# Patient Record
Sex: Male | Born: 1935 | Race: White | Hispanic: No | Smoking: Former smoker
Health system: Southern US, Community
[De-identification: ages and names within clinical notes are randomized; demographics above are authoritative.]

## PROBLEM LIST (undated history)

## (undated) DIAGNOSIS — Z7901 Long term (current) use of anticoagulants: Secondary | ICD-10-CM

## (undated) DIAGNOSIS — R221 Localized swelling, mass and lump, neck: Secondary | ICD-10-CM

## (undated) DIAGNOSIS — R0609 Other forms of dyspnea: Secondary | ICD-10-CM

## (undated) DIAGNOSIS — M1990 Unspecified osteoarthritis, unspecified site: Secondary | ICD-10-CM

## (undated) DIAGNOSIS — H548 Legal blindness, as defined in USA: Secondary | ICD-10-CM

## (undated) DIAGNOSIS — J45909 Unspecified asthma, uncomplicated: Secondary | ICD-10-CM

## (undated) DIAGNOSIS — I38 Endocarditis, valve unspecified: Secondary | ICD-10-CM

## (undated) DIAGNOSIS — I700 Atherosclerosis of aorta: Secondary | ICD-10-CM

## (undated) DIAGNOSIS — N281 Cyst of kidney, acquired: Secondary | ICD-10-CM

## (undated) DIAGNOSIS — E785 Hyperlipidemia, unspecified: Secondary | ICD-10-CM

## (undated) DIAGNOSIS — I739 Peripheral vascular disease, unspecified: Secondary | ICD-10-CM

## (undated) DIAGNOSIS — R001 Bradycardia, unspecified: Secondary | ICD-10-CM

## (undated) DIAGNOSIS — I779 Disorder of arteries and arterioles, unspecified: Secondary | ICD-10-CM

## (undated) DIAGNOSIS — I714 Abdominal aortic aneurysm, without rupture, unspecified: Secondary | ICD-10-CM

## (undated) DIAGNOSIS — I509 Heart failure, unspecified: Secondary | ICD-10-CM

## (undated) DIAGNOSIS — I639 Cerebral infarction, unspecified: Secondary | ICD-10-CM

## (undated) DIAGNOSIS — H409 Unspecified glaucoma: Secondary | ICD-10-CM

## (undated) DIAGNOSIS — C4492 Squamous cell carcinoma of skin, unspecified: Secondary | ICD-10-CM

## (undated) DIAGNOSIS — I2510 Atherosclerotic heart disease of native coronary artery without angina pectoris: Secondary | ICD-10-CM

## (undated) DIAGNOSIS — M5136 Other intervertebral disc degeneration, lumbar region: Secondary | ICD-10-CM

## (undated) DIAGNOSIS — J189 Pneumonia, unspecified organism: Secondary | ICD-10-CM

## (undated) DIAGNOSIS — K219 Gastro-esophageal reflux disease without esophagitis: Secondary | ICD-10-CM

## (undated) DIAGNOSIS — I10 Essential (primary) hypertension: Secondary | ICD-10-CM

## (undated) DIAGNOSIS — R0600 Dyspnea, unspecified: Secondary | ICD-10-CM

## (undated) DIAGNOSIS — M51369 Other intervertebral disc degeneration, lumbar region without mention of lumbar back pain or lower extremity pain: Secondary | ICD-10-CM

## (undated) HISTORY — DX: Essential (primary) hypertension: I10

## (undated) HISTORY — DX: Unspecified glaucoma: H40.9

## (undated) HISTORY — DX: Unspecified osteoarthritis, unspecified site: M19.90

## (undated) HISTORY — DX: Hyperlipidemia, unspecified: E78.5

## (undated) HISTORY — PX: EYE SURGERY: SHX253

## (undated) HISTORY — PX: TUMOR REMOVAL: SHX12

## (undated) HISTORY — PX: OTHER SURGICAL HISTORY: SHX169

## (undated) HISTORY — DX: Squamous cell carcinoma of skin, unspecified: C44.92

## (undated) HISTORY — DX: Cerebral infarction, unspecified: I63.9

---

## 1975-03-10 HISTORY — PX: TUMOR REMOVAL: SHX12

## 2001-03-09 HISTORY — PX: GLAUCOMA SURGERY: SHX656

## 2005-03-09 HISTORY — PX: INGUINAL HERNIA REPAIR: SUR1180

## 2006-05-08 HISTORY — PX: CERVICAL LAMINECTOMY: SHX94

## 2006-06-01 ENCOUNTER — Inpatient Hospital Stay (HOSPITAL_COMMUNITY): Admission: RE | Admit: 2006-06-01 | Discharge: 2006-06-04 | Payer: Self-pay | Admitting: Neurosurgery

## 2006-06-02 ENCOUNTER — Ambulatory Visit: Payer: Self-pay | Admitting: Thoracic Surgery

## 2006-06-03 DIAGNOSIS — K828 Other specified diseases of gallbladder: Secondary | ICD-10-CM

## 2006-06-03 HISTORY — DX: Other specified diseases of gallbladder: K82.8

## 2006-06-23 ENCOUNTER — Ambulatory Visit: Payer: Self-pay | Admitting: Thoracic Surgery

## 2006-07-01 ENCOUNTER — Ambulatory Visit (HOSPITAL_COMMUNITY): Admission: RE | Admit: 2006-07-01 | Discharge: 2006-07-01 | Payer: Self-pay | Admitting: Thoracic Surgery

## 2006-07-05 ENCOUNTER — Encounter (INDEPENDENT_AMBULATORY_CARE_PROVIDER_SITE_OTHER): Payer: Self-pay | Admitting: Diagnostic Radiology

## 2006-07-05 ENCOUNTER — Encounter (INDEPENDENT_AMBULATORY_CARE_PROVIDER_SITE_OTHER): Payer: Self-pay | Admitting: Specialist

## 2006-07-05 ENCOUNTER — Ambulatory Visit (HOSPITAL_COMMUNITY): Admission: RE | Admit: 2006-07-05 | Discharge: 2006-07-05 | Payer: Self-pay | Admitting: Thoracic Surgery

## 2006-07-07 ENCOUNTER — Ambulatory Visit: Payer: Self-pay | Admitting: Thoracic Surgery

## 2006-07-27 ENCOUNTER — Ambulatory Visit: Payer: Self-pay | Admitting: Thoracic Surgery

## 2006-08-03 ENCOUNTER — Encounter: Admission: RE | Admit: 2006-08-03 | Discharge: 2006-08-03 | Payer: Self-pay | Admitting: Thoracic Surgery

## 2006-08-04 ENCOUNTER — Ambulatory Visit: Payer: Self-pay | Admitting: Thoracic Surgery

## 2006-08-12 ENCOUNTER — Ambulatory Visit (HOSPITAL_COMMUNITY): Admission: RE | Admit: 2006-08-12 | Discharge: 2006-08-12 | Payer: Self-pay | Admitting: Neurosurgery

## 2009-07-05 ENCOUNTER — Inpatient Hospital Stay: Payer: Self-pay | Admitting: Vascular Surgery

## 2009-07-07 ENCOUNTER — Ambulatory Visit: Payer: Self-pay | Admitting: Internal Medicine

## 2009-07-08 ENCOUNTER — Ambulatory Visit: Payer: Self-pay | Admitting: Internal Medicine

## 2009-07-19 ENCOUNTER — Emergency Department: Payer: Self-pay | Admitting: Emergency Medicine

## 2009-07-22 ENCOUNTER — Ambulatory Visit: Payer: Self-pay | Admitting: Oncology

## 2009-07-26 ENCOUNTER — Ambulatory Visit: Payer: Self-pay | Admitting: Oncology

## 2009-08-07 ENCOUNTER — Ambulatory Visit: Payer: Self-pay | Admitting: Internal Medicine

## 2009-08-09 ENCOUNTER — Ambulatory Visit: Payer: Self-pay | Admitting: Vascular Surgery

## 2009-08-14 ENCOUNTER — Ambulatory Visit: Payer: Self-pay | Admitting: Oncology

## 2009-08-21 ENCOUNTER — Inpatient Hospital Stay: Payer: Self-pay | Admitting: Vascular Surgery

## 2010-02-13 ENCOUNTER — Emergency Department: Payer: Self-pay | Admitting: Emergency Medicine

## 2010-02-14 ENCOUNTER — Emergency Department: Payer: Self-pay | Admitting: Emergency Medicine

## 2010-02-16 ENCOUNTER — Emergency Department: Payer: Self-pay | Admitting: Unknown Physician Specialty

## 2010-02-17 ENCOUNTER — Emergency Department: Payer: Self-pay | Admitting: Emergency Medicine

## 2010-07-22 NOTE — Letter (Signed)
Aug 04, 2006     Re:  Steve Bryant, Steve Bryant             DOB:  1925-12-16     Dr. Dorothey Baseman  [address]  [place], [place] [address]   Dear Dr. Terance Hart:   I saw Steve Bryant back in the office today.  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  There is no spinal cord impingement  at present.  There is another small lesion just inferior to the large  lesion.This was all evidenced on the MRI.  I am afraid he is going to  have to have resective surgery of this neurilemoma.  I will contact his  neurosurgeon, Dr. Jeral Fruit, and have him review the films.  He still is  weak from his previous neck surgery so I will plan to wait at least  another month before proceeding with surgery.With the neuroforamen  impingement and possible spinal cord compromise, I do not see any other  option but to repeat his surgery despite his age.  I will see him back  again in one month.  His blood pressure is 179/80.  Pulse 59.  Respirations 18.  Saturations were 98%.   Ines Bloomer, M.D.  Electronically Signed   DPB/MEDQ  D:  08/04/2006  T:  08/04/2006  Job:  [id number]   cc:   Hilda Lias, M.D.

## 2010-07-25 NOTE — Discharge Summary (Signed)
NAME:  Steve Bryant, Steve Bryant NO.:  [id number]   MEDICAL RECORD NO.:  [id number]          PATIENT TYPE:  INP   LOCATION:  3005                         FACILITY:  [facility]   PHYSICIAN:  Hilda Lias, M.D.   DATE OF BIRTH:  18-Mar-1925   DATE OF ADMISSION:  06/01/2006  DATE OF DISCHARGE:  06/04/2006                               DISCHARGE SUMMARY   ADMISSION DIAGNOSES:  1. Cervical stenosis with myelopathy.  2. Lung tumor.   DISCHARGE DIAGNOSES:  1. Cervical stenosis with myelopathy.  2. Lung tumor.   CLINICAL HISTORY:  Patient was seen in [place] after he developed  weakness walking.  X-ray showed that he has severe stenosis at the level  of C3, 4-5, 5-6, 6-7.  Patient was advised to have surgery and he  decided to come to [facility].  Also, he was found to have a mass in  the lung.   LABORATORY:  Normal.   COURSE IN THE HOSPITAL:  Patient was taken to surgery and decompression  and fusion from C3-C7 was done.  Patient did really well after the  surgery up to the point of right now have no pain whatsoever.  He had  been seen by Dr. Edwyna Shell and now he is going to followup because of the  chest tumor.  Today, he is ready to go home.   CONDITION ON DISCHARGE:  Improving.   MEDICATIONS:  Diazepam and Percocet.   DIET:  Regular.   ACTIVITY:  Not to drive for at least 2 weeks.   FOLLOWUP:  He is going to be seen by me in 3 weeks.  He has an  outpatient appointment with Dr. Edwyna Shell.           ______________________________  Hilda Lias, M.D.     EB/MEDQ  D:  06/04/2006  T:  06/04/2006  Job:  [id number]

## 2010-07-25 NOTE — Op Note (Signed)
NAME:  Steve Bryant, STUCK NO.:  [id number]   MEDICAL RECORD NO.:  [id number]          PATIENT TYPE:  INP   LOCATION:  2899                         FACILITY:  [facility]   PHYSICIAN:  Hilda Lias, M.D.   DATE OF BIRTH:  May 05, 1925   DATE OF PROCEDURE:  06/01/2006  DATE OF DISCHARGE:                               OPERATIVE REPORT   PREOPERATIVE DIAGNOSIS:  Cervical stenosis C3-4, 4-5, 5-6 and 6-7 with  cervical myelopathy.   POSTOPERATIVE DIAGNOSES:  Cervical stenosis C3-4, 4-5, 5-6 and 6-7 with  cervical myelopathy.   PROCEDURE:  Decompression of 3-4, 4-5, 5-6 and 6-7.  Interbody fusion  with allograft.  Plate from C3 to C7.  Microscope.   SURGEON:  Hilda Lias, M.D.   ASSISTANT:  Hewitt Shorts, M.D.    The patient was admitted because of difficulty walking.  The patient had  this episode while he was in [place].  He was seen at the local hospital  and he was advised about the cervical stenosis and the need for surgery.  Because he did not it, he decided to come to [place].  The risks were  explained to him and the rest of the family.   PROCEDURE:  [name] was taken to the OR and intubation was done in  lateral position.  The neck was cleaned with DuraPrep.  Longitudinal  incision was made through the skin and subcutaneous tissue.  Dissection  was carried out all the way down to the cervical area.  The patient had  large osteophytes which were removed with the osteophyte __________ .  X-  rays showed that we were at the level of 4-5.  From then on we opened  the anterior ligament at the level of 3-4 and 4-5.  The __________ one  was at the level of 5-6 and 6-7 where the ligament was calcified.  Nevertheless because it probably was mostly at the level of 3-4 and 4-5  we started at that level.  With the microscope we did a total  diskectomy.  We opened the posterior ligament and decompressed the  spinal cord as well at the C4 nerve root was achieved  bilaterally.  The  endplate were drilled.  The same procedure was done at the level of 4-5  where we found a combination of degenerative disk plus osteophyte.  Decompression of the spinal cord as well C5 nerve root was achieved.  At  the level of 5-6 and 6-7 the patient has calcification of the ligament.  We had to drill to get into the disk space.  The patient had almost no  disk at the level.  At the end we had good decompression of the spinal  cord at the level of 5-6 and 6-7 with good foramen.  Then one piece of  iliac crest was cut to tailor for graft about 7 mm in height.  They were  inserted at those four levels and this was followed by 10 screws.  Four  of them of 15 mm at the level of C3 and C7 and the rest 13 mm in  between.  X-rays showed good position of the graft and the plate at the  level of 3-4 and 4-5 although was difficult to see below.  From then on,  the area was irrigated.  Hemostasis was accomplished.  Nevertheless  because of the larger dissection, a drain was left in the precervical  area.  The wound was closed with Vicryl and Steri-Strips.          ______________________________  Hilda Lias, M.D.    EB/MEDQ  D:  06/01/2006  T:  06/01/2006  Job:  [id number]

## 2012-01-18 ENCOUNTER — Emergency Department: Payer: Self-pay | Admitting: Emergency Medicine

## 2012-02-27 ENCOUNTER — Emergency Department: Payer: Self-pay | Admitting: Emergency Medicine

## 2012-02-27 LAB — RAPID INFLUENZA A&B ANTIGENS

## 2013-05-18 DIAGNOSIS — N289 Disorder of kidney and ureter, unspecified: Secondary | ICD-10-CM | POA: Insufficient documentation

## 2013-05-18 DIAGNOSIS — J45909 Unspecified asthma, uncomplicated: Secondary | ICD-10-CM | POA: Insufficient documentation

## 2013-05-18 DIAGNOSIS — M1990 Unspecified osteoarthritis, unspecified site: Secondary | ICD-10-CM | POA: Insufficient documentation

## 2013-05-18 DIAGNOSIS — G629 Polyneuropathy, unspecified: Secondary | ICD-10-CM | POA: Insufficient documentation

## 2013-05-18 DIAGNOSIS — G609 Hereditary and idiopathic neuropathy, unspecified: Secondary | ICD-10-CM | POA: Insufficient documentation

## 2013-05-18 DIAGNOSIS — M47812 Spondylosis without myelopathy or radiculopathy, cervical region: Secondary | ICD-10-CM | POA: Insufficient documentation

## 2013-08-21 ENCOUNTER — Ambulatory Visit: Payer: Self-pay | Admitting: Physical Medicine and Rehabilitation

## 2013-10-02 DIAGNOSIS — M5416 Radiculopathy, lumbar region: Secondary | ICD-10-CM | POA: Insufficient documentation

## 2013-10-02 DIAGNOSIS — M5136 Other intervertebral disc degeneration, lumbar region: Secondary | ICD-10-CM | POA: Insufficient documentation

## 2013-11-22 DIAGNOSIS — Z8709 Personal history of other diseases of the respiratory system: Secondary | ICD-10-CM | POA: Insufficient documentation

## 2013-11-22 DIAGNOSIS — I495 Sick sinus syndrome: Secondary | ICD-10-CM | POA: Insufficient documentation

## 2016-03-10 ENCOUNTER — Other Ambulatory Visit: Payer: Self-pay | Admitting: Family Medicine

## 2016-03-10 DIAGNOSIS — Z8679 Personal history of other diseases of the circulatory system: Secondary | ICD-10-CM

## 2016-03-12 ENCOUNTER — Ambulatory Visit
Admission: RE | Admit: 2016-03-12 | Discharge: 2016-03-12 | Disposition: A | Discharge status: Home / Self Care | Payer: Medicare Other | Source: Ambulatory Visit | Attending: Family Medicine | Admitting: Family Medicine

## 2016-03-12 DIAGNOSIS — K8020 Calculus of gallbladder without cholecystitis without obstruction: Secondary | ICD-10-CM | POA: Diagnosis not present

## 2016-03-12 DIAGNOSIS — N281 Cyst of kidney, acquired: Secondary | ICD-10-CM | POA: Diagnosis not present

## 2016-03-12 DIAGNOSIS — I714 Abdominal aortic aneurysm, without rupture: Secondary | ICD-10-CM | POA: Insufficient documentation

## 2016-03-12 DIAGNOSIS — Z8679 Personal history of other diseases of the circulatory system: Secondary | ICD-10-CM | POA: Diagnosis present

## 2016-08-26 DIAGNOSIS — R2681 Unsteadiness on feet: Secondary | ICD-10-CM | POA: Insufficient documentation

## 2016-09-18 ENCOUNTER — Encounter (INDEPENDENT_AMBULATORY_CARE_PROVIDER_SITE_OTHER): Payer: Self-pay | Admitting: Vascular Surgery

## 2016-09-18 ENCOUNTER — Ambulatory Visit (INDEPENDENT_AMBULATORY_CARE_PROVIDER_SITE_OTHER): Payer: Medicare Other | Admitting: Vascular Surgery

## 2016-09-18 VITALS — BP 125/70 | HR 54 | Resp 16 | Ht 69.5 in | Wt 150.0 lb

## 2016-09-18 DIAGNOSIS — I714 Abdominal aortic aneurysm, without rupture, unspecified: Secondary | ICD-10-CM

## 2016-09-18 DIAGNOSIS — I639 Cerebral infarction, unspecified: Secondary | ICD-10-CM

## 2016-09-18 DIAGNOSIS — I10 Essential (primary) hypertension: Secondary | ICD-10-CM | POA: Diagnosis not present

## 2016-09-18 NOTE — Assessment & Plan Note (Signed)
CT scan performed in [facility] which I have the disc and have independently reviewed. This demonstrates an approximately 4.5 cm infrarenal abdominal aortic aneurysm. This was a noncontrasted CT scan so its visualization was quite limited. The aneurysm was very calcific and begin several centimeters below the renal arteries. We've had a long discussion about the natural history and pathophysiology of aneurysms. Particularly given his advanced age, this is below the threshold size for prophylactic repair. It does however given the range where close surveillance is necessary and given what appears to be a recent growth a short interval follow-up at 3 months as planned. I have discussed repair both endovascular and open options. Given his age, an endovascular option would obviously be preferred if this grows to a size that needs to be repaired. He does not smoke and we have discussed blood pressure control is being important.

## 2016-09-18 NOTE — Assessment & Plan Note (Signed)
Remote, no recent symptoms.

## 2016-09-18 NOTE — Progress Notes (Signed)
Patient ID: Steve Bryant, male   DOB: 1925/06/29, 83 y.o.   MRN: [id number]  Chief Complaint  Patient presents with  . New Evaluation    AAA    HPI Steve Bryant is a 83 y.o. male.  I am asked to see the patient by Dr. Lovie Macadamia for evaluation of AAA.  The patient reports knowing about the AAA for 5 years or so.  This had been followed by his primary care physician but he recently had a CT scan performed in [facility] which I have the disc and have independently reviewed. This demonstrates an approximately 4.5 cm infrarenal abdominal aortic aneurysm. This was a noncontrasted CT scan so its visualization was quite limited. The aneurysm was very calcific and begin several centimeters below the renal arteries. He denies any current aneurysm related symptoms. Specifically, the patient denies new back or abdominal pain, or signs of peripheral embolization. He does not smoke. His biggest issue currently is his balance and his vision which is very poor. He reports that he was told for many years that the aneurysm was "small" and this recent CT scan showed some increase in size. He is not sure how much.   Past Medical History:  Diagnosis Date  . Arthritis   . Glaucoma   . Hypertension   . Stroke [facility])     Past Surgical History:  Procedure Laterality Date  . EYE SURGERY    . neck fusion    . TUMOR REMOVAL      Family History  Problem Relation Age of Onset  . Stroke Mother   . Stroke Father   . Cancer Brother   No aneurysms or bleeding disorders  Social History Social History  Substance Use Topics  . Smoking status: Former Research scientist (life sciences)  . Smokeless tobacco: Never Used  . Alcohol use Yes     Comment: occasionally  No IVDU  Allergies  Allergen Reactions  . Shellfish-Derived Products Nausea Only    Scallops    Current Outpatient Prescriptions  Medication Sig Dispense Refill  . amLODipine (NORVASC) 2.5 MG tablet Take 2.5 mg by mouth daily.  3  . aspirin EC 81 MG tablet  Take by mouth.    . dorzolamide-timolol (COSOPT) 22.3-6.8 MG/ML ophthalmic solution INSTILL ONE DROP TWICE DAILY TO BOTH EYES  2  . latanoprost (XALATAN) 0.005 % ophthalmic solution USE 1 DROP IN THE RIGHT EYE AT BEDTIME AS DIRECTED  2  . losartan (COZAAR) 50 MG tablet TAKE 1 TABLET (50 MG TOTAL) BY MOUTH ONCE DAILY.  3  . LYRICA 75 MG capsule Take 75 mg by mouth 3 (three) times daily.  0  . Multiple Vitamin (MULTIVITAMIN) tablet Take 1 tablet by mouth daily.     No current facility-administered medications for this visit.       REVIEW OF SYSTEMS (Negative unless checked)  Constitutional: [] Weight loss  [] Fever  [] Chills Cardiac: [] Chest pain   [] Chest pressure   [] Palpitations   [] Shortness of breath when laying flat   [] Shortness of breath at rest   [] Shortness of breath with exertion. Vascular:  [] Pain in legs with walking   [] Pain in legs at rest   [] Pain in legs when laying flat   [] Claudication   [] Pain in feet when walking  [] Pain in feet at rest  [] Pain in feet when laying flat   [] History of DVT   [] Phlebitis   [] Swelling in legs   [] Varicose veins   [] Non-healing ulcers Pulmonary:   []   Uses home oxygen   [] Productive cough   [] Hemoptysis   [] Wheeze  [] COPD   [] Asthma Neurologic:  [] Dizziness  [] Blackouts   [] Seizures   [x] History of stroke   [] History of TIA  [] Aphasia   [] Temporary blindness   [] Dysphagia   [] Weakness or numbness in arms   [] Weakness or numbness in legs X positive for markedly diminished vision Musculoskeletal:  [x] Arthritis   [] Joint swelling   [] Joint pain   [] Low back pain Hematologic:  [] Easy bruising  [] Easy bleeding   [] Hypercoagulable state   [] Anemic  [] Hepatitis Gastrointestinal:  [] Blood in stool   [] Vomiting blood  [] Gastroesophageal reflux/heartburn   [] Abdominal pain Genitourinary:  [] Chronic kidney disease   [] Difficult urination  [] Frequent urination  [] Burning with urination   [] Hematuria Skin:  [] Rashes   [] Ulcers   [] Wounds Psychological:   [] History of anxiety   []  History of major depression.    Physical Exam BP 125/70   Pulse (!) 54   Resp 16   Ht 5' 9.5" (1.765 m)   Wt 150 lb (68 kg)   BMI 21.83 kg/m  Gen:  WD/WN, NAD Head: [place]/AT, No temporalis wasting.  Ear/Nose/Throat: Hearing grossly intact, nares w/o erythema or drainage, oropharynx w/o Erythema/Exudate Eyes: Conjunctiva clear, sclera non-icteric. Visual acuity is poor Neck: trachea midline.  No JVD.  Pulmonary:  Good air movement, clear to auscultation bilaterally.  Cardiac: RRR Vascular:  Vessel Right Left  Radial Palpable Palpable  Ulnar Palpable Palpable  Brachial Palpable Palpable  Carotid Palpable, without bruit Palpable, without bruit  Aorta Palpable N/A  Femoral Palpable Palpable  Popliteal 1+ Palpable 1+ Palpable  PT Palpable Not Palpable  DP 1+ Palpable Palpable   Gastrointestinal: soft, non-tender/non-distended. No guarding/reflex. Some increased aortic impulse is present Musculoskeletal: M/S 5/5 throughout.  Extremities without ischemic changes.  No deformity or atrophy. Neurologic: Sensation grossly intact in extremities.  Symmetrical.  Speech is fluent. Motor exam as listed above. Psychiatric: Judgment intact, Mood & affect appropriate for pt's clinical situation. Dermatologic: No rashes or ulcers noted.  No cellulitis or open wounds.   Data Reviewed Outside CT scan independently reviewed and does demonstrate a 4.5 cm aneurysm  Radiology No results found.  Labs No results found for this or any previous visit (from the past 2160 hour(s)).  Assessment/Plan:  Essential hypertension, benign blood pressure control important in reducing the progression of atherosclerotic disease and aneurysmal degeneration. On appropriate oral medications.   Stroke [facility]) Remote, no recent symptoms.  AAA (abdominal aortic aneurysm) without rupture [facility]) CT scan performed in [facility] which I have the disc and have independently reviewed. This  demonstrates an approximately 4.5 cm infrarenal abdominal aortic aneurysm. This was a noncontrasted CT scan so its visualization was quite limited. The aneurysm was very calcific and begin several centimeters below the renal arteries. We've had a long discussion about the natural history and pathophysiology of aneurysms. Particularly given his advanced age, this is below the threshold size for prophylactic repair. It does however given the range where close surveillance is necessary and given what appears to be a recent growth a short interval follow-up at 3 months as planned. I have discussed repair both endovascular and open options. Given his age, an endovascular option would obviously be preferred if this grows to a size that needs to be repaired. He does not smoke and we have discussed blood pressure control is being important.      Leotis Pain 09/18/2016, 11:08 AM   This note was created  with Dragon medical transcription system.  Any errors from dictation are unintentional.

## 2016-09-18 NOTE — Assessment & Plan Note (Signed)
blood pressure control important in reducing the progression of atherosclerotic disease and aneurysmal degeneration. On appropriate oral medications.  

## 2016-09-18 NOTE — Patient Instructions (Signed)
Abdominal Aortic Aneurysm Blood pumps away from the heart through tubes (blood vessels) called arteries. Aneurysms are weak or damaged places in the wall of an artery. It bulges out like a balloon. An abdominal aortic aneurysm happens in the main artery of the body (aorta). It can burst or tear, causing bleeding inside the body. This is an emergency. It needs treatment right away. What are the causes? The exact cause is unknown. Things that could cause this problem include:  Fat and other substances building up in the lining of a tube.  Swelling of the walls of a blood vessel.  Certain tissue diseases.  Belly (abdominal) trauma.  An infection in the main artery of the body.  What increases the risk? There are things that make it more likely for you to have an aneurysm. These include:  Being over the age of 81 years old.  Having high blood pressure (hypertension).  Being a male.  Being white.  Being very overweight (obese).  Having a family history of aneurysm.  Using tobacco products.  What are the signs or symptoms? Symptoms depend on the size of the aneurysm and how fast it grows. There may not be symptoms. If symptoms occur, they can include:  Pain (belly, side, lower back, or groin).  Feeling full after eating a small amount of food.  Feeling sick to your stomach (nauseous), throwing up (vomiting), or both.  Feeling a lump in your belly that feels like it is beating (pulsating).  Feeling like you will pass out (faint).  How is this treated?  Medicine to control blood pressure and pain.  Imaging tests to see if the aneurysm gets bigger.  Surgery. How is this prevented? To lessen your chance of getting this condition:  Stop smoking. Stop chewing tobacco.  Limit or avoid alcohol.  Keep your blood pressure, blood sugar, and cholesterol within normal limits.  Eat less salt.  Eat foods low in saturated fats and cholesterol. These are found in animal and  whole dairy products.  Eat more fiber. Fiber is found in whole grains, vegetables, and fruits.  Keep a healthy weight.  Stay active and exercise often.  This information is not intended to replace advice given to you by your health care provider. Make sure you discuss any questions you have with your health care provider. Document Released: 06/20/2012 Document Revised: 08/01/2015 Document Reviewed: 03/25/2012 Elsevier Interactive Patient Education  2017 [employer] Inc.  

## 2016-10-15 ENCOUNTER — Emergency Department: Payer: Medicare Other

## 2016-10-15 ENCOUNTER — Encounter: Payer: Self-pay | Admitting: Medical Oncology

## 2016-10-15 ENCOUNTER — Inpatient Hospital Stay (HOSPITAL_COMMUNITY)
Admission: EM | Admit: 2016-10-15 | Discharge: 2016-10-16 | Disposition: A | Discharge status: Home / Self Care | Payer: Medicare Other | Source: Home / Self Care | Attending: Internal Medicine | Admitting: Internal Medicine

## 2016-10-15 ENCOUNTER — Inpatient Hospital Stay: Payer: Medicare Other

## 2016-10-15 DIAGNOSIS — Z981 Arthrodesis status: Secondary | ICD-10-CM

## 2016-10-15 DIAGNOSIS — R471 Dysarthria and anarthria: Secondary | ICD-10-CM | POA: Diagnosis present

## 2016-10-15 DIAGNOSIS — R1310 Dysphagia, unspecified: Secondary | ICD-10-CM | POA: Diagnosis present

## 2016-10-15 DIAGNOSIS — Z79899 Other long term (current) drug therapy: Secondary | ICD-10-CM

## 2016-10-15 DIAGNOSIS — I6329 Cerebral infarction due to unspecified occlusion or stenosis of other precerebral arteries: Principal | ICD-10-CM | POA: Diagnosis present

## 2016-10-15 DIAGNOSIS — R29810 Facial weakness: Secondary | ICD-10-CM | POA: Diagnosis present

## 2016-10-15 DIAGNOSIS — I10 Essential (primary) hypertension: Secondary | ICD-10-CM | POA: Diagnosis present

## 2016-10-15 DIAGNOSIS — H547 Unspecified visual loss: Secondary | ICD-10-CM | POA: Diagnosis present

## 2016-10-15 DIAGNOSIS — G8191 Hemiplegia, unspecified affecting right dominant side: Secondary | ICD-10-CM | POA: Diagnosis present

## 2016-10-15 DIAGNOSIS — Z8673 Personal history of transient ischemic attack (TIA), and cerebral infarction without residual deficits: Secondary | ICD-10-CM

## 2016-10-15 DIAGNOSIS — Z91013 Allergy to seafood: Secondary | ICD-10-CM

## 2016-10-15 DIAGNOSIS — Z87891 Personal history of nicotine dependence: Secondary | ICD-10-CM

## 2016-10-15 DIAGNOSIS — R531 Weakness: Secondary | ICD-10-CM | POA: Diagnosis present

## 2016-10-15 DIAGNOSIS — G8194 Hemiplegia, unspecified affecting left nondominant side: Secondary | ICD-10-CM | POA: Diagnosis present

## 2016-10-15 DIAGNOSIS — K219 Gastro-esophageal reflux disease without esophagitis: Secondary | ICD-10-CM | POA: Diagnosis present

## 2016-10-15 DIAGNOSIS — H409 Unspecified glaucoma: Secondary | ICD-10-CM | POA: Diagnosis present

## 2016-10-15 DIAGNOSIS — I739 Peripheral vascular disease, unspecified: Secondary | ICD-10-CM | POA: Diagnosis present

## 2016-10-15 DIAGNOSIS — I714 Abdominal aortic aneurysm, without rupture: Secondary | ICD-10-CM | POA: Diagnosis present

## 2016-10-15 DIAGNOSIS — Z823 Family history of stroke: Secondary | ICD-10-CM

## 2016-10-15 DIAGNOSIS — M1990 Unspecified osteoarthritis, unspecified site: Secondary | ICD-10-CM | POA: Diagnosis present

## 2016-10-15 DIAGNOSIS — R2681 Unsteadiness on feet: Secondary | ICD-10-CM | POA: Diagnosis present

## 2016-10-15 DIAGNOSIS — E785 Hyperlipidemia, unspecified: Secondary | ICD-10-CM | POA: Diagnosis present

## 2016-10-15 DIAGNOSIS — I639 Cerebral infarction, unspecified: Secondary | ICD-10-CM

## 2016-10-15 DIAGNOSIS — Z7982 Long term (current) use of aspirin: Secondary | ICD-10-CM

## 2016-10-15 HISTORY — DX: Abdominal aortic aneurysm, without rupture: I71.4

## 2016-10-15 HISTORY — DX: Abdominal aortic aneurysm, without rupture, unspecified: I71.40

## 2016-10-15 LAB — COMPREHENSIVE METABOLIC PANEL
ALT: 13 U/L — ABNORMAL LOW (ref 17–63)
AST: 24 U/L (ref 15–41)
Albumin: 3.8 g/dL (ref 3.5–5.0)
Alkaline Phosphatase: 54 U/L (ref 38–126)
Anion gap: 7 (ref 5–15)
BUN: 20 mg/dL (ref 6–20)
CO2: 27 mmol/L (ref 22–32)
Calcium: 9.4 mg/dL (ref 8.9–10.3)
Chloride: 110 mmol/L (ref 101–111)
Creatinine, Ser: 1.16 mg/dL (ref 0.61–1.24)
GFR calc Af Amer: >60 mL/min (ref >60)
GFR calc non Af Amer: 54 mL/min — ABNORMAL LOW (ref >60)
Glucose, Bld: 106 mg/dL — ABNORMAL HIGH (ref 65–99)
Potassium: 4.1 mmol/L (ref 3.5–5.1)
Sodium: 144 mmol/L (ref 135–145)
Total Bilirubin: 0.9 mg/dL (ref 0.3–1.2)
Total Protein: 6.6 g/dL (ref 6.5–8.1)

## 2016-10-15 LAB — DIFFERENTIAL
Basophils Absolute: 0.1 10*3/uL (ref 0–0.1)
Basophils Relative: 1 %
Eosinophils Absolute: 0.1 10*3/uL (ref 0–0.7)
Eosinophils Relative: 2 %
Lymphocytes Relative: 22 %
Lymphs Abs: 2 10*3/uL (ref 1.0–3.6)
Monocytes Absolute: 1.1 10*3/uL — ABNORMAL HIGH (ref 0.2–1.0)
Monocytes Relative: 12 %
Neutro Abs: 5.7 10*3/uL (ref 1.4–6.5)
Neutrophils Relative %: 63 %

## 2016-10-15 LAB — CBC
HCT: 44.5 % (ref 40.0–52.0)
Hemoglobin: 14.5 g/dL (ref 13.0–18.0)
MCH: 28.6 pg (ref 26.0–34.0)
MCHC: 32.6 g/dL (ref 32.0–36.0)
MCV: 87.7 fL (ref 80.0–100.0)
Platelets: 231 10*3/uL (ref 150–440)
RBC: 5.07 MIL/uL (ref 4.40–5.90)
RDW: 15.5 % — ABNORMAL HIGH (ref 11.5–14.5)
WBC: 9.1 10*3/uL (ref 3.8–10.6)

## 2016-10-15 LAB — PROTIME-INR
INR: 1.04
Prothrombin Time: 13.6 seconds (ref 11.4–15.2)

## 2016-10-15 LAB — TROPONIN I: Troponin I: <0.03 ng/mL (ref <0.03)

## 2016-10-15 LAB — APTT: aPTT: 28 seconds (ref 24–36)

## 2016-10-15 LAB — GLUCOSE, CAPILLARY: Glucose-Capillary: 77 mg/dL (ref 65–99)

## 2016-10-15 MED ORDER — ENOXAPARIN SODIUM 40 MG/0.4ML ~~LOC~~ SOLN
40.0000 mg | SUBCUTANEOUS | Status: DC
  Administered 2016-10-15: 20:00:00 40 mg via SUBCUTANEOUS
  Filled 2016-10-15: qty 0.4

## 2016-10-15 MED ORDER — DORZOLAMIDE HCL-TIMOLOL MAL PF 22.3-6.8 MG/ML OP SOLN
1.0000 [drp] | Freq: Two times a day (BID) | OPHTHALMIC | Status: DC
  Administered 2016-10-15 – 2016-10-16 (×2): 1 [drp] via OPHTHALMIC
  Filled 2016-10-15: qty 0.1

## 2016-10-15 MED ORDER — POLYETHYLENE GLYCOL 3350 17 G PO PACK: 17.0000 g | PACK | Freq: Every day | ORAL | Status: DC | PRN

## 2016-10-15 MED ORDER — ASPIRIN EC 81 MG PO TBEC
81.0000 mg | DELAYED_RELEASE_TABLET | Freq: Every day | ORAL | Status: DC
  Administered 2016-10-15 – 2016-10-16 (×2): 81 mg via ORAL
  Filled 2016-10-15 (×2): qty 1

## 2016-10-15 MED ORDER — AMLODIPINE BESYLATE 5 MG PO TABS: 2.5000 mg | ORAL_TABLET | Freq: Every day | ORAL | Status: DC

## 2016-10-15 MED ORDER — SODIUM CHLORIDE 0.9 % IV SOLN: INTRAVENOUS | Status: DC

## 2016-10-15 MED ORDER — ONDANSETRON HCL 4 MG PO TABS: 4.0000 mg | ORAL_TABLET | Freq: Four times a day (QID) | ORAL | Status: DC | PRN

## 2016-10-15 MED ORDER — LOSARTAN POTASSIUM 25 MG PO TABS
25.0000 mg | ORAL_TABLET | Freq: Every day | ORAL | Status: DC
  Administered 2016-10-15: 25 mg via ORAL
  Filled 2016-10-15: qty 1

## 2016-10-15 MED ORDER — CLOPIDOGREL BISULFATE 75 MG PO TABS
75.0000 mg | ORAL_TABLET | Freq: Once | ORAL | Status: AC
  Administered 2016-10-15: 17:00:00 75 mg via ORAL
  Filled 2016-10-15: qty 1

## 2016-10-15 MED ORDER — DORZOLAMIDE HCL 2 % OP SOLN
1.0000 [drp] | Freq: Every day | OPHTHALMIC | Status: DC
  Filled 2016-10-15: qty 10

## 2016-10-15 MED ORDER — ACETAMINOPHEN 325 MG PO TABS: 650.0000 mg | ORAL_TABLET | Freq: Four times a day (QID) | ORAL | Status: DC | PRN

## 2016-10-15 MED ORDER — GADOBENATE DIMEGLUMINE 529 MG/ML IV SOLN
14.0000 mL | Freq: Once | INTRAVENOUS | Status: AC | PRN
  Administered 2016-10-15: 14 mL via INTRAVENOUS

## 2016-10-15 MED ORDER — CLOPIDOGREL BISULFATE 75 MG PO TABS: 75.0000 mg | ORAL_TABLET | Freq: Every day | ORAL | Status: DC

## 2016-10-15 MED ORDER — ONDANSETRON HCL 4 MG/2ML IJ SOLN: 4.0000 mg | Freq: Four times a day (QID) | INTRAMUSCULAR | Status: DC | PRN

## 2016-10-15 MED ORDER — LOSARTAN POTASSIUM 25 MG PO TABS: 25.0000 mg | ORAL_TABLET | Freq: Every day | ORAL | Status: DC

## 2016-10-15 MED ORDER — MELATONIN 5 MG PO TABS
10.0000 mg | ORAL_TABLET | Freq: Every day | ORAL | Status: DC
  Administered 2016-10-15: 10 mg via ORAL
  Filled 2016-10-15 (×2): qty 2

## 2016-10-15 MED ORDER — ADULT MULTIVITAMIN W/MINERALS CH
1.0000 | ORAL_TABLET | Freq: Every day | ORAL | Status: DC
  Administered 2016-10-15 – 2016-10-16 (×2): 1 via ORAL
  Filled 2016-10-15 (×2): qty 1

## 2016-10-15 MED ORDER — LATANOPROST 0.005 % OP SOLN
1.0000 [drp] | Freq: Every day | OPHTHALMIC | Status: DC
  Administered 2016-10-15: 1 [drp] via OPHTHALMIC
  Filled 2016-10-15: qty 2.5

## 2016-10-15 MED ORDER — IOPAMIDOL (ISOVUE-370) INJECTION 76%
125.0000 mL | Freq: Once | INTRAVENOUS | Status: AC | PRN
  Administered 2016-10-15: 75 mL via INTRAVENOUS

## 2016-10-15 MED ORDER — PANTOPRAZOLE SODIUM 40 MG PO TBEC
40.0000 mg | DELAYED_RELEASE_TABLET | Freq: Every day | ORAL | Status: DC
  Administered 2016-10-15 – 2016-10-16 (×2): 40 mg via ORAL
  Filled 2016-10-15 (×2): qty 1

## 2016-10-15 MED ORDER — ACETAMINOPHEN 650 MG RE SUPP: 650.0000 mg | Freq: Four times a day (QID) | RECTAL | Status: DC | PRN

## 2016-10-15 MED ORDER — SODIUM CHLORIDE 0.9 % IV BOLUS (SEPSIS)
500.0000 mL | Freq: Once | INTRAVENOUS | Status: AC
  Administered 2016-10-15: 500 mL via INTRAVENOUS

## 2016-10-15 MED ORDER — PREGABALIN 75 MG PO CAPS
75.0000 mg | ORAL_CAPSULE | Freq: Three times a day (TID) | ORAL | Status: DC
  Administered 2016-10-15 – 2016-10-16 (×3): 75 mg via ORAL
  Filled 2016-10-15 (×3): qty 1

## 2016-10-15 MED ORDER — AMLODIPINE BESYLATE 5 MG PO TABS
2.5000 mg | ORAL_TABLET | Freq: Every day | ORAL | Status: DC
  Administered 2016-10-15: 2.5 mg via ORAL
  Filled 2016-10-15: qty 1

## 2016-10-15 NOTE — ED Notes (Signed)
On way back from CT pt feels like having difficulty getting words out and that is harder to talk than when he arrived in ED.

## 2016-10-15 NOTE — ED Notes (Signed)
First Nurse Note:  Weakness in legs and arms starting at 2230 last PM.

## 2016-10-15 NOTE — ED Notes (Signed)
Patient transported to CT 

## 2016-10-15 NOTE — ED Notes (Signed)
Pt currently in CT.

## 2016-10-15 NOTE — ED Notes (Addendum)
Pt had bilateral leg weakness starting last night before bed. This RN noted left facial droop. Asked granddaughter and she reports this is new since arriving to ED.  She feels his speech is slurred at times since about 730 this morning.  Pt feels like no difficulty talking. No slurred speech currently. Spoke with dr Clearnce Hasten, code stroke orders placed per his recommendations since sx seem to be worsening.

## 2016-10-15 NOTE — ED Notes (Signed)
SOC on screen for evaluation

## 2016-10-15 NOTE — ED Triage Notes (Signed)
Pt reports not feeling well all day yesterday and last night around 1030 he noted that he felt dizzy and weak all over. Pt reports bilt leg weakness. Denies pain. Speaking without difficulty.

## 2016-10-15 NOTE — H&P (Signed)
[facility] [place] [facility] NAME: Steve Bryant    MR#:  [id number]  DATE OF BIRTH:  [birth year]  DATE OF ADMISSION:  10/15/2016  PRIMARY CARE PHYSICIAN: [name], MD   REQUESTING/REFERRING PHYSICIAN: dr Clearnce Hasten  CHIEF COMPLAINT:  Bilateral lower extremity weakness upper extremity due to weakness and left facial droop  HISTORY OF PRESENT ILLNESS:  Steve Bryant  is a 83 y.o. male with a known history of Stroke, hypertension, glaucoma comes to the emergency room from home with complaints of weakness in both lower extremity upper extremity could not get out of bed to walk along with left facial weakness/droop. Family initially felt patient had slurred speech. Patient reports his weakness is much better. He had some difficulty swallowing earlier according to the nurse. He is being admitted for possible acute stroke. CT head shows chronic changes and previous chronic lacunar infarcts. CT angiogram of the neck shows atherosclerotic vascular disease no acute stenosis. Patient received aspirin and Plavix was added to his regimen. He is being admitted for further  management of stroke. PAST MEDICAL HISTORY:   Past Medical History:  Diagnosis Date  . AAA (abdominal aortic aneurysm) ([place])   . Arthritis   . Glaucoma   . Hypertension   . Stroke [facility])     PAST SURGICAL HISTOIRY:   Past Surgical History:  Procedure Laterality Date  . EYE SURGERY    . neck fusion    . TUMOR REMOVAL      SOCIAL HISTORY:   Social History  Substance Use Topics  . Smoking status: Former Research scientist (life sciences)  . Smokeless tobacco: Never Used  . Alcohol use Yes     Comment: occasionally    FAMILY HISTORY:   Family History  Problem Relation Age of Onset  . Stroke Mother   . Stroke Father   . Cancer Brother     DRUG ALLERGIES:   Allergies  Allergen Reactions  . Shellfish-Derived Products Nausea Only    Scallops    REVIEW OF SYSTEMS:  Review of  Systems  Constitutional: Negative for chills, fever and weight loss.  HENT: Negative for ear discharge, ear pain and nosebleeds.   Eyes: Negative for blurred vision, pain and discharge.  Respiratory: Negative for sputum production, shortness of breath, wheezing and stridor.   Cardiovascular: Negative for chest pain, palpitations, orthopnea and PND.  Gastrointestinal: Negative for abdominal pain, diarrhea, nausea and vomiting.  Genitourinary: Negative for frequency and urgency.  Musculoskeletal: Negative for back pain and joint pain.  Neurological: Positive for speech change, focal weakness and weakness. Negative for sensory change.  Psychiatric/Behavioral: Negative for depression and hallucinations. The patient is not nervous/anxious.      MEDICATIONS AT HOME:   Prior to Admission medications   Medication Sig Start Date End Date Taking? Authorizing Provider  amLODipine (NORVASC) 2.5 MG tablet Take 2.5 mg by mouth daily. 07/07/16  Yes [provider]  aspirin EC 81 MG tablet Take by mouth.   Yes [provider]  dorzolamide-timolol (COSOPT) 22.3-6.8 MG/ML ophthalmic solution INSTILL ONE DROP TWICE DAILY TO BOTH EYES 08/10/16  Yes [provider]  latanoprost (XALATAN) 0.005 % ophthalmic solution USE 1 DROP IN THE RIGHT EYE AT BEDTIME AS DIRECTED 09/03/16  Yes [provider]  losartan (COZAAR) 50 MG tablet TAKE 1 TABLET (50 MG TOTAL) BY MOUTH ONCE DAILY. 08/09/16  Yes [provider]  LYRICA 75 MG capsule Take 75 mg by mouth 3 (three) times daily. 07/10/16  Yes [provider]  Melatonin 10 MG TABS Take 1 tablet by mouth at bedtime.   Yes [provider]  Multiple Vitamin (MULTIVITAMIN) tablet Take 1 tablet by mouth daily.   Yes [provider]  omeprazole (PRILOSEC) 20 MG capsule Take 20 mg by mouth daily.   Yes [provider]      VITAL SIGNS:  Blood pressure (!) 165/79, pulse (!) 56, temperature (!) 97.4 F (36.3  C), temperature source Oral, resp. rate 18, height 5\' 10"  (1.778 m), weight 68 kg (150 lb), SpO2 98 %.  PHYSICAL EXAMINATION:  GENERAL:  83 y.o.-year-old patient lying in the bed with no acute distress.  EYES: Pupils equal, round, reactive to light and accommodation. No scleral icterus. Extraocular muscles intact.  HEENT: Head atraumatic, normocephalic. Oropharynx and nasopharynx clear.  NECK:  Supple, no jugular venous distention. No thyroid enlargement, no tenderness.  LUNGS: Normal breath sounds bilaterally, no wheezing, rales,rhonchi or crepitation. No use of accessory muscles of respiration.  CARDIOVASCULAR: S1, S2 normal. No murmurs, rubs, or gallops.  ABDOMEN: Soft, nontender, nondistended. Bowel sounds present. No organomegaly or mass.  EXTREMITIES: No pedal edema, cyanosis, or clubbing.  NEUROLOGIC: Cranial nerves II through XII are intact. Muscle strength 5/5 in all extremities. Sensation intact. Gait not checked. Mild facial droop PSYCHIATRIC: The patient is alert and oriented x 3.  SKIN: No obvious rash, lesion, or ulcer.   LABORATORY PANEL:   CBC  Recent Labs Lab 10/15/16 0919  WBC 9.1  HGB 14.5  HCT 44.5  PLT 231   ------------------------------------------------------------------------------------------------------------------  Chemistries   Recent Labs Lab 10/15/16 0919  NA 144  K 4.1  CL 110  CO2 27  GLUCOSE 106*  BUN 20  CREATININE 1.16  CALCIUM 9.4  AST 24  ALT 13*  ALKPHOS 54  BILITOT 0.9   ------------------------------------------------------------------------------------------------------------------  Cardiac Enzymes  Recent Labs Lab 10/15/16 0919  TROPONINI <0.03   ------------------------------------------------------------------------------------------------------------------  RADIOLOGY:  Ct Angio Head W Or Wo Contrast  Result Date: 10/15/2016 CLINICAL DATA:  Bilateral leg weakness. Arterial stricture/occlusion of head neck EXAM:  CT ANGIOGRAPHY HEAD AND NECK TECHNIQUE: Multidetector CT imaging of the head and neck was performed using the standard protocol during bolus administration of intravenous contrast. Multiplanar CT image reconstructions and MIPs were obtained to evaluate the vascular anatomy. Carotid stenosis measurements (when applicable) are obtained utilizing NASCET criteria, using the distal internal carotid diameter as the denominator. CONTRAST:  125 mL Isovue 370 IV COMPARISON:  CT head 10/15/2016.  PET-CT 07/05/2009 FINDINGS: CTA NECK FINDINGS Aortic arch: Extensive atherosclerotic calcification in the aortic arch and proximal great vessels Right carotid system: Mild thickening of the proximal internal carotid artery without significant stenosis. Left carotid system: Atherosclerotic calcification left common carotid artery. Extensive calcification at the carotid bifurcation. Moderate stenosis of the proximal left internal carotid artery due to calcific stenosis. Estimated 60% diameter stenosis. Vertebral arteries: Calcific stenosis at the origin of the vertebral artery bilaterally. Diffuse atherosclerotic disease throughout both vertebral arteries. Calcific stenosis distal left vertebral artery. Skeleton: No acute skeletal lesion. ACDF C3 through C7. Pseudarthrosis at C3-4. Solid fusion C4 through C7. Other neck: Largest soft tissue mass at the base of the neck on the right measures 4 x 5 cm. The lesion is similar in size to a CT neck of 07/04/2009. No other neck adenopathy Upper chest: Apical scarring on the right. Chronic right rib fractures with pleural scarring. Calcified pleural plaque left apex. Subpleural blebs on the left apex. Review of  the MIP images confirms the above findings CTA HEAD FINDINGS Anterior circulation: Severe atherosclerotic calcification in the cavernous carotid diffusely and bilaterally causing moderate bilateral stenosis. Anterior and middle cerebral artery is patent bilaterally without large vessel  occlusion or significant proximal stenosis. Posterior circulation: Moderate calcific stenosis distal left vertebral artery. Mild stenosis distal right vertebral artery. Basilar is mildly irregular but patent. Left PICA patent. Right AICA patent. Superior cerebellar and posterior cerebral artery is patent bilaterally without significant stenosis. Venous sinuses: Patent Anatomic variants: None Delayed phase: Not performed Review of the MIP images confirms the above findings IMPRESSION: Severe diffuse atherosclerotic disease. Probable right carotid endarterectomy without stenosis. 60% diameter stenosis proximal left internal carotid artery due to heavily calcified plaque Heavily calcified cavernous carotid bilaterally with moderate stenosis bilaterally. No significant intracranial stenosis. No large vessel occlusion 4 x 5 cm soft tissue mass at the base the neck on the right, similar to 2011. Electronically Signed   By: Franchot Gallo M.D.   On: 10/15/2016 11:33   Ct Angio Neck W And/or Wo Contrast  Result Date: 10/15/2016 CLINICAL DATA:  Bilateral leg weakness. Arterial stricture/occlusion of head neck EXAM: CT ANGIOGRAPHY HEAD AND NECK TECHNIQUE: Multidetector CT imaging of the head and neck was performed using the standard protocol during bolus administration of intravenous contrast. Multiplanar CT image reconstructions and MIPs were obtained to evaluate the vascular anatomy. Carotid stenosis measurements (when applicable) are obtained utilizing NASCET criteria, using the distal internal carotid diameter as the denominator. CONTRAST:  125 mL Isovue 370 IV COMPARISON:  CT head 10/15/2016.  PET-CT 07/05/2009 FINDINGS: CTA NECK FINDINGS Aortic arch: Extensive atherosclerotic calcification in the aortic arch and proximal great vessels Right carotid system: Mild thickening of the proximal internal carotid artery without significant stenosis. Left carotid system: Atherosclerotic calcification left common carotid  artery. Extensive calcification at the carotid bifurcation. Moderate stenosis of the proximal left internal carotid artery due to calcific stenosis. Estimated 60% diameter stenosis. Vertebral arteries: Calcific stenosis at the origin of the vertebral artery bilaterally. Diffuse atherosclerotic disease throughout both vertebral arteries. Calcific stenosis distal left vertebral artery. Skeleton: No acute skeletal lesion. ACDF C3 through C7. Pseudarthrosis at C3-4. Solid fusion C4 through C7. Other neck: Largest soft tissue mass at the base of the neck on the right measures 4 x 5 cm. The lesion is similar in size to a CT neck of 07/04/2009. No other neck adenopathy Upper chest: Apical scarring on the right. Chronic right rib fractures with pleural scarring. Calcified pleural plaque left apex. Subpleural blebs on the left apex. Review of the MIP images confirms the above findings CTA HEAD FINDINGS Anterior circulation: Severe atherosclerotic calcification in the cavernous carotid diffusely and bilaterally causing moderate bilateral stenosis. Anterior and middle cerebral artery is patent bilaterally without large vessel occlusion or significant proximal stenosis. Posterior circulation: Moderate calcific stenosis distal left vertebral artery. Mild stenosis distal right vertebral artery. Basilar is mildly irregular but patent. Left PICA patent. Right AICA patent. Superior cerebellar and posterior cerebral artery is patent bilaterally without significant stenosis. Venous sinuses: Patent Anatomic variants: None Delayed phase: Not performed Review of the MIP images confirms the above findings IMPRESSION: Severe diffuse atherosclerotic disease. Probable right carotid endarterectomy without stenosis. 60% diameter stenosis proximal left internal carotid artery due to heavily calcified plaque Heavily calcified cavernous carotid bilaterally with moderate stenosis bilaterally. No significant intracranial stenosis. No large vessel  occlusion 4 x 5 cm soft tissue mass at the base the neck on the right, similar to  2011. Electronically Signed   By: Franchot Gallo M.D.   On: 10/15/2016 11:33   Ct Head Code Stroke Wo Contrast  Result Date: 10/15/2016 CLINICAL DATA:  Code stroke. 83 year old male with onset of dizziness and weakness since 2230 hours. Facial droop. EXAM: CT HEAD WITHOUT CONTRAST TECHNIQUE: Contiguous axial images were obtained from the base of the skull through the vertex without intravenous contrast. COMPARISON:  Head CT without contrast 02/17/2010 and earlier. FINDINGS: Brain: Mild cerebral volume loss since 2011. Areas of chronic appearing encephalomalacia in the right parietal lobe and both cerebellar hemispheres with progression since 2011. Patchy and confluent bilateral cerebral white matter hypodensity also appears somewhat progressed since 2011 with bilateral deep gray matter involvement. New confluent hypodensity in the left paracentral pons (series 3, image 8) which could be chronic but is age indeterminate. No acute intracranial hemorrhage identified. No midline shift, mass effect, or evidence of intracranial mass lesion. No definite acute cortically based infarct identified. Vascular: Extensive Calcified atherosclerosis at the skull base. No suspicious intracranial vascular hyperdensity. Skull: No acute osseous abnormality identified. Sinuses/Orbits: Chronic bilateral paranasal sinus disease with mucoperiosteal thickening. Progression in the left sphenoid sinus since 2011. Chronic postoperative changes to the frontal sinuses. Tympanic cavities and mastoids remain clear. Other: No acute orbit or scalp soft tissue finding. Postoperative changes to both globes. ASPECTS [facility] Stroke Program Early CT Score) - Ganglionic level infarction (caudate, lentiform nuclei, internal capsule, insula, M1-M3 cortex): 7 - Supraganglionic infarction (M4-M6 cortex): 3 Total score (0-10 with 10 being normal): 10 IMPRESSION: 1. Chronic  ischemic disease with age indeterminate lacune in the left paracentral pons, and bilateral white matter progression since 2011. 2. Progressed but chronic appearing cortical infarcts in the right parietal lobe and both cerebellar hemispheres. ASPECTS is 10. 3. No acute intracranial hemorrhage or mass effect. 4. Progressed chronic paranasal sinus disease. 5. Study discussed by telephone with Dr. Larae Grooms on 10/15/2016 at 09:38 . Electronically Signed   By: Genevie Ann M.D.   On: 10/15/2016 09:40    EKG:    IMPRESSION AND PLAN:   Gurjit Loconte  is a 83 y.o. male with a known history of Stroke, hypertension, glaucoma comes to the emergency room from home with complaints of weakness in both lower extremity upper extremity could not get out of bed to walk along with left facial weakness/droop.  1. Left facial droop and bilateral lower extremity weakness likely due to possible stroke -Admit to medical floor -MRI of the brain -Patient was on aspirin----added Plavix -Continue statins -CT angiogram of the head and neck reviewed -Neurology consultation  2. Hypertension -Continue home meds  3. Glaucoma continue eyedrops  4. DVT prophylaxis subcutaneous Lovenox  Above was discussed with patient and patient's daughter in the ER  All the records are reviewed and case discussed with ED provider. Management plans discussed with the patient, family and they are in agreement.  CODE STATUS: Full  TOTAL TIME TAKING CARE OF THIS PATIENT: 50* minutes.    Candance Bohlman M.D on 10/15/2016 at 2:59 PM  Between 7am to 6pm - Pager - [phone]  After 6pm go to [email or web address] - password [email or web address] [facility]  [facility]  Office  [phone]  CC: Primary care physician; [name], MD

## 2016-10-15 NOTE — ED Provider Notes (Signed)
[facility] Emergency Department Provider Note  ____________________________________________   First MD Initiated Contact with Patient 10/15/16 [phone]     (approximate)  I have reviewed the triage vital signs and the nursing notes.   HISTORY  Chief Complaint Weakness   HPI Steve Bryant is a 83 y.o. male with a history of hypertension and stroke as well as a 4+ centimeter AAA who is presenting to the emergency department today with bilateral lower extremity weakness that started yesterday and has now extended diffusely and includes a left-sided facial droop. The patient has a history of TIA. Is denying any pain.   Past Medical History:  Diagnosis Date  . Arthritis   . Glaucoma   . Hypertension   . Stroke [facility])     Patient Active Problem List   Diagnosis Date Noted  . Essential hypertension, benign 09/18/2016  . Stroke ([place]) 09/18/2016  . AAA (abdominal aortic aneurysm) without rupture ([place]) 09/18/2016    Past Surgical History:  Procedure Laterality Date  . EYE SURGERY    . neck fusion    . TUMOR REMOVAL      Prior to Admission medications   Medication Sig Start Date End Date Taking? Authorizing Provider  amLODipine (NORVASC) 2.5 MG tablet Take 2.5 mg by mouth daily. 07/07/16  Yes [provider]  aspirin EC 81 MG tablet Take by mouth.   Yes [provider]  dorzolamide-timolol (COSOPT) 22.3-6.8 MG/ML ophthalmic solution INSTILL ONE DROP TWICE DAILY TO BOTH EYES 08/10/16  Yes [provider]  latanoprost (XALATAN) 0.005 % ophthalmic solution USE 1 DROP IN THE RIGHT EYE AT BEDTIME AS DIRECTED 09/03/16  Yes [provider]  losartan (COZAAR) 50 MG tablet TAKE 1 TABLET (50 MG TOTAL) BY MOUTH ONCE DAILY. 08/09/16  Yes [provider]  LYRICA 75 MG capsule Take 75 mg by mouth 3 (three) times daily. 07/10/16  Yes [provider]  Multiple Vitamin (MULTIVITAMIN) tablet Take 1 tablet by mouth daily.    Yes [provider]  omeprazole (PRILOSEC) 20 MG capsule Take 20 mg by mouth daily.   Yes [provider]    Allergies Shellfish-derived products  Family History  Problem Relation Age of Onset  . Stroke Mother   . Stroke Father   . Cancer Brother     Social History Social History  Substance Use Topics  . Smoking status: Former Research scientist (life sciences)  . Smokeless tobacco: Never Used  . Alcohol use Yes     Comment: occasionally    Review of Systems  Constitutional: No fever/chills Eyes: No visual changes.Patient has severely impaired vision at his baseline. ENT: No sore throat. Cardiovascular: Denies chest pain. Respiratory: Denies shortness of breath. Gastrointestinal: No abdominal pain.  No nausea, no vomiting.  No diarrhea.  No constipation. Genitourinary: Negative for dysuria. Musculoskeletal: Negative for back pain. Skin: Negative for rash. Neurological: Negative for headaches, focal  numbness.   ____________________________________________   PHYSICAL EXAM:  VITAL SIGNS: ED Triage Vitals  Enc Vitals Group     BP 10/15/16 0856 (!) 145/64     Pulse Rate 10/15/16 0856 (!) 56     Resp 10/15/16 0856 18     Temp 10/15/16 0856 97.6 F (36.4 C)     Temp Source 10/15/16 0856 Oral     SpO2 10/15/16 0856 100 %     Weight 10/15/16 0857 150 lb (68 kg)     Height 10/15/16 0857 5\' 10"  (1.778 m)  Head Circumference --      Peak Flow --      Pain Score --      Pain Loc --      Pain Edu? --      Excl. in [place]? --     Constitutional: Alert and oriented. Well appearing and in no acute distress. Eyes: Conjunctivae are normal.  Head: Atraumatic. Nose: No congestion/rhinnorhea. Mouth/Throat: Mucous membranes are moist.  Neck: No stridor.   Cardiovascular: Normal rate, regular rhythm. Grossly normal heart sounds.   Respiratory: Normal respiratory effort.  No retractions. Lungs CTAB. Gastrointestinal: Soft and nontender. No distention.  Musculoskeletal: No lower  extremity tenderness nor edema.  No joint effusions. Neurologic:  Mild slurred speech. Skin:  Skin is warm, dry and intact. No rash noted. Psychiatric: Mood and affect are normal. Speech and behavior are normal.  NIH Stroke Scale   Person Administering Scale: Doran Stabler  Administer stroke scale items in the order listed. Record performance in each category after each subscale exam. Do not go back and change scores. Follow directions provided for each exam technique. Scores should reflect what the patient does, not what the clinician thinks the patient can do. The clinician should record answers while administering the exam and work quickly. Except where indicated, the patient should not be coached (i.e., repeated requests to patient to make a special effort).   1a  Level of consciousness: 0=alert; keenly responsive  1b. LOC questions:  0=Performs both tasks correctly  1c. LOC commands: 0=Performs both tasks correctly  2.  Best Gaze: 0=normal  3.  Visual: 0=No visual loss  4. Facial Palsy: 2=Partial paralysis (total or near total paralysis of the lower face)  5a.  Motor left arm: 0=No drift, limb holds 90 (or 45) degrees for full 10 seconds  5b.  Motor right arm: 0=No drift, limb holds 90 (or 45) degrees for full 10 seconds  6a. motor left leg: 0=No drift, limb holds 90 (or 45) degrees for full 10 seconds  6b  Motor right leg:  0=No drift, limb holds 90 (or 45) degrees for full 10 seconds  7. Limb Ataxia: 0=Absent  8.  Sensory: 0=Normal; no sensory loss  9. Best Language:  0=No aphasia, normal  10. Dysarthria: 1  11. Extinction and Inattention: 0=No abnormality  12. Distal motor function: 0=Normal   Total:   3   ____________________________________________   LABS (all labs ordered are listed, but only abnormal results are displayed)  Labs Reviewed  CBC - Abnormal; Notable for the following:       Result Value   RDW 15.5 (*)    All other components within normal limits    DIFFERENTIAL - Abnormal; Notable for the following:    Monocytes Absolute 1.1 (*)    All other components within normal limits  COMPREHENSIVE METABOLIC PANEL - Abnormal; Notable for the following:    Glucose, Bld 106 (*)    ALT 13 (*)    GFR calc non Af Amer 54 (*)    All other components within normal limits  PROTIME-INR  APTT  TROPONIN I  GLUCOSE, CAPILLARY  CBG MONITORING, ED   ____________________________________________  EKG  ED ECG REPORT I, Doran Stabler, the attending physician, personally viewed and interpreted this ECG.   Date: 10/15/2016  EKG Time: 0904  Rate: 57  Rhythm: normal sinus rhythm  Axis: Normal  Intervals:Prolonged PR interval  ST&T Change: No ST segment elevation or depression. No abnormal T-wave inversion.  ____________________________________________  RADIOLOGY  Chronic ischemic disease with age-indeterminate lacunar and left paracentral pons.  CT angiography with up to 60% stenosis of the proximal left carotid artery. ____________________________________________   PROCEDURES  Procedure(s) performed:   Procedures  Critical Care performed:   ____________________________________________   INITIAL IMPRESSION / ASSESSMENT AND PLAN / ED COURSE  Pertinent labs & imaging results that were available during my care of the patient were reviewed by me and considered in my medical decision making (see chart for details).  Patient made a stroke alert because of worsening symptoms including left-sided facial droop that per the family just started in the emergency department today. Patient being evaluated by the specialist on-call neurologist.    ----------------------------------------- 12:28 PM on 10/15/2016 -----------------------------------------  Patient with persistent facial droop. I discussed the angiography results with the neurologist, Dr. Charlesetta Ivory, who does not recommend intervention at this time. We will proceed with Plavix  and admitted to the hospital. Signed out to Dr. Leslye Peer.  Patient as well as family are aware of the diagnosis and the need for permission to the hospital.  ____________________________________________   FINAL CLINICAL IMPRESSION(S) / ED DIAGNOSES  CVA.    NEW MEDICATIONS STARTED DURING THIS VISIT:  New Prescriptions   No medications on file     Note:  This document was prepared using Dragon voice recognition software and may include unintentional dictation errors.     Orbie Pyo, MD 10/15/16 [phone]

## 2016-10-15 NOTE — Progress Notes (Signed)
[place] responded to a Code Stroke page for pt in [place]. Pt met with pt and granddaughter who was at bedside. [place] talked to the granddaughter while the nurse were evaluating pt. Pt was alert and talking at this time. [place] asked pt if he needed [facility] services, pt declined. [place] told pt's granddaughter to ask nurse to page [facility] if a need for pastoral care arise. [place] provided spiritual support and presence.    10/15/16 1000  Clinical Encounter Type  Visited With Patient and family together  Visit Type Initial;Code;Trauma  Referral From Nurse  Consult/Referral To Chaplain  Spiritual Encounters  Spiritual Needs Other (Comment)

## 2016-10-15 NOTE — Progress Notes (Signed)
CH made a follow up visit with pt. CH met with pt and pt's family at bedside. Pt wanted to know when he will be allowed to eat. Pt was interactive, relaxed and inquisitive about his health status. Pt said he wants the dc to explain to him why he had a TI this time. [place] listened to pt, offered spiritual support and a ministry of present to both pt and family.   10/15/16 1200  Clinical Encounter Type  Visited With Patient and family together  Visit Type Follow-up  Referral From Chaplain  Spiritual Encounters  Spiritual Needs Other (Comment)

## 2016-10-15 NOTE — ED Notes (Signed)
Assisted the patient with voiding in a urinal at the bedside.

## 2016-10-15 NOTE — ED Notes (Signed)
Pt informed cannot eat or drink anything else

## 2016-10-16 ENCOUNTER — Encounter: Payer: Self-pay | Admitting: Emergency Medicine

## 2016-10-16 ENCOUNTER — Emergency Department: Payer: Medicare Other

## 2016-10-16 ENCOUNTER — Other Ambulatory Visit: Payer: Self-pay

## 2016-10-16 ENCOUNTER — Inpatient Hospital Stay
Admission: EM | Admit: 2016-10-16 | Discharge: 2016-10-19 | DRG: 065 | Disposition: A | Discharge status: Skilled Nursing Facility | Payer: Medicare Other | Attending: Internal Medicine | Admitting: Internal Medicine

## 2016-10-16 DIAGNOSIS — R1310 Dysphagia, unspecified: Secondary | ICD-10-CM | POA: Diagnosis present

## 2016-10-16 DIAGNOSIS — I639 Cerebral infarction, unspecified: Secondary | ICD-10-CM

## 2016-10-16 DIAGNOSIS — I714 Abdominal aortic aneurysm, without rupture: Secondary | ICD-10-CM | POA: Diagnosis present

## 2016-10-16 DIAGNOSIS — G8191 Hemiplegia, unspecified affecting right dominant side: Secondary | ICD-10-CM | POA: Diagnosis present

## 2016-10-16 DIAGNOSIS — I6329 Cerebral infarction due to unspecified occlusion or stenosis of other precerebral arteries: Secondary | ICD-10-CM | POA: Diagnosis present

## 2016-10-16 DIAGNOSIS — R471 Dysarthria and anarthria: Secondary | ICD-10-CM | POA: Diagnosis present

## 2016-10-16 DIAGNOSIS — K219 Gastro-esophageal reflux disease without esophagitis: Secondary | ICD-10-CM | POA: Diagnosis present

## 2016-10-16 DIAGNOSIS — M6281 Muscle weakness (generalized): Secondary | ICD-10-CM

## 2016-10-16 DIAGNOSIS — R531 Weakness: Secondary | ICD-10-CM | POA: Diagnosis present

## 2016-10-16 DIAGNOSIS — G8194 Hemiplegia, unspecified affecting left nondominant side: Secondary | ICD-10-CM | POA: Diagnosis present

## 2016-10-16 DIAGNOSIS — Z7982 Long term (current) use of aspirin: Secondary | ICD-10-CM | POA: Diagnosis not present

## 2016-10-16 DIAGNOSIS — H547 Unspecified visual loss: Secondary | ICD-10-CM | POA: Diagnosis present

## 2016-10-16 DIAGNOSIS — Z91013 Allergy to seafood: Secondary | ICD-10-CM | POA: Diagnosis not present

## 2016-10-16 DIAGNOSIS — I6381 Other cerebral infarction due to occlusion or stenosis of small artery: Secondary | ICD-10-CM

## 2016-10-16 DIAGNOSIS — Z981 Arthrodesis status: Secondary | ICD-10-CM | POA: Diagnosis not present

## 2016-10-16 DIAGNOSIS — R29898 Other symptoms and signs involving the musculoskeletal system: Secondary | ICD-10-CM | POA: Diagnosis not present

## 2016-10-16 DIAGNOSIS — Z8673 Personal history of transient ischemic attack (TIA), and cerebral infarction without residual deficits: Secondary | ICD-10-CM | POA: Diagnosis not present

## 2016-10-16 DIAGNOSIS — Z79899 Other long term (current) drug therapy: Secondary | ICD-10-CM | POA: Diagnosis not present

## 2016-10-16 DIAGNOSIS — H409 Unspecified glaucoma: Secondary | ICD-10-CM | POA: Diagnosis present

## 2016-10-16 DIAGNOSIS — R29810 Facial weakness: Secondary | ICD-10-CM | POA: Diagnosis present

## 2016-10-16 DIAGNOSIS — I10 Essential (primary) hypertension: Secondary | ICD-10-CM | POA: Diagnosis present

## 2016-10-16 DIAGNOSIS — E785 Hyperlipidemia, unspecified: Secondary | ICD-10-CM | POA: Diagnosis present

## 2016-10-16 DIAGNOSIS — I739 Peripheral vascular disease, unspecified: Secondary | ICD-10-CM | POA: Diagnosis present

## 2016-10-16 DIAGNOSIS — M1990 Unspecified osteoarthritis, unspecified site: Secondary | ICD-10-CM | POA: Diagnosis present

## 2016-10-16 DIAGNOSIS — R2681 Unsteadiness on feet: Secondary | ICD-10-CM | POA: Diagnosis present

## 2016-10-16 DIAGNOSIS — Z823 Family history of stroke: Secondary | ICD-10-CM | POA: Diagnosis not present

## 2016-10-16 DIAGNOSIS — Z87891 Personal history of nicotine dependence: Secondary | ICD-10-CM | POA: Diagnosis not present

## 2016-10-16 HISTORY — DX: Cerebral infarction, unspecified: I63.9

## 2016-10-16 LAB — DIFFERENTIAL
Basophils Absolute: 0.1 10*3/uL (ref 0–0.1)
Basophils Relative: 1 %
Eosinophils Absolute: 0.1 10*3/uL (ref 0–0.7)
Eosinophils Relative: 2 %
Lymphocytes Relative: 18 %
Lymphs Abs: 1.7 10*3/uL (ref 1.0–3.6)
Monocytes Absolute: 0.8 10*3/uL (ref 0.2–1.0)
Monocytes Relative: 9 %
Neutro Abs: 6.5 10*3/uL (ref 1.4–6.5)
Neutrophils Relative %: 70 %

## 2016-10-16 LAB — COMPREHENSIVE METABOLIC PANEL
ALT: 13 U/L — ABNORMAL LOW (ref 17–63)
AST: 23 U/L (ref 15–41)
Albumin: 3.8 g/dL (ref 3.5–5.0)
Alkaline Phosphatase: 56 U/L (ref 38–126)
Anion gap: 7 (ref 5–15)
BUN: 17 mg/dL (ref 6–20)
CO2: 26 mmol/L (ref 22–32)
Calcium: 9.5 mg/dL (ref 8.9–10.3)
Chloride: 108 mmol/L (ref 101–111)
Creatinine, Ser: 1.39 mg/dL — ABNORMAL HIGH (ref 0.61–1.24)
GFR calc Af Amer: 51 mL/min — ABNORMAL LOW (ref >60)
GFR calc non Af Amer: 44 mL/min — ABNORMAL LOW (ref >60)
Glucose, Bld: 122 mg/dL — ABNORMAL HIGH (ref 65–99)
Potassium: 4.3 mmol/L (ref 3.5–5.1)
Sodium: 141 mmol/L (ref 135–145)
Total Bilirubin: 1.1 mg/dL (ref 0.3–1.2)
Total Protein: 6.8 g/dL (ref 6.5–8.1)

## 2016-10-16 LAB — CBC
HCT: 46.6 % (ref 40.0–52.0)
Hemoglobin: 15.4 g/dL (ref 13.0–18.0)
MCH: 28.7 pg (ref 26.0–34.0)
MCHC: 33.1 g/dL (ref 32.0–36.0)
MCV: 86.8 fL (ref 80.0–100.0)
Platelets: 234 10*3/uL (ref 150–440)
RBC: 5.37 MIL/uL (ref 4.40–5.90)
RDW: 15.4 % — ABNORMAL HIGH (ref 11.5–14.5)
WBC: 9.2 10*3/uL (ref 3.8–10.6)

## 2016-10-16 LAB — APTT: aPTT: 31 seconds (ref 24–36)

## 2016-10-16 LAB — LIPID PANEL
Cholesterol: 184 mg/dL (ref 0–200)
HDL: 46 mg/dL (ref >40)
LDL Cholesterol: 98 mg/dL (ref 0–99)
Total CHOL/HDL Ratio: 4 RATIO
Triglycerides: 201 mg/dL — ABNORMAL HIGH (ref <150)
VLDL: 40 mg/dL (ref 0–40)

## 2016-10-16 LAB — GLUCOSE, CAPILLARY: Glucose-Capillary: 117 mg/dL — ABNORMAL HIGH (ref 65–99)

## 2016-10-16 LAB — PROTIME-INR
INR: 0.91
Prothrombin Time: 12.2 seconds (ref 11.4–15.2)

## 2016-10-16 LAB — TROPONIN I: Troponin I: <0.03 ng/mL (ref <0.03)

## 2016-10-16 MED ORDER — ACETAMINOPHEN 160 MG/5ML PO SOLN
650.0000 mg | ORAL | Status: DC | PRN
  Filled 2016-10-16: qty 20.3

## 2016-10-16 MED ORDER — CLOPIDOGREL BISULFATE 75 MG PO TABS
75.0000 mg | ORAL_TABLET | Freq: Every day | ORAL | Status: DC
  Administered 2016-10-16 – 2016-10-19 (×4): 75 mg via ORAL
  Filled 2016-10-16 (×4): qty 1

## 2016-10-16 MED ORDER — SENNOSIDES-DOCUSATE SODIUM 8.6-50 MG PO TABS: 1.0000 | ORAL_TABLET | Freq: Every evening | ORAL | Status: DC | PRN

## 2016-10-16 MED ORDER — ROSUVASTATIN CALCIUM 10 MG PO TABS
5.0000 mg | ORAL_TABLET | Freq: Every day | ORAL | Status: DC
  Administered 2016-10-16 – 2016-10-18 (×3): 5 mg via ORAL
  Filled 2016-10-16 (×3): qty 1

## 2016-10-16 MED ORDER — SODIUM CHLORIDE 0.9 % IV BOLUS (SEPSIS)
500.0000 mL | Freq: Once | INTRAVENOUS | Status: AC
  Administered 2016-10-16: 500 mL via INTRAVENOUS

## 2016-10-16 MED ORDER — SODIUM CHLORIDE 0.9 % IV SOLN
Freq: Once | INTRAVENOUS | Status: AC
  Administered 2016-10-16: 21:00:00 via INTRAVENOUS

## 2016-10-16 MED ORDER — CLOPIDOGREL BISULFATE 75 MG PO TABS
300.0000 mg | ORAL_TABLET | ORAL | Status: DC
  Administered 2016-10-16: 300 mg via ORAL
  Filled 2016-10-16: qty 4

## 2016-10-16 MED ORDER — ASPIRIN EC 81 MG PO TBEC
81.0000 mg | DELAYED_RELEASE_TABLET | Freq: Every day | ORAL | Status: DC
  Administered 2016-10-17 – 2016-10-19 (×3): 81 mg via ORAL
  Filled 2016-10-16 (×3): qty 1

## 2016-10-16 MED ORDER — LATANOPROST 0.005 % OP SOLN
1.0000 [drp] | Freq: Every day | OPHTHALMIC | Status: DC
  Administered 2016-10-16 – 2016-10-18 (×3): 1 [drp] via OPHTHALMIC
  Filled 2016-10-16: qty 2.5

## 2016-10-16 MED ORDER — LOSARTAN POTASSIUM 25 MG PO TABS: 25.0000 mg | ORAL_TABLET | Freq: Every day | ORAL | 2 refills | Status: DC

## 2016-10-16 MED ORDER — STROKE: EARLY STAGES OF RECOVERY BOOK
Freq: Once | Status: AC
  Administered 2016-10-17: 08:00:00

## 2016-10-16 MED ORDER — PREGABALIN 75 MG PO CAPS
75.0000 mg | ORAL_CAPSULE | Freq: Three times a day (TID) | ORAL | Status: DC
  Administered 2016-10-16 – 2016-10-19 (×8): 75 mg via ORAL
  Filled 2016-10-16 (×8): qty 1

## 2016-10-16 MED ORDER — PANTOPRAZOLE SODIUM 40 MG PO TBEC
40.0000 mg | DELAYED_RELEASE_TABLET | Freq: Every day | ORAL | Status: DC
  Administered 2016-10-16 – 2016-10-19 (×4): 40 mg via ORAL
  Filled 2016-10-16 (×4): qty 1

## 2016-10-16 MED ORDER — ASPIRIN 81 MG PO CHEW
324.0000 mg | CHEWABLE_TABLET | Freq: Once | ORAL | Status: AC
  Administered 2016-10-16: 324 mg via ORAL
  Filled 2016-10-16: qty 4

## 2016-10-16 MED ORDER — DORZOLAMIDE HCL-TIMOLOL MAL 2-0.5 % OP SOLN
1.0000 [drp] | Freq: Two times a day (BID) | OPHTHALMIC | Status: DC
  Administered 2016-10-17 – 2016-10-19 (×5): 1 [drp] via OPHTHALMIC
  Filled 2016-10-16: qty 10

## 2016-10-16 MED ORDER — MELATONIN 5 MG PO TABS
10.0000 mg | ORAL_TABLET | Freq: Every day | ORAL | Status: DC
  Administered 2016-10-17 – 2016-10-18 (×2): 10 mg via ORAL
  Filled 2016-10-16 (×6): qty 2

## 2016-10-16 MED ORDER — ENOXAPARIN SODIUM 40 MG/0.4ML ~~LOC~~ SOLN
40.0000 mg | SUBCUTANEOUS | Status: DC
  Administered 2016-10-16 – 2016-10-18 (×3): 40 mg via SUBCUTANEOUS
  Filled 2016-10-16 (×3): qty 0.4

## 2016-10-16 MED ORDER — ADULT MULTIVITAMIN W/MINERALS CH
1.0000 | ORAL_TABLET | Freq: Every day | ORAL | Status: DC
  Administered 2016-10-17 – 2016-10-19 (×3): 1 via ORAL
  Filled 2016-10-16 (×3): qty 1

## 2016-10-16 MED ORDER — AMLODIPINE BESYLATE 5 MG PO TABS
2.5000 mg | ORAL_TABLET | Freq: Every day | ORAL | Status: DC
  Administered 2016-10-16 – 2016-10-19 (×4): 2.5 mg via ORAL
  Filled 2016-10-16 (×4): qty 1

## 2016-10-16 MED ORDER — CLOPIDOGREL BISULFATE 75 MG PO TABS: 75.0000 mg | ORAL_TABLET | Freq: Every day | ORAL | 11 refills | Status: AC

## 2016-10-16 MED ORDER — ACETAMINOPHEN 325 MG PO TABS: 650.0000 mg | ORAL_TABLET | ORAL | Status: DC | PRN

## 2016-10-16 MED ORDER — ROSUVASTATIN CALCIUM 5 MG PO TABS: 5.0000 mg | ORAL_TABLET | Freq: Every day | ORAL | 2 refills | Status: DC

## 2016-10-16 MED ORDER — ACETAMINOPHEN 650 MG RE SUPP: 650.0000 mg | RECTAL | Status: DC | PRN

## 2016-10-16 MED ORDER — LOSARTAN POTASSIUM 25 MG PO TABS
25.0000 mg | ORAL_TABLET | Freq: Every day | ORAL | Status: DC
  Administered 2016-10-16 – 2016-10-19 (×4): 25 mg via ORAL
  Filled 2016-10-16 (×4): qty 1

## 2016-10-16 NOTE — ED Provider Notes (Addendum)
[facility] Emergency Department Provider Note   ____________________________________________   None    (approximate)  I have reviewed the triage vital signs and the nursing notes.   HISTORY  Chief Complaint Code Stroke    HPI Steve Bryant is a 83 y.o. male comes back reporting significant increase in weakness in the left side, slurring of his speech. No symptoms seem worse after getting up about 10 AM this morning in the hospital, then noted the hospital staff time of discharge he was feeling a little better. Hes discharged to home, and re-exhibited weakness on the left arm left leg. Reports he is having the same symptoms upon him back to the hospital initially.  No headache. No chest pain or trouble breathing. He was told he needed to start taking Plavix but has not filled prescription yet  Reports his symptoms seem to worsen on his way home, his left leg was very weak and is unable to stand when trying to get out of the car. The left arm slightly weak as well. No vision changes, but he reports is almost blind at baseline   Past Medical History:  Diagnosis Date  . AAA (abdominal aortic aneurysm) ([place])   . Arthritis   . Glaucoma   . Hypertension   . Stroke [facility])     Patient Active Problem List   Diagnosis Date Noted  . CVA (cerebral vascular accident) ([place]) 10/15/2016  . Essential hypertension, benign 09/18/2016  . Stroke ([place]) 09/18/2016  . AAA (abdominal aortic aneurysm) without rupture ([place]) 09/18/2016    Past Surgical History:  Procedure Laterality Date  . EYE SURGERY    . neck fusion    . TUMOR REMOVAL      Prior to Admission medications   Medication Sig Start Date End Date Taking? Authorizing Provider  amLODipine (NORVASC) 2.5 MG tablet Take 2.5 mg by mouth daily. 07/07/16  Yes [provider]  aspirin EC 81 MG tablet Take by mouth.   Yes [provider]  dorzolamide-timolol (COSOPT) 22.3-6.8 MG/ML ophthalmic  solution INSTILL ONE DROP TWICE DAILY TO BOTH EYES 08/10/16  Yes [provider]  latanoprost (XALATAN) 0.005 % ophthalmic solution USE 1 DROP IN THE RIGHT EYE AT BEDTIME AS DIRECTED 09/03/16  Yes [provider]  losartan (COZAAR) 25 MG tablet Take 1 tablet (25 mg total) by mouth daily. 10/16/16  Yes [place]stone Lighter, MD  LYRICA 75 MG capsule Take 75 mg by mouth 3 (three) times daily. 07/10/16  Yes [provider]  Melatonin 10 MG TABS Take 1 tablet by mouth at bedtime.   Yes [provider]  Multiple Vitamin (MULTIVITAMIN) tablet Take 1 tablet by mouth daily.   Yes [provider]  omeprazole (PRILOSEC) 20 MG capsule Take 20 mg by mouth daily.   Yes [provider]  clopidogrel (PLAVIX) 75 MG tablet Take 1 tablet (75 mg total) by mouth daily. 10/16/16 10/16/17  Gladstone Lighter, MD  rosuvastatin (CRESTOR) 5 MG tablet Take 1 tablet (5 mg total) by mouth at bedtime. Patient not taking: Reported on 10/16/2016 10/16/16 10/16/17  Gladstone Lighter, MD    Allergies Shellfish-derived products  Family History  Problem Relation Age of Onset  . Stroke Mother   . Stroke Father   . Cancer Brother     Social History Social History  Substance Use Topics  . Smoking status: Former Research scientist (life sciences)  . Smokeless tobacco: Never Used  . Alcohol use Yes     Comment: occasionally  Review of Systems Constitutional: No fever/chills Eyes: No visual changes.blindness chronically ENT: No sore throat. Cardiovascular: Denies chest pain. Respiratory: Denies shortness of breath. Gastrointestinal: No abdominal pain.  No nausea, no vomiting.  No diarrhea.  No constipation. Genitourinary: Negative for dysuria. Musculoskeletal: Negative for back pain. Skin: Negative for rash. Neurological: Negative for headaches, no numbness.    ____________________________________________   PHYSICAL EXAM:  VITAL SIGNS: ED Triage Vitals [10/16/16 1341]  Enc Vitals Group      BP 122/67     Pulse Rate (!) 58     Resp 18     Temp 98.1 F (36.7 C)     Temp Source Oral     SpO2 95 %     Weight      Height      Head Circumference      Peak Flow      Pain Score      Pain Loc      Pain Edu?      Excl. in [place]?     Constitutional: Alert and oriented. Well appearing and in no acute distress. Eyes: Conjunctivae are normal. Head: Atraumatic. Nose: No congestion/rhinnorhea. Mouth/Throat: Mucous membranes are moist. Neck: No stridor.   Cardiovascular: Normal rate, regular rhythm. Grossly normal heart sounds.  Good peripheral circulation. Respiratory: Normal respiratory effort.  No retractions. Lungs CTAB. Gastrointestinal: Soft and nontender. No distention. Musculoskeletal: No lower extremity tenderness nor edema.there is mild weakness about 4-5 strength in the left arm and left leg. There is drift noted of the left leg. No drift to left arm. He has a mild left-sided facial droop. Extremity movements appear to be intact, but he does report baseline chronic blindness Neurologic:  Normal speech and language.  Skin:  Skin is warm, dry and intact. No rash noted. Psychiatric: Mood and affect are normal. Speech slightly slurred  ____________________________________________   LABS (all labs ordered are listed, but only abnormal results are displayed)  Labs Reviewed  CBC - Abnormal; Notable for the following:       Result Value   RDW 15.4 (*)    All other components within normal limits  COMPREHENSIVE METABOLIC PANEL - Abnormal; Notable for the following:    Glucose, Bld 122 (*)    Creatinine, Ser 1.39 (*)    ALT 13 (*)    GFR calc non Af Amer 44 (*)    GFR calc Af Amer 51 (*)    All other components within normal limits  GLUCOSE, CAPILLARY - Abnormal; Notable for the following:    Glucose-Capillary 117 (*)    All other components within normal limits  PROTIME-INR  APTT  DIFFERENTIAL  TROPONIN I  CBG MONITORING, ED    ____________________________________________  EKG  Reviewed and interpreted by me at 1405 Heart rate 60 QRS 110 QTc 410 Normal sinus rhythm, mild prolongation of PR, no evidence of ischemia ____________________________________________  RADIOLOGY  Mr Jeri Cos Wo Contrast  Result Date: 10/15/2016 CLINICAL DATA:  83 year old male with new onset bilateral lower extremity weakness, left facial droop and weakness. EXAM: MRI HEAD WITHOUT AND WITH CONTRAST TECHNIQUE: Multiplanar, multiecho pulse sequences of the brain and surrounding structures were obtained without and with intravenous contrast. CONTRAST:  21mL MULTIHANCE GADOBENATE DIMEGLUMINE 529 MG/ML IV SOLN COMPARISON:  CTA head and neck 1056 hours today. Brain MRI 07/03/2009. FINDINGS: Brain: No restricted diffusion or evidence of acute infarction. Patchy chronic encephalomalacia in the right parietal lobe re- identified along with numerous chronic patchy in lacunar infarcts in  both cerebellar hemispheres, the pons (progressed on the left since 2011), bilateral thalami and basal ganglia. Underlying extensive T2 heterogeneity in the deep gray matter nuclei compatible with a degree of dilated perivascular spaces. Associated widespread patchy cerebral white matter T2 and FLAIR hyperintensity. Small chronic microhemorrhage in the ventral pons. No abnormal enhancement or dural thickening. No restricted diffusion to suggest acute infarction. No midline shift, mass effect, evidence of mass lesion, ventriculomegaly, extra-axial collection or acute intracranial hemorrhage. Cervicomedullary junction and pituitary are within normal limits. Vascular: Major intracranial vascular flow voids are stable compared to 2011. Skull and upper cervical spine: Previous cervical ACDF. Normal bone marrow signal. Sinuses/Orbits: Stable and negative orbits soft tissues. Progressed chronic paranasal sinus disease since 2011, with inspissated material now throughout the left  sphenoid sinus. Other: Mastoids are clear. Visible internal auditory structures appear normal. Negative scalp and orbits soft tissues. IMPRESSION: Severe chronic ischemic disease but no acute infarct or acute intracranial abnormality identified. Electronically Signed   By: Genevie Ann M.D.   On: 10/15/2016 16:15   Ct Head Code Stroke Wo Contrast  Result Date: 10/16/2016 CLINICAL DATA:  Code stroke. Generalized bilateral weakness. Slurred speech. EXAM: CT HEAD WITHOUT CONTRAST TECHNIQUE: Contiguous axial images were obtained from the base of the skull through the vertex without intravenous contrast. COMPARISON:  Brain MRI 10/15/2016 FINDINGS: Brain: No mass lesion or acute hemorrhage. No focal hypoattenuation of the basal ganglia or cortex to indicate infarcted tissue. There is generalized mild for age atrophy. There is periventricular hypoattenuation compatible with chronic microvascular disease. Old left pontine infarct and bilateral old cerebellar infarcts. Vascular: No hyperdense vessel. No advanced atherosclerotic calcification of the arteries at the skull base. Skull: Normal visualized skull base, calvarium and extracranial soft tissues. Sinuses/Orbits: Near complete opacification of the visualized left ethmoid, sphenoid and maxillary sinuses. Normal orbits. ASPECTS [facility] Stroke Program Early CT Score) - Ganglionic level infarction (caudate, lentiform nuclei, internal capsule, insula, M1-M3 cortex): 7 - Supraganglionic infarction (M4-M6 cortex): 3 Total score (0-10 with 10 being normal): 10 IMPRESSION: 1. No acute hemorrhage or mass lesion. 2. Severe chronic microvascular ischemia. 3. ASPECTS is 10. These results were called by telephone at the time of interpretation on 10/16/2016 at 1:57 pm to Dr. Delman Kitten , who verbally acknowledged these results. Electronically Signed   By: Ulyses Jarred M.D.   On: 10/16/2016 14:02    ____________________________________________   PROCEDURES  Procedure(s)  performed: None  Procedures  Critical Care performed: Yes, see critical care note(s)  CRITICAL CARE Performed by: Delman Kitten   Total critical care time: 38 minutes  Critical care time was exclusive of separately billable procedures and treating other patients.  Critical care was necessary to treat or prevent imminent or life-threatening deterioration.  Critical care was time spent personally by me on the following activities: development of treatment plan with patient and/or surrogate as well as nursing, discussions with consultants, evaluation of patient's response to treatment, examination of patient, obtaining history from patient or surrogate, ordering and performing treatments and interventions, ordering and review of laboratory studies, ordering and review of radiographic studies, pulse oximetry and re-evaluation of patient's condition.  ____________________________________________   INITIAL IMPRESSION / ASSESSMENT AND PLAN / ED COURSE  Pertinent labs & imaging results that were available during my care of the patient were reviewed by me and considered in my medical decision making (see chart for details).  Review of records indicates patient was just discharged, MRI did not show any acute stroke. He did  however have a mild facial droop at the time of discharge reported in the discharge note.  Clinical Course as of Oct 16 1513  Fri Oct 16, 2016  1347 Patient still in CT. Code STROKE activated by triage RN.   [MQ]  [phone] Review of records indicates that the patient was discharged slight facial droop. Discussion with the daughter it sounds like the facial droop never truly resolved. This point the last seen normal likely pushed up to about 2 days ago. Do not think the patient has an indication for acute TPA given the nonresolution of the facial droop, though he does report worsening of the left arm and left leg weakness, however these are improving.  [MQ]    Clinical Course User  Index [MQ] Delman Kitten, MD   Activated code stroke, tell neurology consult placed.The patient's CT and it did show some stenosis on his inpatient workup on the left side.  Discussed with Dr. Olevia Bowens (tele-neuro). Advises likely a lacunar type infarction. Aware of CT/CTA and recent MRI findings during admit. Recommends lay patient horizontal (flat) most of the time. Continue ASA/Plavix. NOT A TPA candidate, outside window.   ----------------------------------------- 2:48 PM on 10/16/2016 -----------------------------------------  Spoke with Dr. Irish Elders regarding symptoms, he advises against Plavix loading at this time. Saw patient hospital earlier but does report he had a slight facial droop but did not have left arm or left leg weakness when he evaluated. He recommends obtaining an MRI with sequence as ordered.  Tell neurologist discussed case with me, he recommends patient be readmitted, not a TPA candidate. So with Dr. Irish Elders, he indicates patient should have repeat MRI which is been ordered. However, given the patient's reason patient with recurrent and obvious left-sided hemi-pruritic symptoms are not dense have recommended he be readmitted the hospital as well as he clearly needs further evaluation regarding these neurological symptoms and likely another neurology consultation (Dr. Irish Elders is not available for bedside consult.)  Received slightly conflicting plans for [facility] neurologist who saw the patient and Dr. Barkley Bruns who saw the patient earlier. There is some disagreement on appropriate disposition. I have asked Dr. Velia Meyer to speak with [facility] psychiatry, Dr. Olevia Bowens to clarify  Plan of care assigned to Dr. Dineen Kid is to follow-up on an MRI, patient being readmitted ____________________________________________   FINAL CLINICAL IMPRESSION(S) / ED DIAGNOSES  Final diagnoses:  Lacunar stroke ([place])  Left-sided muscle weakness      NEW MEDICATIONS STARTED DURING THIS  VISIT:  New Prescriptions   No medications on file     Note:  This document was prepared using Dragon voice recognition software and may include unintentional dictation errors.     Delman Kitten, MD 10/16/16 1516    Delman Kitten, MD 10/16/16 [id number]

## 2016-10-16 NOTE — Discharge Summary (Addendum)
[facility] NAME: Steve Bryant    MR#:  [id number]  DATE OF BIRTH:  04/23/1925  DATE OF ADMISSION:  10/15/2016   ADMITTING PHYSICIAN: Fritzi Mandes, MD  DATE OF DISCHARGE: 10/16/2016 12:16 PM  PRIMARY CARE PHYSICIAN: [name], MD   ADMISSION DIAGNOSIS:   Cerebrovascular accident (CVA), unspecified mechanism ([name]) [I63.9]  DISCHARGE DIAGNOSIS:   Active Problems:   CVA (cerebral vascular accident) ([name])   SECONDARY DIAGNOSIS:   Past Medical History:  Diagnosis Date  . AAA (abdominal aortic aneurysm) ([place])   . Arthritis   . Glaucoma   . Hypertension   . Stroke [name])     HOSPITAL COURSE:   83 year old gentleman with past medical history significant for history of CVA with no residual neurological deficits, hypertension, glaucoma, legal blindness presents to hospital secondary to generalized weakness associated with dysarthria and left facial droop.  #1 TIA-presented with left facial droop, still has minimal droop and some dysarthria this morning. -MRI of the brain is negative for a acute infarcts but has extensive small risk ischemic disease. -CT angiogram revealing prior right carotid endarterectomy but left-sided stenosis of about 60% with significant atherosclerotic disease. -Agent will continue to be on aspirin and Plavix at this time. Low-dose statin has been added at discharge. LDL is pending at this time. -Appreciate neurology consult. -Patient worked with physical therapy, occupational therapy and speech therapy. He declined home speech therapy services as his speech is back to baseline. -He walked well with physical therapy, and they have recommended home health at this time. -he ambulates, does moderate exercises at home and lives by himself.  #2 hypertension-continue amlodipine. Due to soft blood pressures, losartan dose has been decreased in the hospital.  #3 glaucoma-continue his eyedrops  #4  GERD-PPI  Stable and very anxious to go home today. Will be discharged  DISCHARGE CONDITIONS:   Guarded  CONSULTS OBTAINED:   Treatment Team:  Leotis Pain, MD  DRUG ALLERGIES:   Allergies  Allergen Reactions  . Shellfish-Derived Products Nausea Only    Scallops   DISCHARGE MEDICATIONS:   Allergies as of 10/16/2016      Reactions   Shellfish-derived Products Nausea Only   Scallops      Medication List    TAKE these medications   amLODipine 2.5 MG tablet Commonly known as:  NORVASC Take 2.5 mg by mouth daily.   aspirin EC 81 MG tablet Take by mouth.   clopidogrel 75 MG tablet Commonly known as:  PLAVIX Take 1 tablet (75 mg total) by mouth daily.   dorzolamide-timolol 22.3-6.8 MG/ML ophthalmic solution Commonly known as:  COSOPT INSTILL ONE DROP TWICE DAILY TO BOTH EYES   latanoprost 0.005 % ophthalmic solution Commonly known as:  XALATAN USE 1 DROP IN THE RIGHT EYE AT BEDTIME AS DIRECTED   losartan 25 MG tablet Commonly known as:  COZAAR Take 1 tablet (25 mg total) by mouth daily. What changed:  medication strength  See the new instructions.   LYRICA 75 MG capsule Generic drug:  pregabalin Take 75 mg by mouth 3 (three) times daily.   Melatonin 10 MG Tabs Take 1 tablet by mouth at bedtime.   multivitamin tablet Take 1 tablet by mouth daily.   omeprazole 20 MG capsule Commonly known as:  PRILOSEC Take 20 mg by mouth daily.   rosuvastatin 5 MG tablet Commonly known as:  CRESTOR Take 1 tablet (5 mg total) by mouth at bedtime.  DISCHARGE INSTRUCTIONS:   1. PCP follow-up in 1-2 weeks 2. Neurology follow up in 2-3 weeks  DIET:   Cardiac diet  ACTIVITY:   Activity as tolerated  OXYGEN:   Home Oxygen: No.  Oxygen Delivery: room air  DISCHARGE LOCATION:   home   If you experience worsening of your admission symptoms, develop shortness of breath, life threatening emergency, suicidal or homicidal thoughts you must seek  medical attention immediately by calling 911 or calling your MD immediately  if symptoms less severe.  You Must read complete instructions/literature along with all the possible adverse reactions/side effects for all the Medicines you take and that have been prescribed to you. Take any new Medicines after you have completely understood and accpet all the possible adverse reactions/side effects.   Please note  You were cared for by a hospitalist during your hospital stay. If you have any questions about your discharge medications or the care you received while you were in the hospital after you are discharged, you can call the unit and asked to speak with the hospitalist on call if the hospitalist that took care of you is not available. Once you are discharged, your primary care physician will handle any further medical issues. Please note that NO REFILLS for any discharge medications will be authorized once you are discharged, as it is imperative that you return to your primary care physician (or establish a relationship with a primary care physician if you do not have one) for your aftercare needs so that they can reassess your need for medications and monitor your lab values.    On the day of Discharge:  VITAL SIGNS:   Blood pressure 120/74, pulse 64, temperature 98.6 F (37 C), temperature source Oral, resp. rate 16, height 5\' 10"  (1.778 m), weight 68 kg (150 lb), SpO2 98 %.  PHYSICAL EXAMINATION:    GENERAL:  83 y.o.-year-old elderly patient sitting in the chair with no acute distress.  EYES: Pupils equal, round, sluggishly reactive to light and accommodation. No scleral icterus. Extraocular muscles intact.  HEENT: Head atraumatic, normocephalic. Oropharynx and nasopharynx clear. Minimal left facial droop noted NECK:  Supple, no jugular venous distention. No thyroid enlargement, no tenderness.  LUNGS: Normal breath sounds bilaterally, no wheezing, rales,rhonchi or crepitation. No use of  accessory muscles of respiration. Decreased bibasilar breath sounds. CARDIOVASCULAR: S1, S2 normal. No rubs, or gallops. 3/6 systolic murmur present ABDOMEN: Soft, non-tender, non-distended. Bowel sounds present. No organomegaly or mass.  EXTREMITIES: No pedal edema, cyanosis, or clubbing.  NEUROLOGIC: Cranial nerves II through XII are intact except decreased visual acuity. Muscle strength 5/5 in all extremities. Sensation intact. Gait not checked.  PSYCHIATRIC: The patient is alert and oriented x 3.  SKIN: No obvious rash, lesion, or ulcer.   DATA REVIEW:   CBC  Recent Labs Lab 10/15/16 0919  WBC 9.1  HGB 14.5  HCT 44.5  PLT 231    Chemistries   Recent Labs Lab 10/15/16 0919  NA 144  K 4.1  CL 110  CO2 27  GLUCOSE 106*  BUN 20  CREATININE 1.16  CALCIUM 9.4  AST 24  ALT 13*  ALKPHOS 92  BILITOT 0.9     Microbiology Results  Results for orders placed or performed in visit on 02/27/12  Influenza A&B Antigens [facility])     Status: None   Collection Time: 02/27/12  5:07 AM  Result Value Ref Range Status   Micro Text Report   Final  COMMENT                   NEGATIVE FOR INFLUENZA A (ANTIGEN ABSENT)   COMMENT                   NEGATIVE FOR INFLUENZA B (ANTIGEN ABSENT)   ANTIBIOTIC                                                        RADIOLOGY:  Mr Jeri Cos Wo Contrast  Result Date: 10/15/2016 CLINICAL DATA:  82 year old male with new onset bilateral lower extremity weakness, left facial droop and weakness. EXAM: MRI HEAD WITHOUT AND WITH CONTRAST TECHNIQUE: Multiplanar, multiecho pulse sequences of the brain and surrounding structures were obtained without and with intravenous contrast. CONTRAST:  36mL MULTIHANCE GADOBENATE DIMEGLUMINE 529 MG/ML IV SOLN COMPARISON:  CTA head and neck 1056 hours today. Brain MRI 07/03/2009. FINDINGS: Brain: No restricted diffusion or evidence of acute infarction. Patchy chronic encephalomalacia in the right parietal lobe re-  identified along with numerous chronic patchy in lacunar infarcts in both cerebellar hemispheres, the pons (progressed on the left since 2011), bilateral thalami and basal ganglia. Underlying extensive T2 heterogeneity in the deep gray matter nuclei compatible with a degree of dilated perivascular spaces. Associated widespread patchy cerebral white matter T2 and FLAIR hyperintensity. Small chronic microhemorrhage in the ventral pons. No abnormal enhancement or dural thickening. No restricted diffusion to suggest acute infarction. No midline shift, mass effect, evidence of mass lesion, ventriculomegaly, extra-axial collection or acute intracranial hemorrhage. Cervicomedullary junction and pituitary are within normal limits. Vascular: Major intracranial vascular flow voids are stable compared to 2011. Skull and upper cervical spine: Previous cervical ACDF. Normal bone marrow signal. Sinuses/Orbits: Stable and negative orbits soft tissues. Progressed chronic paranasal sinus disease since 2011, with inspissated material now throughout the left sphenoid sinus. Other: Mastoids are clear. Visible internal auditory structures appear normal. Negative scalp and orbits soft tissues. IMPRESSION: Severe chronic ischemic disease but no acute infarct or acute intracranial abnormality identified. Electronically Signed   By: Genevie Ann M.D.   On: 10/15/2016 16:15     Management plans discussed with the patient, family and they are in agreement.  CODE STATUS:     Code Status Orders        Start     Ordered   10/15/16 1424  Full code  Continuous     10/15/16 1423    Code Status History    Date Active Date Inactive Code Status Order ID Comments User Context   This patient has a current code status but no historical code status.    Advance Directive Documentation     Most Recent Value  Type of Advance Directive  Healthcare Power of Attorney, Living will  Pre-existing out of facility DNR order (yellow form or pink  MOST form)  -  "MOST" Form in Place?  -      TOTAL TIME TAKING CARE OF THIS PATIENT: 37 minutes.    Teasia Zapf M.D on 10/16/2016 at 1:28 PM  Between 7am to 6pm - Pager - [phone]  After 6pm go to [email or web address] - Proofreader  [employer] [facility]  Office  [phone]  CC: Primary care physician; [name], MD   Note: This dictation was prepared with Dragon dictation along  with smaller phrase technology. Any transcriptional errors that result from this process are unintentional.

## 2016-10-16 NOTE — H&P (Signed)
[place] [facility] [place] NAME: Steve Bryant    MR#:  [id number]  DATE OF BIRTH:  07/31/1925  DATE OF ADMISSION:  10/16/2016  PRIMARY CARE PHYSICIAN: [name], MD   REQUESTING/REFERRING PHYSICIAN: Dr Jacqualine Code  CHIEF COMPLAINT:  Weakness left upper and lower extremity.  HISTORY OF PRESENT ILLNESS:  Steve Bryant  is a 83 y.o. male with a known history of CVA in the remote past, hypertension, arthritis, AAA was admitted 10/15/2016 discharge 10/16/2016 with TIA comes back to the emergency room after patient got discharged went out to eat and per daughter she was not able to move his left upper and lower extremity. He was very weak he could not get out of the car. He came back to the emergency room CT of the head was negative for acute stroke. Patient's symptoms had resolved by then. ER physician spoke with neurology recommended MRI of the brain. Patient currently is asymptomatic. MRI of the brain shows small right lacunar infarct in the pons. Patient is being admitted with acute CVA  PAST MEDICAL HISTORY:   Past Medical History:  Diagnosis Date  . AAA (abdominal aortic aneurysm) ([place])   . Arthritis   . Glaucoma   . Hypertension   . Stroke [facility])     PAST SURGICAL HISTOIRY:   Past Surgical History:  Procedure Laterality Date  . EYE SURGERY    . neck fusion    . TUMOR REMOVAL      SOCIAL HISTORY:   Social History  Substance Use Topics  . Smoking status: Former Research scientist (life sciences)  . Smokeless tobacco: Never Used  . Alcohol use Yes     Comment: occasionally    FAMILY HISTORY:   Family History  Problem Relation Age of Onset  . Stroke Mother   . Stroke Father   . Cancer Brother     DRUG ALLERGIES:   Allergies  Allergen Reactions  . Shellfish-Derived Products Nausea Only    Scallops    REVIEW OF SYSTEMS:  Review of Systems  Constitutional: Negative for chills, fever and weight loss.  HENT: Negative for ear  discharge, ear pain and nosebleeds.   Eyes: Negative for blurred vision, pain and discharge.  Respiratory: Negative for sputum production, shortness of breath, wheezing and stridor.   Cardiovascular: Negative for chest pain, palpitations, orthopnea and PND.  Gastrointestinal: Negative for abdominal pain, diarrhea, nausea and vomiting.  Genitourinary: Negative for frequency and urgency.  Musculoskeletal: Negative for back pain and joint pain.  Neurological: Positive for speech change, focal weakness and weakness. Negative for sensory change.  Psychiatric/Behavioral: Negative for depression and hallucinations. The patient is not nervous/anxious.      MEDICATIONS AT HOME:   Prior to Admission medications   Medication Sig Start Date End Date Taking? Authorizing Provider  amLODipine (NORVASC) 2.5 MG tablet Take 2.5 mg by mouth daily. 07/07/16  Yes [provider]  aspirin EC 81 MG tablet Take by mouth.   Yes [provider]  dorzolamide-timolol (COSOPT) 22.3-6.8 MG/ML ophthalmic solution INSTILL ONE DROP TWICE DAILY TO BOTH EYES 08/10/16  Yes [provider]  latanoprost (XALATAN) 0.005 % ophthalmic solution USE 1 DROP IN THE RIGHT EYE AT BEDTIME AS DIRECTED 09/03/16  Yes [provider]  losartan (COZAAR) 25 MG tablet Take 1 tablet (25 mg total) by mouth daily. 10/16/16  Yes Gladstone Lighter, MD  LYRICA 75 MG capsule Take 75 mg by mouth 3 (three) times daily. 07/10/16  Yes [provider]  Melatonin 10 MG TABS Take 1 tablet by mouth at bedtime.   Yes [provider]  Multiple Vitamin (MULTIVITAMIN) tablet Take 1 tablet by mouth daily.   Yes [provider]  omeprazole (PRILOSEC) 20 MG capsule Take 20 mg by mouth daily.   Yes [provider]  clopidogrel (PLAVIX) 75 MG tablet Take 1 tablet (75 mg total) by mouth daily. 10/16/16 10/16/17  Gladstone Lighter, MD  rosuvastatin (CRESTOR) 5 MG tablet Take 1 tablet (5 mg total) by mouth at  bedtime. Patient not taking: Reported on 10/16/2016 10/16/16 10/16/17  Gladstone Lighter, MD      VITAL SIGNS:  Blood pressure 121/77, pulse 65, temperature 98.1 F (36.7 C), temperature source Oral, resp. rate 16, SpO2 94 %.  PHYSICAL EXAMINATION:  GENERAL:  83 y.o.-year-old patient lying in the bed with no acute distress.  EYES: Pupils equal, round, reactive to light and accommodation. No scleral icterus. Extraocular muscles intact.  HEENT: Head atraumatic, normocephalic. Oropharynx and nasopharynx clear. Left facial drool chronic NECK:  Supple, no jugular venous distention. No thyroid enlargement, no tenderness.  LUNGS: Normal breath sounds bilaterally, no wheezing, rales,rhonchi or crepitation. No use of accessory muscles of respiration.  CARDIOVASCULAR: S1, S2 normal. No murmurs, rubs, or gallops.  ABDOMEN: Soft, nontender, nondistended. Bowel sounds present. No organomegaly or mass.  EXTREMITIES: No pedal edema, cyanosis, or clubbing.  NEUROLOGIC: Cranial nerves II through XII are intact. Muscle strength 4/5 in all extremities. Sensation intact. Gait not checked.  PSYCHIATRIC: The patient is alert and oriented x 3.  SKIN: No obvious rash, lesion, or ulcer.   LABORATORY PANEL:   CBC  Recent Labs Lab 10/16/16 1356  WBC 9.2  HGB 15.4  HCT 46.6  PLT 234   ------------------------------------------------------------------------------------------------------------------  Chemistries   Recent Labs Lab 10/16/16 1356  NA 141  K 4.3  CL 108  CO2 26  GLUCOSE 122*  BUN 17  CREATININE 1.39*  CALCIUM 9.5  AST 23  ALT 13*  ALKPHOS 56  BILITOT 1.1   ------------------------------------------------------------------------------------------------------------------  Cardiac Enzymes  Recent Labs Lab 10/16/16 1356  TROPONINI <0.03   ------------------------------------------------------------------------------------------------------------------  RADIOLOGY:  Ct Angio  Head W Or Wo Contrast  Result Date: 10/15/2016 CLINICAL DATA:  Bilateral leg weakness. Arterial stricture/occlusion of head neck EXAM: CT ANGIOGRAPHY HEAD AND NECK TECHNIQUE: Multidetector CT imaging of the head and neck was performed using the standard protocol during bolus administration of intravenous contrast. Multiplanar CT image reconstructions and MIPs were obtained to evaluate the vascular anatomy. Carotid stenosis measurements (when applicable) are obtained utilizing NASCET criteria, using the distal internal carotid diameter as the denominator. CONTRAST:  125 mL Isovue 370 IV COMPARISON:  CT head 10/15/2016.  PET-CT 07/05/2009 FINDINGS: CTA NECK FINDINGS Aortic arch: Extensive atherosclerotic calcification in the aortic arch and proximal great vessels Right carotid system: Mild thickening of the proximal internal carotid artery without significant stenosis. Left carotid system: Atherosclerotic calcification left common carotid artery. Extensive calcification at the carotid bifurcation. Moderate stenosis of the proximal left internal carotid artery due to calcific stenosis. Estimated 60% diameter stenosis. Vertebral arteries: Calcific stenosis at the origin of the vertebral artery bilaterally. Diffuse atherosclerotic disease throughout both vertebral arteries. Calcific stenosis distal left vertebral artery. Skeleton: No acute skeletal lesion. ACDF C3 through C7. Pseudarthrosis at C3-4. Solid fusion C4 through C7. Other neck: Largest soft tissue mass at the base of the neck on the right measures 4 x 5 cm. The lesion is similar in size to a  CT neck of 07/04/2009. No other neck adenopathy Upper chest: Apical scarring on the right. Chronic right rib fractures with pleural scarring. Calcified pleural plaque left apex. Subpleural blebs on the left apex. Review of the MIP images confirms the above findings CTA HEAD FINDINGS Anterior circulation: Severe atherosclerotic calcification in the cavernous carotid  diffusely and bilaterally causing moderate bilateral stenosis. Anterior and middle cerebral artery is patent bilaterally without large vessel occlusion or significant proximal stenosis. Posterior circulation: Moderate calcific stenosis distal left vertebral artery. Mild stenosis distal right vertebral artery. Basilar is mildly irregular but patent. Left PICA patent. Right AICA patent. Superior cerebellar and posterior cerebral artery is patent bilaterally without significant stenosis. Venous sinuses: Patent Anatomic variants: None Delayed phase: Not performed Review of the MIP images confirms the above findings IMPRESSION: Severe diffuse atherosclerotic disease. Probable right carotid endarterectomy without stenosis. 60% diameter stenosis proximal left internal carotid artery due to heavily calcified plaque Heavily calcified cavernous carotid bilaterally with moderate stenosis bilaterally. No significant intracranial stenosis. No large vessel occlusion 4 x 5 cm soft tissue mass at the base the neck on the right, similar to 2011. Electronically Signed   By: Franchot Gallo M.D.   On: 10/15/2016 11:33   Ct Angio Neck W And/or Wo Contrast  Result Date: 10/15/2016 CLINICAL DATA:  Bilateral leg weakness. Arterial stricture/occlusion of head neck EXAM: CT ANGIOGRAPHY HEAD AND NECK TECHNIQUE: Multidetector CT imaging of the head and neck was performed using the standard protocol during bolus administration of intravenous contrast. Multiplanar CT image reconstructions and MIPs were obtained to evaluate the vascular anatomy. Carotid stenosis measurements (when applicable) are obtained utilizing NASCET criteria, using the distal internal carotid diameter as the denominator. CONTRAST:  125 mL Isovue 370 IV COMPARISON:  CT head 10/15/2016.  PET-CT 07/05/2009 FINDINGS: CTA NECK FINDINGS Aortic arch: Extensive atherosclerotic calcification in the aortic arch and proximal great vessels Right carotid system: Mild thickening of  the proximal internal carotid artery without significant stenosis. Left carotid system: Atherosclerotic calcification left common carotid artery. Extensive calcification at the carotid bifurcation. Moderate stenosis of the proximal left internal carotid artery due to calcific stenosis. Estimated 60% diameter stenosis. Vertebral arteries: Calcific stenosis at the origin of the vertebral artery bilaterally. Diffuse atherosclerotic disease throughout both vertebral arteries. Calcific stenosis distal left vertebral artery. Skeleton: No acute skeletal lesion. ACDF C3 through C7. Pseudarthrosis at C3-4. Solid fusion C4 through C7. Other neck: Largest soft tissue mass at the base of the neck on the right measures 4 x 5 cm. The lesion is similar in size to a CT neck of 07/04/2009. No other neck adenopathy Upper chest: Apical scarring on the right. Chronic right rib fractures with pleural scarring. Calcified pleural plaque left apex. Subpleural blebs on the left apex. Review of the MIP images confirms the above findings CTA HEAD FINDINGS Anterior circulation: Severe atherosclerotic calcification in the cavernous carotid diffusely and bilaterally causing moderate bilateral stenosis. Anterior and middle cerebral artery is patent bilaterally without large vessel occlusion or significant proximal stenosis. Posterior circulation: Moderate calcific stenosis distal left vertebral artery. Mild stenosis distal right vertebral artery. Basilar is mildly irregular but patent. Left PICA patent. Right AICA patent. Superior cerebellar and posterior cerebral artery is patent bilaterally without significant stenosis. Venous sinuses: Patent Anatomic variants: None Delayed phase: Not performed Review of the MIP images confirms the above findings IMPRESSION: Severe diffuse atherosclerotic disease. Probable right carotid endarterectomy without stenosis. 60% diameter stenosis proximal left internal carotid artery due to heavily calcified plaque  Heavily calcified cavernous carotid bilaterally with moderate stenosis bilaterally. No significant intracranial stenosis. No large vessel occlusion 4 x 5 cm soft tissue mass at the base the neck on the right, similar to 2011. Electronically Signed   By: Franchot Gallo M.D.   On: 10/15/2016 11:33   Mr Brain Wo Contrast  Result Date: 10/16/2016 CLINICAL DATA:  Initial evaluation for acute stroke. EXAM: MRI HEAD WITHOUT CONTRAST TECHNIQUE: Multiplanar, multiecho pulse sequences of the brain and surrounding structures were obtained without intravenous contrast. COMPARISON:  Prior CT from earlier the same day. FINDINGS: Brain: Diffuse prominence of the CSF containing spaces is compatible with generalized cerebral atrophy. Advanced chronic microvascular ischemic disease again noted. Remote lacunar infarcts within the left thalamus and pons. Scattered bilateral remote cerebellar infarcts, with additional remote right parietal cortical infarct. Changes are stable from previous. There is subtle diffusion abnormality involving the right paramedian ventral pons (series 100, image 16). Subtle signal loss seen on corresponding ADC map (series 4, image 16). Finding consistent with acute ischemic infarct. Area of involvement measures approximately 13 x 14 mm. No associated hemorrhage or mass effect. No other evidence for acute ischemia. Chronic micro hemorrhages within the left cerebellum and pons noted. No mass lesion or midline shift. No mass effect. No hydrocephalus. No extra-axial fluid collection. Major dural sinuses are grossly patent. Pituitary gland suprasellar region within normal limits. Vascular: Major intracranial vascular flow voids are maintained. Skull and upper cervical spine: Craniocervical junction within normal limits. Postsurgical changes noted within the upper cervical spine. Bone marrow signal intensity within normal limits. Scalp soft tissues unremarkable. Sinuses/Orbits: Globes and orbital soft tissues  within normal limits. Patient status post lens extraction bilaterally. Extensive paranasal sinus disease again noted, stable. No significant mastoid effusion. Inner ear structures normal. IMPRESSION: 1. 14 mm acute ischemic nonhemorrhagic infarct involving the right paramedian ventral pons. No associated mass effect. 2. Otherwise stable atrophy with severe chronic ischemic disease as above. Electronically Signed   By: Jeannine Boga M.D.   On: 10/16/2016 16:51   Mr Jeri Cos JK Contrast  Result Date: 10/15/2016 CLINICAL DATA:  83 year old male with new onset bilateral lower extremity weakness, left facial droop and weakness. EXAM: MRI HEAD WITHOUT AND WITH CONTRAST TECHNIQUE: Multiplanar, multiecho pulse sequences of the brain and surrounding structures were obtained without and with intravenous contrast. CONTRAST:  55mL MULTIHANCE GADOBENATE DIMEGLUMINE 529 MG/ML IV SOLN COMPARISON:  CTA head and neck 1056 hours today. Brain MRI 07/03/2009. FINDINGS: Brain: No restricted diffusion or evidence of acute infarction. Patchy chronic encephalomalacia in the right parietal lobe re- identified along with numerous chronic patchy in lacunar infarcts in both cerebellar hemispheres, the pons (progressed on the left since 2011), bilateral thalami and basal ganglia. Underlying extensive T2 heterogeneity in the deep gray matter nuclei compatible with a degree of dilated perivascular spaces. Associated widespread patchy cerebral white matter T2 and FLAIR hyperintensity. Small chronic microhemorrhage in the ventral pons. No abnormal enhancement or dural thickening. No restricted diffusion to suggest acute infarction. No midline shift, mass effect, evidence of mass lesion, ventriculomegaly, extra-axial collection or acute intracranial hemorrhage. Cervicomedullary junction and pituitary are within normal limits. Vascular: Major intracranial vascular flow voids are stable compared to 2011. Skull and upper cervical spine:  Previous cervical ACDF. Normal bone marrow signal. Sinuses/Orbits: Stable and negative orbits soft tissues. Progressed chronic paranasal sinus disease since 2011, with inspissated material now throughout the left sphenoid sinus. Other: Mastoids are clear. Visible internal auditory structures appear normal. Negative scalp and  orbits soft tissues. IMPRESSION: Severe chronic ischemic disease but no acute infarct or acute intracranial abnormality identified. Electronically Signed   By: Genevie Ann M.D.   On: 10/15/2016 16:15   Ct Head Code Stroke Wo Contrast  Result Date: 10/16/2016 CLINICAL DATA:  Code stroke. Generalized bilateral weakness. Slurred speech. EXAM: CT HEAD WITHOUT CONTRAST TECHNIQUE: Contiguous axial images were obtained from the base of the skull through the vertex without intravenous contrast. COMPARISON:  Brain MRI 10/15/2016 FINDINGS: Brain: No mass lesion or acute hemorrhage. No focal hypoattenuation of the basal ganglia or cortex to indicate infarcted tissue. There is generalized mild for age atrophy. There is periventricular hypoattenuation compatible with chronic microvascular disease. Old left pontine infarct and bilateral old cerebellar infarcts. Vascular: No hyperdense vessel. No advanced atherosclerotic calcification of the arteries at the skull base. Skull: Normal visualized skull base, calvarium and extracranial soft tissues. Sinuses/Orbits: Near complete opacification of the visualized left ethmoid, sphenoid and maxillary sinuses. Normal orbits. ASPECTS [facility] Stroke Program Early CT Score) - Ganglionic level infarction (caudate, lentiform nuclei, internal capsule, insula, M1-M3 cortex): 7 - Supraganglionic infarction (M4-M6 cortex): 3 Total score (0-10 with 10 being normal): 10 IMPRESSION: 1. No acute hemorrhage or mass lesion. 2. Severe chronic microvascular ischemia. 3. ASPECTS is 10. These results were called by telephone at the time of interpretation on 10/16/2016 at 1:57 pm to Dr.  Delman Kitten , who verbally acknowledged these results. Electronically Signed   By: Ulyses Jarred M.D.   On: 10/16/2016 14:02   Ct Head Code Stroke Wo Contrast  Result Date: 10/15/2016 CLINICAL DATA:  Code stroke. 83 year old male with onset of dizziness and weakness since 2230 hours. Facial droop. EXAM: CT HEAD WITHOUT CONTRAST TECHNIQUE: Contiguous axial images were obtained from the base of the skull through the vertex without intravenous contrast. COMPARISON:  Head CT without contrast 02/17/2010 and earlier. FINDINGS: Brain: Mild cerebral volume loss since 2011. Areas of chronic appearing encephalomalacia in the right parietal lobe and both cerebellar hemispheres with progression since 2011. Patchy and confluent bilateral cerebral white matter hypodensity also appears somewhat progressed since 2011 with bilateral deep gray matter involvement. New confluent hypodensity in the left paracentral pons (series 3, image 8) which could be chronic but is age indeterminate. No acute intracranial hemorrhage identified. No midline shift, mass effect, or evidence of intracranial mass lesion. No definite acute cortically based infarct identified. Vascular: Extensive Calcified atherosclerosis at the skull base. No suspicious intracranial vascular hyperdensity. Skull: No acute osseous abnormality identified. Sinuses/Orbits: Chronic bilateral paranasal sinus disease with mucoperiosteal thickening. Progression in the left sphenoid sinus since 2011. Chronic postoperative changes to the frontal sinuses. Tympanic cavities and mastoids remain clear. Other: No acute orbit or scalp soft tissue finding. Postoperative changes to both globes. ASPECTS [facility] Stroke Program Early CT Score) - Ganglionic level infarction (caudate, lentiform nuclei, internal capsule, insula, M1-M3 cortex): 7 - Supraganglionic infarction (M4-M6 cortex): 3 Total score (0-10 with 10 being normal): 10 IMPRESSION: 1. Chronic ischemic disease with age  indeterminate lacune in the left paracentral pons, and bilateral white matter progression since 2011. 2. Progressed but chronic appearing cortical infarcts in the right parietal lobe and both cerebellar hemispheres. ASPECTS is 10. 3. No acute intracranial hemorrhage or mass effect. 4. Progressed chronic paranasal sinus disease. 5. Study discussed by telephone with Dr. Larae Grooms on 10/15/2016 at 09:38 . Electronically Signed   By: Genevie Ann M.D.   On: 10/15/2016 09:40    EKG:  sinus rhythm  IMPRESSION AND  PLAN:   Steve Bryant  is a 83 y.o. male with a known history of CVA in the remote past, hypertension, arthritis, AAA was admitted 10/15/2016 discharge 10/16/2016 with TIA comes back to the emergency room after patient got discharged went out to eat and per daughter she was not able to move his left upper and lower extremity  1. Acute right pons lacunar infarct. Patient has history of CVA in the past -Admitted to [facility] floor -Aspirin plus Plavix (just started yesterday) -Statins -Physical therapy to see patient -Echo of the heart  2. Hypertension -Resume home meds  3. Hyperlipidemia -On Crestor  4. DVT prophylaxis subcutaneous Lovenox  5. Glaucoma continue eyedrops  Above was discussed with patient and patient's daughter at length. Discussed with Dr Lyda Perone.    All the records are reviewed and case discussed with ED provider. Management plans discussed with the patient, family and they are in agreement.  CODE STATUS: FULL  TOTAL TIME TAKING CARE OF THIS PATIENT: 50 minutes.    Antoinette Borgwardt M.D on 10/16/2016 at 5:08 PM  Between 7am to 6pm - Pager - [phone]  After 6pm go to [email or web address] - password [email or web address] [facility]  [employer] [facility]  Office  [phone]  CC: Primary care physician; [name], MD

## 2016-10-16 NOTE — Evaluation (Addendum)
Clinical/Bedside Swallow Evaluation Patient Details  Name: TOVIA KISNER MRN: [id number] Date of Birth: [birth year]  Today's Date: 10/16/2016 Time: SLP Start Time (ACUTE ONLY): 1000 SLP Stop Time (ACUTE ONLY): 1100 SLP Time Calculation (min) (ACUTE ONLY): 60 min  Past Medical History:  Past Medical History:  Diagnosis Date  . AAA (abdominal aortic aneurysm) ([place])   . Arthritis   . Glaucoma   . Hypertension   . Stroke [facility])    Past Surgical History:  Past Surgical History:  Procedure Laterality Date  . EYE SURGERY    . neck fusion    . TUMOR REMOVAL     HPI:  Pt  is an 83 y.o. male with history of Stroke, hypertension, glaucoma - legally blind comes to the emergency room from home with complaints of weakness in both lower extremity weakness and states could not get out of bed to walk. Symptoms were short lived. Pt is very active at baseline. Today, pt reports he continues to feel "a little weak overall; I cannot believe you can feel this week after only one day in the bed". Pt denied any swallowing difficulties; noted slight-min Left labial weakness and decreased tone in corner of mouth. Pt is verbally conversive and A/O x4. He is eager to get "down to [place]" as he usually does each year; he lives alone at home(Wife passed in March).   Assessment / Plan / Recommendation Clinical Impression  Pt appears to exhibit adequate oropharyngeal phase swallow function w/ no overt s/s of aspiration noted w/ po trials during breakfast meal; no overt oral phase deficits noted during bolus management and oral clearing. Although pt does exhibit min Left labial decreased tone and strength in the labial and buccal areas; speech is c/b slight-min dysarthria w/ certain speech sounds. Lingual strength and ROM are adequate and help to compensate. MRI negative for acute intracranial abnormality, although, severe chronic ischemic disease is described on MRI. Pt/Daughter has been told by Neurology that he  has had a "like a mini stroke". Recommend continue a regular diet which he appears to be tolerating well per NSG report. Pt briefly assessed for the dysarthria as the Left oral weakness is noted during BSE. Pt is conversing at conversational level; pt and family feel his speech is "much better today but it has been a little slurred". Pt appeared to exhibit slight-min dysarthria of speech impacting certain speech sounds slightly. No dysfluency of speech noted in words or general conversation. Pt does state his speech is "just not quite right yet" but "that by talking more, it will get there; I haven't been talking much for two days". Noted intelligibility of speech was 100% during words and conversation today; pt/Daughter agreed. NSG reported same. Pt was instructed on general Dysarthria strategies to include taking his time and slowing down when talking; overarticulating - talking BIGGER w/ a little more volume. No Receptive or other Expressive language deficits noted; no Cognitive deficits noted during this brief interaction. Pt is eager to discharge home today. He declined recommendation ofr [place] [facility] therapy services at this time. Recommended to pt to continue to follow general articulation strategies and f/u w/ primary MD upon discharge for referral to [facility] skilled ST services for f/u w/ formal Dysarthria evaluation/tx if he feels his speech has not returned to his baseline in the next few days after returning home to family/work. Pt/family gave verbal agreement; NSG/MD updated.  SLP Visit Diagnosis: Dysphagia, unspecified (R13.10);Dysarthria and anarthria (R47.1)    Aspiration Risk   (  reduced risk for aspiration)    Diet Recommendation  Regular diet; general aspiration precautions  Medication Administration: Whole meds with liquid (or w/ a puree if needed for easier swallowing)    Other  Recommendations Recommended Consults:  (n/a) Oral Care Recommendations: Oral care BID;Patient independent with oral  care   Follow up Recommendations None; recommend f/u w/ primary MD post d/c home if desiring to seek consult w/ [place] services; Handouts given on OMEs; articulation strategies.      Frequency and Duration            Prognosis Prognosis for Safe Diet Advancement: Good      Swallow Study   General Date of Onset: 10/15/16 HPI: Pt  is an 83 y.o. male with history of Stroke, hypertension, glaucoma - legally blind comes to the emergency room from home with complaints of weakness in both lower extremity weakness and states could not get out of bed to walk. Symptoms were short lived. Pt is very active at baseline. Today, pt reports he continues to feel "a little weak overall; I cannot believe you can feel this week after only one day in the bed". Pt denied any swallowing difficulties; noted slight-min Left labial weakness and decreased tone in corner of mouth. Pt is verbally conversive and A/O x4. He is eager to get "down to [place]" as he usually does each year; he lives alone at home(Wife passed in March). Type of Study: Bedside Swallow Evaluation Previous Swallow Assessment: none Diet Prior to this Study: Regular;Thin liquids Temperature Spikes Noted: No Respiratory Status: Room air History of Recent Intubation: No Behavior/Cognition: Alert;Cooperative;Pleasant mood (blind) Oral Cavity Assessment: Within Functional Limits;Dry (min) Oral Care Completed by SLP: Recent completion by staff Oral Cavity - Dentition: Adequate natural dentition Vision:  (does feed himself independently) Self-Feeding Abilities: Able to feed self;Needs set up Patient Positioning: Upright in chair Baseline Vocal Quality: Normal;Low vocal intensity (min) Volitional Cough: Strong Volitional Swallow: Able to elicit    Oral/Motor/Sensory Function Overall Oral Motor/Sensory Function: Mild impairment Facial ROM: Reduced left Facial Symmetry: Abnormal symmetry left Facial Strength: Reduced left Facial Sensation:  Reduced left Lingual ROM: Within Functional Limits Lingual Symmetry: Within Functional Limits Lingual Strength: Within Functional Limits Mandible: Within Functional Limits   Ice Chips Ice chips: Not tested   Thin Liquid Thin Liquid: Within functional limits Presentation: Cup;Self Fed (several sips at one time) Other Comments: no overt s/s of aspiration noted; no oral phase deficits noted    Nectar Thick Nectar Thick Liquid: Not tested   Honey Thick Honey Thick Liquid: Not tested   Puree Puree: Not tested   Solid   GO   Solid: Within functional limits Presentation: Self Fed;Spoon (several bites of omelette) Other Comments: no deficits noted during intake    Functional Assessment Tool Used: clinical judgement Functional Limitations: Swallowing Swallow Current Status (E2683): At least 1 percent but less than 20 percent impaired, limited or restricted Swallow Goal Status [phone]): At least 1 percent but less than 20 percent impaired, limited or restricted Swallow Discharge Status [phone]): At least 1 percent but less than 20 percent impaired, limited or restricted     Orinda Kenner, MS, CCC-SLP Watson,Katherine 10/16/2016,11:48 AM

## 2016-10-16 NOTE — ED Notes (Addendum)
Pts weakness in the left arm and left leg are resolving, pt is now able to lift left arm and leg and hold them off the bed. Left side facial droop is still present.

## 2016-10-16 NOTE — ED Notes (Signed)
Pt assisted to restroom by this RN and Ryland, Therapist, sports. Pt is unsteady on his feet. And requires assistance with ambulation

## 2016-10-16 NOTE — Care Management Note (Signed)
[facility] Bryant Note  Patient Details  Name: Steve Bryant MRN: [id number] Date of Birth: 20-Feb-1926  Subjective/Objective:    Admitted to [facility] with the diagnosis of CVA. Lives alone. Daughter is Steve Bryant [phone]). Prescriptions are filled at [place], No home health. No skilled facility. No home oxygen. Rolling walker in the home, if needed. Takes care of all basic activities of daily living himself, doesn't drive. Family helps with errands. States he has med alert on his telephone. Falls frequently. Good appetite. Family will transport.              Action/Plan: Physical therapy evaluation completed. Recommending home with physical therapy. Steve Bryant. Steve Bryant at [facility] updated. Discharge to home today per Dr. Tressia Miners.   Expected Discharge Date:                  Expected Discharge Plan:     In-House Referral:     Discharge planning Services   yes  Post Acute Care Choice:   yes Choice offered to:   Steve Bryant  DME Arranged:   none DME Agency:     HH Arranged:   yes Enterprise Agency:   [facility]  Status of Service:   completed  If discussed at [place] of Stay Meetings, dates discussed:    Additional Comments:  Steve Bryant, Steve Bryant [phone] 10/16/2016, 10:34 AM

## 2016-10-16 NOTE — Progress Notes (Signed)
Discharge instructions along with home medications and follow up gone over with patient and daughter. Both verbalize that they understood instructions. No prescriptions given to patient. IV's and tele removed. Pt being discharged home on room air, no distress noted. Vedh Ptacek S Fenton, RN 

## 2016-10-16 NOTE — ED Triage Notes (Signed)
Pt to ED via POV, pt dtr states that pt was d/c from the hospital today. On the way home pt started experiencing weakness on the left side. Pt has left sided facial droop and weakness on the left side. Dr. Jacqualine Code at bedside.

## 2016-10-16 NOTE — ED Notes (Signed)
Pt is in MRI  

## 2016-10-16 NOTE — Care Management Obs Status (Signed)
[place] NOTIFICATION   Patient Details  Name: KAREEN HITSMAN MRN: [id number] Date of Birth: 30-May-1925   Medicare Observation Status Notification Given:  Yes    Shelbie Ammons, RN 10/16/2016, 11:31 AM

## 2016-10-16 NOTE — Care Management CC44 (Signed)
Condition Code 44 Documentation Completed  Patient Details  Name: ANTAR MILKS MRN: [id number] Date of Birth: [birth year]   Condition Code 44 given:  Yes Patient signature on Condition Code 44 notice:  Yes Documentation of 2 MD's agreement:  Yes Code 44 added to claim:  Yes    Shelbie Ammons, RN 10/16/2016, 11:32 AM

## 2016-10-16 NOTE — Consult Note (Signed)
Reason for Consult: generalized weakness  Referring Physician: Dr. Tressia Miners   CC: Generalized weakness   HPI: Steve Bryant is an 83 y.o. male with history of Stroke, hypertension, glaucoma comes to the emergency room from home with complaints of weakness in both lower extremity weakness and states could not get out of bed to walk. Symptoms were short lived and pt is back to baseline.  Pt is very active at baseline.  Past Medical History:  Diagnosis Date  . AAA (abdominal aortic aneurysm) ([place])   . Arthritis   . Glaucoma   . Hypertension   . Stroke [facility])     Past Surgical History:  Procedure Laterality Date  . EYE SURGERY    . neck fusion    . TUMOR REMOVAL      Family History  Problem Relation Age of Onset  . Stroke Mother   . Stroke Father   . Cancer Brother     Social History:  reports that he has quit smoking. He has never used smokeless tobacco. He reports that he drinks alcohol. He reports that he does not use drugs.  Allergies  Allergen Reactions  . Shellfish-Derived Products Nausea Only    Scallops    Medications: I have reviewed the patient's current medications.  ROS: History obtained from the patient  General ROS: negative for - chills, fatigue, fever, night sweats, weight gain or weight loss Psychological ROS: negative for - behavioral disorder, hallucinations, memory difficulties, mood swings or suicidal ideation Ophthalmic ROS: negative for - blurry vision, double vision, eye pain or loss of vision ENT ROS: negative for - epistaxis, nasal discharge, oral lesions, sore throat, tinnitus or vertigo Allergy and Immunology ROS: negative for - hives or itchy/watery eyes Hematological and Lymphatic ROS: negative for - bleeding problems, bruising or swollen lymph nodes Endocrine ROS: negative for - galactorrhea, hair pattern changes, polydipsia/polyuria or temperature intolerance Respiratory ROS: negative for - cough, hemoptysis, shortness of breath or  wheezing Cardiovascular ROS: negative for - chest pain, dyspnea on exertion, edema or irregular heartbeat Gastrointestinal ROS: negative for - abdominal pain, diarrhea, hematemesis, nausea/vomiting or stool incontinence Genito-Urinary ROS: negative for - dysuria, hematuria, incontinence or urinary frequency/urgency Musculoskeletal ROS: negative for - joint swelling or muscular weakness Neurological ROS: as noted in HPI Dermatological ROS: negative for rash and skin lesion changes  Physical Examination: Blood pressure 120/74, pulse 64, temperature 98.6 F (37 C), temperature source Oral, resp. rate 16, height 5\' 10"  (1.778 m), weight 68 kg (150 lb), SpO2 98 %.   Neurological Examination   Mental Status: Alert, oriented, thought content appropriate.  Speech fluent without evidence of aphasia.  Able to follow 3 step commands without difficulty. Cranial Nerves: II: Discs flat bilaterally; Visual fields grossly normal, pupils equal, round, reactive to light and accommodation III,IV, VI: ptosis not present, extra-ocular motions intact bilaterally V,VII: smile symmetric, facial light touch sensation normal bilaterally VIII: hearing normal bilaterally IX,X: gag reflex present XI: bilateral shoulder shrug XII: midline tongue extension Motor: Right : Upper extremity   5/5    Left:     Upper extremity   5/5  Lower extremity   5/5     Lower extremity   5/5 Tone and bulk:normal tone throughout; no atrophy noted Sensory: Pinprick and light touch intact throughout, bilaterally Deep Tendon Reflexes: 1+ and symmetric throughout Plantars: Right: downgoing   Left: downgoing Cerebellar: normal finger-to-nose, normal rapid alternating movements and normal heel-to-shin test Gait: not tested  Laboratory Studies:   Basic Metabolic Panel:  Recent Labs Lab 10/15/16 0919  NA 144  K 4.1  CL 110  CO2 27  GLUCOSE 106*  BUN 20  CREATININE 1.16  CALCIUM 9.4    Liver Function  Tests:  Recent Labs Lab 10/15/16 0919  AST 24  ALT 13*  ALKPHOS 54  BILITOT 0.9  PROT 6.6  ALBUMIN 3.8   No results for input(s): LIPASE, AMYLASE in the last 168 hours. No results for input(s): AMMONIA in the last 168 hours.  CBC:  Recent Labs Lab 10/15/16 0919  WBC 9.1  NEUTROABS 5.7  HGB 14.5  HCT 44.5  MCV 87.7  PLT 231    Cardiac Enzymes:  Recent Labs Lab 10/15/16 0919  TROPONINI <0.03    BNP: Invalid input(s): POCBNP  CBG:  Recent Labs Lab 10/15/16 0921  GLUCAP 37    Microbiology: Results for orders placed or performed in visit on 02/27/12  Influenza A&B Antigens [facility])     Status: None   Collection Time: 02/27/12  5:07 AM  Result Value Ref Range Status   Micro Text Report   Final       COMMENT                   NEGATIVE FOR INFLUENZA A (ANTIGEN ABSENT)   COMMENT                   NEGATIVE FOR INFLUENZA B (ANTIGEN ABSENT)   ANTIBIOTIC                                                        Coagulation Studies:  Recent Labs  10/15/16 0919  LABPROT 13.6  INR 1.04    Urinalysis: No results for input(s): COLORURINE, LABSPEC, PHURINE, GLUCOSEU, HGBUR, BILIRUBINUR, KETONESUR, PROTEINUR, UROBILINOGEN, NITRITE, LEUKOCYTESUR in the last 168 hours.  Invalid input(s): APPERANCEUR  Lipid Panel:  No results found for: CHOL, TRIG, HDL, CHOLHDL, VLDL, LDLCALC  HgbA1C: No results found for: HGBA1C  Urine Drug Screen:  No results found for: LABOPIA, COCAINSCRNUR, LABBENZ, AMPHETMU, THCU, LABBARB  Alcohol Level: No results for input(s): ETH in the last 168 hours.   Imaging: Ct Angio Head W Or Wo Contrast  Result Date: 10/15/2016 CLINICAL DATA:  Bilateral leg weakness. Arterial stricture/occlusion of head neck EXAM: CT ANGIOGRAPHY HEAD AND NECK TECHNIQUE: Multidetector CT imaging of the head and neck was performed using the standard protocol during bolus administration of intravenous contrast. Multiplanar CT image reconstructions and MIPs were  obtained to evaluate the vascular anatomy. Carotid stenosis measurements (when applicable) are obtained utilizing NASCET criteria, using the distal internal carotid diameter as the denominator. CONTRAST:  125 mL Isovue 370 IV COMPARISON:  CT head 10/15/2016.  PET-CT 07/05/2009 FINDINGS: CTA NECK FINDINGS Aortic arch: Extensive atherosclerotic calcification in the aortic arch and proximal great vessels Right carotid system: Mild thickening of the proximal internal carotid artery without significant stenosis. Left carotid system: Atherosclerotic calcification left common carotid artery. Extensive calcification at the carotid bifurcation. Moderate stenosis of the proximal left internal carotid artery due to calcific stenosis. Estimated 60% diameter stenosis. Vertebral arteries: Calcific stenosis at the origin of the vertebral artery bilaterally. Diffuse atherosclerotic disease throughout both vertebral arteries. Calcific stenosis distal left vertebral artery. Skeleton: No acute skeletal lesion. ACDF C3 through C7.  Pseudarthrosis at C3-4. Solid fusion C4 through C7. Other neck: Largest soft tissue mass at the base of the neck on the right measures 4 x 5 cm. The lesion is similar in size to a CT neck of 07/04/2009. No other neck adenopathy Upper chest: Apical scarring on the right. Chronic right rib fractures with pleural scarring. Calcified pleural plaque left apex. Subpleural blebs on the left apex. Review of the MIP images confirms the above findings CTA HEAD FINDINGS Anterior circulation: Severe atherosclerotic calcification in the cavernous carotid diffusely and bilaterally causing moderate bilateral stenosis. Anterior and middle cerebral artery is patent bilaterally without large vessel occlusion or significant proximal stenosis. Posterior circulation: Moderate calcific stenosis distal left vertebral artery. Mild stenosis distal right vertebral artery. Basilar is mildly irregular but patent. Left PICA patent. Right  AICA patent. Superior cerebellar and posterior cerebral artery is patent bilaterally without significant stenosis. Venous sinuses: Patent Anatomic variants: None Delayed phase: Not performed Review of the MIP images confirms the above findings IMPRESSION: Severe diffuse atherosclerotic disease. Probable right carotid endarterectomy without stenosis. 60% diameter stenosis proximal left internal carotid artery due to heavily calcified plaque Heavily calcified cavernous carotid bilaterally with moderate stenosis bilaterally. No significant intracranial stenosis. No large vessel occlusion 4 x 5 cm soft tissue mass at the base the neck on the right, similar to 2011. Electronically Signed   By: Franchot Gallo M.D.   On: 10/15/2016 11:33   Ct Angio Neck W And/or Wo Contrast  Result Date: 10/15/2016 CLINICAL DATA:  Bilateral leg weakness. Arterial stricture/occlusion of head neck EXAM: CT ANGIOGRAPHY HEAD AND NECK TECHNIQUE: Multidetector CT imaging of the head and neck was performed using the standard protocol during bolus administration of intravenous contrast. Multiplanar CT image reconstructions and MIPs were obtained to evaluate the vascular anatomy. Carotid stenosis measurements (when applicable) are obtained utilizing NASCET criteria, using the distal internal carotid diameter as the denominator. CONTRAST:  125 mL Isovue 370 IV COMPARISON:  CT head 10/15/2016.  PET-CT 07/05/2009 FINDINGS: CTA NECK FINDINGS Aortic arch: Extensive atherosclerotic calcification in the aortic arch and proximal great vessels Right carotid system: Mild thickening of the proximal internal carotid artery without significant stenosis. Left carotid system: Atherosclerotic calcification left common carotid artery. Extensive calcification at the carotid bifurcation. Moderate stenosis of the proximal left internal carotid artery due to calcific stenosis. Estimated 60% diameter stenosis. Vertebral arteries: Calcific stenosis at the origin of  the vertebral artery bilaterally. Diffuse atherosclerotic disease throughout both vertebral arteries. Calcific stenosis distal left vertebral artery. Skeleton: No acute skeletal lesion. ACDF C3 through C7. Pseudarthrosis at C3-4. Solid fusion C4 through C7. Other neck: Largest soft tissue mass at the base of the neck on the right measures 4 x 5 cm. The lesion is similar in size to a CT neck of 07/04/2009. No other neck adenopathy Upper chest: Apical scarring on the right. Chronic right rib fractures with pleural scarring. Calcified pleural plaque left apex. Subpleural blebs on the left apex. Review of the MIP images confirms the above findings CTA HEAD FINDINGS Anterior circulation: Severe atherosclerotic calcification in the cavernous carotid diffusely and bilaterally causing moderate bilateral stenosis. Anterior and middle cerebral artery is patent bilaterally without large vessel occlusion or significant proximal stenosis. Posterior circulation: Moderate calcific stenosis distal left vertebral artery. Mild stenosis distal right vertebral artery. Basilar is mildly irregular but patent. Left PICA patent. Right AICA patent. Superior cerebellar and posterior cerebral artery is patent bilaterally without significant stenosis. Venous sinuses: Patent Anatomic variants: None Delayed  phase: Not performed Review of the MIP images confirms the above findings IMPRESSION: Severe diffuse atherosclerotic disease. Probable right carotid endarterectomy without stenosis. 60% diameter stenosis proximal left internal carotid artery due to heavily calcified plaque Heavily calcified cavernous carotid bilaterally with moderate stenosis bilaterally. No significant intracranial stenosis. No large vessel occlusion 4 x 5 cm soft tissue mass at the base the neck on the right, similar to 2011. Electronically Signed   By: Franchot Gallo M.D.   On: 10/15/2016 11:33   Mr Jeri Cos HB Contrast  Result Date: 10/15/2016 CLINICAL DATA:   83 year old male with new onset bilateral lower extremity weakness, left facial droop and weakness. EXAM: MRI HEAD WITHOUT AND WITH CONTRAST TECHNIQUE: Multiplanar, multiecho pulse sequences of the brain and surrounding structures were obtained without and with intravenous contrast. CONTRAST:  67mL MULTIHANCE GADOBENATE DIMEGLUMINE 529 MG/ML IV SOLN COMPARISON:  CTA head and neck 1056 hours today. Brain MRI 07/03/2009. FINDINGS: Brain: No restricted diffusion or evidence of acute infarction. Patchy chronic encephalomalacia in the right parietal lobe re- identified along with numerous chronic patchy in lacunar infarcts in both cerebellar hemispheres, the pons (progressed on the left since 2011), bilateral thalami and basal ganglia. Underlying extensive T2 heterogeneity in the deep gray matter nuclei compatible with a degree of dilated perivascular spaces. Associated widespread patchy cerebral white matter T2 and FLAIR hyperintensity. Small chronic microhemorrhage in the ventral pons. No abnormal enhancement or dural thickening. No restricted diffusion to suggest acute infarction. No midline shift, mass effect, evidence of mass lesion, ventriculomegaly, extra-axial collection or acute intracranial hemorrhage. Cervicomedullary junction and pituitary are within normal limits. Vascular: Major intracranial vascular flow voids are stable compared to 2011. Skull and upper cervical spine: Previous cervical ACDF. Normal bone marrow signal. Sinuses/Orbits: Stable and negative orbits soft tissues. Progressed chronic paranasal sinus disease since 2011, with inspissated material now throughout the left sphenoid sinus. Other: Mastoids are clear. Visible internal auditory structures appear normal. Negative scalp and orbits soft tissues. IMPRESSION: Severe chronic ischemic disease but no acute infarct or acute intracranial abnormality identified. Electronically Signed   By: Genevie Ann M.D.   On: 10/15/2016 16:15   Ct Head Code Stroke  Wo Contrast  Result Date: 10/15/2016 CLINICAL DATA:  Code stroke. 83 year old male with onset of dizziness and weakness since 2230 hours. Facial droop. EXAM: CT HEAD WITHOUT CONTRAST TECHNIQUE: Contiguous axial images were obtained from the base of the skull through the vertex without intravenous contrast. COMPARISON:  Head CT without contrast 02/17/2010 and earlier. FINDINGS: Brain: Mild cerebral volume loss since 2011. Areas of chronic appearing encephalomalacia in the right parietal lobe and both cerebellar hemispheres with progression since 2011. Patchy and confluent bilateral cerebral white matter hypodensity also appears somewhat progressed since 2011 with bilateral deep gray matter involvement. New confluent hypodensity in the left paracentral pons (series 3, image 8) which could be chronic but is age indeterminate. No acute intracranial hemorrhage identified. No midline shift, mass effect, or evidence of intracranial mass lesion. No definite acute cortically based infarct identified. Vascular: Extensive Calcified atherosclerosis at the skull base. No suspicious intracranial vascular hyperdensity. Skull: No acute osseous abnormality identified. Sinuses/Orbits: Chronic bilateral paranasal sinus disease with mucoperiosteal thickening. Progression in the left sphenoid sinus since 2011. Chronic postoperative changes to the frontal sinuses. Tympanic cavities and mastoids remain clear. Other: No acute orbit or scalp soft tissue finding. Postoperative changes to both globes. ASPECTS [facility] 352 Stroke Program Early CT Score) - Ganglionic level infarction (caudate, lentiform nuclei, internal capsule, insula,  M1-M3 cortex): 7 - Supraganglionic infarction (M4-M6 cortex): 3 Total score (0-10 with 10 being normal): 10 IMPRESSION: 1. Chronic ischemic disease with age indeterminate lacune in the left paracentral pons, and bilateral white matter progression since 2011. 2. Progressed but chronic appearing cortical infarcts in  the right parietal lobe and both cerebellar hemispheres. ASPECTS is 10. 3. No acute intracranial hemorrhage or mass effect. 4. Progressed chronic paranasal sinus disease. 5. Study discussed by telephone with Dr. Larae Grooms on 10/15/2016 at 09:38 . Electronically Signed   By: Genevie Ann M.D.   On: 10/15/2016 09:40     Assessment/Plan: 83 y.o. male with history of Stroke, hypertension, glaucoma comes to the emergency room from home with complaints of weakness in both lower extremity weakness and states could not get out of bed to walk. Symptoms were short lived and pt is back to baseline.  Pt is very active at baseline.  - MRI no acute abnormalities but pt has b/l calcified carotids.   - due to calcified b/l carotids would be ok on dual anti platelet therapy he is on now. Was on ASA 81 at home - d/c planning   10/16/2016, 10:13 AM

## 2016-10-16 NOTE — Evaluation (Signed)
Physical Therapy Evaluation Patient Details Name: Steve Bryant MRN: [id number] DOB: Jul 18, 1925 Today's Date: 10/16/2016   History of Present Illness  83 y.o. male with a known history of Stroke, hypertension, glaucoma comes to the emergency room from home with complaints of weakness in both lower extremity upper extremity could not get out of bed to walk along with left facial weakness/droop. Family initially felt patient had slurred speech. Patient reports his weakness is much better. MRI was negative for acute CVA.  Clinical Impression  Pt very pleasant t/o the session and eager to work with PT. He admits to having some falls at home in the last few months (generally just to chair, etc) and states he has walkers if needed but typically does not use them.  Encouraged him to use them much more regularly when he first goes home.  Pt is generally active and exercises daily, however he is weaker and more unsteady than his baseline and will benefit from HHPT as well as some increased supervision initially.    Follow Up Recommendations Home health PT;Supervision/Assistance - 24 hour    Equipment Recommendations  None recommended by PT    Recommendations for Other Services       Precautions / Restrictions Precautions Precautions: Fall (Pt reports he has had mutliple falls in the last 6 month.) Restrictions Weight Bearing Restrictions: No      Mobility  Bed Mobility Overal bed mobility: Independent             General bed mobility comments: Pt is able to get to EOB w/o assist, easily and safely  Transfers Overall transfer level: Modified independent Equipment used: Rolling walker (2 wheeled)             General transfer comment: Pt was able to get to standing w/o AD, but was unsteady and did need UE holding PT, ultimately gave him walker to maintain balance  Ambulation/Gait Ambulation/Gait assistance: Supervision Ambulation Distance (Feet): 200 Feet Assistive device:  Rolling walker (2 wheeled)       General Gait Details: Pt with poor vision and needed constant cuing for direction, etc but was safe and confident with ambulation.  He did not have excessive fatigue and overall showed good effort.    Stairs Stairs: Yes Stairs assistance: Supervision Stair Management: Two rails;One rail Left Number of Stairs: 13 General stair comments: secondary to vision issues pt needed guidance, but was able to negotiate up/down steps w/o physical assist  Wheelchair Mobility    Modified Rankin (Stroke Patients Only)       Balance Overall balance assessment: Needs assistance Sitting-balance support: No upper extremity supported Sitting balance-Leahy Scale: Good     Standing balance support: Bilateral upper extremity supported Standing balance-Leahy Scale: Fair Standing balance comment: Pt with baseline unsteadiness, but apparently he walks considerable distances daily w/o AD.  Pt did require AD today secondary to intially having stagger steps/unsteadiness walking w/o it.                             Pertinent Vitals/Pain Pain Assessment: No/denies pain    Home Living Family/patient expects to be discharged to:: Private residence Living Arrangements: Alone Available Help at Discharge: Personal care attendant;Family;Available PRN/intermittently (Pt reports he could temporarity have 24/7 assist if needed) Type of Home: House Home Access: Stairs to enter   Entrance Stairs-Number of Steps: 3 Home Layout: Two level;Bed/bath upstairs;Able to live on main level with bedroom/bathroom (Pt does steps  daily) Home Equipment: Walker - 4 wheels;Cane - single point      Prior Function Level of Independence: Independent         Comments: Pt reports that he daily does prolonged walk (10x up/down driveway) and then the exercises bike for a few miles     Hand Dominance        Extremity/Trunk Assessment   Upper Extremity Assessment Upper Extremity  Assessment: Generalized weakness (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  Did appear to have some mild nystagmus with R posterior canal but no sensations of vertigo and only brief flicker.  Pt does have history of BPPV.   Assessment/Plan    PT Assessment Patient needs continued PT services  PT Problem List Decreased activity tolerance;Decreased mobility;Decreased safety awareness;Decreased strength;Decreased balance;Decreased coordination;Decreased knowledge of use of DME       PT Treatment Interventions DME instruction;Gait training;Stair training;Functional mobility training;Therapeutic activities;Therapeutic exercise;Balance training;Neuromuscular re-education;Patient/family education    PT Goals (Current goals can be found in the Care Plan section)  Acute Rehab PT Goals Patient Stated Goal: go home PT Goal Formulation: With patient Time For Goal Achievement: 10/30/16 Potential to Achieve Goals: Good    Frequency Min 2X/week   Barriers to discharge        Co-evaluation               AM-PAC PT "6 Clicks" Daily Activity  Outcome Measure Difficulty turning over in bed (including adjusting bedclothes, sheets and blankets)?: None Difficulty moving from lying on back to sitting on the side of the bed? : None Difficulty sitting down on and standing up from a chair with arms (e.g.,  wheelchair, bedside commode, etc,.)?: A Little Help needed moving to and from a bed to chair (including a wheelchair)?: A Little Help needed walking in hospital room?: A Little Help needed climbing 3-5 steps with a railing? : A Little 6 Click Score: 20    End of Session Equipment Utilized During Treatment: Gait belt Activity Tolerance: Patient tolerated treatment well Patient left: with bed alarm set;with call bell/phone within reach Nurse Communication: Mobility status PT Visit Diagnosis: Muscle weakness (generalized) (M62.81);Difficulty in walking, not elsewhere classified (R26.2)    Time: 1740-8144 PT Time Calculation (min) (ACUTE ONLY): 29 min   Charges:   PT Evaluation $PT Eval Low Complexity: 1 Low PT Treatments $Therapeutic Activity: 8-22 mins   PT G Codes:   PT G-Codes **NOT FOR INPATIENT CLASS** Functional Assessment Tool Used: AM-PAC 6 Clicks Basic Mobility Functional Limitation: Mobility: Walking and moving around Mobility: Walking and Moving Around Current Status ([id number]): At least 20 percent but less than 40 percent impaired, limited or restricted Mobility: Walking and Moving Around Goal Status [phone]): At least 1 percent but less than 20 percent impaired, limited or restricted    Kreg Shropshire, DPT  10/16/2016, 9:53 AM

## 2016-10-17 ENCOUNTER — Inpatient Hospital Stay
Admit: 2016-10-17 | Discharge: 2016-10-17 | Disposition: A | Discharge status: Home / Self Care | Payer: Medicare Other | Attending: Internal Medicine | Admitting: Internal Medicine

## 2016-10-17 DIAGNOSIS — I639 Cerebral infarction, unspecified: Secondary | ICD-10-CM

## 2016-10-17 LAB — LIPID PANEL
Cholesterol: 179 mg/dL (ref 0–200)
HDL: 42 mg/dL (ref >40)
LDL Cholesterol: 105 mg/dL — ABNORMAL HIGH (ref 0–99)
Total CHOL/HDL Ratio: 4.3 RATIO
Triglycerides: 161 mg/dL — ABNORMAL HIGH (ref <150)
VLDL: 32 mg/dL (ref 0–40)

## 2016-10-17 LAB — GLUCOSE, CAPILLARY: Glucose-Capillary: 105 mg/dL — ABNORMAL HIGH (ref 65–99)

## 2016-10-17 LAB — ECHOCARDIOGRAM COMPLETE

## 2016-10-17 NOTE — Evaluation (Signed)
Physical Therapy Evaluation Patient Details Name: Steve Bryant MRN: [id number] DOB: 20-May-1925 Today's Date: 10/17/2016   History of Present Illness  83 year old male pt with PMHx significant for history of CVA with no residual neurological deficits, hypertension, glaucoma, and legal blindness who presented to hospital on 8/9 with BLE weakness and L facial droop and dysarthria. Patient admitted for TIA, MRI was negative and was discharged 8/10 home. Readmitted on 8/10 with left-sided weakness and noted to have acute right pontine infarct by MRI.   Clinical Impression  Pt is demonstrating both some confusion about where he is and about his physical abilities.  He is scheduled to go to SNF but son in law is going to talk with family due to pt objection about this.  Pt is following instructions for mobility but cannot get his agreement on the dc expectations.  Pt is unable to walk alone, has no family to stay with him 24/7 to cover this.  Follow acutely for strengthening and gait training as able.    Follow Up Recommendations Supervision/Assistance - 24 hour;SNF    Equipment Recommendations  None recommended by PT    Recommendations for Other Services       Precautions / Restrictions Precautions Precautions: Fall Restrictions Weight Bearing Restrictions: No      Mobility  Bed Mobility Overal bed mobility: Needs Assistance Bed Mobility: Supine to Sit     Supine to sit: Min guard;Min assist     General bed mobility comments: pt is trying to slide over to stand and had to stop him as pt is feeling light headed  Transfers Overall transfer level: Needs assistance Equipment used: Rolling walker (2 wheeled) Transfers: Sit to/from Stand Sit to Stand: Min guard;Min assist         General transfer comment: pt needs instruction for hand placement both to sit and stand, does not remember instructions after cues  Ambulation/Gait Ambulation/Gait assistance: Min guard;Min  assist Ambulation Distance (Feet): 16 Feet Assistive device: Rolling walker (2 wheeled) Gait Pattern/deviations: Step-through pattern;Decreased dorsiflexion - right;Decreased dorsiflexion - left;Shuffle;Wide base of support;Trunk flexed;Ataxic Gait velocity: reduced Gait velocity interpretation: Below normal speed for age/gender General Gait Details: pt can see only shadows, no clear vision.  He is in need of continual cues for direction and safety in his room, does not see the obstacles  Stairs            Wheelchair Mobility    Modified Rankin (Stroke Patients Only) Modified Rankin (Stroke Patients Only) Pre-Morbid Rankin Score: Slight disability Modified Rankin: Moderately severe disability     Balance Overall balance assessment: Needs assistance Sitting-balance support: Feet supported Sitting balance-Leahy Scale: Good     Standing balance support: Single extremity supported Standing balance-Leahy Scale: Fair                               Pertinent Vitals/Pain Pain Assessment: No/denies pain    Home Living Family/patient expects to be discharged to:: Private residence Living Arrangements: Alone Available Help at Discharge: Personal care attendant;Family;Available PRN/intermittently Type of Home: House Home Access: Stairs to enter Entrance Stairs-Rails: Right Entrance Stairs-Number of Steps: 3 Home Layout: Two level;Bed/bath upstairs;Able to live on main level with bedroom/bathroom;1/2 bath on main level Home Equipment: Shower seat;Walker - 4 wheels;Cane - single point      Prior Function Level of Independence: Needs assistance   Gait / Transfers Assistance Needed: pt endorses occasionally using a rollator for community  mobility or when he feels like he needs it, does prolonged walks (10x up/down driveway) and stationary bike exercises daily  ADL's / Homemaking Assistance Needed: pt reports independent, son present endorses pt gets assist for  cleaning and most meals, takes seated showers  Comments: frequent falls which pt passes off as a nuisance but not dangerous     Hand Dominance   Dominant Hand: Left    Extremity/Trunk Assessment                Communication   Communication: No difficulties  Cognition Arousal/Alertness: Awake/alert Behavior During Therapy: Impulsive Overall Cognitive Status: Impaired/Different from baseline Area of Impairment: Attention;Memory;Following commands;Safety/judgement;Awareness;Problem solving;Orientation                 Orientation Level: Place;Time;Situation Current Attention Level: Selective Memory: Decreased recall of precautions;Decreased short-term memory Following Commands: Follows one step commands inconsistently Safety/Judgement: Decreased awareness of safety;Decreased awareness of deficits Awareness: Intellectual Problem Solving: Slow processing;Decreased initiation;Difficulty sequencing;Requires verbal cues;Requires tactile cues General Comments: Pt is attempting to stand frequently and note his safety awareness is so impaired he might easily fall at home with many hours home alone      General Comments General comments (skin integrity, edema, etc.): Pt was not aware of his limitations with mobility, did try to get up several times before PT walked him due to impulse control    Exercises General Exercises - Lower Extremity Ankle Circles/Pumps: Other (comment) Other Exercises Other Exercises: talked with goals of therapy and rehab with pt's son   Assessment/Plan    PT Assessment Patient needs continued PT services  PT Problem List Decreased strength;Decreased range of motion;Decreased activity tolerance;Decreased balance;Decreased mobility;Decreased coordination;Decreased cognition;Decreased knowledge of use of DME;Decreased safety awareness;Decreased skin integrity       PT Treatment Interventions DME instruction;Gait training;Stair training;Functional  mobility training;Therapeutic activities;Therapeutic exercise;Balance training;Neuromuscular re-education;Patient/family education    PT Goals (Current goals can be found in the Care Plan section)  Acute Rehab PT Goals Patient Stated Goal: go home PT Goal Formulation: With family Time For Goal Achievement: 10/31/16 Potential to Achieve Goals: Good    Frequency Min 2X/week   Barriers to discharge Decreased caregiver support has 10 hours a week of assistance    Co-evaluation               AM-PAC PT "6 Clicks" Daily Activity  Outcome Measure Difficulty turning over in bed (including adjusting bedclothes, sheets and blankets)?: None Difficulty moving from lying on back to sitting on the side of the bed? : A Little Difficulty sitting down on and standing up from a chair with arms (e.g., wheelchair, bedside commode, etc,.)?: A Little Help needed moving to and from a bed to chair (including a wheelchair)?: A Little Help needed walking in hospital room?: A Little Help needed climbing 3-5 steps with a railing? : A Lot 6 Click Score: 18    End of Session Equipment Utilized During Treatment: Gait belt Activity Tolerance: Patient tolerated treatment well Patient left: with call bell/phone within reach;in chair;with chair alarm set;with family/visitor present Nurse Communication: Mobility status PT Visit Diagnosis: Unsteadiness on feet (R26.81);Muscle weakness (generalized) (M62.81);Difficulty in walking, not elsewhere classified (R26.2)    Time: 8469-6295 PT Time Calculation (min) (ACUTE ONLY): 34 min   Charges:   PT Evaluation $PT Eval Moderate Complexity: 1 Mod PT Treatments $Gait Training: 8-22 mins   PT G Codes:   PT G-Codes **NOT FOR INPATIENT CLASS** Functional Assessment Tool Used: AM-PAC 6 Clicks Basic Mobility  Ramond Dial 10/17/2016, 4:49 PM   Mee Hives, PT MS Acute Rehab Dept. Number: [place] and [place]

## 2016-10-17 NOTE — Consult Note (Signed)
Pt seen yesterday prior to d/c and had only mild L facial droop. One hour post d/c stated could not get out of car and was found to have L sided weakness. Currently has same L facial droop and another focal deficits.    Past Medical History:  Diagnosis Date  . AAA (abdominal aortic aneurysm) ([place])   . Arthritis   . Glaucoma   . Hypertension   . Stroke [facility])     Past Surgical History:  Procedure Laterality Date  . EYE SURGERY    . neck fusion    . TUMOR REMOVAL      Family History  Problem Relation Age of Onset  . Stroke Mother   . Stroke Father   . Cancer Brother     Social History:  reports that he has quit smoking. He has never used smokeless tobacco. He reports that he drinks alcohol. He reports that he does not use drugs.  Allergies  Allergen Reactions  . Shellfish-Derived Products Nausea Only    Scallops    Medications: I have reviewed the patient's current medications.  ROS: History obtained from the patient  General ROS: negative for - chills, fatigue, fever, night sweats, weight gain or weight loss Psychological ROS: negative for - behavioral disorder, hallucinations, memory difficulties, mood swings or suicidal ideation Ophthalmic ROS: negative for - blurry vision, double vision, eye pain or loss of vision ENT ROS: negative for - epistaxis, nasal discharge, oral lesions, sore throat, tinnitus or vertigo Allergy and Immunology ROS: negative for - hives or itchy/watery eyes Hematological and Lymphatic ROS: negative for - bleeding problems, bruising or swollen lymph nodes Endocrine ROS: negative for - galactorrhea, hair pattern changes, polydipsia/polyuria or temperature intolerance Respiratory ROS: negative for - cough, hemoptysis, shortness of breath or wheezing Cardiovascular ROS: negative for - chest pain, dyspnea on exertion, edema or irregular heartbeat Gastrointestinal ROS: negative for - abdominal pain, diarrhea, hematemesis, nausea/vomiting or stool  incontinence Genito-Urinary ROS: negative for - dysuria, hematuria, incontinence or urinary frequency/urgency Musculoskeletal ROS: negative for - joint swelling or muscular weakness Neurological ROS: as noted in HPI Dermatological ROS: negative for rash and skin lesion changes  Physical Examination: Blood pressure (!) 161/87, pulse (!) 58, temperature 98 F (36.7 C), temperature source Oral, resp. rate 18, SpO2 96 %.   Neurological Examination   Mental Status: Alert, oriented, thought content appropriate.  Speech fluent without evidence of aphasia.  Able to follow 3 step commands without difficulty. Cranial Nerves: II: Discs flat bilaterally; Visual fields grossly normal, pupils equal, round, reactive to light and accommodation III,IV, VI: ptosis not present, extra-ocular motions intact bilaterally V,VII: smile L facial droop VIII: hearing normal bilaterally IX,X: gag reflex present XI: bilateral shoulder shrug XII: midline tongue extension Motor: Right : Upper extremity   5/5    Left:     Upper extremity   5/5  Lower extremity   5/5     Lower extremity   5/5 Tone and bulk:normal tone throughout; no atrophy noted Sensory: Pinprick and light touch intact throughout, bilaterally Deep Tendon Reflexes: 1+ and symmetric throughout Plantars: Right: downgoing   Left: downgoing Cerebellar: normal finger-to-nose, normal rapid alternating movements and normal heel-to-shin test Gait: not tested       Laboratory Studies:   Basic Metabolic Panel:  Recent Labs Lab 10/15/16 0919 10/16/16 1356  NA 144 141  K 4.1 4.3  CL 110 108  CO2 27 26  GLUCOSE 106* 122*  BUN 20 17  CREATININE 1.16 1.39*  CALCIUM 9.4 9.5    Liver Function Tests:  Recent Labs Lab 10/15/16 0919 10/16/16 1356  AST 24 23  ALT 13* 13*  ALKPHOS 54 56  BILITOT 0.9 1.1  PROT 6.6 6.8  ALBUMIN 3.8 3.8   No results for input(s): LIPASE, AMYLASE in the last 168 hours. No results for input(s): AMMONIA in the  last 168 hours.  CBC:  Recent Labs Lab 10/15/16 0919 10/16/16 1356  WBC 9.1 9.2  NEUTROABS 5.7 6.5  HGB 14.5 15.4  HCT 44.5 46.6  MCV 87.7 86.8  PLT 231 234    Cardiac Enzymes:  Recent Labs Lab 10/15/16 0919 10/16/16 1356  TROPONINI <0.03 <0.03    BNP: Invalid input(s): POCBNP  CBG:  Recent Labs Lab 10/15/16 0921 10/16/16 1420  GLUCAP 6 117*    Microbiology: Results for orders placed or performed in visit on 02/27/12  Influenza A&B Antigens [facility])     Status: None   Collection Time: 02/27/12  5:07 AM  Result Value Ref Range Status   Micro Text Report   Final       COMMENT                   NEGATIVE FOR INFLUENZA A (ANTIGEN ABSENT)   COMMENT                   NEGATIVE FOR INFLUENZA B (ANTIGEN ABSENT)   ANTIBIOTIC                                                        Coagulation Studies:  Recent Labs  10/15/16 0919 10/16/16 1356  LABPROT 13.6 12.2  INR 1.04 0.91    Urinalysis: No results for input(s): COLORURINE, LABSPEC, PHURINE, GLUCOSEU, HGBUR, BILIRUBINUR, KETONESUR, PROTEINUR, UROBILINOGEN, NITRITE, LEUKOCYTESUR in the last 168 hours.  Invalid input(s): APPERANCEUR  Lipid Panel:     Component Value Date/Time   CHOL 179 10/17/2016 0543   TRIG 161 (H) 10/17/2016 0543   HDL 42 10/17/2016 0543   CHOLHDL 4.3 10/17/2016 0543   VLDL 32 10/17/2016 0543   LDLCALC 105 (H) 10/17/2016 0543    HgbA1C: No results found for: HGBA1C  Urine Drug Screen:  No results found for: LABOPIA, COCAINSCRNUR, LABBENZ, AMPHETMU, THCU, LABBARB  Alcohol Level: No results for input(s): ETH in the last 168 hours.   Imaging: Mr Brain Wo Contrast  Result Date: 10/16/2016 CLINICAL DATA:  Initial evaluation for acute stroke. EXAM: MRI HEAD WITHOUT CONTRAST TECHNIQUE: Multiplanar, multiecho pulse sequences of the brain and surrounding structures were obtained without intravenous contrast. COMPARISON:  Prior CT from earlier the same day. FINDINGS: Brain: Diffuse  prominence of the CSF containing spaces is compatible with generalized cerebral atrophy. Advanced chronic microvascular ischemic disease again noted. Remote lacunar infarcts within the left thalamus and pons. Scattered bilateral remote cerebellar infarcts, with additional remote right parietal cortical infarct. Changes are stable from previous. There is subtle diffusion abnormality involving the right paramedian ventral pons (series 100, image 16). Subtle signal loss seen on corresponding ADC map (series 4, image 16). Finding consistent with acute ischemic infarct. Area of involvement measures approximately 13 x 14 mm. No associated hemorrhage or mass effect. No other evidence for acute ischemia. Chronic micro hemorrhages within the left cerebellum and pons noted. No mass lesion or midline shift. No  mass effect. No hydrocephalus. No extra-axial fluid collection. Major dural sinuses are grossly patent. Pituitary gland suprasellar region within normal limits. Vascular: Major intracranial vascular flow voids are maintained. Skull and upper cervical spine: Craniocervical junction within normal limits. Postsurgical changes noted within the upper cervical spine. Bone marrow signal intensity within normal limits. Scalp soft tissues unremarkable. Sinuses/Orbits: Globes and orbital soft tissues within normal limits. Patient status post lens extraction bilaterally. Extensive paranasal sinus disease again noted, stable. No significant mastoid effusion. Inner ear structures normal. IMPRESSION: 1. 14 mm acute ischemic nonhemorrhagic infarct involving the right paramedian ventral pons. No associated mass effect. 2. Otherwise stable atrophy with severe chronic ischemic disease as above. Electronically Signed   By: Jeannine Boga M.D.   On: 10/16/2016 16:51   Mr Jeri Cos HA Contrast  Result Date: 10/15/2016 CLINICAL DATA:  83 year old male with new onset bilateral lower extremity weakness, left facial droop and weakness.  EXAM: MRI HEAD WITHOUT AND WITH CONTRAST TECHNIQUE: Multiplanar, multiecho pulse sequences of the brain and surrounding structures were obtained without and with intravenous contrast. CONTRAST:  59mL MULTIHANCE GADOBENATE DIMEGLUMINE 529 MG/ML IV SOLN COMPARISON:  CTA head and neck 1056 hours today. Brain MRI 07/03/2009. FINDINGS: Brain: No restricted diffusion or evidence of acute infarction. Patchy chronic encephalomalacia in the right parietal lobe re- identified along with numerous chronic patchy in lacunar infarcts in both cerebellar hemispheres, the pons (progressed on the left since 2011), bilateral thalami and basal ganglia. Underlying extensive T2 heterogeneity in the deep gray matter nuclei compatible with a degree of dilated perivascular spaces. Associated widespread patchy cerebral white matter T2 and FLAIR hyperintensity. Small chronic microhemorrhage in the ventral pons. No abnormal enhancement or dural thickening. No restricted diffusion to suggest acute infarction. No midline shift, mass effect, evidence of mass lesion, ventriculomegaly, extra-axial collection or acute intracranial hemorrhage. Cervicomedullary junction and pituitary are within normal limits. Vascular: Major intracranial vascular flow voids are stable compared to 2011. Skull and upper cervical spine: Previous cervical ACDF. Normal bone marrow signal. Sinuses/Orbits: Stable and negative orbits soft tissues. Progressed chronic paranasal sinus disease since 2011, with inspissated material now throughout the left sphenoid sinus. Other: Mastoids are clear. Visible internal auditory structures appear normal. Negative scalp and orbits soft tissues. IMPRESSION: Severe chronic ischemic disease but no acute infarct or acute intracranial abnormality identified. Electronically Signed   By: Genevie Ann M.D.   On: 10/15/2016 16:15   Ct Head Code Stroke Wo Contrast  Result Date: 10/16/2016 CLINICAL DATA:  Code stroke. Generalized bilateral weakness.  Slurred speech. EXAM: CT HEAD WITHOUT CONTRAST TECHNIQUE: Contiguous axial images were obtained from the base of the skull through the vertex without intravenous contrast. COMPARISON:  Brain MRI 10/15/2016 FINDINGS: Brain: No mass lesion or acute hemorrhage. No focal hypoattenuation of the basal ganglia or cortex to indicate infarcted tissue. There is generalized mild for age atrophy. There is periventricular hypoattenuation compatible with chronic microvascular disease. Old left pontine infarct and bilateral old cerebellar infarcts. Vascular: No hyperdense vessel. No advanced atherosclerotic calcification of the arteries at the skull base. Skull: Normal visualized skull base, calvarium and extracranial soft tissues. Sinuses/Orbits: Near complete opacification of the visualized left ethmoid, sphenoid and maxillary sinuses. Normal orbits. ASPECTS [facility] Stroke Program Early CT Score) - Ganglionic level infarction (caudate, lentiform nuclei, internal capsule, insula, M1-M3 cortex): 7 - Supraganglionic infarction (M4-M6 cortex): 3 Total score (0-10 with 10 being normal): 10 IMPRESSION: 1. No acute hemorrhage or mass lesion. 2. Severe chronic microvascular ischemia. 3. ASPECTS  is 10. These results were called by telephone at the time of interpretation on 10/16/2016 at 1:57 pm to Dr. Delman Kitten , who verbally acknowledged these results. Electronically Signed   By: Ulyses Jarred M.D.   On: 10/16/2016 14:02     Assessment/Plan: Pt seen yesterday prior to d/c and had only mild L facial droop. One hour post d/c stated could not get out of car and was found to have L sided weakness. Currently has same L facial droop and another focal deficits.   Was not TPA candidate as symptoms still persistent with L facial droop. Pt has R pontine stroke.  On ASA and plavix at this point.  Stroke is likely from small vessel dz and pontine perforators coming from posterior circulation which is open Would watch for one more day in  hospital to make sure no periods of hypotension as well as pt/ot  Leotis Pain     10/17/2016, 12:15 PM

## 2016-10-17 NOTE — Progress Notes (Signed)
SLP Cancellation Note  Patient Details Name: Steve Bryant MRN: [id number] DOB: Jun 30, 1925   Cancelled treatment:       Reason Eval/Treat Not Completed: SLP screened, no needs identified, will sign off (chart reviewed; consulted NSG then met w/ pt and family)  Pt denied any difficulty swallowing and is currently on a regular diet; eating lunch meal while in room w/ him. He tolerates swallowing pills w/ water per NSG. Pt conversed at conversational level w/out new speech deficits noted. Pt and family denied any new speech-language deficits since this readmission. Pt continues to exhibit min decreased tone in the Left corner of his mouth but this did not impact his speech or fluency significantly at all - no change reported by pt or family w/ this readmission from yesterday's eval. Recommended pt continue w/ the same OMEs and speech-articulation strategies as practiced and hand outs given on yesterday.   Pt denied need for further skilled ST services as pt feels he is "fine"; he appears at his baseline. Pt and family agreed to f/u w/ primary MD for referral to [place] services if pt feels indicated post d/c home. NSG to reconsult if any change in status.    Orinda Kenner, MS, CCC-SLP Watson,Katherine 10/17/2016, 1:50 PM

## 2016-10-17 NOTE — Evaluation (Signed)
Occupational Therapy Evaluation Patient Details Name: Steve Bryant MRN: [id number] DOB: 1925-07-31 Today's Date: 10/17/2016    History of Present Illness 83 year old male pt with PMHx significant for history of CVA with no residual neurological deficits, hypertension, glaucoma, and legal blindness who presented to hospital on 8/9 with BLE weakness and L facial droop and dysarthria. Patient admitted for TIA, MRI was negative and was discharged 8/10 home. Readmitted on 8/10 with left-sided weakness and noted to have acute right pontine infarct by MRI.    Clinical Impression   Pt seen for OT evaluation this date. Son and daughter in law present in room throughout session. Pt with previous admission day before, presenting now with same L facial droop, generalized weakness even bilaterally (aside from existing R hand deficits as noted below), normal rapid alternating movement testing, negative pronator drift, intact sensation, and significant baseline low vision deficits (0% vision in L eye, 11% central vision in R eye due to glaucoma). Pt was not using his rollator at home consistently, endorses 10 falls in past 12 months, gets assist from family for errands, PCA assists with cooking, cleaning, and pt taking seated showers. Pt lives on 2nd floor of home with walk in shower with access to bedroom on main floor but only a half bath. Based on current noted impairments and resulting functional deficits, pt will benefit from skilled OT services to maximize functional independence and safety while minimizing risk of future falls/injury/rehospitalization. Baseline generalized weakness and impaired balance exacerbated by recent/current medical events as well as baseline significant low vision deficits results in pt requiring min guard for all functional transfers and mobility OOB using RW with verbal cues required for safe use. Pt with decreased safety awareness and insight into balance deficits and falls risk.  Based on this and larger clinical picture and contextual factors, recommend transition to STR prior to return home pending pt's progress during hospital stay in order to maximize safety/minimize falls risk and maximize return to PLOF.     Follow Up Recommendations  SNF    Equipment Recommendations  3 in 1 bedside commode    Recommendations for Other Services       Precautions / Restrictions Precautions Precautions: Fall Restrictions Weight Bearing Restrictions: No      Mobility Bed Mobility Overal bed mobility: Needs Assistance Bed Mobility: Supine to Sit;Sit to Supine     Supine to sit: Supervision;HOB elevated Sit to supine: Min guard   General bed mobility comments: additional time/effort with use of bed rails to sit EOB, min guard for BLE mgt during sit>supine; no dizziness noted  Transfers Overall transfer level: Needs assistance Equipment used: Rolling walker (2 wheeled) Transfers: Sit to/from Stand Sit to Stand: Min guard         General transfer comment: min guard during several trials of sit<>stand transfers EOB, max VC for hand placement to maximize safety, as pt wants to pull up on walker and does not reach back for seat prior to sitting resulting in "plopping" down, increasing his falls risk    Balance Overall balance assessment: Needs assistance Sitting-balance support: No upper extremity supported;Feet supported Sitting balance-Leahy Scale: Good     Standing balance support: Bilateral upper extremity supported Standing balance-Leahy Scale: Fair                             ADL either performed or assessed with clinical judgement   ADL Overall ADL's : Needs assistance/impaired Eating/Feeding:  Sitting;Set up;Minimal assistance Eating/Feeding Details (indicate cue type and reason): occasional min assist due to low vision, requires good set up, bright lighting, cues for placement of items on tray Grooming: Sitting;Set up;Supervision/safety    Upper Body Bathing: Min guard;Set up;Sitting   Lower Body Bathing: Minimal assistance;Sit to/from stand;Sitting/lateral leans;Set up   Upper Body Dressing : Sitting;Set up;Supervision/safety   Lower Body Dressing: Minimal assistance;Sit to/from stand;Sitting/lateral leans   Toilet Transfer: Min guard;Stand-pivot;BSC;Cueing for sequencing;Cueing for Office manager Details (indicate cue type and reason): VC for hand placement           General ADL Comments: pt generally supervision to min assist level for LB ADL for safety     Vision Baseline Vision/History: Glaucoma;Wears glasses (significant low vision impairments, completely blind in L eye, 11% central vision in R eye, no peripheral) Wears Glasses: At all times Patient Visual Report: Other (comment);Peripheral vision impairment (pt reports baseline low vision has been getting progressively worse recently) Vision Assessment?: Vision impaired- to be further tested in functional context     Perception     Praxis      Pertinent Vitals/Pain Pain Assessment: No/denies pain     Hand Dominance Left (R handed but has had to switch since R hand nerve damage limiting his functional use)   Extremity/Trunk Assessment Upper Extremity Assessment Upper Extremity Assessment: Generalized weakness (generally weak bilaterally, 4/5, R grip/pinch/functional use limited due to chronic R hand nerve damage/weakness, intact sensation, normal rapid alternating movement testing, unable to test finger to nose due to low vision)   Lower Extremity Assessment Lower Extremity Assessment: Generalized weakness (generally weak bilaterally, intact sensation)   Cervical / Trunk Assessment Cervical / Trunk Assessment: Normal   Communication Communication Communication: No difficulties   Cognition Arousal/Alertness: Awake/alert Behavior During Therapy: WFL for tasks assessed/performed Overall Cognitive Status: Within Functional Limits for tasks  assessed                                     General Comments       Exercises Other Exercises Other Exercises: pt/family members educated in falls prevention strategies, functional transfer training, low vision strategies for self feeding/meal set up   Shoulder Instructions      Home Living Family/patient expects to be discharged to:: Private residence Living Arrangements: Alone Available Help at Discharge: Personal care attendant;Family;Available PRN/intermittently Type of Home: House Home Access: Stairs to enter [employer] of Steps: 3 Entrance Stairs-Rails: Right Home Layout: Two level;Bed/bath upstairs;Able to live on main level with bedroom/bathroom;1/2 bath on main level Alternate Level Stairs-Number of Steps: full flight Alternate Level Stairs-Rails: Can reach both Bathroom Shower/Tub: Occupational psychologist: Standard     Home Equipment: Clinical cytogeneticist - 4 wheels;Cane - single point          Prior Functioning/Environment Level of Independence: Needs assistance  Gait / Transfers Assistance Needed: pt endorses occasionally using a rollator for community mobility or when he feels like he needs it, does prolonged walks (10x up/down driveway) and stationary bike exercises daily ADL's / Homemaking Assistance Needed: pt reports independent, son present endorses pt gets assist for cleaning and most meals, takes seated showers   Comments: Pt endorses "falling right much", approx 10 falls in past 12 months due to his LLE giving out on him        OT Problem List: Decreased strength;Decreased activity tolerance;Decreased safety awareness;Impaired balance (sitting  and/or standing);Decreased knowledge of use of DME or AE;Impaired vision/perception;Impaired UE functional use      OT Treatment/Interventions: Self-care/ADL training;Therapeutic exercise;Therapeutic activities;Energy conservation;DME and/or AE instruction;Patient/family  education;Visual/perceptual remediation/compensation    OT Goals(Current goals can be found in the care plan section) Acute Rehab OT Goals Patient Stated Goal: go home OT Goal Formulation: With patient/family Time For Goal Achievement: 10/31/16 Potential to Achieve Goals: Good  OT Frequency: Min 1X/week   Barriers to D/C: Inaccessible home environment;Decreased caregiver support          Co-evaluation              AM-PAC PT "6 Clicks" Daily Activity     Outcome Measure Help from another person eating meals?: A Little Help from another person taking care of personal grooming?: A Little Help from another person toileting, which includes using toliet, bedpan, or urinal?: A Little Help from another person bathing (including washing, rinsing, drying)?: A Little Help from another person to put on and taking off regular upper body clothing?: A Little Help from another person to put on and taking off regular lower body clothing?: A Little 6 Click Score: 18   End of Session Equipment Utilized During Treatment: Gait belt;Rolling walker  Activity Tolerance: Patient tolerated treatment well Patient left: in bed;with call bell/phone within reach;with bed alarm set;with family/visitor present;Other (comment) (neurologist in room at end of session)  OT Visit Diagnosis: Other abnormalities of gait and mobility (R26.89);Repeated falls (R29.6);Muscle weakness (generalized) (M62.81);Other symptoms and signs involving cognitive function                Time: 1127-1205 OT Time Calculation (min): 38 min Charges:  OT General Charges $OT Visit: 1 Procedure OT Evaluation $OT Eval Low Complexity: 1 Procedure OT Treatments $Self Care/Home Management : 8-22 mins G-Codes: OT G-codes **NOT FOR INPATIENT CLASS** Functional Assessment Tool Used: AM-PAC 6 Clicks Daily Activity;Clinical judgement Functional Limitation: Self care Self Care Current Status (R9758): At least 40 percent but less than 60  percent impaired, limited or restricted Self Care Goal Status (I3254): At least 20 percent but less than 40 percent impaired, limited or restricted   Jeni Salles, MPH, MS, OTR/L ascom [phone] 10/17/16, 12:44 PM

## 2016-10-17 NOTE — Progress Notes (Signed)
*  PRELIMINARY RESULTS* Echocardiogram 2D Echocardiogram has been performed.  Steve Bryant Steve Bryant 10/17/2016, 10:17 AM

## 2016-10-17 NOTE — Clinical Social Work Note (Signed)
CSW met with the patient and his son-in-law at bedside to discuss discharge planning. The patient requested to have the evening to discuss PT/OT recommendations for SNF with his family. The patient did not give permission for a referral for SNF as of yet; therefore, the CSW will not process such until permission has been given. CSW will discuss the final disposition of the patient and family in the morning at the patient's request. Currently, he reports that he would rather go home.  Santiago Bumpers, MSW, Latanya Presser [phone]

## 2016-10-17 NOTE — Progress Notes (Signed)
[facility] [facility] [place] NAME: Steve Bryant    MR#:  [id number]  DATE OF BIRTH:  04/18/1925  SUBJECTIVE:  CHIEF COMPLAINT:   Chief Complaint  Patient presents with  . Code Stroke   - Patient admitted for TIA, MRI was negative and was just discharged yesterday. Readmitted again with left-sided weakness and noted to have acute right pontine infarct. -No further change since yesterday. He did have the left facial droop yesterday prior to discharge.  REVIEW OF SYSTEMS:  Review of Systems  Constitutional: Negative for chills, fever and malaise/fatigue.  HENT: Negative for congestion, ear discharge, hearing loss and nosebleeds.   Eyes: Positive for blurred vision.  Respiratory: Negative for cough, shortness of breath and wheezing.   Cardiovascular: Negative for chest pain, palpitations and leg swelling.  Gastrointestinal: Negative for abdominal pain, constipation, diarrhea, nausea and vomiting.  Genitourinary: Negative for dysuria.  Musculoskeletal: Negative for myalgias.  Neurological: Positive for focal weakness and weakness. Negative for dizziness, sensory change, speech change, seizures and headaches.  Psychiatric/Behavioral: Negative for depression.    DRUG ALLERGIES:   Allergies  Allergen Reactions  . Shellfish-Derived Products Nausea Only    Scallops    VITALS:  Blood pressure (!) 161/87, pulse (!) 58, temperature 98 F (36.7 C), temperature source Oral, resp. rate 18, SpO2 96 %.  PHYSICAL EXAMINATION:  Physical Exam  GENERAL:  83 y.o.-year-old elderly patient sitting in the chair with no acute distress.  EYES: Pupils equal, round, sluggishly reactive to light and accommodation. No scleral icterus. Extraocular muscles intact.  HEENT: Head atraumatic, normocephalic. Oropharynx and nasopharynx clear. Minimal left facial droop noted NECK:  Supple, no jugular venous distention. No thyroid enlargement, no tenderness.  LUNGS:  Normal breath sounds bilaterally, no wheezing, rales,rhonchi or crepitation. No use of accessory muscles of respiration. Decreased bibasilar breath sounds. CARDIOVASCULAR: S1, S2 normal. No rubs, or gallops. 3/6 systolic murmur present ABDOMEN: Soft, non-tender, non-distended. Bowel sounds present. No organomegaly or mass.  EXTREMITIES: No pedal edema, cyanosis, or clubbing.  NEUROLOGIC: Cranial nerves II through XII are intact except decreased visual acuity with bilateral tunnel vision, left better than the right.. Muscle strength 5/5 in all extremities. Sensation intact. Gait not checked.  PSYCHIATRIC: The patient is alert and oriented x 3.  SKIN: No obvious rash, lesion, or ulcer. [name]    LABORATORY PANEL:   CBC  Recent Labs Lab 10/16/16 1356  WBC 9.2  HGB 15.4  HCT 46.6  PLT 234   ------------------------------------------------------------------------------------------------------------------  Chemistries   Recent Labs Lab 10/16/16 1356  NA 141  K 4.3  CL 108  CO2 26  GLUCOSE 122*  BUN 17  CREATININE 1.39*  CALCIUM 9.5  AST 23  ALT 13*  ALKPHOS 56  BILITOT 1.1   ------------------------------------------------------------------------------------------------------------------  Cardiac Enzymes  Recent Labs Lab 10/16/16 1356  TROPONINI <0.03   ------------------------------------------------------------------------------------------------------------------  RADIOLOGY:  Mr Brain Wo Contrast  Result Date: 10/16/2016 CLINICAL DATA:  Initial evaluation for acute stroke. EXAM: MRI HEAD WITHOUT CONTRAST TECHNIQUE: Multiplanar, multiecho pulse sequences of the brain and surrounding structures were obtained without intravenous contrast. COMPARISON:  Prior CT from earlier the same day. FINDINGS: Brain: Diffuse prominence of the CSF containing spaces is compatible with generalized cerebral atrophy. Advanced chronic microvascular ischemic disease again noted. Remote lacunar  infarcts within the left thalamus and pons. Scattered bilateral remote cerebellar infarcts, with additional remote right parietal cortical infarct. Changes are stable from previous. There is subtle diffusion abnormality involving  the right paramedian ventral pons (series 100, image 16). Subtle signal loss seen on corresponding ADC map (series 4, image 16). Finding consistent with acute ischemic infarct. Area of involvement measures approximately 13 x 14 mm. No associated hemorrhage or mass effect. No other evidence for acute ischemia. Chronic micro hemorrhages within the left cerebellum and pons noted. No mass lesion or midline shift. No mass effect. No hydrocephalus. No extra-axial fluid collection. Major dural sinuses are grossly patent. Pituitary gland suprasellar region within normal limits. Vascular: Major intracranial vascular flow voids are maintained. Skull and upper cervical spine: Craniocervical junction within normal limits. Postsurgical changes noted within the upper cervical spine. Bone marrow signal intensity within normal limits. Scalp soft tissues unremarkable. Sinuses/Orbits: Globes and orbital soft tissues within normal limits. Patient status post lens extraction bilaterally. Extensive paranasal sinus disease again noted, stable. No significant mastoid effusion. Inner ear structures normal. IMPRESSION: 1. 14 mm acute ischemic nonhemorrhagic infarct involving the right paramedian ventral pons. No associated mass effect. 2. Otherwise stable atrophy with severe chronic ischemic disease as above. Electronically Signed   By: Jeannine Boga M.D.   On: 10/16/2016 16:51   Mr Jeri Cos XF Contrast  Result Date: 10/15/2016 CLINICAL DATA:  83 year old male with new onset bilateral lower extremity weakness, left facial droop and weakness. EXAM: MRI HEAD WITHOUT AND WITH CONTRAST TECHNIQUE: Multiplanar, multiecho pulse sequences of the brain and surrounding structures were obtained without and with  intravenous contrast. CONTRAST:  26mL MULTIHANCE GADOBENATE DIMEGLUMINE 529 MG/ML IV SOLN COMPARISON:  CTA head and neck 1056 hours today. Brain MRI 07/03/2009. FINDINGS: Brain: No restricted diffusion or evidence of acute infarction. Patchy chronic encephalomalacia in the right parietal lobe re- identified along with numerous chronic patchy in lacunar infarcts in both cerebellar hemispheres, the pons (progressed on the left since 2011), bilateral thalami and basal ganglia. Underlying extensive T2 heterogeneity in the deep gray matter nuclei compatible with a degree of dilated perivascular spaces. Associated widespread patchy cerebral white matter T2 and FLAIR hyperintensity. Small chronic microhemorrhage in the ventral pons. No abnormal enhancement or dural thickening. No restricted diffusion to suggest acute infarction. No midline shift, mass effect, evidence of mass lesion, ventriculomegaly, extra-axial collection or acute intracranial hemorrhage. Cervicomedullary junction and pituitary are within normal limits. Vascular: Major intracranial vascular flow voids are stable compared to 2011. Skull and upper cervical spine: Previous cervical ACDF. Normal bone marrow signal. Sinuses/Orbits: Stable and negative orbits soft tissues. Progressed chronic paranasal sinus disease since 2011, with inspissated material now throughout the left sphenoid sinus. Other: Mastoids are clear. Visible internal auditory structures appear normal. Negative scalp and orbits soft tissues. IMPRESSION: Severe chronic ischemic disease but no acute infarct or acute intracranial abnormality identified. Electronically Signed   By: Genevie Ann M.D.   On: 10/15/2016 16:15   Ct Head Code Stroke Wo Contrast  Result Date: 10/16/2016 CLINICAL DATA:  Code stroke. Generalized bilateral weakness. Slurred speech. EXAM: CT HEAD WITHOUT CONTRAST TECHNIQUE: Contiguous axial images were obtained from the base of the skull through the vertex without intravenous  contrast. COMPARISON:  Brain MRI 10/15/2016 FINDINGS: Brain: No mass lesion or acute hemorrhage. No focal hypoattenuation of the basal ganglia or cortex to indicate infarcted tissue. There is generalized mild for age atrophy. There is periventricular hypoattenuation compatible with chronic microvascular disease. Old left pontine infarct and bilateral old cerebellar infarcts. Vascular: No hyperdense vessel. No advanced atherosclerotic calcification of the arteries at the skull base. Skull: Normal visualized skull base, calvarium and extracranial soft  tissues. Sinuses/Orbits: Near complete opacification of the visualized left ethmoid, sphenoid and maxillary sinuses. Normal orbits. ASPECTS [facility] Stroke Program Early CT Score) - Ganglionic level infarction (caudate, lentiform nuclei, internal capsule, insula, M1-M3 cortex): 7 - Supraganglionic infarction (M4-M6 cortex): 3 Total score (0-10 with 10 being normal): 10 IMPRESSION: 1. No acute hemorrhage or mass lesion. 2. Severe chronic microvascular ischemia. 3. ASPECTS is 10. These results were called by telephone at the time of interpretation on 10/16/2016 at 1:57 pm to Dr. Delman Kitten , who verbally acknowledged these results. Electronically Signed   By: Ulyses Jarred M.D.   On: 10/16/2016 14:02    EKG:   Orders placed or performed during the hospital encounter of 10/16/16  . ED EKG  . ED EKG    ASSESSMENT AND PLAN:   83 year old gentleman with past medical history significant for history of CVA with no residual neurological deficits, hypertension, glaucoma, legal blindness presents to hospital secondary to generalized weakness associated with dysarthria and left facial droop.  #1 acute CVA-admitted yesterday for possible TIA and MRI was negative, however did have some left facial droop at discharge. Came back again with left arm weakness and noted to have right pontine infarct on MRI repeat. -CT angiogram of the head and neck were done last admission  yesterday. It was revealing prior right carotid endarterectomy but left-sided stenosis of about 60% with significant atherosclerotic disease. -Patient was on aspirin prior to this past admission and was started on Plavix which we will continue. Neurology consulted. -On low-dose statin has also been started for LDL greater than 105. -Physical therapy and occupational therapy will be consulted again.  #2 hypertension- on low dose losartan and Norvasc.  #3 glaucoma-continue his eyedrops  #4 GERD-PPI  #5 DVT prophylaxis-on Lovenox   All the records are reviewed and case discussed with Care Management/Social Workerr. Management plans discussed with the patient, family and they are in agreement.  CODE STATUS: Full code  TOTAL TIME TAKING CARE OF THIS PATIENT: 29 minutes.   POSSIBLE D/C IN 1-2 DAYS, DEPENDING ON CLINICAL CONDITION.   Gladstone Lighter M.D on 10/17/2016 at 11:20 AM  Between 7am to 6pm - Pager - [phone]  After 6pm go to [email or web address] - password [email or web address] [facility]  Office  [phone]  CC: Primary care physician; [name], MD

## 2016-10-17 NOTE — Progress Notes (Signed)
OT Cancellation Note  Patient Details Name: LIONEL WOODBERRY MRN: [id number] DOB: [birth year]   Cancelled Treatment:    Reason Eval/Treat Not Completed: Patient at procedure or test/ unavailable. Order received, chart reviewed. Pt with MD on initial attempt, will re-attempt OT evaluation later this morning as pt is available.  Jeni Salles, MPH, MS, OTR/L ascom [phone] 10/17/16, 10:28 AM

## 2016-10-18 LAB — BASIC METABOLIC PANEL
Anion gap: 8 (ref 5–15)
BUN: 14 mg/dL (ref 6–20)
CO2: 25 mmol/L (ref 22–32)
Calcium: 8.9 mg/dL (ref 8.9–10.3)
Chloride: 108 mmol/L (ref 101–111)
Creatinine, Ser: 1.1 mg/dL (ref 0.61–1.24)
GFR calc Af Amer: >60 mL/min (ref >60)
GFR calc non Af Amer: 58 mL/min — ABNORMAL LOW (ref >60)
Glucose, Bld: 87 mg/dL (ref 65–99)
Potassium: 3.7 mmol/L (ref 3.5–5.1)
Sodium: 141 mmol/L (ref 135–145)

## 2016-10-18 MED ORDER — AMLODIPINE BESYLATE 5 MG PO TABS: 5.0000 mg | ORAL_TABLET | Freq: Every day | ORAL | 2 refills | Status: DC

## 2016-10-18 NOTE — Progress Notes (Signed)
10/18/16 2200  PT Visit Information  Last PT Received On 10/18/16  Assistance Needed +1  History of Present Illness 83 year old male pt with PMHx significant for history of CVA with no residual neurological deficits, hypertension, glaucoma, and legal blindness who presented to hospital on 8/9 with BLE weakness and L facial droop and dysarthria. Patient admitted for TIA, MRI was negative and was discharged 8/10 home. Readmitted on 8/10 with left-sided weakness and noted to have acute right pontine infarct by MRI.   Subjective Data  Subjective reports he did not reallize how unsteady he is  Patient Stated Goal go home  Precautions  Precautions Fall  Restrictions  Weight Bearing Restrictions No  Pain Assessment  Pain Assessment No/denies pain  Cognition  Arousal/Alertness Awake/alert  Behavior During Therapy Impulsive  Overall Cognitive Status Within Functional Limits for tasks assessed  Bed Mobility  Overal bed mobility Needs Assistance  Bed Mobility Supine to Sit;Sit to Supine  Supine to sit Min guard;Min assist  Sit to supine Min guard  General bed mobility comments uses bed rail and needs assistance to pull up his trunk and finish scooting out to side of bed  Transfers  Overall transfer level Needs assistance  Equipment used Rolling walker (2 wheeled);1 person hand held assist  Transfers Sit to/from Stand  Sit to Stand Min guard;Min assist  General transfer comment reminding to place hands on bed to sit and to use rail to stand  Ambulation/Gait  Ambulation/Gait assistance Min assist  Ambulation Distance (Feet) 80 Feet  Assistive device Rolling walker (2 wheeled)  Gait Pattern/deviations Step-through pattern;Decreased stride length;Shuffle;Narrow base of support;Trunk flexed;Staggering right  General Gait Details has limited vision due to glaucoma but sees shadows, and is losing conrol of balance to the right  Gait velocity reduced  Gait velocity interpretation Below normal  speed for age/gender  Modified Rankin (Stroke Patients Only)  Pre-Morbid Rankin Score 2  Modified Rankin 4  Balance  Overall balance assessment Needs assistance  Sitting-balance support Feet supported  Sitting balance-Leahy Scale Good  Standing balance support Bilateral upper extremity supported  Standing balance-Leahy Scale Fair  Standing balance comment depending on RW for steadying esp to walk  General Comments  General comments (skin integrity, edema, etc.) pt was able to see his balance changes with longer gait trip today  Exercises  Exercises General Lower Extremity  General Exercises - Lower Extremity  Ankle Circles/Pumps AROM;Both;5 reps  Quad Sets AROM;Both;10 reps  Gluteal Sets AROM;Both;10 reps  Heel Slides AROM;Both;10 reps  Hip ABduction/ADduction AROM;Both;10 reps  Straight Leg Raises AROM;Both;10 reps  PT - Assessment/Plan  PT Visit Diagnosis Unsteadiness on feet (R26.81);Muscle weakness (generalized) (M62.81);Difficulty in walking, not elsewhere classified (R26.2)  PT Frequency (ACUTE ONLY) Min 2X/week  Follow Up Recommendations SNF  PT equipment None recommended by PT  AM-PAC PT "6 Clicks" Daily Activity Outcome Measure  Difficulty turning over in bed (including adjusting bedclothes, sheets and blankets)? 4  Difficulty moving from lying on back to sitting on the side of the bed?  3  Difficulty sitting down on and standing up from a chair with arms (e.g., wheelchair, bedside commode, etc,.)? 3  Help needed moving to and from a bed to chair (including a wheelchair)? 3  Help needed walking in hospital room? 3  Help needed climbing 3-5 steps with a railing?  2  6 Click Score 18  Mobility G Code  CK  PT Goal Progression  Progress towards PT goals Progressing toward goals  PT Time Calculation  PT Start Time (ACUTE ONLY) 1452  PT Stop Time (ACUTE ONLY) 1520  PT Time Calculation (min) (ACUTE ONLY) 28 min  PT G-Codes **NOT FOR INPATIENT CLASS**  Functional  Assessment Tool Used AM-PAC 6 Clicks Basic Mobility  PT General Charges  $$ ACUTE PT VISIT 1 Visit  PT Treatments  $Gait Training 8-22 mins  $Therapeutic Exercise 8-22 mins   Mee Hives, PT MS Acute Rehab Dept. Number: [place] and [place]

## 2016-10-18 NOTE — Progress Notes (Signed)
Patient unsteady on feet when ambulating with PT. Agreeable for rehab now Discharge tomorrow to STR

## 2016-10-18 NOTE — NC FL2 (Signed)
[place] LEVEL OF CARE SCREENING TOOL     IDENTIFICATION  Patient Name: Steve Bryant Birthdate: May 06, 1925 Sex: male Admission Date (Current Location): 10/16/2016  [place] and [place] Number:  Engineering geologist and Address:  [facility], [address] [place], [place] [address]      Provider Number: [id number]  Attending Physician Name and Address:  Gladstone Lighter, MD  Relative Name and Phone Number:  Dorise Bullion [facility], Daughter) [phone]    Current Level of Care: Hospital Recommended Level of Care: [place] Prior Approval Number:    Date Approved/Denied: 10/18/16 PASRR Number: [id number]  Discharge Plan: SNF    Current Diagnoses: Patient Active Problem List   Diagnosis Date Noted  . CVA (cerebral vascular accident) ([place]) 10/15/2016  . Essential hypertension, benign 09/18/2016  . Stroke ([place]) 09/18/2016  . AAA (abdominal aortic aneurysm) without rupture ([place]) 09/18/2016    Orientation RESPIRATION BLADDER Height & Weight     Self, Time, Situation, Place  Normal Continent Weight:   Height:     BEHAVIORAL SYMPTOMS/MOOD NEUROLOGICAL BOWEL NUTRITION STATUS      Continent Diet (Heart healthy)  AMBULATORY STATUS COMMUNICATION OF NEEDS Skin   Extensive Assist Verbally Normal                       Personal Care Assistance Level of Assistance  Bathing, Feeding, Dressing Bathing Assistance: Limited assistance Feeding assistance: Independent Dressing Assistance: Limited assistance     Functional Limitations Info  Sight Sight Info: Impaired (Patient is legally blind.)        SPECIAL CARE FACTORS FREQUENCY  PT (By licensed PT), OT (By licensed OT)     PT Frequency: Up to 5X per day, 5 days per week OT Frequency: Up to 5X per day, 5 days per week            Contractures Contractures Info: Not present    Additional Factors Info  Code Status, Allergies Code Status Info:  Full Allergies Info: Shellfish-derived Products           Current Medications (10/18/2016):  This is the current hospital active medication list Current Facility-Administered Medications  Medication Dose Route Frequency Provider Last Rate Last Dose  . acetaminophen (TYLENOL) tablet 650 mg  650 mg Oral Q4H PRN Fritzi Mandes, MD       Or  . acetaminophen (TYLENOL) solution 650 mg  650 mg Per Tube Q4H PRN Fritzi Mandes, MD       Or  . acetaminophen (TYLENOL) suppository 650 mg  650 mg Rectal Q4H PRN Fritzi Mandes, MD      . amLODipine (NORVASC) tablet 2.5 mg  2.5 mg Oral Daily Fritzi Mandes, MD   2.5 mg at 10/18/16 1028  . aspirin EC tablet 81 mg  81 mg Oral Daily Fritzi Mandes, MD   81 mg at 10/18/16 1028  . clopidogrel (PLAVIX) tablet 75 mg  75 mg Oral Daily Fritzi Mandes, MD   75 mg at 10/18/16 1028  . dorzolamide-timolol (COSOPT) 22.3-6.8 MG/ML ophthalmic solution 1 drop  1 drop Both Eyes BID Fritzi Mandes, MD   1 drop at 10/18/16 1029  . enoxaparin (LOVENOX) injection 40 mg  40 mg Subcutaneous Q24H Fritzi Mandes, MD   40 mg at 10/17/16 2207  . latanoprost (XALATAN) 0.005 % ophthalmic solution 1 drop  1 drop Both Eyes QHS Fritzi Mandes, MD   1 drop at 10/17/16 2206  . losartan (COZAAR) tablet  25 mg  25 mg Oral Daily Fritzi Mandes, MD   25 mg at 10/18/16 1027  . Melatonin TABS 10 mg  10 mg Oral QHS Fritzi Mandes, MD   10 mg at 10/17/16 2211  . multivitamin with minerals tablet 1 tablet  1 tablet Oral Daily Fritzi Mandes, MD   1 tablet at 10/18/16 1028  . pantoprazole (PROTONIX) EC tablet 40 mg  40 mg Oral Daily Fritzi Mandes, MD   40 mg at 10/18/16 1027  . pregabalin (LYRICA) capsule 75 mg  75 mg Oral TID Fritzi Mandes, MD   75 mg at 10/18/16 1027  . rosuvastatin (CRESTOR) tablet 5 mg  5 mg Oral QHS Fritzi Mandes, MD   5 mg at 10/17/16 2210  . senna-docusate (Senokot-S) tablet 1 tablet  1 tablet Oral QHS PRN Fritzi Mandes, MD         Discharge Medications: Please see discharge summary for a list of discharge  medications.  Relevant Imaging Results:  Relevant Lab Results:   Additional Information SS# [id number]  Zettie Pho, LCSW

## 2016-10-18 NOTE — Clinical Social Work Note (Signed)
Clinical Social Work Assessment  Patient Details  Name: Steve Bryant MRN: [id number] Date of Birth: 1925/10/02  Date of referral:  10/18/16               Reason for consult:  Facility Placement                Permission sought to share information with:  Chartered certified accountant granted to share information::  Yes, Verbal Permission Granted  Name::        Agency::     Relationship::     Contact Information:     Housing/Transportation Living arrangements for the past 2 months:  Single Family Home Source of Information:  Patient, Medical Team, Adult Children Patient Interpreter Needed:  None Criminal Activity/Legal Involvement Pertinent to Current Situation/Hospitalization:  No - Comment as needed Significant Relationships:  Adult Children Lives with:  Self Do you feel safe going back to the place where you live?  Yes Need for family participation in patient care:  Yes (Comment) (Patient has intermittent confusion. May not be capable of making medical decisions temporarily.)  Care giving concerns:  PT recommendation for STR   Social Worker assessment / plan:  CSW met with the patient, his daughter Steve Bryant Education officer, community) and his son-in-law, Steve Bryant, at bedside to discuss discharge planning. The patient maintains that he wants to return home, and his family reports that they would like him to pursue STR as he does not have adequate 24-hour care in place for his recovery period. Currently, the patient has intermittent confusion and limited insight into his abilities. The patient is unable to ambulate independently. While the patient is willing to pay out of pocket for 24 hour care, the family indicates that he would most likely say that to convince the attending but would not actually hire a CNA.  Steve Bryant, the patient's daughter and [name] has reported that [facility] is the preferred location if possible for STR. The family will continue to discuss the limited options with the  patient, and he will most likely discharge to SNF tomorrow. If the patient continues to refuse SNF, then a psych consult for capacity may be needed.  Employment status:  Retired Forensic scientist:  [employer] PT Recommendations:  [place] / Referral to community resources:  [place]  Patient/Family's Response to care:  The patient was pleasant as was the family. They all thanked the CSW for assistance. Patient/Family's Understanding of and Emotional Response to Diagnosis, Current Treatment, and Prognosis:  The patient's family understands the need for STR; however, the patient has limited insight which may put him at risk of readmission should he return home without adequate skilled support.  Emotional Assessment Appearance:  Appears stated age Attitude/Demeanor/Rapport:   (Pleasantly stubborn) Affect (typically observed):  Frustrated, In denial Orientation:  Oriented to Self, Oriented to Place, Oriented to  Time, Oriented to Situation Alcohol / Substance use:  Never Used Psych involvement (Current and /or in the community):  No (Comment)  Discharge Needs  Concerns to be addressed:  Care Coordination, Discharge Planning Concerns Readmission within the last 30 days:  Yes Current discharge risk:  Physical Impairment, Lives alone Barriers to Discharge:  Continued Medical Work up, Unsafe home situation (Patient in denial about ability to care for himself at home safely.)   Zettie Pho, LCSW 10/18/2016, 11:59 AM

## 2016-10-18 NOTE — Consult Note (Signed)
L facial droop persists and pt is unsteady on his feet   Past Medical History:  Diagnosis Date  . AAA (abdominal aortic aneurysm) ([place])   . Arthritis   . Glaucoma   . Hypertension   . Stroke [facility])     Past Surgical History:  Procedure Laterality Date  . EYE SURGERY    . neck fusion    . TUMOR REMOVAL      Family History  Problem Relation Age of Onset  . Stroke Mother   . Stroke Father   . Cancer Brother     Social History:  reports that he has quit smoking. He has never used smokeless tobacco. He reports that he drinks alcohol. He reports that he does not use drugs.  Allergies  Allergen Reactions  . Shellfish-Derived Products Nausea Only    Scallops    Medications: I have reviewed the patient's current medications.  ROS: History obtained from the patient  General ROS: negative for - chills, fatigue, fever, night sweats, weight gain or weight loss Psychological ROS: negative for - behavioral disorder, hallucinations, memory difficulties, mood swings or suicidal ideation Ophthalmic ROS: negative for - blurry vision, double vision, eye pain or loss of vision ENT ROS: negative for - epistaxis, nasal discharge, oral lesions, sore throat, tinnitus or vertigo Allergy and Immunology ROS: negative for - hives or itchy/watery eyes Hematological and Lymphatic ROS: negative for - bleeding problems, bruising or swollen lymph nodes Endocrine ROS: negative for - galactorrhea, hair pattern changes, polydipsia/polyuria or temperature intolerance Respiratory ROS: negative for - cough, hemoptysis, shortness of breath or wheezing Cardiovascular ROS: negative for - chest pain, dyspnea on exertion, edema or irregular heartbeat Gastrointestinal ROS: negative for - abdominal pain, diarrhea, hematemesis, nausea/vomiting or stool incontinence Genito-Urinary ROS: negative for - dysuria, hematuria, incontinence or urinary frequency/urgency Musculoskeletal ROS: negative for - joint swelling or  muscular weakness Neurological ROS: as noted in HPI Dermatological ROS: negative for rash and skin lesion changes  Physical Examination: Blood pressure 136/60, pulse 62, temperature 97.6 F (36.4 C), temperature source Oral, resp. rate 18, SpO2 96 %.   Neurological Examination   Mental Status: Alert, oriented, thought content appropriate.  Speech fluent without evidence of aphasia.  Able to follow 3 step commands without difficulty. Cranial Nerves: II: Discs flat bilaterally; Visual fields grossly normal, pupils equal, round, reactive to light and accommodation III,IV, VI: ptosis not present, extra-ocular motions intact bilaterally V,VII: smile L facial droop VIII: hearing normal bilaterally IX,X: gag reflex present XI: bilateral shoulder shrug XII: midline tongue extension Motor: Right : Upper extremity   5/5    Left:     Upper extremity   5/5  Lower extremity   5/5     Lower extremity   4+/5 Tone and bulk:normal tone throughout; no atrophy noted Sensory: Pinprick and light touch intact throughout, bilaterally Deep Tendon Reflexes: 1+ and symmetric throughout Plantars: Right: downgoing   Left: downgoing Cerebellar: normal finger-to-nose, normal rapid alternating movements and normal heel-to-shin test Gait: unsteady with L foot weakness      Laboratory Studies:   Basic Metabolic Panel:  Recent Labs Lab 10/15/16 0919 10/16/16 1356 10/18/16 0548  NA 144 141 141  K 4.1 4.3 3.7  CL 110 108 108  CO2 27 26 25   GLUCOSE 106* 122* 87  BUN 20 17 14   CREATININE 1.16 1.39* 1.10  CALCIUM 9.4 9.5 8.9    Liver Function Tests:  Recent Labs Lab 10/15/16 0919 10/16/16 1356  AST 24 23  ALT 13* 13*  ALKPHOS 54 56  BILITOT 0.9 1.1  PROT 6.6 6.8  ALBUMIN 3.8 3.8   No results for input(s): LIPASE, AMYLASE in the last 168 hours. No results for input(s): AMMONIA in the last 168 hours.  CBC:  Recent Labs Lab 10/15/16 0919 10/16/16 1356  WBC 9.1 9.2  NEUTROABS 5.7 6.5   HGB 14.5 15.4  HCT 44.5 46.6  MCV 87.7 86.8  PLT 231 234    Cardiac Enzymes:  Recent Labs Lab 10/15/16 0919 10/16/16 1356  TROPONINI <0.03 <0.03    BNP: Invalid input(s): POCBNP  CBG:  Recent Labs Lab 10/15/16 0921 10/16/16 1420 10/17/16 1644  GLUCAP 77 117* 105*    Microbiology: Results for orders placed or performed in visit on 02/27/12  Influenza A&B Antigens [facility])     Status: None   Collection Time: 02/27/12  5:07 AM  Result Value Ref Range Status   Micro Text Report   Final       COMMENT                   NEGATIVE FOR INFLUENZA A (ANTIGEN ABSENT)   COMMENT                   NEGATIVE FOR INFLUENZA B (ANTIGEN ABSENT)   ANTIBIOTIC                                                        Coagulation Studies:  Recent Labs  10/16/16 1356  LABPROT 12.2  INR 0.91    Urinalysis: No results for input(s): COLORURINE, LABSPEC, PHURINE, GLUCOSEU, HGBUR, BILIRUBINUR, KETONESUR, PROTEINUR, UROBILINOGEN, NITRITE, LEUKOCYTESUR in the last 168 hours.  Invalid input(s): APPERANCEUR  Lipid Panel:     Component Value Date/Time   CHOL 179 10/17/2016 0543   TRIG 161 (H) 10/17/2016 0543   HDL 42 10/17/2016 0543   CHOLHDL 4.3 10/17/2016 0543   VLDL 32 10/17/2016 0543   LDLCALC 105 (H) 10/17/2016 0543    HgbA1C: No results found for: HGBA1C  Urine Drug Screen:  No results found for: LABOPIA, COCAINSCRNUR, LABBENZ, AMPHETMU, THCU, LABBARB  Alcohol Level: No results for input(s): ETH in the last 168 hours.   Imaging: Mr Brain Wo Contrast  Result Date: 10/16/2016 CLINICAL DATA:  Initial evaluation for acute stroke. EXAM: MRI HEAD WITHOUT CONTRAST TECHNIQUE: Multiplanar, multiecho pulse sequences of the brain and surrounding structures were obtained without intravenous contrast. COMPARISON:  Prior CT from earlier the same day. FINDINGS: Brain: Diffuse prominence of the CSF containing spaces is compatible with generalized cerebral atrophy. Advanced chronic  microvascular ischemic disease again noted. Remote lacunar infarcts within the left thalamus and pons. Scattered bilateral remote cerebellar infarcts, with additional remote right parietal cortical infarct. Changes are stable from previous. There is subtle diffusion abnormality involving the right paramedian ventral pons (series 100, image 16). Subtle signal loss seen on corresponding ADC map (series 4, image 16). Finding consistent with acute ischemic infarct. Area of involvement measures approximately 13 x 14 mm. No associated hemorrhage or mass effect. No other evidence for acute ischemia. Chronic micro hemorrhages within the left cerebellum and pons noted. No mass lesion or midline shift. No mass effect. No hydrocephalus. No extra-axial fluid collection. Major dural sinuses are grossly patent. Pituitary gland suprasellar region within normal limits. Vascular: Major  intracranial vascular flow voids are maintained. Skull and upper cervical spine: Craniocervical junction within normal limits. Postsurgical changes noted within the upper cervical spine. Bone marrow signal intensity within normal limits. Scalp soft tissues unremarkable. Sinuses/Orbits: Globes and orbital soft tissues within normal limits. Patient status post lens extraction bilaterally. Extensive paranasal sinus disease again noted, stable. No significant mastoid effusion. Inner ear structures normal. IMPRESSION: 1. 14 mm acute ischemic nonhemorrhagic infarct involving the right paramedian ventral pons. No associated mass effect. 2. Otherwise stable atrophy with severe chronic ischemic disease as above. Electronically Signed   By: Jeannine Boga M.D.   On: 10/16/2016 16:51     Assessment/Plan: Pt seen yesterday prior to d/c and had only mild L facial droop. One hour post d/c stated could not get out of car and was found to have L sided weakness. Currently has same L facial droop and another focal deficits.   Was not TPA candidate as  symptoms still persistent with L facial droop. Pt has R pontine stroke.  On ASA and plavix at this point.  Stroke is likely from small vessel dz and pontine perforators coming from posterior circulation which is open  Con't ASA and Plavix Unsteady on his feet and will need Rehab placement No further imaging Call with questions.    Leotis Pain     10/18/2016, 3:19 PM

## 2016-10-18 NOTE — Progress Notes (Signed)
[place] at [place] NAME: Steve Bryant    MR#:  [id number]  DATE OF BIRTH:  December 18, 1925  SUBJECTIVE:  CHIEF COMPLAINT:   Chief Complaint  Patient presents with  . Code Stroke   - Admitted with left-sided weakness from right pontine acute infarct. Still has some weakness and unsteady gait. Refusing to go to rehabilitation. -Physical therapy to reevaluate today. Intermittent confusion noted yesterday, more alert and oriented this morning  REVIEW OF SYSTEMS:  Review of Systems  Constitutional: Negative for chills, fever and malaise/fatigue.  HENT: Negative for congestion, ear discharge, hearing loss and nosebleeds.   Eyes: Positive for blurred vision.  Respiratory: Negative for cough, shortness of breath and wheezing.   Cardiovascular: Negative for chest pain, palpitations and leg swelling.  Gastrointestinal: Negative for abdominal pain, constipation, diarrhea, nausea and vomiting.  Genitourinary: Negative for dysuria.  Musculoskeletal: Negative for myalgias.  Neurological: Positive for focal weakness and weakness. Negative for dizziness, sensory change, speech change, seizures and headaches.  Psychiatric/Behavioral: Negative for depression.    DRUG ALLERGIES:   Allergies  Allergen Reactions  . Shellfish-Derived Products Nausea Only    Scallops    VITALS:  Blood pressure (!) 147/66, pulse (!) 57, temperature 97.7 F (36.5 C), temperature source Oral, resp. rate 18, SpO2 95 %.  PHYSICAL EXAMINATION:  Physical Exam  GENERAL:  83 y.o.-year-old elderly patient sitting in the chair with no acute distress.  EYES: Pupils equal, round, sluggishly reactive to light and accommodation. No scleral icterus. Extraocular muscles intact.  HEENT: Head atraumatic, normocephalic. Oropharynx and nasopharynx clear. left facial droop noted NECK:  Supple, no jugular venous distention. No thyroid enlargement, no tenderness.  LUNGS: Normal breath  sounds bilaterally, no wheezing, rales,rhonchi or crepitation. No use of accessory muscles of respiration. Decreased bibasilar breath sounds. CARDIOVASCULAR: S1, S2 normal. No rubs, or gallops. 3/6 systolic murmur present ABDOMEN: Soft, non-tender, non-distended. Bowel sounds present. No organomegaly or mass.  EXTREMITIES: No pedal edema, cyanosis, or clubbing.  NEUROLOGIC: Cranial nerves II through XII are intact except decreased visual acuity with bilateral tunnel vision, left better than the right. And left facial droop. - Muscle strength 5-/5 in all extremities. Sensation intact. Gait not checked.  PSYCHIATRIC: The patient is alert and oriented x 3.  SKIN: No obvious rash, lesion, or ulcer. [name]    LABORATORY PANEL:   CBC  Recent Labs Lab 10/16/16 1356  WBC 9.2  HGB 15.4  HCT 46.6  PLT 234   ------------------------------------------------------------------------------------------------------------------  Chemistries   Recent Labs Lab 10/16/16 1356 10/18/16 0548  NA 141 141  K 4.3 3.7  CL 108 108  CO2 26 25  GLUCOSE 122* 87  BUN 17 14  CREATININE 1.39* 1.10  CALCIUM 9.5 8.9  AST 23  --   ALT 13*  --   ALKPHOS 56  --   BILITOT 1.1  --    ------------------------------------------------------------------------------------------------------------------  Cardiac Enzymes  Recent Labs Lab 10/16/16 1356  TROPONINI <0.03   ------------------------------------------------------------------------------------------------------------------  RADIOLOGY:  Mr Brain Wo Contrast  Result Date: 10/16/2016 CLINICAL DATA:  Initial evaluation for acute stroke. EXAM: MRI HEAD WITHOUT CONTRAST TECHNIQUE: Multiplanar, multiecho pulse sequences of the brain and surrounding structures were obtained without intravenous contrast. COMPARISON:  Prior CT from earlier the same day. FINDINGS: Brain: Diffuse prominence of the CSF containing spaces is compatible with generalized cerebral  atrophy. Advanced chronic microvascular ischemic disease again noted. Remote lacunar infarcts within the left thalamus and pons. Scattered bilateral  remote cerebellar infarcts, with additional remote right parietal cortical infarct. Changes are stable from previous. There is subtle diffusion abnormality involving the right paramedian ventral pons (series 100, image 16). Subtle signal loss seen on corresponding ADC map (series 4, image 16). Finding consistent with acute ischemic infarct. Area of involvement measures approximately 13 x 14 mm. No associated hemorrhage or mass effect. No other evidence for acute ischemia. Chronic micro hemorrhages within the left cerebellum and pons noted. No mass lesion or midline shift. No mass effect. No hydrocephalus. No extra-axial fluid collection. Major dural sinuses are grossly patent. Pituitary gland suprasellar region within normal limits. Vascular: Major intracranial vascular flow voids are maintained. Skull and upper cervical spine: Craniocervical junction within normal limits. Postsurgical changes noted within the upper cervical spine. Bone marrow signal intensity within normal limits. Scalp soft tissues unremarkable. Sinuses/Orbits: Globes and orbital soft tissues within normal limits. Patient status post lens extraction bilaterally. Extensive paranasal sinus disease again noted, stable. No significant mastoid effusion. Inner ear structures normal. IMPRESSION: 1. 14 mm acute ischemic nonhemorrhagic infarct involving the right paramedian ventral pons. No associated mass effect. 2. Otherwise stable atrophy with severe chronic ischemic disease as above. Electronically Signed   By: Jeannine Boga M.D.   On: 10/16/2016 16:51   Ct Head Code Stroke Wo Contrast  Result Date: 10/16/2016 CLINICAL DATA:  Code stroke. Generalized bilateral weakness. Slurred speech. EXAM: CT HEAD WITHOUT CONTRAST TECHNIQUE: Contiguous axial images were obtained from the base of the skull  through the vertex without intravenous contrast. COMPARISON:  Brain MRI 10/15/2016 FINDINGS: Brain: No mass lesion or acute hemorrhage. No focal hypoattenuation of the basal ganglia or cortex to indicate infarcted tissue. There is generalized mild for age atrophy. There is periventricular hypoattenuation compatible with chronic microvascular disease. Old left pontine infarct and bilateral old cerebellar infarcts. Vascular: No hyperdense vessel. No advanced atherosclerotic calcification of the arteries at the skull base. Skull: Normal visualized skull base, calvarium and extracranial soft tissues. Sinuses/Orbits: Near complete opacification of the visualized left ethmoid, sphenoid and maxillary sinuses. Normal orbits. ASPECTS [facility] Stroke Program Early CT Score) - Ganglionic level infarction (caudate, lentiform nuclei, internal capsule, insula, M1-M3 cortex): 7 - Supraganglionic infarction (M4-M6 cortex): 3 Total score (0-10 with 10 being normal): 10 IMPRESSION: 1. No acute hemorrhage or mass lesion. 2. Severe chronic microvascular ischemia. 3. ASPECTS is 10. These results were called by telephone at the time of interpretation on 10/16/2016 at 1:57 pm to Dr. Delman Kitten , who verbally acknowledged these results. Electronically Signed   By: Ulyses Jarred M.D.   On: 10/16/2016 14:02    EKG:   Orders placed or performed during the hospital encounter of 10/16/16  . ED EKG  . ED EKG    ASSESSMENT AND PLAN:   83 year old gentleman with past medical history significant for history of CVA with no residual neurological deficits, hypertension, glaucoma, legal blindness presents to hospital secondary to generalized weakness associated with dysarthria and left facial droop.  #1 acute CVA- readmitted with left arm weakness and noted to have right pontine infarct on MRI repeat. -CT angiogram of the head and neck were done last admission 2 days ago. It was revealing prior right carotid endarterectomy but left-sided  stenosis of about 60% with significant atherosclerotic disease. -Patient was on aspirin prior to this past admission and was started on Plavix which we will continue. Neurology consulted. - ECHO normal with EF of 55-60%, no cardiac source of emboli noted -On low-dose statin has also  been started for LDL greater than 105. -Physical therapy and occupational therapy reconsulted and have recommended either rehabilitation or home with continuous supervision. Patient is adamant about going back home. -Discussed with his daughter Ms. [name] over the phone who agreed that she needs to go to rehabilitation and is on her way to convince the patient. But also understands that she needs to rest but dad's wishes and if he wants to go home, they're willing to take him home and provide supervision.  #2 hypertension- on low dose losartan and Norvasc. Blood pressure was low last admission so Norvasc was decreased and, increased back to 5 mg today. Will avoid low blood pressures to prevent further strokes  #3 glaucoma-continue his eyedrops  #4 GERD-PPI  #5 DVT prophylaxis-on Lovenox  Anticipate discharge today if it's home, or tomorrow to rehabilitation   All the records are reviewed and case discussed with Care Management/Social Workerr. Management plans discussed with the patient, family and they are in agreement.  CODE STATUS: Full code  TOTAL TIME TAKING CARE OF THIS PATIENT: 36 minutes.   POSSIBLE D/C TODAY OR TOMORROW, DEPENDING ON CLINICAL CONDITION.   Gladstone Lighter M.D on 10/18/2016 at 10:38 AM  Between 7am to 6pm - Pager - [phone]  After 6pm go to [email or web address] - password [email or web address] [facility]  Office  [phone]  CC: Primary care physician; [name], MD

## 2016-10-19 NOTE — Plan of Care (Signed)
Problem: Education: Goal: Knowledge of [facility] Education information/materials will improve Outcome: Progressing VS WDL, free of falls during shift.  Ambulated to bathroom multiple times during shift.  Denies pain.  NIH 4, neuro checks stable.  No needs overnight.  Bed in low position, call bell within reach.  WCTM.

## 2016-10-19 NOTE — Discharge Instructions (Signed)
Heart healthy diet ° °Activity with assistance °

## 2016-10-19 NOTE — Care Management (Signed)
Patient discharging to [facility].  CSW facilitating. Corene Cornea with [place] notified.  RNCM singing off.

## 2016-10-19 NOTE — Discharge Summary (Signed)
[employer] at [place] NAME: Steve Bryant    MR#:  [id number]  DATE OF BIRTH:  [birth year]  DATE OF ADMISSION:  10/16/2016 ADMITTING PHYSICIAN: Steve Mandes, MD  DATE OF DISCHARGE: 10/19/2016  PRIMARY CARE PHYSICIAN: Steve Pitch, MD   ADMISSION DIAGNOSIS:  Lacunar stroke ([place]) [I63.9] Left-sided muscle weakness [M62.81]  DISCHARGE DIAGNOSIS:  Active Problems:   CVA (cerebral vascular accident) ([place])   SECONDARY DIAGNOSIS:   Past Medical History:  Diagnosis Date  . AAA (abdominal aortic aneurysm) ([place])   . Arthritis   . Glaucoma   . Hypertension   . Stroke [facility])      ADMITTING HISTORY  HISTORY OF PRESENT ILLNESS:  Steve Bryant  is a 83 y.o. male with a known history of CVA in the remote past, hypertension, arthritis, AAA was admitted 10/15/2016 discharge 10/16/2016 with TIA comes back to the emergency room after patient got discharged went out to eat and per daughter she was not able to move his left upper and lower extremity. He was very weak he could not get out of the car. He came back to the emergency room CT of the head was negative for acute stroke. Patient's symptoms had resolved by then. ER physician spoke with neurology recommended MRI of the brain. Patient currently is asymptomatic. MRI of the brain shows small right lacunar infarct in the pons. Patient is being admitted with acute CVA    HOSPITAL COURSE:   83 year old gentleman with past medical history significant for history of CVA with no residual neurological deficits, hypertension, glaucoma, legal blindness presents to hospital secondary to generalized weakness associated with dysarthria and left facial droop.  # Acute right pontine CVA -CT angiogram of the head and neck - prior right carotid endarterectomy but left-sided stenosis of about 60% with significant atherosclerotic disease. -Patient was on aspirin prior to admission. Started plavix. - ECHO  normal with EF of 55-60%, no cardiac source of emboli noted - low-dose statin has also been started for LDL greater than 105. Crestor. - Rehab for further PT/OT. Seen by neurology. CVA due to small vessel disease.  #2 hypertension- on low dose losartan and Norvasc. Well controlled.  #3 glaucoma-continue his eyedrops  #4 GERD-PPI  Stable for discharge to SNF  CONSULTS OBTAINED:  Treatment Team:  Steve Pain, MD  DRUG ALLERGIES:   Allergies  Allergen Reactions  . Shellfish-Derived Products Nausea Only    Scallops    DISCHARGE MEDICATIONS:   Current Discharge Medication List    CONTINUE these medications which have CHANGED   Details  amLODipine (NORVASC) 5 MG tablet Take 1 tablet (5 mg total) by mouth daily. Qty: 30 tablet, Refills: 2      CONTINUE these medications which have NOT CHANGED   Details  aspirin EC 81 MG tablet Take by mouth.    dorzolamide-timolol (COSOPT) 22.3-6.8 MG/ML ophthalmic solution INSTILL ONE DROP TWICE DAILY TO BOTH EYES Refills: 2    latanoprost (XALATAN) 0.005 % ophthalmic solution USE 1 DROP IN THE RIGHT EYE AT BEDTIME AS DIRECTED Refills: 2    losartan (COZAAR) 25 MG tablet Take 1 tablet (25 mg total) by mouth daily. Qty: 30 tablet, Refills: 2    LYRICA 75 MG capsule Take 75 mg by mouth 3 (three) times daily. Refills: 0    Melatonin 10 MG TABS Take 1 tablet by mouth at bedtime.    Multiple Vitamin (MULTIVITAMIN) tablet Take 1 tablet by mouth daily.    omeprazole (  PRILOSEC) 20 MG capsule Take 20 mg by mouth daily.    clopidogrel (PLAVIX) 75 MG tablet Take 1 tablet (75 mg total) by mouth daily. Qty: 30 tablet, Refills: 11    rosuvastatin (CRESTOR) 5 MG tablet Take 1 tablet (5 mg total) by mouth at bedtime. Qty: 30 tablet, Refills: 2        Today   VITAL SIGNS:  Blood pressure (!) 154/67, pulse 62, temperature 98.2 F (36.8 C), temperature source Oral, resp. rate 18, SpO2 98 %.  I/O:   Intake/Output Summary (Last  24 hours) at 10/19/16 1040 Last data filed at 10/19/16 0955  Gross per 24 hour  Intake              360 ml  Output                0 ml  Net              360 ml    PHYSICAL EXAMINATION:  Physical Exam  GENERAL:  83 y.o.-year-old patient lying in the bed with no acute distress.  LUNGS: Normal breath sounds bilaterally, no wheezing, rales,rhonchi or crepitation. No use of accessory muscles of respiration.  CARDIOVASCULAR: S1, S2 normal. No murmurs, rubs, or gallops.  ABDOMEN: Soft, non-tender, non-distended. Bowel sounds present. No organomegaly or mass.  NEUROLOGIC: Motor 4+/5 LUE and LLE. 5/5 on right PSYCHIATRIC: The patient is alert and oriented x 3.  SKIN: No obvious rash, lesion, or ulcer.   DATA REVIEW:   CBC  Recent Labs Lab 10/16/16 1356  WBC 9.2  HGB 15.4  HCT 46.6  PLT 234    Chemistries   Recent Labs Lab 10/16/16 1356 10/18/16 0548  NA 141 141  K 4.3 3.7  CL 108 108  CO2 26 25  GLUCOSE 122* 87  BUN 17 14  CREATININE 1.39* 1.10  CALCIUM 9.5 8.9  AST 23  --   ALT 13*  --   ALKPHOS 56  --   BILITOT 1.1  --     Cardiac Enzymes  Recent Labs Lab 10/16/16 1356  TROPONINI <0.03    Microbiology Results  Results for orders placed or performed in visit on 02/27/12  Influenza A&B Antigens [facility])     Status: None   Collection Time: 02/27/12  5:07 AM  Result Value Ref Range Status   Micro Text Report   Final       COMMENT                   NEGATIVE FOR INFLUENZA A (ANTIGEN ABSENT)   COMMENT                   NEGATIVE FOR INFLUENZA B (ANTIGEN ABSENT)   ANTIBIOTIC                                                        RADIOLOGY:  No results found.  Follow up with PCP in 1 week.  Management plans discussed with the patient, family and they are in agreement.  CODE STATUS:     Code Status Orders        Start     Ordered   10/16/16 1908  Full code  Continuous     10/16/16 1907    Code Status History  Date Active Date Inactive Code  Status Order ID Comments User Context   10/15/2016  2:23 PM 10/16/2016  1:39 PM Full Code [id number]  Steve Mandes, MD Inpatient    Advance Directive Documentation     Most Recent Value  Type of Advance Directive  Living will  Pre-existing out of facility DNR order (yellow form or pink MOST form)  -  "MOST" Form in Place?  -      TOTAL TIME TAKING CARE OF THIS PATIENT ON DAY OF DISCHARGE: more than 30 minutes.   Hillary Bow R M.D on 10/19/2016 at 10:40 AM  Between 7am to 6pm - Pager - [phone]  After 6pm go to [email or web address] - password [email or web address] [facility]  Office  [phone]  CC: Primary care physician; Steve Pitch, MD  Note: This dictation was prepared with Dragon dictation along with smaller phrase technology. Any transcriptional errors that result from this process are unintentional.

## 2016-10-19 NOTE — Care Management Important Message (Signed)
Important Message  Patient Details  Name: Steve Bryant MRN: [id number] Date of Birth: [birth year]   Medicare Important Message Given:  Yes    Beverly Sessions, RN 10/19/2016, 10:50 AM

## 2016-10-19 NOTE — Clinical Social Work Note (Signed)
Pt is ready for discharge today and will go to [facility]. Pt and family are aware and agreeable to discharge plan. Facility is ready to admit pt as discharge information has been received. RN will call report. [facility] will provide transportation. CSW is signing off as no further needs identified.   Steve Bryant, MSW, LCSW  Clinical Social Worker  [phone]

## 2016-10-19 NOTE — Clinical Social Work Placement (Signed)
   CLINICAL SOCIAL WORK PLACEMENT  NOTE  Date:  10/19/2016  Patient Details  Name: Steve Bryant MRN: [id number] Date of Birth: 10-Nov-1925  Clinical Social Work is seeking post-discharge placement for this patient at the [place] level of care (*CSW will initial, date and re-position this form in  chart as items are completed):  Yes   Patient/family provided with [place] Work Department's list of facilities offering this level of care within the geographic area requested by the patient (or if unable, by the patient's family).  Yes   Patient/family informed of their freedom to choose among providers that offer the needed level of care, that participate in Medicare, Medicaid or managed care program needed by the patient, have an available bed and are willing to accept the patient.  Yes   Patient/family informed of [facility]'s ownership interest in [facility] and [facility], as well as of the fact that they are under no obligation to receive care at these facilities.  PASRR submitted to EDS on 10/18/16     PASRR number received on 10/18/16     Existing PASRR number confirmed on       FL2 transmitted to all facilities in geographic area requested by pt/family on 10/18/16     FL2 transmitted to all facilities within larger geographic area on       Patient informed that his/her managed care company has contracts with or will negotiate with certain facilities, including the following:        Yes   Patient/family informed of bed offers received.  Patient chooses bed at [facility]     Physician recommends and patient chooses bed at      Patient to be transferred to [facility] on 10/19/16.  Patient to be transferred to facility by [facility]     Patient family notified on 10/19/16 of transfer.  Name of family member notified:  [name], daughter and [place].      PHYSICIAN       Additional Comment:     _______________________________________________ Darden Dates, LCSW 10/19/2016, 3:46 PM

## 2016-10-19 NOTE — Progress Notes (Signed)
Pt is being discharged to [facility]. Report given to [facility], LPN.  AVS given and explained to pt and family, all verbalized understanding. 2nd copy of AVS placed in discharged envelope for facility. Meds and f/u appointments reviewed. Awaiting EMS.

## 2016-10-20 DIAGNOSIS — I10 Essential (primary) hypertension: Secondary | ICD-10-CM | POA: Diagnosis not present

## 2016-10-20 DIAGNOSIS — G4700 Insomnia, unspecified: Secondary | ICD-10-CM | POA: Diagnosis not present

## 2016-10-20 DIAGNOSIS — G8192 Hemiplegia, unspecified affecting left dominant side: Secondary | ICD-10-CM

## 2016-10-20 DIAGNOSIS — G9009 Other idiopathic peripheral autonomic neuropathy: Secondary | ICD-10-CM | POA: Diagnosis not present

## 2016-11-04 DIAGNOSIS — E785 Hyperlipidemia, unspecified: Secondary | ICD-10-CM | POA: Insufficient documentation

## 2016-12-22 ENCOUNTER — Encounter (INDEPENDENT_AMBULATORY_CARE_PROVIDER_SITE_OTHER): Payer: Self-pay | Admitting: Vascular Surgery

## 2016-12-22 ENCOUNTER — Ambulatory Visit (INDEPENDENT_AMBULATORY_CARE_PROVIDER_SITE_OTHER): Payer: Medicare Other

## 2016-12-22 ENCOUNTER — Ambulatory Visit (INDEPENDENT_AMBULATORY_CARE_PROVIDER_SITE_OTHER): Payer: Medicare Other | Admitting: Vascular Surgery

## 2016-12-22 VITALS — BP 124/66 | HR 52 | Resp 15 | Ht 69.0 in | Wt 154.0 lb

## 2016-12-22 DIAGNOSIS — I714 Abdominal aortic aneurysm, without rupture, unspecified: Secondary | ICD-10-CM

## 2016-12-22 DIAGNOSIS — I639 Cerebral infarction, unspecified: Secondary | ICD-10-CM

## 2016-12-22 DIAGNOSIS — I6381 Other cerebral infarction due to occlusion or stenosis of small artery: Secondary | ICD-10-CM | POA: Diagnosis not present

## 2016-12-22 DIAGNOSIS — I10 Essential (primary) hypertension: Secondary | ICD-10-CM

## 2016-12-22 NOTE — Progress Notes (Signed)
MRN : [id number]  Steve Bryant is a 83 y.o. (1925-08-13) male who presents with chief complaint of  Chief Complaint  Patient presents with  . Follow-up    3 month AAA  .  History of Present Illness: Patient returns today in follow up of his AAA.  He has had no major changes or problems since his last visit about 3 months ago.  He denies any current aneurysm related symptoms. Specifically, the patient denies new back or abdominal pain, or signs of peripheral embolization.  His aortic duplex today shows no change in his abdominal aortic aneurysm measuring 4.50 cm in maximal diameter.  This is the same as it was on his CT scan from 3-4 months ago.  Current Outpatient Prescriptions  Medication Sig Dispense Refill  . amLODipine (NORVASC) 2.5 MG tablet Take 2.5 mg by mouth daily.  2  . aspirin EC 81 MG tablet Take by mouth.    . clopidogrel (PLAVIX) 75 MG tablet Take 1 tablet (75 mg total) by mouth daily. 30 tablet 11  . dorzolamide (TRUSOPT) 2 % ophthalmic solution INSTILL ONE DROP TO BOTH EYES TWICE DAILY    . dorzolamide-timolol (COSOPT) 22.3-6.8 MG/ML ophthalmic solution INSTILL ONE DROP TWICE DAILY TO BOTH EYES  2  . latanoprost (XALATAN) 0.005 % ophthalmic solution USE 1 DROP IN THE RIGHT EYE AT BEDTIME AS DIRECTED  2  . losartan (COZAAR) 25 MG tablet Take 1 tablet (25 mg total) by mouth daily. 30 tablet 2  . LYRICA 75 MG capsule Take 75 mg by mouth 3 (three) times daily.  0  . Melatonin 10 MG TABS Take 1 tablet by mouth at bedtime.    . Multiple Vitamin (MULTIVITAMIN) tablet Take 1 tablet by mouth daily.    . rosuvastatin (CRESTOR) 5 MG tablet Take 1 tablet (5 mg total) by mouth at bedtime. 30 tablet 2  . meloxicam (MOBIC) 7.5 MG tablet Take by mouth.    [name] omeprazole (PRILOSEC) 20 MG capsule Take 20 mg by mouth daily.     No current facility-administered medications for this visit.     Past Medical History:  Diagnosis Date  . AAA (abdominal aortic aneurysm) ([place])   .  Arthritis   . Glaucoma   . Hypertension   . Stroke [facility])     Past Surgical History:  Procedure Laterality Date  . EYE SURGERY    . neck fusion    . TUMOR REMOVAL      Social History Social History  Substance Use Topics  . Smoking status: Former Research scientist (life sciences)  . Smokeless tobacco: Never Used  . Alcohol use Yes     Comment: occasionally      Family History Family History  Problem Relation Age of Onset  . Stroke Mother   . Stroke Father   . Cancer Brother      Allergies  Allergen Reactions  . Shellfish-Derived Products Nausea Only    Scallops     REVIEW OF SYSTEMS (Negative unless checked)  Constitutional: _0 Weight loss  _1 Fever  _2 Chills Cardiac: _3 Chest pain   _4 Chest pressure   _5 Palpitations   _6 Shortness of breath when laying flat   _7 Shortness of breath at rest   _8 Shortness of breath with exertion. Vascular:  _9 Pain in legs with walking   _10 Pain in legs at rest   _11 Pain in legs when laying flat   _12 Claudication   _13 Pain in feet when walking  _14 Pain in feet at rest  _15 Pain in feet when laying  flat   _0 History of DVT   _1 Phlebitis   _2 Swelling in legs   _3 Varicose veins   _4 Non-healing ulcers Pulmonary:   _5 Uses home oxygen   _6 Productive cough   _7 Hemoptysis   _8 Wheeze  _9 COPD   _10 Asthma Neurologic:  _11 Dizziness  _12 Blackouts   _13 Seizures   _14 History of stroke   _15 History of TIA  _16 Aphasia   _17 Temporary blindness   _18 Dysphagia   _19 Weakness or numbness in arms   _20 Weakness or numbness in legs Musculoskeletal:  _21 Arthritis   _22 Joint swelling   _23 Joint pain   _24 Low back pain Hematologic:  _25 Easy bruising  _26 Easy bleeding   _27 Hypercoagulable state   _28 Anemic   Gastrointestinal:  _29 Blood in stool   _30 Vomiting blood  _31 Gastroesophageal reflux/heartburn   _32 Abdominal pain Genitourinary:  _33 Chronic kidney disease   _34 Difficult urination  _35 Frequent urination  _36 Burning with urination   _37 Hematuria Skin:  _38 Rashes   _39 Ulcers   _40 Wounds Psychological:  _41 History of anxiety   _42   History of major depression.  Physical Examination  BP 124/66 (BP Location: Right Arm)   Pulse (!) 52   Resp 15   Ht _43  (1.753 m)   Wt 69.9 kg (154 lb)   BMI 22.74 kg/m  Gen:  WD/WN, NAD Head: [place]/AT, No temporalis wasting. Ear/Nose/Throat: Hearing grossly intact, nares w/o erythema or drainage, trachea midline Eyes: Conjunctiva clear. Sclera non-icteric Neck: Supple.  No JVD.  Pulmonary:  Good air movement, no use of accessory muscles.  Cardiac: RRR Vascular:  Vessel Right Left  Radial Palpable Palpable                          PT Palpable  not palpable  DP  1+ palpable Palpable   Gastrointestinal: soft, non-tender/non-distended.  Increased aortic impulse is present Musculoskeletal: Uses a walker. no deformity or atrophy.  No significant lower extremity edema. Neurologic: Sensation grossly intact in extremities.  Symmetrical.  Speech is fluent.  Psychiatric: Judgment intact, Mood & affect appropriate for pt's clinical situation. Dermatologic: No rashes or ulcers noted.  No cellulitis or open wounds.       Labs Recent Results (from the past 2160 hour(s))  Protime-INR     Status: None   Collection Time: 10/15/16  9:19 AM  Result Value Ref Range   Prothrombin Time 13.6 11.4 - 15.2 seconds   INR 1.04   APTT     Status: None   Collection Time: 10/15/16  9:19 AM  Result Value Ref Range   aPTT 28 24 - 36 seconds  CBC     Status: Abnormal   Collection Time: 10/15/16  9:19 AM  Result Value Ref Range   WBC 9.1 3.8 - 10.6 K/uL   RBC 5.07 4.40 - 5.90 MIL/uL   Hemoglobin 14.5 13.0 - 18.0 g/dL   HCT 44.5 40.0 - 52.0 %   MCV 87.7 80.0 - 100.0 fL   MCH 28.6 26.0 - 34.0 pg   MCHC 32.6 32.0 - 36.0 g/dL   RDW 15.5 (H) 11.5 - 14.5 %   Platelets 231 150 - 440 K/uL  Differential     Status: Abnormal   Collection Time: 10/15/16  9:19 AM  Result Value Ref Range   Neutrophils Relative % 63 %   Neutro Abs 5.7 1.4 - 6.5 K/uL   Lymphocytes Relative 22 %   Lymphs Abs 2.0  1.0 - 3.6 K/uL   Monocytes Relative 12 %   Monocytes Absolute 1.1 (H) 0.2 -  1.0 K/uL   Eosinophils Relative 2 %   Eosinophils Absolute 0.1 0 - 0.7 K/uL   Basophils Relative 1 %   Basophils Absolute 0.1 0 - 0.1 K/uL  Comprehensive metabolic panel     Status: Abnormal   Collection Time: 10/15/16  9:19 AM  Result Value Ref Range   Sodium 144 135 - 145 mmol/L   Potassium 4.1 3.5 - 5.1 mmol/L   Chloride 110 101 - 111 mmol/L   CO2 27 22 - 32 mmol/L   Glucose, Bld 106 (H) 65 - 99 mg/dL   BUN 20 6 - 20 mg/dL   Creatinine, Ser 1.16 0.61 - 1.24 mg/dL   Calcium 9.4 8.9 - 10.3 mg/dL   Total Protein 6.6 6.5 - 8.1 g/dL   Albumin 3.8 3.5 - 5.0 g/dL   AST 24 15 - 41 U/L   ALT 13 (L) 17 - 63 U/L   Alkaline Phosphatase 54 38 - 126 U/L   Total Bilirubin 0.9 0.3 - 1.2 mg/dL   GFR calc non Af Amer 54 (L) >60 mL/min   GFR calc Af Amer >60 >60 mL/min    Comment: (NOTE) The eGFR has been calculated using the CKD EPI equation. This calculation has not been validated in all clinical situations. eGFR's persistently <60 mL/min signify possible Chronic Kidney Disease.    Anion gap 7 5 - 15  Troponin I     Status: None   Collection Time: 10/15/16  9:19 AM  Result Value Ref Range   Troponin I <0.03 <0.03 ng/mL  Lipid panel     Status: Abnormal   Collection Time: 10/15/16  9:19 AM  Result Value Ref Range   Cholesterol 184 0 - 200 mg/dL   Triglycerides 201 (H) <150 mg/dL   HDL 46 >40 mg/dL   Total CHOL/HDL Ratio 4.0 RATIO   VLDL 40 0 - 40 mg/dL   LDL Cholesterol 98 0 - 99 mg/dL    Comment:        Total Cholesterol/HDL:CHD Risk Coronary Heart Disease Risk Table                     Men   Women  1/2 Average Risk   3.4   3.3  Average Risk       5.0   4.4  2 X Average Risk   9.6   7.1  3 X Average Risk  23.4   11.0        Use the calculated Patient Ratio above and the CHD Risk Table to determine the patient's CHD Risk.        ATP III CLASSIFICATION (LDL):  <100     mg/dL   Optimal  100-129   mg/dL   Near or Above                    Optimal  130-159  mg/dL   Borderline  160-189  mg/dL   High  >190     mg/dL   Very High   Glucose, capillary     Status: None   Collection Time: 10/15/16  9:21 AM  Result Value Ref Range   Glucose-Capillary 77 65 - 99 mg/dL  Protime-INR     Status: None   Collection Time: 10/16/16  1:56 PM  Result Value Ref Range   Prothrombin Time 12.2 11.4 - 15.2 seconds   INR 0.91   APTT     Status: None   Collection Time: 10/16/16  1:56 PM  Result Value Ref Range   aPTT 31 24 - 36 seconds  CBC     Status: Abnormal   Collection Time: 10/16/16  1:56 PM  Result Value Ref Range   WBC 9.2 3.8 - 10.6 K/uL   RBC 5.37 4.40 - 5.90 MIL/uL   Hemoglobin 15.4 13.0 - 18.0 g/dL   HCT 46.6 40.0 - 52.0 %   MCV 86.8 80.0 - 100.0 fL   MCH 28.7 26.0 - 34.0 pg   MCHC 33.1 32.0 - 36.0 g/dL   RDW 15.4 (H) 11.5 - 14.5 %   Platelets 234 150 - 440 K/uL  Differential     Status: None   Collection Time: 10/16/16  1:56 PM  Result Value Ref Range   Neutrophils Relative % 70 %   Neutro Abs 6.5 1.4 - 6.5 K/uL   Lymphocytes Relative 18 %   Lymphs Abs 1.7 1.0 - 3.6 K/uL   Monocytes Relative 9 %   Monocytes Absolute 0.8 0.2 - 1.0 K/uL   Eosinophils Relative 2 %   Eosinophils Absolute 0.1 0 - 0.7 K/uL   Basophils Relative 1 %   Basophils Absolute 0.1 0 - 0.1 K/uL  Comprehensive metabolic panel     Status: Abnormal   Collection Time: 10/16/16  1:56 PM  Result Value Ref Range   Sodium 141 135 - 145 mmol/L   Potassium 4.3 3.5 - 5.1 mmol/L   Chloride 108 101 - 111 mmol/L   CO2 26 22 - 32 mmol/L   Glucose, Bld 122 (H) 65 - 99 mg/dL   BUN 17 6 - 20 mg/dL   Creatinine, Ser 1.39 (H) 0.61 - 1.24 mg/dL   Calcium 9.5 8.9 - 10.3 mg/dL   Total Protein 6.8 6.5 - 8.1 g/dL   Albumin 3.8 3.5 - 5.0 g/dL   AST 23 15 - 41 U/L   ALT 13 (L) 17 - 63 U/L   Alkaline Phosphatase 56 38 - 126 U/L   Total Bilirubin 1.1 0.3 - 1.2 mg/dL   GFR calc non Af Amer 44 (L) >60 mL/min   GFR calc Af  Amer 51 (L) >60 mL/min    Comment: (NOTE) The eGFR has been calculated using the CKD EPI equation. This calculation has not been validated in all clinical situations. eGFR's persistently <60 mL/min signify possible Chronic Kidney Disease.    Anion gap 7 5 - 15  Troponin I     Status: None   Collection Time: 10/16/16  1:56 PM  Result Value Ref Range   Troponin I <0.03 <0.03 ng/mL  Glucose, capillary     Status: Abnormal   Collection Time: 10/16/16  2:20 PM  Result Value Ref Range   Glucose-Capillary 117 (H) 65 - 99 mg/dL  Lipid panel     Status: Abnormal   Collection Time: 10/17/16  5:43 AM  Result Value Ref Range   Cholesterol 179 0 - 200 mg/dL   Triglycerides 161 (H) <150 mg/dL   HDL 42 >40 mg/dL   Total CHOL/HDL Ratio 4.3 RATIO   VLDL 32 0 - 40 mg/dL   LDL Cholesterol 105 (H) 0 - 99 mg/dL    Comment:        Total Cholesterol/HDL:CHD Risk Coronary Heart Disease Risk Table                     Men   Women  1/2 Average Risk   3.4   3.3  Average Risk  5.0   4.4  2 X Average Risk   9.6   7.1  3 X Average Risk  23.4   11.0        Use the calculated Patient Ratio above and the CHD Risk Table to determine the patient's CHD Risk.        ATP III CLASSIFICATION (LDL):  <100     mg/dL   Optimal  100-129  mg/dL   Near or Above                    Optimal  130-159  mg/dL   Borderline  160-189  mg/dL   High  >190     mg/dL   Very High   ECHOCARDIOGRAM COMPLETE     Status: None   Collection Time: 10/17/16 10:17 AM  Result Value Ref Range   BP 161/87 mmHg  Glucose, capillary     Status: Abnormal   Collection Time: 10/17/16  4:44 PM  Result Value Ref Range   Glucose-Capillary 105 (H) 65 - 99 mg/dL  Basic metabolic panel     Status: Abnormal   Collection Time: 10/18/16  5:48 AM  Result Value Ref Range   Sodium 141 135 - 145 mmol/L   Potassium 3.7 3.5 - 5.1 mmol/L   Chloride 108 101 - 111 mmol/L   CO2 25 22 - 32 mmol/L   Glucose, Bld 87 65 - 99 mg/dL   BUN 14 6 - 20  mg/dL   Creatinine, Ser 1.10 0.61 - 1.24 mg/dL   Calcium 8.9 8.9 - 10.3 mg/dL   GFR calc non Af Amer 58 (L) >60 mL/min   GFR calc Af Amer >60 >60 mL/min    Comment: (NOTE) The eGFR has been calculated using the CKD EPI equation. This calculation has not been validated in all clinical situations. eGFR's persistently <60 mL/min signify possible Chronic Kidney Disease.    Anion gap 8 5 - 15    Radiology No results found.    Assessment/Plan Essential hypertension, benign blood pressure control important in reducing the progression of atherosclerotic disease and aneurysmal degeneration. On appropriate oral medications.   Stroke [facility]) Remote, no recent symptoms.  AAA (abdominal aortic aneurysm) without rupture (HCC) His aortic duplex today shows no change in his abdominal aortic aneurysm measuring 4.50 cm in maximal diameter.  This is the same as it was on his CT scan from 3-4 months ago. No surgery or intervention at this time. The patient has an asymptomatic abdominal aortic aneurysm that is greater than 4 cm but less than 5 cm in maximal diameter.  I have discussed the natural history of abdominal aortic aneurysm and the small risk of rupture for aneurysm less than 5 cm in size.  However, as these small aneurysms tend to enlarge over time, continued surveillance with ultrasound or CT scan is mandatory.  I have also discussed optimizing medical management with hypertension and lipid control and the importance of abstinence from tobacco.  The patient is also encouraged to exercise a minimum of 30 minutes 4 times a week.  Should the patient develop new onset abdominal or back pain or signs of peripheral embolization they are instructed to seek medical attention immediately and to alert the physician providing care that they have an aneurysm.  The patient voices their understanding. I have scheduled the patient to return in 6 months with an aortic duplex.    Leotis Pain,  MD  12/22/2016 11:09 AM    This note  was created with Dragon medical transcription system.  Any errors from dictation are purely unintentional

## 2016-12-22 NOTE — Patient Instructions (Signed)
Abdominal Aortic Aneurysm Blood pumps away from the heart through tubes (blood vessels) called arteries. Aneurysms are weak or damaged places in the wall of an artery. It bulges out like a balloon. An abdominal aortic aneurysm happens in the main artery of the body (aorta). It can burst or tear, causing bleeding inside the body. This is an emergency. It needs treatment right away. What are the causes? The exact cause is unknown. Things that could cause this problem include:  Fat and other substances building up in the lining of a tube.  Swelling of the walls of a blood vessel.  Certain tissue diseases.  Belly (abdominal) trauma.  An infection in the main artery of the body.  What increases the risk? There are things that make it more likely for you to have an aneurysm. These include:  Being over the age of 81 years old.  Having high blood pressure (hypertension).  Being a male.  Being white.  Being very overweight (obese).  Having a family history of aneurysm.  Using tobacco products.  What are the signs or symptoms? Symptoms depend on the size of the aneurysm and how fast it grows. There may not be symptoms. If symptoms occur, they can include:  Pain (belly, side, lower back, or groin).  Feeling full after eating a small amount of food.  Feeling sick to your stomach (nauseous), throwing up (vomiting), or both.  Feeling a lump in your belly that feels like it is beating (pulsating).  Feeling like you will pass out (faint).  How is this treated?  Medicine to control blood pressure and pain.  Imaging tests to see if the aneurysm gets bigger.  Surgery. How is this prevented? To lessen your chance of getting this condition:  Stop smoking. Stop chewing tobacco.  Limit or avoid alcohol.  Keep your blood pressure, blood sugar, and cholesterol within normal limits.  Eat less salt.  Eat foods low in saturated fats and cholesterol. These are found in animal and  whole dairy products.  Eat more fiber. Fiber is found in whole grains, vegetables, and fruits.  Keep a healthy weight.  Stay active and exercise often.  This information is not intended to replace advice given to you by your health care provider. Make sure you discuss any questions you have with your health care provider. Document Released: 06/20/2012 Document Revised: 08/01/2015 Document Reviewed: 03/25/2012 Elsevier Interactive Patient Education  2017 [employer] Inc.  

## 2016-12-22 NOTE — Assessment & Plan Note (Signed)
His aortic duplex today shows no change in his abdominal aortic aneurysm measuring 4.50 cm in maximal diameter.  This is the same as it was on his CT scan from 3-4 months ago. No surgery or intervention at this time. The patient has an asymptomatic abdominal aortic aneurysm that is greater than 4 cm but less than 5 cm in maximal diameter.  I have discussed the natural history of abdominal aortic aneurysm and the small risk of rupture for aneurysm less than 5 cm in size.  However, as these small aneurysms tend to enlarge over time, continued surveillance with ultrasound or CT scan is mandatory.  I have also discussed optimizing medical management with hypertension and lipid control and the importance of abstinence from tobacco.  The patient is also encouraged to exercise a minimum of 30 minutes 4 times a week.  Should the patient develop new onset abdominal or back pain or signs of peripheral embolization they are instructed to seek medical attention immediately and to alert the physician providing care that they have an aneurysm.  The patient voices their understanding. I have scheduled the patient to return in 6 months with an aortic duplex.

## 2017-02-01 ENCOUNTER — Emergency Department: Payer: Medicare Other

## 2017-02-01 ENCOUNTER — Encounter: Payer: Self-pay | Admitting: Intensive Care

## 2017-02-01 ENCOUNTER — Other Ambulatory Visit: Payer: Self-pay

## 2017-02-01 ENCOUNTER — Emergency Department
Admission: EM | Admit: 2017-02-01 | Discharge: 2017-02-01 | Disposition: A | Discharge status: Home / Self Care | Payer: Medicare Other | Attending: Student in an Organized Health Care Education/Training Program | Admitting: Student in an Organized Health Care Education/Training Program

## 2017-02-01 DIAGNOSIS — Z87891 Personal history of nicotine dependence: Secondary | ICD-10-CM | POA: Insufficient documentation

## 2017-02-01 DIAGNOSIS — Z7982 Long term (current) use of aspirin: Secondary | ICD-10-CM | POA: Diagnosis not present

## 2017-02-01 DIAGNOSIS — Z79899 Other long term (current) drug therapy: Secondary | ICD-10-CM | POA: Insufficient documentation

## 2017-02-01 DIAGNOSIS — S0990XA Unspecified injury of head, initial encounter: Secondary | ICD-10-CM

## 2017-02-01 DIAGNOSIS — R51 Headache: Secondary | ICD-10-CM | POA: Diagnosis not present

## 2017-02-01 DIAGNOSIS — I10 Essential (primary) hypertension: Secondary | ICD-10-CM | POA: Insufficient documentation

## 2017-02-01 DIAGNOSIS — Y929 Unspecified place or not applicable: Secondary | ICD-10-CM | POA: Diagnosis not present

## 2017-02-01 DIAGNOSIS — Y9301 Activity, walking, marching and hiking: Secondary | ICD-10-CM | POA: Insufficient documentation

## 2017-02-01 DIAGNOSIS — R2689 Other abnormalities of gait and mobility: Secondary | ICD-10-CM | POA: Diagnosis not present

## 2017-02-01 DIAGNOSIS — Y999 Unspecified external cause status: Secondary | ICD-10-CM | POA: Diagnosis not present

## 2017-02-01 DIAGNOSIS — Z8673 Personal history of transient ischemic attack (TIA), and cerebral infarction without residual deficits: Secondary | ICD-10-CM | POA: Insufficient documentation

## 2017-02-01 DIAGNOSIS — W0110XA Fall on same level from slipping, tripping and stumbling with subsequent striking against unspecified object, initial encounter: Secondary | ICD-10-CM | POA: Diagnosis not present

## 2017-02-01 LAB — CBC
HCT: 42.1 % (ref 40.0–52.0)
Hemoglobin: 14 g/dL (ref 13.0–18.0)
MCH: 29.1 pg (ref 26.0–34.0)
MCHC: 33.2 g/dL (ref 32.0–36.0)
MCV: 87.8 fL (ref 80.0–100.0)
Platelets: 168 10*3/uL (ref 150–440)
RBC: 4.8 MIL/uL (ref 4.40–5.90)
RDW: 15.3 % — ABNORMAL HIGH (ref 11.5–14.5)
WBC: 8.5 10*3/uL (ref 3.8–10.6)

## 2017-02-01 LAB — PROTIME-INR
INR: 0.99
Prothrombin Time: 13 seconds (ref 11.4–15.2)

## 2017-02-01 LAB — COMPREHENSIVE METABOLIC PANEL
ALT: 15 U/L — ABNORMAL LOW (ref 17–63)
AST: 23 U/L (ref 15–41)
Albumin: 4 g/dL (ref 3.5–5.0)
Alkaline Phosphatase: 57 U/L (ref 38–126)
Anion gap: 8 (ref 5–15)
BUN: 27 mg/dL — ABNORMAL HIGH (ref 6–20)
CO2: 25 mmol/L (ref 22–32)
Calcium: 9.2 mg/dL (ref 8.9–10.3)
Chloride: 111 mmol/L (ref 101–111)
Creatinine, Ser: 1.18 mg/dL (ref 0.61–1.24)
GFR calc Af Amer: >60 mL/min (ref >60)
GFR calc non Af Amer: 53 mL/min — ABNORMAL LOW (ref >60)
Glucose, Bld: 107 mg/dL — ABNORMAL HIGH (ref 65–99)
Potassium: 4.1 mmol/L (ref 3.5–5.1)
Sodium: 144 mmol/L (ref 135–145)
Total Bilirubin: 1.1 mg/dL (ref 0.3–1.2)
Total Protein: 7.4 g/dL (ref 6.5–8.1)

## 2017-02-01 LAB — DIFFERENTIAL
Basophils Absolute: 0.1 10*3/uL (ref 0–0.1)
Basophils Relative: 1 %
Eosinophils Absolute: 0.3 10*3/uL (ref 0–0.7)
Eosinophils Relative: 3 %
Lymphocytes Relative: 21 %
Lymphs Abs: 1.8 10*3/uL (ref 1.0–3.6)
Monocytes Absolute: 1 10*3/uL (ref 0.2–1.0)
Monocytes Relative: 12 %
Neutro Abs: 5.4 10*3/uL (ref 1.4–6.5)
Neutrophils Relative %: 63 %

## 2017-02-01 LAB — TROPONIN I: Troponin I: <0.03 ng/mL (ref <0.03)

## 2017-02-01 LAB — APTT: aPTT: 27 seconds (ref 24–36)

## 2017-02-01 NOTE — ED Triage Notes (Addendum)
Patient reports falling on 11/24 and hitting his head on cabinets and tub in bathroom. Patient reports having LOC after hitting head but was not evaluated by doctor. Takes plavix daily and 81mg  apsirin daily. Patient is unsure what caused him to fall. A&O x4 in triage. 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 Speech clear. Left hand grip stronger but patient reports that's normal due to operation on R hand years ago. Denies numbness. Patient c/o of aphasia since falling.

## 2017-02-01 NOTE — ED Notes (Signed)
Patient transported to CT 

## 2017-02-01 NOTE — ED Provider Notes (Signed)
[facility] Emergency Department Provider Note    First MD Initiated Contact with Patient 02/01/17 1259     (approximate)  I have reviewed the triage vital signs and the nursing notes.   HISTORY  Chief Complaint No chief complaint on file.    HPI ROMONE Bryant is a 83 y.o. male with a history of stroke in the past 6 months currently on anti-platelet therapy presents with increasing balance issues of the past 2 months with one fall on Saturday where he did hit his head did not lose consciousness.  This occurred while he was down in [place].  He did not seek medical care.  When he got back to the [place] he touch base with his primary care physician who recommended he come to the ER for evaluation of his head injury.  Currently denies any pain.  No chest pain or shortness of breath.  No abdominal pain.  No new numbness or tingling.  Does have a mild headache when he bends forward.  No neck pain.  No change in behavior.  Did receive physical therapy after being admitted to the hospital for his stroke previously but did not have any specific gait training or balance training performed.  Past Medical History:  Diagnosis Date  . AAA (abdominal aortic aneurysm) ([place])   . Arthritis   . Glaucoma   . Hypertension   . Stroke [facility])    Family History  Problem Relation Age of Onset  . Stroke Mother   . Stroke Father   . Cancer Brother    Past Surgical History:  Procedure Laterality Date  . EYE SURGERY    . neck fusion    . TUMOR REMOVAL     Patient Active Problem List   Diagnosis Date Noted  . CVA (cerebral vascular accident) ([place]) 10/15/2016  . Essential hypertension, benign 09/18/2016  . Stroke ([place]) 09/18/2016  . AAA (abdominal aortic aneurysm) without rupture ([place]) 09/18/2016      Prior to Admission medications   Medication Sig Start Date End Date Taking? Authorizing Provider  amLODipine (NORVASC) 2.5 MG tablet Take 2.5 mg by mouth daily.  10/06/16   [provider]  aspirin EC 81 MG tablet Take by mouth.    [provider]  clopidogrel (PLAVIX) 75 MG tablet Take 1 tablet (75 mg total) by mouth daily. 10/16/16 10/16/17  Gladstone Lighter, MD  dorzolamide (TRUSOPT) 2 % ophthalmic solution INSTILL ONE DROP TO BOTH EYES TWICE DAILY 10/18/14   [provider]  dorzolamide-timolol (COSOPT) 22.3-6.8 MG/ML ophthalmic solution INSTILL ONE DROP TWICE DAILY TO BOTH EYES 08/10/16   [provider]  latanoprost (XALATAN) 0.005 % ophthalmic solution USE 1 DROP IN THE RIGHT EYE AT BEDTIME AS DIRECTED 09/03/16   [provider]  losartan (COZAAR) 25 MG tablet Take 1 tablet (25 mg total) by mouth daily. 10/16/16   Gladstone Lighter, MD  LYRICA 75 MG capsule Take 75 mg by mouth 3 (three) times daily. 07/10/16   [provider]  Melatonin 10 MG TABS Take 1 tablet by mouth at bedtime.    [provider]  meloxicam (MOBIC) 7.5 MG tablet Take by mouth. 07/07/16 07/07/17  [provider]  Multiple Vitamin (MULTIVITAMIN) tablet Take 1 tablet by mouth daily.    [provider]  omeprazole (PRILOSEC) 20 MG capsule Take 20 mg by mouth daily.    [provider]  rosuvastatin (CRESTOR) 5 MG tablet Take 1 tablet (5 mg total) by  mouth at bedtime. 10/16/16 10/16/17  Gladstone Lighter, MD    Allergies Shellfish-derived products    Social History Social History   Tobacco Use  . Smoking status: Former Research scientist (life sciences)  . Smokeless tobacco: Never Used  Substance Use Topics  . Alcohol use: Yes    Comment: occasionally  . Drug use: No    Review of Systems Patient denies headaches, rhinorrhea, blurry vision, numbness, shortness of breath, chest pain, edema, cough, abdominal pain, nausea, vomiting, diarrhea, dysuria, fevers, rashes or hallucinations unless otherwise stated above in HPI. ____________________________________________   PHYSICAL EXAM:  VITAL SIGNS: Vitals:   02/01/17  1121 02/01/17 1347  BP: (!) 154/88 (!) 144/69  Pulse: (!) 58 62  Resp: 16 20  Temp: 98 F (36.7 C) 97.9 F (36.6 C)  SpO2: 98% 98%    Constitutional: Alert and oriented. Well appearing and in no acute distress. Eyes: Conjunctivae are normal.  Head: Atraumatic. Nose: No congestion/rhinnorhea. Mouth/Throat: Mucous membranes are moist.   Neck: No stridor. Painless ROM.  Cardiovascular: Normal rate, regular rhythm. Grossly normal heart sounds.  Good peripheral circulation. Respiratory: Normal respiratory effort.  No retractions. Lungs CTAB. Gastrointestinal: Soft and nontender. No distention. No abdominal bruits. No CVA tenderness. Genitourinary:  Musculoskeletal: No lower extremity tenderness nor edema.  No joint effusions. Neurologic:  Normal speech and language. No gross focal neurologic deficits are appreciated. Trace weakness of left face (chronic) no significant coordination deficit.  Strength is equal bilaterally. Skin:  Skin is warm, dry and intact. No rash noted. Psychiatric: Mood and affect are normal. Speech and behavior are normal.  ____________________________________________   LABS (all labs ordered are listed, but only abnormal results are displayed)  Results for orders placed or performed during the hospital encounter of 02/01/17 (from the past 24 hour(s))  Protime-INR     Status: None   Collection Time: 02/01/17 11:33 AM  Result Value Ref Range   Prothrombin Time 13.0 11.4 - 15.2 seconds   INR 0.99   APTT     Status: None   Collection Time: 02/01/17 11:33 AM  Result Value Ref Range   aPTT 27 24 - 36 seconds  CBC     Status: Abnormal   Collection Time: 02/01/17 11:33 AM  Result Value Ref Range   WBC 8.5 3.8 - 10.6 K/uL   RBC 4.80 4.40 - 5.90 MIL/uL   Hemoglobin 14.0 13.0 - 18.0 g/dL   HCT 42.1 40.0 - 52.0 %   MCV 87.8 80.0 - 100.0 fL   MCH 29.1 26.0 - 34.0 pg   MCHC 33.2 32.0 - 36.0 g/dL   RDW 15.3 (H) 11.5 - 14.5 %   Platelets 168 150 - 440 K/uL    Differential     Status: None   Collection Time: 02/01/17 11:33 AM  Result Value Ref Range   Neutrophils Relative % 63 %   Neutro Abs 5.4 1.4 - 6.5 K/uL   Lymphocytes Relative 21 %   Lymphs Abs 1.8 1.0 - 3.6 K/uL   Monocytes Relative 12 %   Monocytes Absolute 1.0 0.2 - 1.0 K/uL   Eosinophils Relative 3 %   Eosinophils Absolute 0.3 0 - 0.7 K/uL   Basophils Relative 1 %   Basophils Absolute 0.1 0 - 0.1 K/uL  Comprehensive metabolic panel     Status: Abnormal   Collection Time: 02/01/17 11:33 AM  Result Value Ref Range   Sodium 144 135 - 145 mmol/L   Potassium 4.1 3.5 - 5.1 mmol/L   Chloride  111 101 - 111 mmol/L   CO2 25 22 - 32 mmol/L   Glucose, Bld 107 (H) 65 - 99 mg/dL   BUN 27 (H) 6 - 20 mg/dL   Creatinine, Ser 1.18 0.61 - 1.24 mg/dL   Calcium 9.2 8.9 - 10.3 mg/dL   Total Protein 7.4 6.5 - 8.1 g/dL   Albumin 4.0 3.5 - 5.0 g/dL   AST 23 15 - 41 U/L   ALT 15 (L) 17 - 63 U/L   Alkaline Phosphatase 57 38 - 126 U/L   Total Bilirubin 1.1 0.3 - 1.2 mg/dL   GFR calc non Af Amer 53 (L) >60 mL/min   GFR calc Af Amer >60 >60 mL/min   Anion gap 8 5 - 15  Troponin I     Status: None   Collection Time: 02/01/17 11:33 AM  Result Value Ref Range   Troponin I <0.03 <0.03 ng/mL   ____________________________________________  EKG My review and personal interpretation at Time: 11:28   Indication: fall  Rate: 60  Rhythm: sinus Axis: normal Other: normal intervals, nostemi ____________________________________________  RADIOLOGY  I personally reviewed all radiographic images ordered to evaluate for the above acute complaints and reviewed radiology reports and findings.  These findings were personally discussed with the patient.  Please see medical record for radiology report.  ____________________________________________   PROCEDURES  Procedure(s) performed:  Procedures    Critical Care performed: no ____________________________________________   INITIAL IMPRESSION /  ASSESSMENT AND PLAN / ED COURSE  Pertinent labs & imaging results that were available during my care of the patient were reviewed by me and considered in my medical decision making (see chart for details).  DDX: sah, sdh, edh, fracture, contusion, soft tissue injury, viscous injury, concussion, hemorrhage   Steve Bryant is a 83 y.o. who presents to the ED with several falls status post recent stroke and discharge after skilled rehab presents for evaluation after head injury that he sustained on Saturday while traveling in [place].  He is afebrile hemodynamically stable.  He has no objective focal neuro deficits at this time.  Does have contusions as described above for which CT imaging was obtained to evaluate for intracranial abnormality as the patient is on dual antiplatelet therapy.  There is no evidence of acute traumatic intracranial abnormality.  Symptoms seem to have been ongoing for 2 months.  This does not seem to be consistent with new neuro deficit or new balance issue to suggest posterior circulation stroke.  Blood work is otherwise reassuring.  No evidence of infection.  Patient will be given referral to physical therapy as this seems to be primarily physical deconditioning and difficulty with ambulation.  Have discussed with the patient and available family all diagnostics and treatments performed thus far and all questions were answered to the best of my ability. The patient demonstrates understanding and agreement with plan.       ____________________________________________   FINAL CLINICAL IMPRESSION(S) / ED DIAGNOSES  Final diagnoses:  Minor head injury, initial encounter  Balance problems      NEW MEDICATIONS STARTED DURING THIS VISIT:  This SmartLink is deprecated. Use AVSMEDLIST instead to display the medication list for a patient.   Note:  This document was prepared using Dragon voice recognition software and may include unintentional dictation errors.      Merlyn Lot, MD 02/01/17 [phone]

## 2017-02-01 NOTE — Discharge Instructions (Signed)

## 2017-02-01 NOTE — Care Management Note (Signed)
Case Management Note  Patient Details  Name: Steve Bryant MRN: [id number] Date of Birth: [birth year]  Subjective/Objective:     Saw patient and family in ER room. They were given a list of approved agencies to choose from and automatically asked for [facility] and [place] , one of their PT professionals. I called Corene Cornea with [facility] and relayed the request for PT, and Lyn and he accepted the referral    , and will try to accomodate the request for Lyn.Family and MD made aware of referral.  MD completing the face to face.           Action/Plan:   Expected Discharge Date:                  Expected Discharge Plan:     In-House Referral:     Discharge planning Services     Post Acute Care Choice:    Choice offered to:     DME Arranged:    DME Agency:     HH Arranged:    HH Agency:     Status of Service:     If discussed at [employer] of Stay Meetings, dates discussed:    Additional Comments:  Beau Fanny, RN 02/01/2017, 1:33 PM

## 2017-02-01 NOTE — ED Notes (Signed)
Pt to POV in wheelchair with family. NAD. VSS. Respirations easy and equal. He denies any pain reports that he feels good. No questions or concerns voiced during discharge instructions.

## 2017-03-05 ENCOUNTER — Other Ambulatory Visit: Payer: Self-pay

## 2017-03-05 ENCOUNTER — Encounter: Payer: Self-pay | Admitting: Emergency Medicine

## 2017-03-05 ENCOUNTER — Emergency Department
Admission: EM | Admit: 2017-03-05 | Discharge: 2017-03-05 | Disposition: A | Discharge status: Home / Self Care | Payer: Medicare Other | Attending: Emergency Medicine | Admitting: Emergency Medicine

## 2017-03-05 DIAGNOSIS — Z7902 Long term (current) use of antithrombotics/antiplatelets: Secondary | ICD-10-CM | POA: Insufficient documentation

## 2017-03-05 DIAGNOSIS — S59912A Unspecified injury of left forearm, initial encounter: Secondary | ICD-10-CM | POA: Diagnosis present

## 2017-03-05 DIAGNOSIS — W109XXA Fall (on) (from) unspecified stairs and steps, initial encounter: Secondary | ICD-10-CM | POA: Diagnosis not present

## 2017-03-05 DIAGNOSIS — Z79899 Other long term (current) drug therapy: Secondary | ICD-10-CM | POA: Insufficient documentation

## 2017-03-05 DIAGNOSIS — Z7982 Long term (current) use of aspirin: Secondary | ICD-10-CM | POA: Insufficient documentation

## 2017-03-05 DIAGNOSIS — Z87891 Personal history of nicotine dependence: Secondary | ICD-10-CM | POA: Diagnosis not present

## 2017-03-05 DIAGNOSIS — Y929 Unspecified place or not applicable: Secondary | ICD-10-CM | POA: Insufficient documentation

## 2017-03-05 DIAGNOSIS — Z791 Long term (current) use of non-steroidal anti-inflammatories (NSAID): Secondary | ICD-10-CM | POA: Diagnosis not present

## 2017-03-05 DIAGNOSIS — Y999 Unspecified external cause status: Secondary | ICD-10-CM | POA: Diagnosis not present

## 2017-03-05 DIAGNOSIS — Z8673 Personal history of transient ischemic attack (TIA), and cerebral infarction without residual deficits: Secondary | ICD-10-CM | POA: Insufficient documentation

## 2017-03-05 DIAGNOSIS — S51811A Laceration without foreign body of right forearm, initial encounter: Secondary | ICD-10-CM | POA: Insufficient documentation

## 2017-03-05 DIAGNOSIS — I10 Essential (primary) hypertension: Secondary | ICD-10-CM | POA: Diagnosis not present

## 2017-03-05 DIAGNOSIS — S51812A Laceration without foreign body of left forearm, initial encounter: Secondary | ICD-10-CM | POA: Diagnosis not present

## 2017-03-05 DIAGNOSIS — Y9301 Activity, walking, marching and hiking: Secondary | ICD-10-CM | POA: Insufficient documentation

## 2017-03-05 NOTE — ED Notes (Signed)
Restore placed on pt skin tears. Gauze placed over restore and wrapped with gauze. Pt tolerated wrapping well.

## 2017-03-05 NOTE — ED Provider Notes (Signed)
[facility] Emergency Department Provider Note  ____________________________________________   First MD Initiated Contact with Patient 03/05/17 1457     (approximate)  I have reviewed the triage vital signs and the nursing notes.   HISTORY  Chief Complaint Fall    HPI Steve Bryant is a 83 y.o. male is here with skin tears to bilateral forearms.patient states he tripped on one step scraping his arms possibly on his walker or the railing. Patient is legally blind and is unsure what he hit his arm on. He denies any head injury or loss of consciousness. He has no other complaints. Family member with the patient states that he has ongoing issues which may be attributed to his sliding on the step. [name]  Patient is on Coumadin but the last INR found in his records was subtherapeutic. Family member also acknowledges that it was low but medication was not changed.   Past Medical History:  Diagnosis Date  . AAA (abdominal aortic aneurysm) ([place])   . Arthritis   . Glaucoma   . Hypertension   . Stroke [facility])     Patient Active Problem List   Diagnosis Date Noted  . CVA (cerebral vascular accident) ([place]) 10/15/2016  . Essential hypertension, benign 09/18/2016  . Stroke ([place]) 09/18/2016  . AAA (abdominal aortic aneurysm) without rupture ([place]) 09/18/2016    Past Surgical History:  Procedure Laterality Date  . EYE SURGERY    . neck fusion    . TUMOR REMOVAL      Prior to Admission medications   Medication Sig Start Date End Date Taking? Authorizing Provider  amLODipine (NORVASC) 2.5 MG tablet Take 2.5 mg by mouth daily. 10/06/16   [provider]  aspirin EC 81 MG tablet Take by mouth.    [provider]  clopidogrel (PLAVIX) 75 MG tablet Take 1 tablet (75 mg total) by mouth daily. 10/16/16 10/16/17  Gladstone Lighter, MD  dorzolamide (TRUSOPT) 2 % ophthalmic  solution INSTILL ONE DROP TO BOTH EYES TWICE DAILY 10/18/14   [provider]  dorzolamide-timolol (COSOPT) 22.3-6.8 MG/ML ophthalmic solution INSTILL ONE DROP TWICE DAILY TO BOTH EYES 08/10/16   [provider]  latanoprost (XALATAN) 0.005 % ophthalmic solution USE 1 DROP IN THE RIGHT EYE AT BEDTIME AS DIRECTED 09/03/16   [provider]  losartan (COZAAR) 25 MG tablet Take 1 tablet (25 mg total) by mouth daily. 10/16/16   Gladstone Lighter, MD  LYRICA 75 MG capsule Take 75 mg by mouth 3 (three) times daily. 07/10/16   [provider]  Melatonin 10 MG TABS Take 1 tablet by mouth at bedtime.    [provider]  meloxicam (MOBIC) 7.5 MG tablet Take by mouth. 07/07/16 07/07/17  [provider]  Multiple Vitamin (MULTIVITAMIN) tablet Take 1 tablet by mouth daily.    [provider]  omeprazole (PRILOSEC) 20 MG capsule Take 20 mg by mouth daily.    [provider]  rosuvastatin (CRESTOR) 5 MG tablet Take 1 tablet (5 mg total) by mouth at bedtime. 10/16/16 10/16/17  Gladstone Lighter, MD    Allergies Shellfish-derived products  Family History  Problem Relation Age of Onset  . Stroke Mother   . Stroke Father   . Cancer Brother     Social History Social History   Tobacco Use  . Smoking status: Former Research scientist (life sciences)  . Smokeless tobacco: Never Used  Substance Use Topics  . Alcohol use:  Yes    Comment: occasionally  . Drug use: No    Review of Systems Constitutional: No fever/chills Eyes: legally blind. ENT: no trauma. Cardiovascular: Denies chest pain. Respiratory: Denies shortness of breath. Gastrointestinal: No abdominal pain.  No nausea, no vomiting.  Musculoskeletal: negative for bilateral arm pain. Skin: positive for skin tears 1 to each arm. Neurological: Negative for headaches, focal weakness or numbness. ____________________________________________   PHYSICAL EXAM:  VITAL SIGNS: ED Triage Vitals [03/05/17 1450]    Enc Vitals Group     BP (!) 171/67     Pulse Rate 60     Resp 18     Temp 97.8 F (36.6 C)     Temp Source Oral     SpO2 100 %     Weight 150 lb (68 kg)     Height 5\' 9"  (1.753 m)     Head Circumference      Peak Flow      Pain Score      Pain Loc      Pain Edu?      Excl. in [place]?    Constitutional: Alert and oriented. Well appearing and in no acute distress. Eyes: Conjunctivae are normal.  Head: Atraumatic. Nose: no trauma. Neck: No stridor.  nontender cervical spine to palpation posteriorly. Cardiovascular: Normal rate, regular rhythm. Grossly normal heart sounds.  Good peripheral circulation. Respiratory: Normal respiratory effort.  No retractions. Lungs CTAB. Musculoskeletal: No lower extremity tenderness nor edema.  No joint effusions. Neurologic:  Normal speech and language. No gross focal neurologic deficits are appreciated. Skin:  Skin is warm, dry.  There is a very thin skin tear on the distal aspect of the left forearm without active bleeding. Right forearm lateral aspect there is a linear skin tear without active bleeding.  No deformity is noted. No foreign body noted. Patient was nontender to palpation of the forearm, wrist, elbow. Range of motion is without pain. No soft tissue swelling is present. Psychiatric: Mood and affect are normal. Speech and behavior are normal.  ____________________________________________   LABS (all labs ordered are listed, but only abnormal results are displayed)  Labs Reviewed - No data to display  RADIOLOGY  deferred ____________________________________________   PROCEDURES  Procedure(s) performed: None  Procedures  Critical Care performed: No  ____________________________________________   INITIAL IMPRESSION / ASSESSMENT AND PLAN / ED COURSE Family members were made aware that the restore dressings needed to stay in place for 72 hours. After that they may apply nonstick dressings to the area and watch for any signs of  infection. Patient may also follow-up with his PCP if any continued problems.   ____________________________________________   FINAL CLINICAL IMPRESSION(S) / ED DIAGNOSES  Final diagnoses:  Skin tear of left forearm without complication, initial encounter  Skin tear of forearm without complication, right, initial encounter     ED Discharge Orders    None       Note:  This document was prepared using Dragon voice recognition software and may include unintentional dictation errors.    Johnn Hai, PA-C 03/05/17 1531    Harvest Dark, MD 03/05/17 2146

## 2017-03-05 NOTE — Discharge Instructions (Signed)
Leave Restore dressing on for 72 hours.  Keep dressing clean and dry.  Replace dressing with non stick dressing in 72 hours and then replace daily.  Skin tears will heal slowly.  Follow up with Dr. Lovie Macadamia if any continued problems.

## 2017-03-05 NOTE — ED Triage Notes (Signed)
Pt is legally blind. Tripped down 1 stair and scraped left forearm.  Skin tear noted with clot where bleeding has stopped. Is on coumadin.  Last lNR was subtherapeutic that RN can see. Did not hit head. No other complaints. No pain to arm.

## 2017-06-28 ENCOUNTER — Other Ambulatory Visit (INDEPENDENT_AMBULATORY_CARE_PROVIDER_SITE_OTHER): Payer: Self-pay | Admitting: Vascular Surgery

## 2017-06-28 DIAGNOSIS — I7090 Unspecified atherosclerosis: Secondary | ICD-10-CM

## 2017-06-29 ENCOUNTER — Ambulatory Visit (INDEPENDENT_AMBULATORY_CARE_PROVIDER_SITE_OTHER): Payer: Medicare Other | Admitting: Vascular Surgery

## 2017-06-29 ENCOUNTER — Encounter (INDEPENDENT_AMBULATORY_CARE_PROVIDER_SITE_OTHER): Payer: Self-pay | Admitting: Vascular Surgery

## 2017-06-29 ENCOUNTER — Ambulatory Visit (INDEPENDENT_AMBULATORY_CARE_PROVIDER_SITE_OTHER): Payer: Medicare Other

## 2017-06-29 ENCOUNTER — Other Ambulatory Visit (INDEPENDENT_AMBULATORY_CARE_PROVIDER_SITE_OTHER): Payer: Self-pay | Admitting: Vascular Surgery

## 2017-06-29 VITALS — BP 124/65 | HR 60 | Resp 16 | Ht 69.0 in | Wt 157.4 lb

## 2017-06-29 DIAGNOSIS — I714 Abdominal aortic aneurysm, without rupture, unspecified: Secondary | ICD-10-CM

## 2017-06-29 DIAGNOSIS — I10 Essential (primary) hypertension: Secondary | ICD-10-CM

## 2017-06-29 DIAGNOSIS — I639 Cerebral infarction, unspecified: Secondary | ICD-10-CM | POA: Diagnosis not present

## 2017-06-29 DIAGNOSIS — I7090 Unspecified atherosclerosis: Secondary | ICD-10-CM

## 2017-06-29 NOTE — Progress Notes (Signed)
MRN : [id number]  Steve Bryant is a 84 y.o. (04-08-1925) male who presents with chief complaint of  Chief Complaint  Patient presents with  . Follow-up    36mo aorta iliac  .  History of Present Illness: Patient returns today in follow up of his abdominal aortic aneurysm.  He is doing well and has no symptoms related to the aneurysm. Specifically, the patient denies new back or abdominal pain, or signs of peripheral embolization. His aortic duplex today shows no change in his abdominal aortic aneurysm measuring 4.50 cm in maximal diameter.  This is the same as it was on his duplex 6 months ago. No surgery or intervention at this time.  Current Outpatient Medications  Medication Sig Dispense Refill  . amLODipine (NORVASC) 2.5 MG tablet Take 2.5 mg by mouth daily.  2  . aspirin EC 81 MG tablet Take by mouth.    . clopidogrel (PLAVIX) 75 MG tablet Take 1 tablet (75 mg total) by mouth daily. 30 tablet 11  . dorzolamide (TRUSOPT) 2 % ophthalmic solution INSTILL ONE DROP TO BOTH EYES TWICE DAILY    . dorzolamide-timolol (COSOPT) 22.3-6.8 MG/ML ophthalmic solution INSTILL ONE DROP TWICE DAILY TO BOTH EYES  2  . latanoprost (XALATAN) 0.005 % ophthalmic solution USE 1 DROP IN THE RIGHT EYE AT BEDTIME AS DIRECTED  2  . losartan (COZAAR) 25 MG tablet Take 1 tablet (25 mg total) by mouth daily. 30 tablet 2  . LYRICA 75 MG capsule Take 75 mg by mouth 3 (three) times daily.  0  . Melatonin 10 MG TABS Take 1 tablet by mouth at bedtime.    . meloxicam (MOBIC) 7.5 MG tablet Take by mouth.    . Multiple Vitamin (MULTIVITAMIN) tablet Take 1 tablet by mouth daily.    [name] omeprazole (PRILOSEC) 20 MG capsule Take 20 mg by mouth daily.    . rosuvastatin (CRESTOR) 5 MG tablet Take 1 tablet (5 mg total) by mouth at bedtime. 30 tablet 2   No current facility-administered medications for this visit.     Past Medical History:  Diagnosis Date  . AAA (abdominal aortic aneurysm) ([place])   . Arthritis   .  Glaucoma   . Hypertension   . Stroke [facility])     Past Surgical History:  Procedure Laterality Date  . EYE SURGERY    . neck fusion    . TUMOR REMOVAL      Social History        Social History   Substance Use Topics   . Smoking status: Former Research scientist (life sciences)   . Smokeless tobacco: Never Used   . Alcohol use Yes     Comment: occasionally       Family History      Family History  Problem Relation Age of Onset  . Stroke Mother   . Stroke Father   . Cancer Brother           Allergies  Allergen Reactions  . Shellfish-Derived Products Nausea Only    Scallops     REVIEW OF SYSTEMS (Negative unless checked)  Constitutional: [] Weight loss  [] Fever  [] Chills Cardiac: [] Chest pain   [] Chest pressure   [] Palpitations   [] Shortness of breath when laying flat   [] Shortness of breath at rest   [] Shortness of breath with exertion. Vascular:  [] Pain in legs with walking   [] Pain in legs at rest   [] Pain in legs when laying flat   [] Claudication   [] Pain  in feet when walking  [] Pain in feet at rest  [] Pain in feet when laying flat   [] History of DVT   [] Phlebitis   [] Swelling in legs   [] Varicose veins   [] Non-healing ulcers Pulmonary:   [] Uses home oxygen   [] Productive cough   [] Hemoptysis   [] Wheeze  [] COPD   [] Asthma Neurologic:  [] Dizziness  [] Blackouts   [] Seizures   [x] History of stroke   [] History of TIA  [] Aphasia   [] Temporary blindness   [] Dysphagia   [] Weakness or numbness in arms   [] Weakness or numbness in legs Musculoskeletal:  [x] Arthritis   [] Joint swelling   [] Joint pain   [] Low back pain Hematologic:  [] Easy bruising  [] Easy bleeding   [] Hypercoagulable state   [] Anemic   Gastrointestinal:  [] Blood in stool   [] Vomiting blood  [x] Gastroesophageal reflux/heartburn   [] Abdominal pain Genitourinary:  [] Chronic kidney disease   [] Difficult urination  [] Frequent urination  [] Burning with urination   [] Hematuria Skin:  [] Rashes   [] Ulcers   [] Wounds Psychological:   [] History of anxiety   []  History of major depression.   Physical Examination  BP 124/65 (BP Location: Right Arm)   Pulse 60   Resp 16   Ht 5\' 9"  (1.753 m)   Wt 71.4 kg (157 lb 6.4 oz)   BMI 23.24 kg/m  Gen:  WD/WN, NAD./Appears younger than stated age Head: [place]/AT, No temporalis wasting. Ear/Nose/Throat: Hearing grossly intact, nares w/o erythema or drainage Eyes: Conjunctiva clear. Sclera non-icteric Neck: Supple.  Trachea midline Pulmonary:  Good air movement, no use of accessory muscles.  Cardiac: RRR, no JVD Vascular:  Vessel Right Left  Radial Palpable Palpable                                   Gastrointestinal: soft, non-tender/non-distended. No guarding/reflex.  Mild increase in aortic impulse. Musculoskeletal: Some contractures in the hands.  No obvious focal deficits.  Walks with a walker Neurologic: Sensation grossly intact in extremities.  Symmetrical.  Speech is fluent.  Psychiatric: Judgment intact, Mood & affect appropriate for pt's clinical situation. Dermatologic: No rashes or ulcers noted.  No cellulitis or open wounds.       Labs No results found for this or any previous visit (from the past 2160 hour(s)).  Radiology No results found.  Assessment/Plan Essential hypertension, benign blood pressure control important in reducing the progression of atherosclerotic diseaseand aneurysmal degeneration. On appropriate oral medications.   Stroke [facility]) Remote, no recent symptoms.  AAA (abdominal aortic aneurysm) without rupture (HCC) His aortic duplex today shows no change in his abdominal aortic aneurysm measuring 4.50 cm in maximal diameter.  This is the same as it was on his duplex 6 months ago. No surgery or intervention at this time. The patient has an asymptomatic abdominal aortic aneurysm that is greater than 4 cm but less than 5 cm in maximal diameter.  I have discussed the natural history of abdominal aortic aneurysm and the small risk  of rupture for aneurysm less than 5 cm in size.  However, as these small aneurysms tend to enlarge over time, continued surveillance with ultrasound or CT scan is mandatory.  I have also discussed optimizing medical management with hypertension and lipid control and the importance of abstinence from tobacco.  The patient is also encouraged to exercise a minimum of 30 minutes 4 times a week.  Should the patient develop new onset abdominal or  back pain or signs of peripheral embolization they are instructed to seek medical attention immediately and to alert the physician providing care that they have an aneurysm.  The patient voices their understanding. I have scheduled the patient to return in 6 months with an aortic duplex.     Leotis Pain, MD  06/29/2017 10:19 AM    This note was created with Dragon medical transcription system.  Any errors from dictation are purely unintentional

## 2017-06-29 NOTE — Patient Instructions (Signed)
Abdominal Aortic Aneurysm An aneurysm is a bulge in an artery. It happens when blood pushes up against a weakened or damaged artery wall. An abdominal aortic aneurysm is an aneurysm that occurs in the lower part of the aorta, the main artery of the body. The aorta supplies blood from the heart to the rest of the body. Some aneurysms may not cause symptoms or problems. However, the major concern with an abdominal aortic aneurysm is that it can enlarge and burst (rupture), or that blood can flow between the layers of the wall of the aorta through a tear (aorticdissection). Both of these conditions can cause bleeding inside the body and can be life-threatening unless diagnosed and treated right away. What are the causes? The exact cause of this condition is not known. What increases the risk? The following factors may make you more likely to develop this condition:  Being older than age 60.  Having a hardening of the arteries caused by the buildup of fat and other substances in the lining of a blood vessel (arteriosclerosis).  Having inflammation of the walls of an artery (arteritis).  Having a genetic disease that weakens the body's connective tissue, such as Marfan syndrome.  Having abdominal trauma.  Having an infection, such as syphilis or staphylococcus, in the wall of the aorta (infectious aortitis) caused by bacteria.  Having high blood pressure (hypertension).  Being male.  Being white (Caucasian).  Having high cholesterol.  Having a family history of aneurysms.  Using tobacco.  Having chronic obstructive pulmonary disease (COPD).  What are the signs or symptoms? Symptoms of this condition vary depending on the size and rate of growth of the aneurysm.Most aneurysms grow slowly and do not cause any symptoms. When symptoms do occur, they may include:  Pain in the abdomen, side, or lower back. The pain may vary in intensity.  Feeling full after eating only small amounts of  food.  Feeling a pulsating lump in the abdomen.  Symptoms that the aneurysm has ruptured include:  A sudden onset of severe pain in the abdomen, side, or lower back.  Nausea or vomiting.  Feeling faint or passing out.  How is this diagnosed? This condition may be diagnosed with:  A physical exam. During the exam, your health care provider will check for throbbing in your abdomen. He or she may also listen to the blood flow in your abdomen.  Tests, such as: ? An ultrasound. ? X-rays. ? A CT scan. ? An MRI. ? Tests to check your arteries for damage or blockage (angiogram).  Because most unruptured abdominal aortic aneurysms cause no symptoms, they are often found during exams for other conditions. How is this treated? Treatment for this condition depends on:  The size of the aneurysm.  How fast the aneurysm is growing.  Your age.  Risk factors for rupture.  Aneurysms that are smaller than 2 inches (5 cm) may be managed by using medicines to control blood pressure, manage pain, or fight infection. You may need regular monitoring to see if the aneurysm is getting bigger. Your health care provider may recommend that you have an ultrasound every few years, every year, or every 3-6 months. How often you need to have an ultrasound depends on the size of the aneurysm, how fast it is growing, and whether you have a family history of aneurysms. Surgical repair may be needed if your aneurysm is larger than 2 inches (5 cm). Follow these instructions at home: General instructions  Keep all   follow-up visits as told by your health care provider. This is important. ? Talk to your health care provider about regular screenings to see if the aneurysm is getting bigger.  Take over-the-counter and prescription medicines only as told by your health care provider.  Avoid heavy lifting and activities that take a lot of effort (are strenuous). Ask your health care provider what activities are  safe for you. Lifestyle Follow instructions from your health care provider about healthy lifestyle habits. Your health care provider may recommend:  Not using any products that contain nicotine or tobacco, such as cigarettes and e-cigarettes. If you need help quitting, ask your health care provider.  Limiting or avoiding alcohol.  Keeping your blood pressure within normal limits. The target limit for most people is below 120/80. Check your blood pressure regularly. If it is high, ask your health care provider about ways that you can control it.  Keeping your blood sugar level and cholesterol levels within normal limits. Target limits for most people are: ? Blood sugar level: Less than 100 mg/dL. ? Total cholesterol level: Less than 200 mg/dL.  Eating a healthy diet. This may include: ? Lowering your salt (sodium) intake. In some people, too much salt can raise blood pressure and increase the risk of abdominal aortic aneurysm. ? Avoiding foods that are high in saturated fat and cholesterol, such as red meat and dairy products. ? Eating a diet that is low in sugar. ? Increasing your fiber intake by including whole grains, vegetables, and fruits in your diet. Eating these foods may help to lower blood pressure.  Maintaining a healthy weight.  Staying physically active and exercising regularly. Talk with your health care provider about how often you should exercise and which types of exercise are safe for you.  Contact a health care provider if:  You have pain in your abdomen, side, or lower back.  You have a throbbing feeling in your abdomen.  You have a family history of aneurysms. Get help right away if:  You have sudden, severe pain in your abdomen or lower back.  You experience nausea or vomiting.  You have constipation or problems urinating.  You feel light-headed.  You have a rapid heart rate when you stand.  You have sweaty, clammy skin.  You have shortness of  breath.  You have a fever. This information is not intended to replace advice given to you by your health care provider. Make sure you discuss any questions you have with your health care provider. Document Released: 12/03/2004 Document Revised: 09/18/2015 Document Reviewed: 08/13/2015 Elsevier Interactive Patient Education  2018 Elsevier Inc.  

## 2017-08-17 ENCOUNTER — Ambulatory Visit
Admission: RE | Admit: 2017-08-17 | Discharge: 2017-08-17 | Disposition: A | Discharge status: Home / Self Care | Payer: Medicare Other | Source: Ambulatory Visit | Attending: Student | Admitting: Student

## 2017-08-17 ENCOUNTER — Other Ambulatory Visit: Payer: Self-pay | Admitting: Student

## 2017-08-17 DIAGNOSIS — M79605 Pain in left leg: Secondary | ICD-10-CM | POA: Diagnosis not present

## 2018-01-07 ENCOUNTER — Encounter (INDEPENDENT_AMBULATORY_CARE_PROVIDER_SITE_OTHER): Payer: Self-pay | Admitting: Vascular Surgery

## 2018-01-07 ENCOUNTER — Ambulatory Visit (INDEPENDENT_AMBULATORY_CARE_PROVIDER_SITE_OTHER): Payer: Medicare Other

## 2018-01-07 ENCOUNTER — Ambulatory Visit (INDEPENDENT_AMBULATORY_CARE_PROVIDER_SITE_OTHER): Payer: Medicare Other | Admitting: Vascular Surgery

## 2018-01-07 VITALS — BP 114/70 | HR 53 | Resp 17 | Ht 69.0 in | Wt 158.0 lb

## 2018-01-07 DIAGNOSIS — I714 Abdominal aortic aneurysm, without rupture, unspecified: Secondary | ICD-10-CM

## 2018-01-07 DIAGNOSIS — Z87891 Personal history of nicotine dependence: Secondary | ICD-10-CM | POA: Diagnosis not present

## 2018-01-07 DIAGNOSIS — I639 Cerebral infarction, unspecified: Secondary | ICD-10-CM

## 2018-01-07 DIAGNOSIS — I10 Essential (primary) hypertension: Secondary | ICD-10-CM | POA: Diagnosis not present

## 2018-01-07 NOTE — Progress Notes (Signed)
MRN : [id number]  Steve Bryant is a 84 y.o. (06-24-1925) male who presents with chief complaint of  Chief Complaint  Patient presents with  . Follow-up    6 month AAA  .  History of Present Illness: Patient returns today in follow up of his aneurysm.  He is doing well without any new complaints.  No aneurysm related symptoms. Specifically, the patient denies new back or abdominal pain, or signs of peripheral embolization.  His aortic duplex today shows essentially no change of his aneurysm measuring approximately 4.3 cm in maximal diameter.  Current Outpatient Medications  Medication Sig Dispense Refill  . amLODipine (NORVASC) 2.5 MG tablet Take 2.5 mg by mouth daily.  2  . aspirin EC 81 MG tablet Take by mouth.    . clopidogrel (PLAVIX) 75 MG tablet Take by mouth.    . dorzolamide (TRUSOPT) 2 % ophthalmic solution INSTILL ONE DROP TO BOTH EYES TWICE DAILY    . dorzolamide-timolol (COSOPT) 22.3-6.8 MG/ML ophthalmic solution INSTILL ONE DROP TWICE DAILY TO BOTH EYES  2  . latanoprost (XALATAN) 0.005 % ophthalmic solution USE 1 DROP IN THE RIGHT EYE AT BEDTIME AS DIRECTED  2  . losartan (COZAAR) 25 MG tablet Take 1 tablet (25 mg total) by mouth daily. 30 tablet 2  . LYRICA 75 MG capsule Take 75 mg by mouth 3 (three) times daily.  0  . Melatonin 10 MG TABS Take 1 tablet by mouth at bedtime.    . Multiple Vitamin (MULTIVITAMIN) tablet Take 1 tablet by mouth daily.    [name] omeprazole (PRILOSEC) 20 MG capsule Take 20 mg by mouth daily.    [name] ROCKLATAN 0.02-0.005 % SOLN INSTILL 1 DROP IN THE RIGHT EYE ONCE IN THE EVENING  5  . rosuvastatin (CRESTOR) 5 MG tablet Take 5 mg by mouth daily.     No current facility-administered medications for this visit.     Past Medical History:  Diagnosis Date  . AAA (abdominal aortic aneurysm) ([place])   . Arthritis   . Glaucoma   . Hyperlipidemia   . Hypertension   . Stroke [facility])     Past Surgical History:  Procedure Laterality Date  . EYE  SURGERY    . neck fusion    . TUMOR REMOVAL      Social History   Substance Use Topics   . Smoking status: Former Research scientist (life sciences)   . Smokeless tobacco: Never Used   . Alcohol use Yes     Comment: occasionally      Family History      Family History  Problem Relation Age of Onset  . Stroke Mother   . Stroke Father   . Cancer Brother           Allergies  Allergen Reactions  . Shellfish-Derived Products Nausea Only    Scallops     REVIEW OF SYSTEMS(Negative unless checked)  Constitutional: [] Weight loss[] Fever[] Chills Cardiac:[] Chest pain[] Chest pressure[] Palpitations [] Shortness of breath when laying flat [] Shortness of breath at rest [] Shortness of breath with exertion. Vascular: [] Pain in legs with walking[] Pain in legsat rest[] Pain in legs when laying flat [] Claudication [] Pain in feet when walking [] Pain in feet at rest [] Pain in feet when laying flat [] History of DVT [] Phlebitis [] Swelling in legs [] Varicose veins [] Non-healing ulcers Pulmonary: [] Uses home oxygen [] Productive cough[] Hemoptysis [] Wheeze [] COPD [] Asthma Neurologic: [] Dizziness [] Blackouts [] Seizures [x] History of stroke [] History of TIA[] Aphasia [] Temporary blindness[] Dysphagia [] Weaknessor numbness in arms [] Weakness or numbnessin legs Musculoskeletal: [x] Arthritis [] Joint swelling [] Joint pain [] Low  back pain Hematologic:[] Easy bruising[] Easy bleeding [] Hypercoagulable state [] Anemic  Gastrointestinal:[] Blood in stool[] Vomiting blood[x] Gastroesophageal reflux/heartburn[] Abdominal pain Genitourinary: [] Chronic kidney disease [] Difficulturination [] Frequenturination [] Burning with urination[] Hematuria Skin: [] Rashes [] Ulcers [] Wounds Psychological: [] History of anxiety[] History of major depression.    Physical Examination  BP 114/70 (BP Location: Right  Arm, Patient Position: Sitting)   Pulse (!) 53   Resp 17   Ht 5\' 9"  (1.753 m)   Wt 158 lb (71.7 kg)   BMI 23.33 kg/m  Gen:  WD/WN, NAD. Appears younger than stated age Head: [place]/AT, No temporalis wasting. Ear/Nose/Throat: Hearing grossly intact, nares w/o erythema or drainage Eyes: Conjunctiva clear. Sclera non-icteric Neck: Supple.  Trachea midline Pulmonary:  Good air movement, no use of accessory muscles.  Cardiac: RRR, no JVD Vascular:  Vessel Right Left  Radial Palpable Palpable                                   Gastrointestinal: soft, non-tender/non-distended.  Mild increased aortic impulse Musculoskeletal: M/S 5/5 throughout.  No deformity or atrophy.  No edema. Neurologic: Sensation grossly intact in extremities.  Symmetrical.  Speech is fluent.  Psychiatric: Judgment intact, Mood & affect appropriate for pt's clinical situation. Dermatologic: No rashes or ulcers noted.  No cellulitis or open wounds.       Labs No results found for this or any previous visit (from the past 2160 hour(s)).  Radiology No results found.  Assessment/Plan Essential hypertension, benign blood pressure control important in reducing the progression of atherosclerotic diseaseand aneurysmal degeneration. On appropriate oral medications.   Stroke [facility]) Remote, no recent symptoms.  AAA (abdominal aortic aneurysm) without rupture (HCC) His aortic duplex today shows essentially no change of his aneurysm measuring approximately 4.3 cm in maximal diameter.  No surgery or intervention at this time. The patient has an asymptomatic abdominal aortic aneurysm that is greater than 4 cm but less than 5 cm in maximal diameter.  I have discussed the natural history of abdominal aortic aneurysm and the small risk of rupture for aneurysm less than 5 cm in size.  However, as these small aneurysms tend to enlarge over time, continued surveillance with ultrasound or CT scan is mandatory.  I have  also discussed optimizing medical management with hypertension and lipid control and the importance of abstinence from tobacco.  The patient is also encouraged to exercise a minimum of 30 minutes 4 times a week.  Should the patient develop new onset abdominal or back pain or signs of peripheral embolization they are instructed to seek medical attention immediately and to alert the physician providing care that they have an aneurysm.  The patient voices their understanding. I have scheduled the patient to return in 6 months with an aortic duplex.    Leotis Pain, MD  01/07/2018 11:47 AM    This note was created with Dragon medical transcription system.  Any errors from dictation are purely unintentional

## 2018-01-07 NOTE — Assessment & Plan Note (Signed)
His aortic duplex today shows essentially no change of his aneurysm measuring approximately 4.3 cm in maximal diameter.  No surgery or intervention at this time. The patient has an asymptomatic abdominal aortic aneurysm that is greater than 4 cm but less than 5 cm in maximal diameter.  I have discussed the natural history of abdominal aortic aneurysm and the small risk of rupture for aneurysm less than 5 cm in size.  However, as these small aneurysms tend to enlarge over time, continued surveillance with ultrasound or CT scan is mandatory.  I have also discussed optimizing medical management with hypertension and lipid control and the importance of abstinence from tobacco.  The patient is also encouraged to exercise a minimum of 30 minutes 4 times a week.  Should the patient develop new onset abdominal or back pain or signs of peripheral embolization they are instructed to seek medical attention immediately and to alert the physician providing care that they have an aneurysm.  The patient voices their understanding. I have scheduled the patient to return in 6 months with an aortic duplex.

## 2018-01-07 NOTE — Patient Instructions (Signed)
Abdominal Aortic Aneurysm Blood pumps away from the heart through tubes (blood vessels) called arteries. Aneurysms are weak or damaged places in the wall of an artery. It bulges out like a balloon. An abdominal aortic aneurysm happens in the main artery of the body (aorta). It can burst or tear, causing bleeding inside the body. This is an emergency. It needs treatment right away. What are the causes? The exact cause is unknown. Things that could cause this problem include:  Fat and other substances building up in the lining of a tube.  Swelling of the walls of a blood vessel.  Certain tissue diseases.  Belly (abdominal) trauma.  An infection in the main artery of the body.  What increases the risk? There are things that make it more likely for you to have an aneurysm. These include:  Being over the age of 82 years old.  Having high blood pressure (hypertension).  Being a male.  Being white.  Being very overweight (obese).  Having a family history of aneurysm.  Using tobacco products.  What are the signs or symptoms? Symptoms depend on the size of the aneurysm and how fast it grows. There may not be symptoms. If symptoms occur, they can include:  Pain (belly, side, lower back, or groin).  Feeling full after eating a small amount of food.  Feeling sick to your stomach (nauseous), throwing up (vomiting), or both.  Feeling a lump in your belly that feels like it is beating (pulsating).  Feeling like you will pass out (faint).  How is this treated?  Medicine to control blood pressure and pain.  Imaging tests to see if the aneurysm gets bigger.  Surgery. How is this prevented? To lessen your chance of getting this condition:  Stop smoking. Stop chewing tobacco.  Limit or avoid alcohol.  Keep your blood pressure, blood sugar, and cholesterol within normal limits.  Eat less salt.  Eat foods low in saturated fats and cholesterol. These are found in animal and  whole dairy products.  Eat more fiber. Fiber is found in whole grains, vegetables, and fruits.  Keep a healthy weight.  Stay active and exercise often.  This information is not intended to replace advice given to you by your health care provider. Make sure you discuss any questions you have with your health care provider. Document Released: 06/20/2012 Document Revised: 08/01/2015 Document Reviewed: 03/25/2012 Elsevier Interactive Patient Education  2017 Elsevier Inc.  

## 2018-07-08 ENCOUNTER — Other Ambulatory Visit (INDEPENDENT_AMBULATORY_CARE_PROVIDER_SITE_OTHER): Payer: Medicare Other

## 2018-07-08 ENCOUNTER — Ambulatory Visit (INDEPENDENT_AMBULATORY_CARE_PROVIDER_SITE_OTHER): Payer: Medicare Other | Admitting: Vascular Surgery

## 2018-07-26 ENCOUNTER — Encounter (INDEPENDENT_AMBULATORY_CARE_PROVIDER_SITE_OTHER): Payer: Self-pay | Admitting: Vascular Surgery

## 2018-07-26 ENCOUNTER — Ambulatory Visit (INDEPENDENT_AMBULATORY_CARE_PROVIDER_SITE_OTHER): Payer: Medicare Other | Admitting: Vascular Surgery

## 2018-07-26 ENCOUNTER — Ambulatory Visit (INDEPENDENT_AMBULATORY_CARE_PROVIDER_SITE_OTHER): Payer: Medicare Other

## 2018-07-26 ENCOUNTER — Other Ambulatory Visit: Payer: Self-pay

## 2018-07-26 VITALS — BP 125/71 | HR 51 | Resp 10 | Ht 69.0 in | Wt 158.0 lb

## 2018-07-26 DIAGNOSIS — I714 Abdominal aortic aneurysm, without rupture, unspecified: Secondary | ICD-10-CM

## 2018-07-26 DIAGNOSIS — Z79899 Other long term (current) drug therapy: Secondary | ICD-10-CM | POA: Diagnosis not present

## 2018-07-26 DIAGNOSIS — I639 Cerebral infarction, unspecified: Secondary | ICD-10-CM

## 2018-07-26 DIAGNOSIS — I10 Essential (primary) hypertension: Secondary | ICD-10-CM | POA: Diagnosis not present

## 2018-07-26 NOTE — Progress Notes (Signed)
MRN : [id number]  Steve Bryant is a 85 y.o. (07-16-1925) male who presents with chief complaint of  Chief Complaint  Patient presents with  . Follow-up  .  History of Present Illness: Patient returns today in follow up of his abdominal aortic aneurysm.  He is doing well today and denies any complaints.  He has no aneurysm related symptoms. Specifically, the patient denies new back or abdominal pain, or signs of peripheral embolization. His duplex today demonstrates some growth of the abdominal aortic aneurysm now measuring 4.66 cm in maximal diameter.  Previously, this measured 4.3 cm in maximal diameter.  Current Outpatient Medications  Medication Sig Dispense Refill  . amLODipine (NORVASC) 2.5 MG tablet Take 2.5 mg by mouth daily.  2  . aspirin EC 81 MG tablet Take by mouth.    . clopidogrel (PLAVIX) 75 MG tablet Take 75 mg by mouth daily.    . dorzolamide-timolol (COSOPT) 22.3-6.8 MG/ML ophthalmic solution INSTILL ONE DROP TWICE DAILY TO BOTH EYES  2  . latanoprost (XALATAN) 0.005 % ophthalmic solution USE 1 DROP IN THE RIGHT EYE AT BEDTIME AS DIRECTED  2  . losartan (COZAAR) 25 MG tablet Take 1 tablet (25 mg total) by mouth daily. 30 tablet 2  . LYRICA 75 MG capsule Take 75 mg by mouth 3 (three) times daily.  0  . Melatonin 10 MG TABS Take 1 tablet by mouth at bedtime.    . Multiple Vitamin (MULTIVITAMIN) tablet Take 1 tablet by mouth daily.    . rosuvastatin (CRESTOR) 5 MG tablet Take 5 mg by mouth daily.    . dorzolamide (TRUSOPT) 2 % ophthalmic solution INSTILL ONE DROP TO BOTH EYES TWICE DAILY    . omeprazole (PRILOSEC) 20 MG capsule Take 20 mg by mouth daily.    [name] ROCKLATAN 0.02-0.005 % SOLN INSTILL 1 DROP IN THE RIGHT EYE ONCE IN THE EVENING  5   No current facility-administered medications for this visit.     Past Medical History:  Diagnosis Date  . AAA (abdominal aortic aneurysm) ([place])   . Arthritis   . Glaucoma   . Hyperlipidemia   . Hypertension   . Stroke  [facility])     Past Surgical History:  Procedure Laterality Date  . EYE SURGERY    . neck fusion    . TUMOR REMOVAL      Social History Social History   Tobacco Use  . Smoking status: Former Research scientist (life sciences)  . Smokeless tobacco: Never Used  Substance Use Topics  . Alcohol use: Yes    Comment: occasionally  . Drug use: No     Family History Family History  Problem Relation Age of Onset  . Stroke Mother   . Stroke Father   . Cancer Brother     Allergies  Allergen Reactions  . Shellfish-Derived Products Nausea Only    Scallops    REVIEW OF SYSTEMS(Negative unless checked)  Constitutional: [] ?Weight loss[] ?Fever[] ?Chills Cardiac:[] ?Chest pain[] ?Chest pressure[] ?Palpitations [] ?Shortness of breath when laying flat [] ?Shortness of breath at rest [] ?Shortness of breath with exertion. Vascular: [] ?Pain in legs with walking[] ?Pain in legsat rest[] ?Pain in legs when laying flat [] ?Claudication [] ?Pain in feet when walking [] ?Pain in feet at rest [] ?Pain in feet when laying flat [] ?History of DVT [] ?Phlebitis [] ?Swelling in legs [] ?Varicose veins [] ?Non-healing ulcers Pulmonary: [] ?Uses home oxygen [] ?Productive cough[] ?Hemoptysis [] ?Wheeze [] ?COPD [] ?Asthma Neurologic: [] ?Dizziness [] ?Blackouts [] ?Seizures [x] ?History of stroke [] ?History of TIA[] ?Aphasia [] ?Temporary blindness[] ?Dysphagia [] ?Weaknessor numbness in arms [] ?Weakness or numbnessin legs Musculoskeletal: [x] ?Arthritis [] ?Joint swelling [] ?  Joint pain [] ?Low back pain Hematologic:[] ?Easy bruising[] ?Easy bleeding [] ?Hypercoagulable state [] ?Anemic  Gastrointestinal:[] ?Blood in stool[] ?Vomiting blood[x] ?Gastroesophageal reflux/heartburn[] ?Abdominal pain Genitourinary: [] ?Chronic kidney disease [] ?Difficulturination [] ?Frequenturination [] ?Burning with urination[] ?Hematuria Skin: [] ?Rashes [] ?Ulcers [] ?Wounds  Psychological: [] ?History of anxiety[] ?History of major depression.  Physical Examination  BP 125/71 (BP Location: Left Arm, Patient Position: Sitting, Cuff Size: Large)   Pulse (!) 51   Resp 10   Ht 5\' 9"  (1.753 m)   Wt 158 lb (71.7 kg)   BMI 23.33 kg/m  Gen:  WD/WN, NAD. Appears younger than stated age. Head: [place]/AT, No temporalis wasting. Ear/Nose/Throat: Hearing grossly intact, nares w/o erythema or drainage Eyes: Conjunctiva clear. Sclera non-icteric Neck: Supple.  Trachea midline Pulmonary:  Good air movement, no use of accessory muscles.  Cardiac: RRR, no JVD Vascular:  Vessel Right Left  Radial Palpable Palpable                                   Gastrointestinal: soft, non-tender/non-distended. No guarding/reflex. Mild increased aortic impulse Musculoskeletal: M/S 5/5 throughout.  No deformity or atrophy. Neurologic: Sensation grossly intact in extremities.  Symmetrical.  Speech is fluent.  Psychiatric: Judgment intact, Mood & affect appropriate for pt's clinical situation. Dermatologic: No rashes or ulcers noted.  No cellulitis or open wounds.       Labs No results found for this or any previous visit (from the past 2160 hour(s)).  Radiology No results found.  Assessment/Plan Essential hypertension, benign blood pressure control important in reducing the progression of atherosclerotic diseaseand aneurysmal degeneration. On appropriate oral medications.   Stroke [facility]) Remote, no recent symptoms.  AAA (abdominal aortic aneurysm) without rupture (HCC) His duplex today demonstrates some growth of the abdominal aortic aneurysm now measuring 4.66 cm in maximal diameter.  This is still below our threshold for repair, but has grown some since it was last checked 6 months ago.  I will check it again in 6 months.  If it continues to grow, we would consider a CT angiogram for further evaluation.    Leotis Pain, MD  07/26/2018 10:12 AM    This note  was created with Dragon medical transcription system.  Any errors from dictation are purely unintentional

## 2018-07-26 NOTE — Patient Instructions (Signed)
Abdominal Aortic Aneurysm    An aneurysm is a bulge in one of the blood vessels that carry blood away from the heart (artery). It happens when blood pushes up against a weak or damaged place in the wall of an artery. An abdominal aortic aneurysm happens in the main artery of the body (aorta).  Some aneurysms may not cause problems. If it grows, it can burst or tear, causing bleeding inside the body. This is an emergency. It needs to be treated right away.  What are the causes?  The exact cause of this condition is not known.  What increases the risk?  The following may make you more likely to get this condition:   Being a male who is 60 years of age or older.   Being white (Caucasian).   Using tobacco.   Having a family history of aneurysms.   Having the following conditions:  ? Hardening of the arteries (arteriosclerosis).  ? Inflammation of the walls of an artery (arteritis).  ? Certain genetic conditions.  ? Being very overweight (obesity).  ? An infection in the wall of the aorta (infectious aortitis).  ? High cholesterol.  ? High blood pressure (hypertension).  What are the signs or symptoms?  Symptoms depend on the size of the aneurysm and how fast it is growing. Most grow slowly and do not cause any symptoms. If symptoms do occur, they may include:   Pain in the belly (abdomen), side, or back.   Feeling full after eating only small amounts of food.   Feeling a throbbing lump in the belly.  Symptoms that the aneurysm has burst (ruptured) include:   Sudden, very bad pain in the belly, side, or back.   Feeling sick to your stomach (nauseous).   Throwing up (vomiting).   Feeling light-headed or passing out.  How is this treated?  Treatment for this condition depends on:   The size of the aneurysm.   How fast it is growing.   Your age.   Your risk of having it burst.  If your aneurysm is smaller than 2 inches (5 cm), your doctor may manage it by:   Checking it often to see if it is getting bigger.  You may have an imaging test (ultrasound) to check it every 3-6 months, every year, or every few years.   Giving you medicines to:  ? Control blood pressure.  ? Treat pain.  ? Fight infection.  If your aneurysm is larger than 2 inches (5 cm), you may need surgery to fix it.  Follow these instructions at home:  Lifestyle   Do not use any products that have nicotine or tobacco in them. This includes cigarettes, e-cigarettes, and chewing tobacco. If you need help quitting, ask your doctor.   Get regular exercise. Ask your doctor what types of exercise are best for you.  Eating and drinking   Eat a heart-healthy diet. This includes eating plenty of:  ? Fresh fruits and vegetables.  ? Whole grains.  ? Low-fat (lean) protein.  ? Low-fat dairy products.   Avoid foods that are high in saturated fat and cholesterol. These foods include red meat and some dairy products.   Do not drink alcohol if:  ? Your doctor tells you not to drink.  ? You are pregnant, may be pregnant, or are planning to become pregnant.   If you drink alcohol:  ? Limit how much you use to:   0-1 drink a day for women.     0-2 drinks a day for men.  ? Be aware of how much alcohol is in your drink. In the [place] one drink equals any of these:   One typical bottle of beer (12 oz).   One-half glass of wine (5 oz).   One shot of hard liquor (1 oz).  General instructions   Take over-the-counter and prescription medicines only as told by your doctor.   Keep your blood pressure within normal limits. Ask your doctor what your blood pressure should be.   Have your blood sugar (glucose) level and cholesterol levels checked regularly. Keep your blood sugar level and cholesterol levels within normal limits.   Avoid heavy lifting and activities that take a lot of effort. Ask your doctor what activities are safe for you.   Keep all follow-up visits as told by your doctor. This is important.  ? Talk to your doctor about regular screenings to see if the  aneurysm is getting bigger.  Contact a doctor if you:   Have pain in your belly, side, or back.   Have a throbbing feeling in your belly.   Have a family history of aneurysms.  Get help right away if you:   Have sudden, bad pain in your belly, side, or back.   Feel sick to your stomach.   Throw up.   Have trouble pooping (constipation).   Have trouble peeing (urinating).   Feel light-headed.   Have a fast heart rate when you stand.   Have sweaty skin that is cold to the touch (clammy).   Have shortness of breath.   Have a fever.  These symptoms may be an emergency. Do not wait to see if the symptoms will go away. Get medical help right away. Call your local emergency services (911 in the [place] Do not drive yourself to the hospital.  Summary   An aneurysm is a bulge in one of the blood vessels that carry blood away from the heart (artery). Some aneurysms may not cause problems.   You may need to have yours checked often. If it grows, it can burst or tear. This causes bleeding inside the body. It needs to be treated right away.   Follow instructions from your doctor about healthy lifestyle changes.   Keep all follow-up visits as told by your doctor. This is important.  This information is not intended to replace advice given to you by your health care provider. Make sure you discuss any questions you have with your health care provider.  Document Released: 06/20/2012 Document Revised: 10/02/2017 Document Reviewed: 10/02/2017  Elsevier Interactive Patient Education  2019 [employer] Inc[employer]

## 2018-07-26 NOTE — Assessment & Plan Note (Signed)
His duplex today demonstrates some growth of the abdominal aortic aneurysm now measuring 4.66 cm in maximal diameter.  This is still below our threshold for repair, but has grown some since it was last checked 6 months ago.  I will check it again in 6 months.  If it continues to grow, we would consider a CT angiogram for further evaluation.

## 2018-12-22 ENCOUNTER — Telehealth (INDEPENDENT_AMBULATORY_CARE_PROVIDER_SITE_OTHER): Payer: Self-pay

## 2018-12-23 NOTE — Telephone Encounter (Signed)
I spoke with Steve Bryant and she will be sending the message to Steve Bryant to make her aware with Dr Lucky Cowboy medical advice

## 2018-12-26 ENCOUNTER — Other Ambulatory Visit (INDEPENDENT_AMBULATORY_CARE_PROVIDER_SITE_OTHER): Payer: Self-pay | Admitting: Vascular Surgery

## 2018-12-26 ENCOUNTER — Telehealth (INDEPENDENT_AMBULATORY_CARE_PROVIDER_SITE_OTHER): Payer: Self-pay | Admitting: Nurse Practitioner

## 2018-12-26 DIAGNOSIS — I714 Abdominal aortic aneurysm, without rupture, unspecified: Secondary | ICD-10-CM

## 2018-12-26 NOTE — Telephone Encounter (Signed)
Molly with [facility] [phone] left message requesting we contact her regarding this patient. I have returned her call and left message with receptionist to have [facility] contact us back at her convenience. AS, CMA

## 2018-12-26 NOTE — Telephone Encounter (Signed)
Patients daughter is aware that radiology is going to call them and that he needs the CT STAT and to call to schedule apt in our office for this week after CT scheduled per Dew. AS, CMA

## 2018-12-26 NOTE — Telephone Encounter (Signed)
Patient is scheduled for STAT CTA on 12/27/18 at 12 noon and then to follow up in our office at 4pm same day with Dew. Patient daughter is aware of patients apts and that he needs to be NPO after 8am.   Radiology requested Creatinine and BUN ordered with STAT CTA. AS, CMA

## 2018-12-26 NOTE — Telephone Encounter (Signed)
Per Mickel Baas an order for the CT needs to be put in. Can you please place the order. AS, CMA

## 2018-12-26 NOTE — Telephone Encounter (Signed)
I placed an order for a STAT CTA abdomen / pelvis. Can someone please reach out to the patient to let him know he should be getting a call from our radiology department to schedule. I added [employer] authorization is needed.

## 2018-12-26 NOTE — Telephone Encounter (Signed)
BUN + Creatinine was ordered with CTA at the same time. But will place another order just in case.

## 2018-12-26 NOTE — Telephone Encounter (Signed)
Patient has apt to come in on 01/31/19. Please advise if you would like to move apt to sooner date. AS, CMA

## 2018-12-27 ENCOUNTER — Encounter (INDEPENDENT_AMBULATORY_CARE_PROVIDER_SITE_OTHER): Payer: Self-pay | Admitting: Vascular Surgery

## 2018-12-27 ENCOUNTER — Ambulatory Visit (INDEPENDENT_AMBULATORY_CARE_PROVIDER_SITE_OTHER): Payer: Medicare Other | Admitting: Vascular Surgery

## 2018-12-27 ENCOUNTER — Ambulatory Visit
Admission: RE | Admit: 2018-12-27 | Discharge: 2018-12-27 | Disposition: A | Discharge status: Home / Self Care | Payer: Medicare Other | Source: Ambulatory Visit | Attending: Vascular Surgery | Admitting: Vascular Surgery

## 2018-12-27 ENCOUNTER — Other Ambulatory Visit: Payer: Self-pay

## 2018-12-27 VITALS — BP 146/77 | HR 60 | Resp 16

## 2018-12-27 DIAGNOSIS — I639 Cerebral infarction, unspecified: Secondary | ICD-10-CM | POA: Diagnosis not present

## 2018-12-27 DIAGNOSIS — I10 Essential (primary) hypertension: Secondary | ICD-10-CM | POA: Diagnosis not present

## 2018-12-27 DIAGNOSIS — I714 Abdominal aortic aneurysm, without rupture, unspecified: Secondary | ICD-10-CM

## 2018-12-27 DIAGNOSIS — E785 Hyperlipidemia, unspecified: Secondary | ICD-10-CM | POA: Diagnosis not present

## 2018-12-27 MED ORDER — IOHEXOL 350 MG/ML SOLN
100.0000 mL | Freq: Once | INTRAVENOUS | Status: AC | PRN
  Administered 2018-12-27: 100 mL via INTRAVENOUS

## 2018-12-27 NOTE — Assessment & Plan Note (Signed)
I have independently reviewed his CT angiogram performed today.  This demonstrates an approximately 4.6 to 4.7 cm infrarenal abdominal aortic aneurysm.  This is essentially unchanged from his previous study by duplex 5 months ago.  No role for intervention at this level particularly in a nonagenarian.  Continue 47-month follow-up with duplex in our office.

## 2018-12-27 NOTE — Progress Notes (Signed)
MRN : [id number]  Steve Bryant is a 85 y.o. ([birth year]) male who presents with chief complaint of  Chief Complaint  Patient presents with  . Follow-up    ct results  .  History of Present Illness: Patient returns today in follow up of his AAA before his scheduled appointment.  This is done because a recent back XRay suggests massive enlargement up to 6.6 cm in diameter. He had a fall with back pain that is better.  He had a CT scan today. I have independently reviewed his CT angiogram performed today.  This demonstrates an approximately 4.6 to 4.7 cm infrarenal abdominal aortic aneurysm.  This is essentially unchanged from his previous study by duplex 5 months ago.  Current Outpatient Medications  Medication Sig Dispense Refill  . amLODipine (NORVASC) 2.5 MG tablet Take 2.5 mg by mouth daily.  2  . aspirin EC 81 MG tablet Take by mouth.    . clopidogrel (PLAVIX) 75 MG tablet Take 75 mg by mouth daily.    . dorzolamide (TRUSOPT) 2 % ophthalmic solution INSTILL ONE DROP TO BOTH EYES TWICE DAILY    . dorzolamide-timolol (COSOPT) 22.3-6.8 MG/ML ophthalmic solution INSTILL ONE DROP TWICE DAILY TO BOTH EYES  2  . latanoprost (XALATAN) 0.005 % ophthalmic solution USE 1 DROP IN THE RIGHT EYE AT BEDTIME AS DIRECTED  2  . losartan (COZAAR) 25 MG tablet Take 1 tablet (25 mg total) by mouth daily. 30 tablet 2  . LYRICA 75 MG capsule Take 75 mg by mouth 3 (three) times daily.  0  . Melatonin 10 MG TABS Take 1 tablet by mouth at bedtime.    . Multiple Vitamin (MULTIVITAMIN) tablet Take 1 tablet by mouth daily.    [name] omeprazole (PRILOSEC) 20 MG capsule Take 20 mg by mouth daily.    [name] ROCKLATAN 0.02-0.005 % SOLN INSTILL 1 DROP IN THE RIGHT EYE ONCE IN THE EVENING  5  . rosuvastatin (CRESTOR) 5 MG tablet Take 5 mg by mouth daily.     No current facility-administered medications for this visit.     Past Medical History:  Diagnosis Date  . AAA (abdominal aortic aneurysm) ([place])   . Arthritis    . Glaucoma   . Hyperlipidemia   . Hypertension   . Stroke [facility])     Past Surgical History:  Procedure Laterality Date  . EYE SURGERY    . neck fusion    . TUMOR REMOVAL     Social History        Tobacco Use  . Smoking status: Former Research scientist (life sciences)  . Smokeless tobacco: Never Used  Substance Use Topics  . Alcohol use: Yes    Comment: occasionally  . Drug use: No     Family History      Family History  Problem Relation Age of Onset  . Stroke Mother   . Stroke Father   . Cancer Brother          Allergies  Allergen Reactions  . Shellfish-Derived Products Nausea Only    Scallops    REVIEW OF SYSTEMS(Negative unless checked)  Constitutional: [] ??Weight loss[] ??Fever[] ??Chills Cardiac:[] ??Chest pain[] ??Chest pressure[] ??Palpitations [] ??Shortness of breath when laying flat [] ??Shortness of breath at rest [] ??Shortness of breath with exertion. Vascular: [] ??Pain in legs with walking[] ??Pain in legsat rest[] ??Pain in legs when laying flat [] ??Claudication [] ??Pain in feet when walking [] ??Pain in feet at rest [] ??Pain in feet when laying flat [] ??History of DVT [] ??Phlebitis [] ??Swelling in legs [] ??Varicose veins [] ??Non-healing ulcers Pulmonary: [] ??  Uses home oxygen [] ??Productive cough[] ??Hemoptysis [] ??Wheeze [] ??COPD [] ??Asthma Neurologic: [] ??Dizziness [] ??Blackouts [] ??Seizures [x] ??History of stroke [] ??History of TIA[] ??Aphasia [] ??Temporary blindness[] ??Dysphagia [] ??Weaknessor numbness in arms [] ??Weakness or numbnessin legs Musculoskeletal: [x] ??Arthritis [] ??Joint swelling [] ??Joint pain [] ??Low back pain Hematologic:[] ??Easy bruising[] ??Easy bleeding [] ??Hypercoagulable state [] ??Anemic  Gastrointestinal:[] ??Blood in stool[] ??Vomiting blood[x] ??Gastroesophageal reflux/heartburn[] ??Abdominal pain Genitourinary: [] ??Chronic kidney disease  [] ??Difficulturination [] ??Frequenturination [] ??Burning with urination[] ??Hematuria Skin: [] ??Rashes [] ??Ulcers [] ??Wounds Psychological: [] ??History of anxiety[] ??History of major depression.    Physical Examination  BP (!) 146/77 (BP Location: Right Arm)   Pulse 60   Resp 16  Gen:  WD/WN, NAD Head: [place]/AT, No temporalis wasting. Ear/Nose/Throat: Hearing grossly intact, nares w/o erythema or drainage Eyes: Conjunctiva clear. Sclera non-icteric Neck: Supple.  Trachea midline Pulmonary:  Good air movement, no use of accessory muscles.  Cardiac: RRR, no JVD Vascular:  Vessel Right Left  Radial Palpable Palpable                                   Gastrointestinal: soft, non-tender/non-distended. Moderating increased aortic impulse. Musculoskeletal: M/S 5/5 throughout.  No deformity or atrophy. No edema. Neurologic: Sensation grossly intact in extremities.  Symmetrical.  Speech is fluent.  Psychiatric: Judgment intact, Mood & affect appropriate for pt's clinical situation. Dermatologic: No rashes or ulcers noted.  No cellulitis or open wounds.       Labs No results found for this or any previous visit (from the past 2160 hour(s)).  Radiology Ct Angio Abd/pel W/ And/or W/o  Result Date: 12/27/2018 CLINICAL DATA:  85 year old male with a history of abdominal aortic aneurysm. EXAM: CTA ABDOMEN AND PELVIS WITHOUT AND WITH CONTRAST TECHNIQUE: Multidetector CT imaging of the abdomen and pelvis was performed using the standard protocol during bolus administration of intravenous contrast. Multiplanar reconstructed images and MIPs were obtained and reviewed to evaluate the vascular anatomy. CONTRAST:  156mL OMNIPAQUE IOHEXOL 350 MG/ML SOLN COMPARISON:  Prior PET-CT 07/05/2009 FINDINGS: VASCULAR Aorta: Fusiform aneurysmal dilatation of the infrarenal abdominal aorta with maximal transverse diameter of 4.7 x 4.6 cm on the coronal and sagittal reformatted images.  Additionally, there is smooth wall adherent mural thrombus and extensive calcifications throughout the abdominal aorta and branch arteries. Celiac: Calcified plaque at the origin without significant stenosis. No aneurysm or dissection. SMA: Heavily calcified plaque at the origin results in at least moderate focal stenosis. No dissection or aneurysm. Renals: Solitary renal arteries bilaterally. Mixed calcified and fibrofatty atherosclerotic plaque results in at least moderate stenosis of the right renal artery. Predominantly calcified atherosclerotic plaque results in moderate stenosis of the origin of the left renal artery. IMA: Patent without evidence of aneurysm, dissection, vasculitis or significant stenosis. Inflow: Calcified atherosclerotic plaque diffusely without significant stenosis, dissection or aneurysm. Proximal Outflow: Fibrofatty atherosclerotic plaque results in mild stenosis of the proximal left superficial femoral artery. No aneurysm or dissection on either side. Veins: No focal venous abnormality. Review of the MIP images confirms the above findings. NON-VASCULAR Lower chest: Calcifications visualized along the coronary arteries. The heart is normal in size. Unremarkable distal esophagus. Mild dependent atelectasis. Peripheral and basilar subpleural reticulation and mild architectural distortion consistent with early fibrotic change. No suspicious pulmonary mass or nodule. Hepatobiliary: Normal hepatic contour and morphology. No discrete hepatic lesion. Calcification of the gallbladder wall. No evidence of biliary ductal dilatation. No cholelithiasis. Pancreas: Unremarkable. No pancreatic ductal dilatation or surrounding inflammatory changes. Spleen: Small calcifications in the spleen consistent with old granulomatous disease. Adrenals/Urinary Tract: Normal adrenal glands. No  evidence of hydronephrosis, nephrolithiasis or enhancing renal mass. Circumscribed water attenuation simple cysts are  present bilaterally. The largest measures 3.2 cm arising from the interpolar region of the left kidney. The ureters and bladder are unremarkable. Stomach/Bowel: No focal bowel wall thickening or evidence of obstruction. There are a few mild colonic diverticula. No evidence of active diverticulitis. Lymphatic: No suspicious lymphadenopathy. Reproductive: Prostatomegaly. Other: Small fat containing left inguinal hernia. No evidence of ascites. No ventral hernia. Musculoskeletal: No acute fracture or aggressive appearing lytic or blastic osseous lesion. IMPRESSION: VASCULAR 1. Fusiform infrarenal abdominal aortic aneurysm measuring up to 4.7 cm in diameter. Recommend followup by abdomen and pelvis CTA in 6 months, and vascular surgery referral/consultation if not already obtained. This recommendation follows ACR consensus guidelines: White Paper of the ACR Incidental Findings Committee II on Vascular Findings. J Am Coll Radiol 2013; 10:789-794. Aortic Atherosclerosis (ICD10-170.0); Aortic aneurysm NOS (ICD10-I71.9). 2. Diffuse predominantly calcified atherosclerotic plaque resulting in mild stenosis at the origin of the SMA and moderate stenoses of the bilateral renal artery origins. NON-VASCULAR 1. No acute abnormality within the abdomen or pelvis. 2. Mild fibrotic changes in the periphery of the lower lungs. 3. Porcelain gallbladder wall. 4. Old granulomatous disease in the spleen. 5. Bilateral simple renal cysts. 6. Small fat containing left inguinal hernia. Signed, Criselda Peaches, MD, [facility] Radiology Electronically Signed   By: Jacqulynn Cadet M.D.   On: 12/27/2018 14:24    Assessment/Plan Essential hypertension, benign blood pressure control important in reducing the progression of atherosclerotic diseaseand aneurysmal degeneration. On appropriate oral medications.   Stroke [facility]) Remote, no recent symptoms.  Hyperlipidemia lipid  control important in reducing the progression of atherosclerotic disease. Continue statin therapy   AAA (abdominal aortic aneurysm) without rupture ([place]) I have independently reviewed his CT angiogram performed today.  This demonstrates an approximately 4.6 to 4.7 cm infrarenal abdominal aortic aneurysm.  This is essentially unchanged from his previous study by duplex 5 months ago.  No role for intervention at this level particularly in a nonagenarian.  Continue 32-month follow-up with duplex in our office.    Leotis Pain, MD  12/27/2018 4:08 PM    This note was created with Dragon medical transcription system.  Any errors from dictation are purely unintentional

## 2018-12-27 NOTE — Assessment & Plan Note (Signed)
lipid control important in reducing the progression of atherosclerotic disease. Continue statin therapy  

## 2019-01-28 ENCOUNTER — Other Ambulatory Visit: Payer: Self-pay

## 2019-01-28 ENCOUNTER — Emergency Department: Payer: Medicare Other

## 2019-01-28 ENCOUNTER — Emergency Department
Admission: EM | Admit: 2019-01-28 | Discharge: 2019-01-29 | Disposition: A | Discharge status: Home / Self Care | Payer: Medicare Other | Attending: Student | Admitting: Student

## 2019-01-28 DIAGNOSIS — Z87891 Personal history of nicotine dependence: Secondary | ICD-10-CM | POA: Insufficient documentation

## 2019-01-28 DIAGNOSIS — Z79899 Other long term (current) drug therapy: Secondary | ICD-10-CM | POA: Diagnosis not present

## 2019-01-28 DIAGNOSIS — J449 Chronic obstructive pulmonary disease, unspecified: Secondary | ICD-10-CM | POA: Diagnosis not present

## 2019-01-28 DIAGNOSIS — E860 Dehydration: Secondary | ICD-10-CM | POA: Diagnosis not present

## 2019-01-28 DIAGNOSIS — I10 Essential (primary) hypertension: Secondary | ICD-10-CM | POA: Insufficient documentation

## 2019-01-28 DIAGNOSIS — Z7982 Long term (current) use of aspirin: Secondary | ICD-10-CM | POA: Insufficient documentation

## 2019-01-28 DIAGNOSIS — R9082 White matter disease, unspecified: Secondary | ICD-10-CM | POA: Diagnosis not present

## 2019-01-28 DIAGNOSIS — R443 Hallucinations, unspecified: Secondary | ICD-10-CM

## 2019-01-28 DIAGNOSIS — R441 Visual hallucinations: Secondary | ICD-10-CM | POA: Insufficient documentation

## 2019-01-28 DIAGNOSIS — Z8673 Personal history of transient ischemic attack (TIA), and cerebral infarction without residual deficits: Secondary | ICD-10-CM | POA: Diagnosis not present

## 2019-01-28 LAB — URINALYSIS, COMPLETE (UACMP) WITH MICROSCOPIC
Bacteria, UA: NONE SEEN
Bilirubin Urine: NEGATIVE
Glucose, UA: NEGATIVE mg/dL
Hgb urine dipstick: NEGATIVE
Ketones, ur: NEGATIVE mg/dL
Leukocytes,Ua: NEGATIVE
Nitrite: NEGATIVE
Protein, ur: NEGATIVE mg/dL
Specific Gravity, Urine: 1.017 (ref 1.005–1.030)
pH: 5 (ref 5.0–8.0)

## 2019-01-28 LAB — CBC
HCT: 51.5 % (ref 39.0–52.0)
Hemoglobin: 16.9 g/dL (ref 13.0–17.0)
MCH: 28.4 pg (ref 26.0–34.0)
MCHC: 32.8 g/dL (ref 30.0–36.0)
MCV: 86.6 fL (ref 80.0–100.0)
Platelets: 194 10*3/uL (ref 150–400)
RBC: 5.95 MIL/uL — ABNORMAL HIGH (ref 4.22–5.81)
RDW: 14.6 % (ref 11.5–15.5)
WBC: 10.2 10*3/uL (ref 4.0–10.5)
nRBC: 0 % (ref 0.0–0.2)

## 2019-01-28 LAB — COMPREHENSIVE METABOLIC PANEL
ALT: 23 U/L (ref 0–44)
AST: 28 U/L (ref 15–41)
Albumin: 4.4 g/dL (ref 3.5–5.0)
Alkaline Phosphatase: 69 U/L (ref 38–126)
Anion gap: 12 (ref 5–15)
BUN: 33 mg/dL — ABNORMAL HIGH (ref 8–23)
CO2: 21 mmol/L — ABNORMAL LOW (ref 22–32)
Calcium: 9.9 mg/dL (ref 8.9–10.3)
Chloride: 108 mmol/L (ref 98–111)
Creatinine, Ser: 1.43 mg/dL — ABNORMAL HIGH (ref 0.61–1.24)
GFR calc Af Amer: 50 mL/min — ABNORMAL LOW (ref >60)
GFR calc non Af Amer: 43 mL/min — ABNORMAL LOW (ref >60)
Glucose, Bld: 136 mg/dL — ABNORMAL HIGH (ref 70–99)
Potassium: 4.2 mmol/L (ref 3.5–5.1)
Sodium: 141 mmol/L (ref 135–145)
Total Bilirubin: 1.2 mg/dL (ref 0.3–1.2)
Total Protein: 8 g/dL (ref 6.5–8.1)

## 2019-01-28 LAB — TROPONIN I (HIGH SENSITIVITY): Troponin I (High Sensitivity): 14 ng/L (ref <18)

## 2019-01-28 MED ORDER — SODIUM CHLORIDE 0.9 % IV BOLUS
500.0000 mL | Freq: Once | INTRAVENOUS | Status: AC
  Administered 2019-01-28: 500 mL via INTRAVENOUS

## 2019-01-28 NOTE — ED Triage Notes (Signed)
Patient brought to ED for hallunications. Is seeing his deceased wife who is actually in the room with [place] at this time. He states he sees trucks in his yard and he goes outside to cuss them out and they aren't even there. Patient has no complaint of pain, states his appetite is great. Patient is calm and cooperative at this time. Denies urinary symptoms at this time.

## 2019-01-28 NOTE — ED Provider Notes (Signed)
[facility] Emergency Department Provider Note  ____________________________________________   First MD Initiated Contact with Patient 01/28/19 2137     (approximate)  I have reviewed the triage vital signs and the nursing notes.  History  Chief Complaint Hallucinations    HPI Steve Bryant is a 85 y.o. male with a history of CVA who presents to the emergency department for hallucinations.  Patient has apparently been experiencing visual hallucinations for several months up to a year.  Initially, he was only seeing things at nighttime, but over the last several weeks has started to have daytime hallucinations.  Over the last few days, they have seemed more prevalent/severe per the granddaughter, prompting evaluation.  Patient is hallucinating seeing his deceased wife, and is also hallucinating trucks in his yard that are not there.  He does not seem particularly bothered by them.  He reports compliance with his medications and denies any new medications or over-the-counter medications.  He denies any recent illnesses.  No fevers, nausea, vomiting, chest pain, abdominal pain, dysuria.   Past Medical Hx Past Medical History:  Diagnosis Date  . AAA (abdominal aortic aneurysm) ([place])   . Arthritis   . Glaucoma   . Hyperlipidemia   . Hypertension   . Stroke [facility])     Problem List Patient Active Problem List   Diagnosis Date Noted  . Hyperlipidemia 11/04/2016  . CVA (cerebral vascular accident) ([place]) 10/15/2016  . Essential hypertension, benign 09/18/2016  . Stroke ([employer]) 09/18/2016  . AAA (abdominal aortic aneurysm) without rupture ([place]) 09/18/2016  . Gait instability 08/26/2016  . History of COPD 11/22/2013  . SSS (sick sinus syndrome) ([place]) 11/22/2013  . DDD (degenerative disc disease), lumbar 10/02/2013  . Lumbar radiculitis 10/02/2013  . Arthritis 05/18/2013  . Asthma 05/18/2013  . Degenerative joint disease of cervical and lumbar spine  05/18/2013  . Idiopathic peripheral neuropathy 05/18/2013  . Renal insufficiency 05/18/2013    Past Surgical Hx Past Surgical History:  Procedure Laterality Date  . EYE SURGERY    . neck fusion    . TUMOR REMOVAL      Medications Prior to Admission medications   Medication Sig Start Date End Date Taking? Authorizing Provider  amLODipine (NORVASC) 2.5 MG tablet Take 2.5 mg by mouth daily. 10/06/16  Yes [provider]  aspirin EC 81 MG tablet Take by mouth.   Yes [provider]  clopidogrel (PLAVIX) 75 MG tablet Take 75 mg by mouth daily. 06/09/18  Yes [provider]  dorzolamide (TRUSOPT) 2 % ophthalmic solution INSTILL ONE DROP TO BOTH EYES TWICE DAILY 10/18/14  Yes [provider]  dorzolamide-timolol (COSOPT) 22.3-6.8 MG/ML ophthalmic solution INSTILL ONE DROP TWICE DAILY TO BOTH EYES 08/10/16  Yes [provider]  latanoprost (XALATAN) 0.005 % ophthalmic solution USE 1 DROP IN THE RIGHT EYE AT BEDTIME AS DIRECTED 09/03/16  Yes [provider]  losartan (COZAAR) 25 MG tablet Take 1 tablet (25 mg total) by mouth daily. 10/16/16  Yes Gladstone Lighter, MD  LYRICA 75 MG capsule Take 75 mg by mouth 3 (three) times daily. 07/10/16  Yes [provider]  Melatonin 10 MG TABS Take 1 tablet by mouth at bedtime.   Yes [provider]  Multiple Vitamin (MULTIVITAMIN) tablet Take 1 tablet by mouth daily.   Yes [provider]  omeprazole (PRILOSEC) 20 MG capsule Take 20 mg by mouth daily.   Yes [provider]  ROCKLATAN 0.02-0.005 % SOLN INSTILL 1 DROP  IN THE RIGHT EYE ONCE IN THE EVENING 11/05/17  Yes [provider]  rosuvastatin (CRESTOR) 5 MG tablet Take 5 mg by mouth daily.   Yes [provider]    Allergies Shellfish-derived products  Family Hx Family History  Problem Relation Age of Onset  . Stroke Mother   . Stroke Father   . Cancer Brother     Social Hx Social History    Tobacco Use  . Smoking status: Former Research scientist (life sciences)  . Smokeless tobacco: Never Used  Substance Use Topics  . Alcohol use: Not Currently    Comment: occasionally  . Drug use: No     Review of Systems  Constitutional: Negative for fever, chills. Eyes: Negative for visual changes. ENT: Negative for sore throat. Cardiovascular: Negative for chest pain. Respiratory: Negative for shortness of breath. Gastrointestinal: Negative for nausea, vomiting.  Genitourinary: Negative for dysuria. Musculoskeletal: Negative for leg swelling. Skin: Negative for rash. Neurological: Negative for for headaches. + hallucinations   Physical Exam  Vital Signs: ED Triage Vitals  Enc Vitals Group     BP 01/28/19 2113 (!) 152/78     Pulse Rate 01/28/19 2113 73     Resp 01/28/19 2113 20     Temp 01/28/19 2113 98.1 F (36.7 C)     Temp Source 01/28/19 2113 Oral     SpO2 01/28/19 2113 98 %     Weight 01/28/19 2120 158 lb (71.7 kg)     Height 01/28/19 2120 5\' 9"  (1.753 m)     Head Circumference --      Peak Flow --      Pain Score 01/28/19 2120 0     Pain Loc --      Pain Edu? --      Excl. in [place]? --     Constitutional: Alert and oriented.  Head: Normocephalic. Atraumatic. Eyes: Conjunctivae clear. Sclera anicteric. Nose: No congestion. No rhinorrhea. Mouth/Throat: Wearing mask.  Neck: No stridor.   Cardiovascular: Normal rate, regular rhythm. Extremities well perfused. Respiratory: Normal respiratory effort.  Lungs CTAB. Gastrointestinal: Soft. Non-tender. Non-distended.  Musculoskeletal: No lower extremity edema. No deformities. Neurologic:  Normal speech and language. No gross focal neurologic deficits are appreciated.  Moves all extremities equally. UE and LE strength 5/5 and symmetric.  SILT. Skin: Skin is warm, dry and intact. No rash noted. Psychiatric: Mood and affect are appropriate for situation.  States he can see his wife in the room.  Does not seem particularly bothered by this.   EKG  Personally reviewed.   Rate: 69 Rhythm: sinus Axis: normal Intervals: 1st degree AV block No acute ischemic changes No STEMI    Radiology  CT: IMPRESSION:  1. No CT evidence for acute intracranial abnormality.  2. Atrophy and small vessel ischemic changes of the white matter.  Multifocal chronic infarcts involving the cerebellum, right parietal  lobe, left thalamus and pons.    Procedures  Procedure(s) performed (including critical care):  Procedures   Initial Impression / Assessment and Plan / ED Course  85 y.o. male who presents to the ED for hallucinations, as above  Ddx: CVA/intracranial abnormality, sequale of prior CVA, dehydration, UTI, electrolyte abnormality, dementia with visual disturbances  Will obtain labs, urine, CT imaging. If work up negative, given patient is not bothered by hallucinations, would consider discharge with outpatient follow up.   Final Clinical Impression(s) / ED Diagnosis  Final diagnoses:  Hallucinations  AKI (acute kidney injury) ([place])       Note:  This document was prepared using Dragon voice recognition software and may include unintentional dictation errors.   Lilia Pro., MD 01/28/19 [phone]

## 2019-01-28 NOTE — ED Notes (Addendum)
Pt with granddaughter c/o visual hallucinations   meds reviewed no changes

## 2019-01-29 ENCOUNTER — Other Ambulatory Visit: Payer: Self-pay

## 2019-01-29 NOTE — ED Provider Notes (Signed)
UA without signs concerning for infection. Patient was given IV fluids here. Discussed importance of continued work up with PCP/neurology.    Nance Pear, MD 01/29/19 [phone]

## 2019-01-29 NOTE — ED Notes (Signed)
Peripheral IV discontinued. Catheter intact. No signs of infiltration or redness. Gauze applied to IV site.   Discharge instructions reviewed with patient. Questions fielded by this RN. Patient verbalizes understanding of instructions. Patient discharged home in stable condition per [place]. No acute distress noted at time of discharge.

## 2019-01-29 NOTE — Discharge Instructions (Signed)
Please follow up with your doctor/neurology. Please seek medical attention for any high fevers, chest pain, shortness of breath, change in behavior, persistent vomiting, bloody stool or any other new or concerning symptoms.

## 2019-01-31 ENCOUNTER — Ambulatory Visit (INDEPENDENT_AMBULATORY_CARE_PROVIDER_SITE_OTHER): Payer: Medicare Other | Admitting: Vascular Surgery

## 2019-01-31 ENCOUNTER — Other Ambulatory Visit (INDEPENDENT_AMBULATORY_CARE_PROVIDER_SITE_OTHER): Payer: Medicare Other

## 2019-02-09 DIAGNOSIS — R441 Visual hallucinations: Secondary | ICD-10-CM | POA: Insufficient documentation

## 2019-06-15 ENCOUNTER — Ambulatory Visit (INDEPENDENT_AMBULATORY_CARE_PROVIDER_SITE_OTHER): Payer: Medicare Other | Admitting: Dermatology

## 2019-06-15 ENCOUNTER — Other Ambulatory Visit: Payer: Self-pay

## 2019-06-15 DIAGNOSIS — C44729 Squamous cell carcinoma of skin of left lower limb, including hip: Secondary | ICD-10-CM

## 2019-06-15 DIAGNOSIS — C44721 Squamous cell carcinoma of skin of unspecified lower limb, including hip: Secondary | ICD-10-CM

## 2019-06-15 DIAGNOSIS — D1801 Hemangioma of skin and subcutaneous tissue: Secondary | ICD-10-CM | POA: Diagnosis not present

## 2019-06-15 DIAGNOSIS — C44722 Squamous cell carcinoma of skin of right lower limb, including hip: Secondary | ICD-10-CM

## 2019-06-15 DIAGNOSIS — C4492 Squamous cell carcinoma of skin, unspecified: Secondary | ICD-10-CM

## 2019-06-15 DIAGNOSIS — D492 Neoplasm of unspecified behavior of bone, soft tissue, and skin: Secondary | ICD-10-CM

## 2019-06-15 DIAGNOSIS — L570 Actinic keratosis: Secondary | ICD-10-CM

## 2019-06-15 DIAGNOSIS — L820 Inflamed seborrheic keratosis: Secondary | ICD-10-CM | POA: Diagnosis not present

## 2019-06-15 DIAGNOSIS — L578 Other skin changes due to chronic exposure to nonionizing radiation: Secondary | ICD-10-CM | POA: Diagnosis not present

## 2019-06-15 DIAGNOSIS — R238 Other skin changes: Secondary | ICD-10-CM

## 2019-06-15 HISTORY — DX: Squamous cell carcinoma of skin, unspecified: C44.92

## 2019-06-15 NOTE — Patient Instructions (Signed)

## 2019-06-15 NOTE — Progress Notes (Signed)
New Patient Visit  Subjective  Steve Bryant is a 86 y.o. male who presents for the following: growth (R calf x 59m, hx of bleeding, no pain, improving per pt) and bump (upper lip x 56yr). He also has other spots of concern.  Some spots are rough and he tends to pick at them.  He did not notice a pink knot on his leg  Objective  Well appearing patient in no apparent distress; mood and affect are within normal limits.  A focused examination was performed including face, arms, legs. Relevant physical exam findings are noted in the Assessment and Plan.  Objective  bil hands/arms >20 (20): Pink scaly macules   Objective  arms x 2 (2): Erythematous keratotic or waxy stuck-on papule or plaque.   Objective  L distal medial pretibial: 1.5cm hyperkeratotic pink pap  Objective  R calf: Crusted pap 0.8cm  Objective  L upper lip: Venous lake, Compresses by palpation  Images    Assessment & Plan    Actinic Damage - diffuse scaly erythematous macules with underlying dyspigmentation - Recommend daily broad spectrum sunscreen SPF 30+ to sun-exposed areas, reapply every 2 hours as needed.  - Call for new or changing lesions.  AK (actinic keratosis) (20) bil hands/arms >20  Destruction of lesion - bil hands/arms >20 Complexity: simple   Destruction method: cryotherapy   Informed consent: discussed and consent obtained   Timeout:  patient name, date of birth, surgical site, and procedure verified Lesion destroyed using liquid nitrogen: Yes   Region frozen until ice ball extended beyond lesion: Yes   Outcome: patient tolerated procedure well with no complications   Post-procedure details: wound care instructions given    Inflamed seborrheic keratosis (2) arms x 2  Destruction of lesion - arms x 2 Complexity: simple   Destruction method: cryotherapy   Informed consent: discussed and consent obtained   Timeout:  patient name, date of birth, surgical site, and procedure  verified Lesion destroyed using liquid nitrogen: Yes   Region frozen until ice ball extended beyond lesion: Yes   Outcome: patient tolerated procedure well with no complications   Post-procedure details: wound care instructions given    Neoplasm of skin (2) L distal medial pretibial  Epidermal / dermal shaving  Lesion length (cm):  1.5 Lesion width (cm):  1.5 Margin per side (cm):  0.2 Total excision diameter (cm):  1.9 Informed consent: discussed and consent obtained   Timeout: patient name, date of birth, surgical site, and procedure verified   Procedure prep:  Patient was prepped and draped in usual sterile fashion Prep type:  Isopropyl alcohol Anesthesia: the lesion was anesthetized in a standard fashion   Anesthetic:  1% lidocaine w/ epinephrine 1-100,000 buffered w/ 8.4% NaHCO3 Instrument used: flexible razor blade   Hemostasis achieved with: pressure, aluminum chloride and electrodesiccation   Outcome: patient tolerated procedure well   Post-procedure details: sterile dressing applied and wound care instructions given   Dressing type: bandage and petrolatum    Destruction of lesion Complexity: extensive   Destruction method: electrodesiccation and curettage   Informed consent: discussed and consent obtained   Timeout:  patient name, date of birth, surgical site, and procedure verified Procedure prep:  Patient was prepped and draped in usual sterile fashion Prep type:  Isopropyl alcohol Anesthesia: the lesion was anesthetized in a standard fashion   Anesthetic:  1% lidocaine w/ epinephrine 1-100,000 buffered w/ 8.4% NaHCO3 Curettage performed in three different directions: Yes   Electrodesiccation performed  over the curetted area: Yes   Lesion length (cm):  1.5 Lesion width (cm):  1.5 Margin per side (cm):  0.2 Final wound size (cm):  1.9 Hemostasis achieved with:  pressure, aluminum chloride and electrodesiccation Outcome: patient tolerated procedure well with no  complications   Post-procedure details: sterile dressing applied and wound care instructions given   Dressing type: bandage and petrolatum    Specimen 1 - Surgical pathology Differential Diagnosis: D48.5 R/O SCC Check Margins: No 1.5cm hyperkeratotic pink pap EDC today  R calf  Epidermal / dermal shaving  Lesion length (cm):  0.8 Lesion width (cm):  0.8 Margin per side (cm):  0.2 Total excision diameter (cm):  1.2 Informed consent: discussed and consent obtained   Timeout: patient name, date of birth, surgical site, and procedure verified   Procedure prep:  Patient was prepped and draped in usual sterile fashion Prep type:  Isopropyl alcohol Anesthesia: the lesion was anesthetized in a standard fashion   Anesthetic:  1% lidocaine w/ epinephrine 1-100,000 buffered w/ 8.4% NaHCO3 Instrument used: flexible razor blade   Hemostasis achieved with: pressure, aluminum chloride and electrodesiccation   Outcome: patient tolerated procedure well   Post-procedure details: sterile dressing applied and wound care instructions given   Dressing type: bandage and petrolatum    Destruction of lesion Complexity: extensive   Destruction method: electrodesiccation and curettage   Informed consent: discussed and consent obtained   Timeout:  patient name, date of birth, surgical site, and procedure verified Procedure prep:  Patient was prepped and draped in usual sterile fashion Prep type:  Isopropyl alcohol Anesthesia: the lesion was anesthetized in a standard fashion   Anesthetic:  1% lidocaine w/ epinephrine 1-100,000 buffered w/ 8.4% NaHCO3 Curettage performed in three different directions: Yes   Electrodesiccation performed over the curetted area: Yes   Lesion length (cm):  0.8 Lesion width (cm):  0.8 Margin per side (cm):  0.2 Final wound size (cm):  1.2 Hemostasis achieved with:  pressure, aluminum chloride and electrodesiccation Outcome: patient tolerated procedure well with no  complications   Post-procedure details: sterile dressing applied and wound care instructions given   Dressing type: bandage and petrolatum    Specimen 2 - Surgical pathology Differential Diagnosis: D48.5 R/O SCC Check Margins: No Crusted pap 0.8cm EDC today  Venous lake L upper lip  Discussed benign in nature and discussed laser txt $200 per txt session  Return in about 8 weeks (around 08/10/2019) for recheck AK, ISKs and bx sites.   I, Othelia Pulling, RMA, am acting as scribe for Sarina Ser, MD . Documentation: I have reviewed the above documentation for accuracy and completeness, and I agree with the above.  Sarina Ser, MD

## 2019-06-16 ENCOUNTER — Encounter: Payer: Self-pay | Admitting: Dermatology

## 2019-06-19 ENCOUNTER — Telehealth: Payer: Self-pay

## 2019-06-19 NOTE — Telephone Encounter (Signed)
Discussed biopsy results with pt daughter

## 2019-06-19 NOTE — Telephone Encounter (Signed)
Biopsy results discussed  

## 2019-06-27 ENCOUNTER — Other Ambulatory Visit (INDEPENDENT_AMBULATORY_CARE_PROVIDER_SITE_OTHER): Payer: Medicare Other

## 2019-06-27 ENCOUNTER — Ambulatory Visit (INDEPENDENT_AMBULATORY_CARE_PROVIDER_SITE_OTHER): Payer: Medicare Other | Admitting: Vascular Surgery

## 2019-07-04 ENCOUNTER — Ambulatory Visit (INDEPENDENT_AMBULATORY_CARE_PROVIDER_SITE_OTHER): Payer: Medicare Other

## 2019-07-04 ENCOUNTER — Other Ambulatory Visit: Payer: Self-pay

## 2019-07-04 ENCOUNTER — Encounter (INDEPENDENT_AMBULATORY_CARE_PROVIDER_SITE_OTHER): Payer: Self-pay | Admitting: Vascular Surgery

## 2019-07-04 ENCOUNTER — Ambulatory Visit (INDEPENDENT_AMBULATORY_CARE_PROVIDER_SITE_OTHER): Payer: Medicare Other | Admitting: Vascular Surgery

## 2019-07-04 VITALS — BP 149/76 | HR 52 | Ht 70.0 in | Wt 158.0 lb

## 2019-07-04 DIAGNOSIS — I714 Abdominal aortic aneurysm, without rupture, unspecified: Secondary | ICD-10-CM

## 2019-07-04 DIAGNOSIS — I713 Abdominal aortic aneurysm, ruptured, unspecified: Secondary | ICD-10-CM

## 2019-07-04 DIAGNOSIS — E785 Hyperlipidemia, unspecified: Secondary | ICD-10-CM

## 2019-07-04 DIAGNOSIS — I10 Essential (primary) hypertension: Secondary | ICD-10-CM | POA: Diagnosis not present

## 2019-07-04 DIAGNOSIS — I639 Cerebral infarction, unspecified: Secondary | ICD-10-CM | POA: Diagnosis not present

## 2019-07-04 NOTE — Patient Instructions (Signed)
Abdominal Aortic Aneurysm Endograft Repair  Abdominal aortic aneurysm endograft repair is a surgery to fix an aortic aneurysm in the abdominal area. An aneurysm is a weak or damaged part of an artery wall that bulges out from the normal force of blood pumping through the body. An abdominal aortic aneurysm is an aneurysm that happens in the lower part of the aorta, which is the main artery of the body. The repair is often done if the aneurysm gets so large that it might burst (rupture). A ruptured aneurysm would cause bleeding inside the body that could put a person's life in danger. Before that happens, this procedure is needed to fix the problem. The procedure may also be done if the aneurysm causes symptoms such as pain in the back, abdomen, or side. In this procedure, a tube made of fabric and metal mesh (endograft or stent-graft) is placed in the weak part of the aorta to repair it. Tell a health care provider about:  Any allergies you have.  All medicines you are taking, including vitamins, herbs, eye drops, creams, and over-the-counter medicines.  Any problems you or family members have had with anesthetic medicines.  Any blood disorders you have.  Any surgeries you have had.  Any medical conditions you have.  Whether you are pregnant or may be pregnant. What are the risks? Generally, this is a safe procedure. However, problems may occur, including:  Infection of the graft or incision area.  Bleeding during the procedure or from the incision site.  Allergic reactions to medicines.  Damage to other structures or organs.  Blood leaking out around the endograft.  The endograft moving from where it was placed during surgery.  Blood flow through the graft becoming blocked.  Blood clots.  Kidney problems.  Blood flow to the legs becoming blocked (rare).  Rupture of the aorta even after the endograft repair is a success (rare). What happens before the procedure? Staying  hydrated Follow instructions from your health care provider about hydration, which may include:  Up to 2 hours before the procedure - you may continue to drink clear liquids, such as water, clear fruit juice, black coffee, and plain tea. Eating and drinking restrictions Follow instructions from your health care provider about eating and drinking, which may include:  8 hours before the procedure - stop eating heavy meals or foods such as meat, fried foods, or fatty foods.  6 hours before the procedure - stop eating light meals or foods, such as toast or cereal.  6 hours before the procedure - stop drinking milk or drinks that contain milk.  2 hours before the procedure - stop drinking clear liquids. Medicines  Ask your health care provider about: ? Changing or stopping your regular medicines. This is especially important if you are taking diabetes medicines or blood thinners. ? Taking medicines such as aspirin and ibuprofen. These medicines can thin your blood. Do not take these medicines before your procedure if your health care provider instructs you not to.  You may be given antibiotic medicine to help prevent infection. General instructions  You may need to have blood tests, a test to check heart rhythm (electrocardiogram, or ECG), or a test to check blood flow (angiogram) before the surgery.  Imaging tests will be done to check the size and location of the aneurysm. These tests could include an ultrasound, a CT scan, or an MRI.  Do not use any products that contain nicotine or tobacco--such as cigarettes and e-cigarettes--for as   long as possible before the surgery. If you need help quitting, ask your health care provider.  Ask your health care provider how your surgical site will be marked or identified.  Plan to have someone take you home from the hospital or clinic. What happens during the procedure?  To reduce your risk of infection: ? Your health care team will wash or  sanitize their hands. ? Your skin will be washed with soap. ? Hair may be removed from the surgical area.  An IV tube will be inserted into one of your veins.  You will be given one or more of the following: ? A medicine to help you relax (sedative). ? A medicine to numb the area (local anesthetic). ? A medicine to make you fall asleep (general anesthetic). ? A medicine that is injected into an area of your body to numb everything below the injection site (regional anesthetic).  During the surgery: ? Small incisions or a puncture will be made on one or both sides of the groin. Long, thin tubes (catheters) will be passed through the opening, put into the artery in your thigh, and moved up into the aneurysm in the aorta. ? The health care provider will use live X-ray pictures to guide the endograft through the catheterto the place where the aneurysm is. ? The endograft will be released to seal off the aneurysm and to line the aorta. It will keep blood from flowing into the aneurysm and will help keep it from rupturing. The endograft will stay in place and will not be taken out. ? X-rays will be used to check where the endograft is placed and to make sure that it is where it should be. ? The catheter will be taken out, and the incision will be closed with stitches (sutures). The procedure may vary among health care providers and hospitals. What happens after the procedure?  Your blood pressure, heart rate, breathing rate, and blood oxygen level will be monitored until the medicines you were given have worn off.  You will need to lie flat for a number of hours. Bending your legs can cause them to bleed and swell.  You will then be urged to get up and move around a number of times each day and to slowly become more active.  You will be given medicines to control pain.  Certain tests may be done after your procedure to check how well the endograft is working and to check its placement.  Do  not drive for 24 hours if you received a sedative. This information is not intended to replace advice given to you by your health care provider. Make sure you discuss any questions you have with your health care provider. Document Revised: 02/05/2017 Document Reviewed: 05/20/2015 Elsevier Patient Education  2020 [employer]  

## 2019-07-04 NOTE — Progress Notes (Signed)
MRN : [id number]  Steve Bryant is a 86 y.o. ([id number]) male who presents with chief complaint of  Chief Complaint  Patient presents with  . Follow-up    6 mo AAA   .  History of Present Illness: Patient returns today in follow up of his AAA.  He is doing well today without any obvious aneurysm related symptoms. Specifically, the patient denies new back or abdominal pain, or signs of peripheral embolization.  He does have some chronic right lower back pain after a fall last year but this is fairly mild at this point.  His duplex today does show growth of his abdominal aortic aneurysm now measuring 4.99 cm in maximal diameter by duplex.  This was 4.7 cm on the CT scan about 6 months ago.   Current Outpatient Medications  Medication Sig Dispense Refill  . amLODipine (NORVASC) 2.5 MG tablet Take 2.5 mg by mouth daily.  2  . aspirin EC 81 MG tablet Take by mouth.    . clopidogrel (PLAVIX) 75 MG tablet Take 75 mg by mouth daily.    . dorzolamide (TRUSOPT) 2 % ophthalmic solution INSTILL ONE DROP TO BOTH EYES TWICE DAILY    . dorzolamide-timolol (COSOPT) 22.3-6.8 MG/ML ophthalmic solution INSTILL ONE DROP TWICE DAILY TO BOTH EYES  2  . gabapentin (NEURONTIN) 100 MG capsule Take one capsule ( 100 mg) in the morning and two capsules ( 200 mg) at night.    . losartan (COZAAR) 25 MG tablet Take 1 tablet (25 mg total) by mouth daily. 30 tablet 2  . LYRICA 75 MG capsule Take 75 mg by mouth 3 (three) times daily.  0  . Multiple Vitamin (MULTIVITAMIN) tablet Take 1 tablet by mouth daily.    . pregabalin (LYRICA) 75 MG capsule TAKE 1 CAPSULE BY MOUTH 3 TIMES A DAY    . latanoprost (XALATAN) 0.005 % ophthalmic solution USE 1 DROP IN THE RIGHT EYE AT BEDTIME AS DIRECTED  2  . Melatonin 10 MG TABS Take 1 tablet by mouth at bedtime.    [name] omeprazole (PRILOSEC) 20 MG capsule Take 20 mg by mouth daily.    . risperiDONE (RISPERDAL) 0.5 MG tablet TAKE ONE TABLET (0.5 MG) AT DINNER AND THEN TAKE ANOTHER  TABLET (0.5 MG) AT BEDTIME.    [name] ROCKLATAN 0.02-0.005 % SOLN INSTILL 1 DROP IN THE RIGHT EYE ONCE IN THE EVENING  5  . rosuvastatin (CRESTOR) 5 MG tablet Take 5 mg by mouth daily.     No current facility-administered medications for this visit.    Past Medical History:  Diagnosis Date  . AAA (abdominal aortic aneurysm) ([place])   . Arthritis   . Glaucoma   . Hyperlipidemia   . Hypertension   . Stroke [facility])     Past Surgical History:  Procedure Laterality Date  . EYE SURGERY    . neck fusion    . TUMOR REMOVAL       Social History   Tobacco Use  . Smoking status: Former Research scientist (life sciences)  . Smokeless tobacco: Never Used  Substance Use Topics  . Alcohol use: Not Currently    Comment: occasionally  . Drug use: No       Family History  Problem Relation Age of Onset  . Stroke Mother   . Stroke Father   . Cancer Brother      Allergies  Allergen Reactions  . Shellfish-Derived Products Nausea Only    Scallops    REVIEW OF SYSTEMS(Negative  unless checked)  Constitutional: [] ???Weight loss[] ???Fever[] ???Chills Cardiac:[] ???Chest pain[] ???Chest pressure[] ???Palpitations [] ???Shortness of breath when laying flat [] ???Shortness of breath at rest [] ???Shortness of breath with exertion. Vascular: [] ???Pain in legs with walking[] ???Pain in legsat rest[] ???Pain in legs when laying flat [] ???Claudication [] ???Pain in feet when walking [] ???Pain in feet at rest [] ???Pain in feet when laying flat [] ???History of DVT [] ???Phlebitis [] ???Swelling in legs [] ???Varicose veins [] ???Non-healing ulcers Pulmonary: [] ???Uses home oxygen [] ???Productive cough[] ???Hemoptysis [] ???Wheeze [] ???COPD [] ???Asthma Neurologic: [] ???Dizziness [] ???Blackouts [] ???Seizures [x] ???History of stroke [] ???History of TIA[] ???Aphasia [] ???Temporary blindness[] ???Dysphagia [] ???Weaknessor numbness in arms [] ???Weakness or numbnessin  legs Musculoskeletal: [x] ???Arthritis [] ???Joint swelling [] ???Joint pain [] ???Low back pain Hematologic:[] ???Easy bruising[] ???Easy bleeding [] ???Hypercoagulable state [] ???Anemic  Gastrointestinal:[] ???Blood in stool[] ???Vomiting blood[x] ???Gastroesophageal reflux/heartburn[] ???Abdominal pain Genitourinary: [] ???Chronic kidney disease [] ???Difficulturination [] ???Frequenturination [] ???Burning with urination[] ???Hematuria Skin: [] ???Rashes [] ???Ulcers [] ???Wounds Psychological: [] ???History of anxiety[] ???History of major depression.  Physical Examination  BP (!) 149/76   Pulse (!) 52   Ht 5\' 10"  (1.778 m)   Wt 158 lb (71.7 kg)   BMI 22.67 kg/m  Gen:  WD/WN, NAD.  Appears younger than stated age Head: /AT, No temporalis wasting. Ear/Nose/Throat: Hearing grossly intact, nares w/o erythema or drainage Eyes: Conjunctiva clear. Sclera non-icteric Neck: Supple.  Trachea midline Pulmonary:  Good air movement, no use of accessory muscles.  Cardiac: Irregular Vascular:  Vessel Right Left  Radial Palpable Palpable                          PT Palpable Palpable  DP Palpable Palpable   Gastrointestinal: soft, non-tender/non-distended.  Increased aortic impulse Musculoskeletal: M/S 5/5 throughout.  No deformity or atrophy.  Trace lower extremity edema.  Walks with a walker Neurologic: Sensation grossly intact in extremities.  Symmetrical.  Speech is fluent.  Psychiatric: Judgment intact, Mood & affect appropriate for pt's clinical situation. Dermatologic: No rashes or ulcers noted.  No cellulitis or open wounds.       Labs No results found for this or any previous visit (from the past 2160 hour(s)).  Radiology No results found.  Assessment/Plan Essential hypertension, benign blood pressure control important in reducing the progression of atherosclerotic diseaseand aneurysmal degeneration. On appropriate oral  medications.   Stroke [facility]) Remote, no recent symptoms.  Hyperlipidemia lipid control important in reducing the progression of atherosclerotic disease. Continue statin therapy  AAA (abdominal aortic aneurysm) without rupture (HCC) His duplex today does show growth of his abdominal aortic aneurysm now measuring 4.99 cm in maximal diameter by duplex.  This was 4.7 cm on the CT scan about 6 months ago.  This is a somewhat more complex situation given his advanced age and other comorbidities.  His aneurysm is certainly approaching 5 cm, and may be there at this point.  We discussed 2 options.  One would be proceeding with repair since he had a CT scan 6 months ago and the other would be a short interval follow-up of about 3 months with another CT scan to assess the aneurysm for specific size as well as planning repair.  I would prefer the latter given his age, and he and his family are in agreement with this.  We will plan to see him back in 3 months with a CTA of the abdomen and pelvis.    Leotis Pain, MD  07/04/2019 11:52 AM    This note was created with Dragon medical transcription system.  Any errors from dictation are purely unintentional

## 2019-07-04 NOTE — Assessment & Plan Note (Signed)
His duplex today does show growth of his abdominal aortic aneurysm now measuring 4.99 cm in maximal diameter by duplex.  This was 4.7 cm on the CT scan about 6 months ago.  This is a somewhat more complex situation given his advanced age and other comorbidities.  His aneurysm is certainly approaching 5 cm, and may be there at this point.  We discussed 2 options.  One would be proceeding with repair since he had a CT scan 6 months ago and the other would be a short interval follow-up of about 3 months with another CT scan to assess the aneurysm for specific size as well as planning repair.  I would prefer the latter given his age, and he and his family are in agreement with this.  We will plan to see him back in 3 months with a CTA of the abdomen and pelvis.

## 2019-08-28 ENCOUNTER — Other Ambulatory Visit: Payer: Self-pay

## 2019-08-28 ENCOUNTER — Ambulatory Visit (INDEPENDENT_AMBULATORY_CARE_PROVIDER_SITE_OTHER): Payer: Medicare Other | Admitting: Dermatology

## 2019-08-28 DIAGNOSIS — Z85828 Personal history of other malignant neoplasm of skin: Secondary | ICD-10-CM

## 2019-08-28 DIAGNOSIS — I639 Cerebral infarction, unspecified: Secondary | ICD-10-CM | POA: Diagnosis not present

## 2019-08-28 DIAGNOSIS — L820 Inflamed seborrheic keratosis: Secondary | ICD-10-CM

## 2019-08-28 DIAGNOSIS — L570 Actinic keratosis: Secondary | ICD-10-CM

## 2019-08-28 DIAGNOSIS — L578 Other skin changes due to chronic exposure to nonionizing radiation: Secondary | ICD-10-CM

## 2019-08-28 NOTE — Progress Notes (Signed)
   Follow-Up Visit   Subjective  Steve Bryant is a 86 y.o. male who presents for the following: Skin Cancer (2 months f/u SCC removed 06/15/2019 L distal medial pretibial, R calf).  Granddaughter with pt and contributes to history.  The following portions of the chart were reviewed this encounter and updated as appropriate:  Tobacco  Allergies  Meds  Problems  Med Hx  Surg Hx  Fam Hx      Review of Systems:  No other skin or systemic complaints except as noted in HPI or Assessment and Plan.  Objective  Well appearing patient in no apparent distress; mood and affect are within normal limits.  A focused examination was performed including face, neck, chest and back and face, arms, legs . Relevant physical exam findings are noted in the Assessment and Plan.  Objective  Left distal medial pretibial, Right calf: Well healed scar with no evidence of recurrence, no lymphadenopathy.   Objective  Head - Anterior (Face) (19): Erythematous thin papules/macules with gritty scale.   Objective  Right Lower Leg - Anterior (5): Erythematous keratotic or waxy stuck-on papule or plaque.    Assessment & Plan    History of SCC (squamous cell carcinoma) of skin Left distal medial pretibial, Right calf  Clear. Observe for recurrence. Call clinic for new or changing lesions.  Recommend regular skin exams, daily broad-spectrum spf 30+ sunscreen use, and photoprotection.     AK (actinic keratosis) (19) Head - Anterior (Face)  Destruction of lesion - Head - Anterior (Face) Complexity: simple   Destruction method: cryotherapy   Informed consent: discussed and consent obtained   Timeout:  patient name, date of birth, surgical site, and procedure verified Lesion destroyed using liquid nitrogen: Yes   Region frozen until ice ball extended beyond lesion: Yes   Outcome: patient tolerated procedure well with no complications   Post-procedure details: wound care instructions given     Inflamed seborrheic keratosis (5) Right Lower Leg - Anterior  Destruction of lesion - Right Lower Leg - Anterior Complexity: simple   Destruction method: cryotherapy   Informed consent: discussed and consent obtained   Timeout:  patient name, date of birth, surgical site, and procedure verified Lesion destroyed using liquid nitrogen: Yes   Region frozen until ice ball extended beyond lesion: Yes   Outcome: patient tolerated procedure well with no complications   Post-procedure details: wound care instructions given    Actinic Damage - diffuse scaly erythematous macules with underlying dyspigmentation - Recommend daily broad spectrum sunscreen SPF 30+ to sun-exposed areas, reapply every 2 hours as needed.  - Call for new or changing lesions.  Return in about 2 months (around 10/28/2019) for Aks . IMarye Round, CMA, am acting as scribe for Sarina Ser, MD .  Documentation: I have reviewed the above documentation for accuracy and completeness, and I agree with the above.  Sarina Ser, MD

## 2019-08-28 NOTE — Patient Instructions (Signed)
Cryotherapy Aftercare  . Wash gently with soap and water everyday.   . Apply Vaseline and Band-Aid daily until healed.  

## 2019-08-30 ENCOUNTER — Encounter: Payer: Self-pay | Admitting: Dermatology

## 2019-09-28 DIAGNOSIS — M160 Bilateral primary osteoarthritis of hip: Secondary | ICD-10-CM | POA: Insufficient documentation

## 2019-10-30 ENCOUNTER — Ambulatory Visit: Payer: Medicare Other | Admitting: Dermatology

## 2019-11-03 ENCOUNTER — Ambulatory Visit
Admission: RE | Admit: 2019-11-03 | Discharge: 2019-11-03 | Disposition: A | Discharge status: Home / Self Care | Payer: Medicare Other | Source: Ambulatory Visit | Attending: Vascular Surgery | Admitting: Vascular Surgery

## 2019-11-03 ENCOUNTER — Other Ambulatory Visit: Payer: Self-pay

## 2019-11-03 DIAGNOSIS — I713 Abdominal aortic aneurysm, ruptured, unspecified: Secondary | ICD-10-CM

## 2019-11-03 LAB — POCT I-STAT CREATININE: Creatinine, Ser: 1.4 mg/dL — ABNORMAL HIGH (ref 0.61–1.24)

## 2019-11-03 MED ORDER — IOHEXOL 350 MG/ML SOLN
75.0000 mL | Freq: Once | INTRAVENOUS | Status: AC | PRN
  Administered 2019-11-03: 75 mL via INTRAVENOUS

## 2020-04-15 ENCOUNTER — Observation Stay: Payer: Medicare Other

## 2020-04-15 ENCOUNTER — Observation Stay
Admission: EM | Admit: 2020-04-15 | Discharge: 2020-04-16 | Disposition: A | Discharge status: Home / Self Care | Payer: Medicare Other | Attending: Internal Medicine | Admitting: Internal Medicine

## 2020-04-15 ENCOUNTER — Other Ambulatory Visit: Payer: Self-pay

## 2020-04-15 ENCOUNTER — Emergency Department: Payer: Medicare Other

## 2020-04-15 DIAGNOSIS — G9009 Other idiopathic peripheral autonomic neuropathy: Secondary | ICD-10-CM | POA: Diagnosis not present

## 2020-04-15 DIAGNOSIS — I871 Compression of vein: Secondary | ICD-10-CM

## 2020-04-15 DIAGNOSIS — G459 Transient cerebral ischemic attack, unspecified: Secondary | ICD-10-CM | POA: Diagnosis present

## 2020-04-15 DIAGNOSIS — E785 Hyperlipidemia, unspecified: Secondary | ICD-10-CM | POA: Diagnosis not present

## 2020-04-15 DIAGNOSIS — Z7982 Long term (current) use of aspirin: Secondary | ICD-10-CM | POA: Insufficient documentation

## 2020-04-15 DIAGNOSIS — R4701 Aphasia: Secondary | ICD-10-CM | POA: Diagnosis not present

## 2020-04-15 DIAGNOSIS — Z79899 Other long term (current) drug therapy: Secondary | ICD-10-CM | POA: Insufficient documentation

## 2020-04-15 DIAGNOSIS — I10 Essential (primary) hypertension: Secondary | ICD-10-CM | POA: Insufficient documentation

## 2020-04-15 DIAGNOSIS — Z87891 Personal history of nicotine dependence: Secondary | ICD-10-CM | POA: Insufficient documentation

## 2020-04-15 DIAGNOSIS — Z20822 Contact with and (suspected) exposure to covid-19: Secondary | ICD-10-CM | POA: Insufficient documentation

## 2020-04-15 DIAGNOSIS — C44722 Squamous cell carcinoma of skin of right lower limb, including hip: Secondary | ICD-10-CM | POA: Diagnosis not present

## 2020-04-15 DIAGNOSIS — K219 Gastro-esophageal reflux disease without esophagitis: Secondary | ICD-10-CM | POA: Insufficient documentation

## 2020-04-15 DIAGNOSIS — R4702 Dysphasia: Secondary | ICD-10-CM

## 2020-04-15 DIAGNOSIS — G629 Polyneuropathy, unspecified: Secondary | ICD-10-CM

## 2020-04-15 DIAGNOSIS — I6522 Occlusion and stenosis of left carotid artery: Secondary | ICD-10-CM

## 2020-04-15 HISTORY — DX: Transient cerebral ischemic attack, unspecified: G45.9

## 2020-04-15 LAB — URINALYSIS, COMPLETE (UACMP) WITH MICROSCOPIC
Bacteria, UA: NONE SEEN
Bilirubin Urine: NEGATIVE
Glucose, UA: NEGATIVE mg/dL
Hgb urine dipstick: NEGATIVE
Ketones, ur: NEGATIVE mg/dL
Leukocytes,Ua: NEGATIVE
Nitrite: NEGATIVE
Protein, ur: NEGATIVE mg/dL
Specific Gravity, Urine: 1.014 (ref 1.005–1.030)
Squamous Epithelial / HPF: NONE SEEN (ref 0–5)
pH: 5 (ref 5.0–8.0)

## 2020-04-15 LAB — COMPREHENSIVE METABOLIC PANEL
ALT: 17 U/L (ref 0–44)
AST: 24 U/L (ref 15–41)
Albumin: 4.1 g/dL (ref 3.5–5.0)
Alkaline Phosphatase: 65 U/L (ref 38–126)
Anion gap: 7 (ref 5–15)
BUN: 27 mg/dL — ABNORMAL HIGH (ref 8–23)
CO2: 26 mmol/L (ref 22–32)
Calcium: 9.5 mg/dL (ref 8.9–10.3)
Chloride: 107 mmol/L (ref 98–111)
Creatinine, Ser: 1.2 mg/dL (ref 0.61–1.24)
GFR, Estimated: 57 mL/min — ABNORMAL LOW (ref >60)
Glucose, Bld: 95 mg/dL (ref 70–99)
Potassium: 4.3 mmol/L (ref 3.5–5.1)
Sodium: 140 mmol/L (ref 135–145)
Total Bilirubin: 1.1 mg/dL (ref 0.3–1.2)
Total Protein: 7.5 g/dL (ref 6.5–8.1)

## 2020-04-15 LAB — CBC
HCT: 48.1 % (ref 39.0–52.0)
Hemoglobin: 15.2 g/dL (ref 13.0–17.0)
MCH: 28.3 pg (ref 26.0–34.0)
MCHC: 31.6 g/dL (ref 30.0–36.0)
MCV: 89.4 fL (ref 80.0–100.0)
Platelets: 163 10*3/uL (ref 150–400)
RBC: 5.38 MIL/uL (ref 4.22–5.81)
RDW: 15.4 % (ref 11.5–15.5)
WBC: 8 10*3/uL (ref 4.0–10.5)
nRBC: 0 % (ref 0.0–0.2)

## 2020-04-15 LAB — DIFFERENTIAL
Abs Immature Granulocytes: 0.02 10*3/uL (ref 0.00–0.07)
Basophils Absolute: 0.1 10*3/uL (ref 0.0–0.1)
Basophils Relative: 1 %
Eosinophils Absolute: 0.4 10*3/uL (ref 0.0–0.5)
Eosinophils Relative: 5 %
Immature Granulocytes: 0 %
Lymphocytes Relative: 24 %
Lymphs Abs: 1.9 10*3/uL (ref 0.7–4.0)
Monocytes Absolute: 1 10*3/uL (ref 0.1–1.0)
Monocytes Relative: 12 %
Neutro Abs: 4.6 10*3/uL (ref 1.7–7.7)
Neutrophils Relative %: 58 %

## 2020-04-15 LAB — APTT: aPTT: 29 seconds (ref 24–36)

## 2020-04-15 LAB — PROTIME-INR
INR: 1 (ref 0.8–1.2)
Prothrombin Time: 13.2 seconds (ref 11.4–15.2)

## 2020-04-15 LAB — SARS CORONAVIRUS 2 BY RT PCR (HOSPITAL ORDER, PERFORMED IN ~~LOC~~ HOSPITAL LAB): SARS Coronavirus 2: NEGATIVE

## 2020-04-15 MED ORDER — PREGABALIN 75 MG PO CAPS: 75.0000 mg | ORAL_CAPSULE | Freq: Three times a day (TID) | ORAL | Status: DC

## 2020-04-15 MED ORDER — QUETIAPINE FUMARATE 25 MG PO TABS
75.0000 mg | ORAL_TABLET | Freq: Every day | ORAL | Status: DC
  Administered 2020-04-15: 75 mg via ORAL
  Filled 2020-04-15: qty 3

## 2020-04-15 MED ORDER — LOSARTAN POTASSIUM 25 MG PO TABS
25.0000 mg | ORAL_TABLET | Freq: Every day | ORAL | Status: DC
  Administered 2020-04-16: 25 mg via ORAL
  Filled 2020-04-15: qty 1

## 2020-04-15 MED ORDER — AMLODIPINE BESYLATE 5 MG PO TABS
2.5000 mg | ORAL_TABLET | Freq: Every day | ORAL | Status: DC
  Administered 2020-04-15: 2.5 mg via ORAL
  Filled 2020-04-15: qty 1

## 2020-04-15 MED ORDER — IOHEXOL 350 MG/ML SOLN
75.0000 mL | Freq: Once | INTRAVENOUS | Status: AC | PRN
  Administered 2020-04-15: 75 mL via INTRAVENOUS

## 2020-04-15 MED ORDER — STROKE: EARLY STAGES OF RECOVERY BOOK: Freq: Once | Status: AC

## 2020-04-15 MED ORDER — ASPIRIN EC 81 MG PO TBEC
81.0000 mg | DELAYED_RELEASE_TABLET | Freq: Every day | ORAL | Status: DC
Start: 2020-04-15 — End: 2020-04-16
  Administered 2020-04-16: 81 mg via ORAL
  Filled 2020-04-15 (×2): qty 1

## 2020-04-15 MED ORDER — CLOPIDOGREL BISULFATE 75 MG PO TABS
75.0000 mg | ORAL_TABLET | Freq: Every day | ORAL | Status: DC
  Administered 2020-04-16: 75 mg via ORAL
  Filled 2020-04-15: qty 1

## 2020-04-15 MED ORDER — RISPERIDONE 1 MG PO TABS: 0.5000 mg | ORAL_TABLET | ORAL | Status: DC

## 2020-04-15 MED ORDER — TAMSULOSIN HCL 0.4 MG PO CAPS
0.4000 mg | ORAL_CAPSULE | Freq: Every day | ORAL | Status: DC
  Administered 2020-04-16: 0.4 mg via ORAL
  Filled 2020-04-15: qty 1

## 2020-04-15 MED ORDER — ADULT MULTIVITAMIN W/MINERALS CH
1.0000 | ORAL_TABLET | Freq: Every day | ORAL | Status: DC
  Administered 2020-04-16: 1 via ORAL
  Filled 2020-04-15: qty 1

## 2020-04-15 MED ORDER — ACETAMINOPHEN 160 MG/5ML PO SOLN
650.0000 mg | ORAL | Status: DC | PRN
  Filled 2020-04-15: qty 20.3

## 2020-04-15 MED ORDER — SENNOSIDES-DOCUSATE SODIUM 8.6-50 MG PO TABS: 1.0000 | ORAL_TABLET | Freq: Every evening | ORAL | Status: DC | PRN

## 2020-04-15 MED ORDER — SODIUM CHLORIDE 0.9 % IV SOLN: INTRAVENOUS | Status: DC

## 2020-04-15 MED ORDER — ACETAMINOPHEN 650 MG RE SUPP: 650.0000 mg | RECTAL | Status: DC | PRN

## 2020-04-15 MED ORDER — TRAZODONE HCL 50 MG PO TABS: 25.0000 mg | ORAL_TABLET | Freq: Every evening | ORAL | Status: DC | PRN

## 2020-04-15 MED ORDER — ALUM & MAG HYDROXIDE-SIMETH 200-200-20 MG/5ML PO SUSP: 30.0000 mL | ORAL | Status: DC | PRN

## 2020-04-15 MED ORDER — PREGABALIN 75 MG PO CAPS
75.0000 mg | ORAL_CAPSULE | Freq: Three times a day (TID) | ORAL | Status: DC
  Administered 2020-04-15 – 2020-04-16 (×2): 75 mg via ORAL
  Filled 2020-04-15 (×2): qty 1

## 2020-04-15 MED ORDER — DORZOLAMIDE HCL-TIMOLOL MAL 2-0.5 % OP SOLN
1.0000 [drp] | Freq: Two times a day (BID) | OPHTHALMIC | Status: DC
  Administered 2020-04-15: 1 [drp] via OPHTHALMIC
  Filled 2020-04-15: qty 10

## 2020-04-15 MED ORDER — PANTOPRAZOLE SODIUM 40 MG PO TBEC
40.0000 mg | DELAYED_RELEASE_TABLET | Freq: Every day | ORAL | Status: DC
  Administered 2020-04-16: 40 mg via ORAL
  Filled 2020-04-15: qty 1

## 2020-04-15 MED ORDER — ENOXAPARIN SODIUM 40 MG/0.4ML ~~LOC~~ SOLN
40.0000 mg | SUBCUTANEOUS | Status: DC
  Administered 2020-04-15: 40 mg via SUBCUTANEOUS
  Filled 2020-04-15: qty 0.4

## 2020-04-15 MED ORDER — ROSUVASTATIN CALCIUM 5 MG PO TABS
5.0000 mg | ORAL_TABLET | Freq: Every evening | ORAL | Status: DC
  Filled 2020-04-15: qty 1

## 2020-04-15 MED ORDER — LATANOPROST 0.005 % OP SOLN
1.0000 [drp] | Freq: Every day | OPHTHALMIC | Status: DC
  Administered 2020-04-15: 1 [drp] via OPHTHALMIC
  Filled 2020-04-15: qty 2.5

## 2020-04-15 MED ORDER — DORZOLAMIDE HCL 2 % OP SOLN: 1.0000 [drp] | Freq: Two times a day (BID) | OPHTHALMIC | Status: DC

## 2020-04-15 MED ORDER — GABAPENTIN 100 MG PO CAPS
100.0000 mg | ORAL_CAPSULE | Freq: Three times a day (TID) | ORAL | Status: DC
  Administered 2020-04-15 – 2020-04-16 (×2): 100 mg via ORAL
  Filled 2020-04-15 (×2): qty 1

## 2020-04-15 MED ORDER — MELATONIN 5 MG PO TABS
10.0000 mg | ORAL_TABLET | Freq: Every day | ORAL | Status: DC
  Administered 2020-04-15: 10 mg via ORAL
  Filled 2020-04-15: qty 2

## 2020-04-15 MED ORDER — ACETAMINOPHEN 325 MG PO TABS: 650.0000 mg | ORAL_TABLET | ORAL | Status: DC | PRN

## 2020-04-15 NOTE — ED Notes (Signed)
Family at bedside. 

## 2020-04-15 NOTE — ED Notes (Signed)
Pt in MRI.

## 2020-04-15 NOTE — ED Notes (Signed)
PT GIVEN MEAL TRAY.

## 2020-04-15 NOTE — ED Notes (Signed)
Daughter at bedside.

## 2020-04-15 NOTE — ED Provider Notes (Signed)
[facility] Emergency Department Provider Note ____________________________________________   Event Date/Time   First MD Initiated Contact with Patient 04/15/20 1345     (approximate)  I have reviewed the triage vital signs and the nursing notes.  HISTORY  Chief Complaint Transient Ischemic Attack   HPI Steve Bryant is a 87 y.o. malewho presents to the ED for evaluation of transient aphasia.  Chart review indicates known AAA, follows with vascular surgery and measuring 4.9 cm 6 months ago on CT.  Glaucoma and squamous cell carcinoma. 2018 medical admission for TIA.  MRI brain negative.  Patient presents to the ED with his granddaughter, who provides additional history.  Patient lives at home alone, ambulatory with a walker.  Patient presents to the ED after an episode of transient aphasia and facial droop that occurred yesterday afternoon while speaking on the phone with his brother.  He reports this lasted "a couple minutes" before self resolving and has not recurred since then.  Denies syncopal episodes, headache, falls or trauma, fevers or recent illnesses.  Granddaughter reports that he seems fine now and is acting normally.  Patient has no further acute complaints.  Past Medical History:  Diagnosis Date  . AAA (abdominal aortic aneurysm) ([place])   . Arthritis   . Glaucoma   . Hyperlipidemia   . Hypertension   . Squamous cell carcinoma of skin 06/15/2019   left distal medial pretibial. WD SCC. EDC  . Squamous cell carcinoma of skin 06/15/2019   Right calf. KA-type. EDC  . Stroke [facility])     Patient Active Problem List   Diagnosis Date Noted  . Hallucinations, visual 02/09/2019  . Hyperlipidemia 11/04/2016  . CVA (cerebral vascular accident) ([place]) 10/15/2016  . Essential hypertension, benign 09/18/2016  . Stroke ([place]) 09/18/2016  . AAA (abdominal aortic aneurysm) without rupture ([place]) 09/18/2016  . Gait instability 08/26/2016  . History  of COPD 11/22/2013  . SSS (sick sinus syndrome) ([place]) 11/22/2013  . DDD (degenerative disc disease), lumbar 10/02/2013  . Lumbar radiculitis 10/02/2013  . Arthritis 05/18/2013  . Asthma 05/18/2013  . Degenerative joint disease of cervical and lumbar spine 05/18/2013  . Idiopathic peripheral neuropathy 05/18/2013  . Renal insufficiency 05/18/2013    Past Surgical History:  Procedure Laterality Date  . EYE SURGERY    . neck fusion    . TUMOR REMOVAL      Prior to Admission medications   Medication Sig Start Date End Date Taking? Authorizing Provider  amLODipine (NORVASC) 2.5 MG tablet Take 2.5 mg by mouth daily. 10/06/16   [provider]  aspirin EC 81 MG tablet Take by mouth.    [provider]  clopidogrel (PLAVIX) 75 MG tablet Take 75 mg by mouth daily. 06/09/18   [provider]  dorzolamide (TRUSOPT) 2 % ophthalmic solution INSTILL ONE DROP TO BOTH EYES TWICE DAILY 10/18/14   [provider]  dorzolamide-timolol (COSOPT) 22.3-6.8 MG/ML ophthalmic solution INSTILL ONE DROP TWICE DAILY TO BOTH EYES 08/10/16   [provider]  gabapentin (NEURONTIN) 100 MG capsule Take one capsule ( 100 mg) in the morning and two capsules ( 200 mg) at night. 05/09/19   [provider]  latanoprost (XALATAN) 0.005 % ophthalmic solution USE 1 DROP IN THE RIGHT EYE AT BEDTIME AS DIRECTED 09/03/16   [provider]  losartan (COZAAR) 25 MG tablet Take 1 tablet (25 mg total) by mouth daily. 10/16/16   Gladstone Lighter, MD  LYRICA 75 MG capsule Take  75 mg by mouth 3 (three) times daily. 07/10/16   [provider]  Melatonin 10 MG TABS Take 1 tablet by mouth at bedtime.    [provider]  Multiple Vitamin (MULTIVITAMIN) tablet Take 1 tablet by mouth daily.    [provider]  omeprazole (PRILOSEC) 20 MG capsule Take 20 mg by mouth daily.    [provider]  pregabalin (LYRICA) 75 MG capsule TAKE 1 CAPSULE BY MOUTH 3  TIMES A DAY 02/24/19   [provider]  QUEtiapine (SEROQUEL) 25 MG tablet TAKE 3 TABLETS ( 75 MG ) AT DINNER. 07/02/19   [provider]  risperiDONE (RISPERDAL) 0.5 MG tablet TAKE ONE TABLET (0.5 MG) AT DINNER AND THEN TAKE ANOTHER TABLET (0.5 MG) AT BEDTIME. 06/13/19   [provider]  ROCKLATAN 0.02-0.005 % SOLN INSTILL 1 DROP IN THE RIGHT EYE ONCE IN THE EVENING 11/05/17   [provider]  rosuvastatin (CRESTOR) 5 MG tablet Take 5 mg by mouth daily.    [provider]  tamsulosin (FLOMAX) 0.4 MG CAPS capsule Take by mouth. 07/20/19 07/19/20  [provider]    Allergies Shellfish-derived products  Family History  Problem Relation Age of Onset  . Stroke Mother   . Stroke Father   . Cancer Brother     Social History Social History   Tobacco Use  . Smoking status: Former Research scientist (life sciences)  . Smokeless tobacco: Never Used  Vaping Use  . Vaping Use: Never used  Substance Use Topics  . Alcohol use: Not Currently    Comment: occasionally  . Drug use: No    Review of Systems  Constitutional: No fever/chills Eyes: No visual changes. ENT: No sore throat. Cardiovascular: Denies chest pain. Respiratory: Denies shortness of breath. Gastrointestinal: No abdominal pain.  No nausea, no vomiting.  No diarrhea.  No constipation. Genitourinary: Negative for dysuria. Musculoskeletal: Negative for back pain. Skin: Negative for rash. Neurological: Negative for headaches.  Positive for transient aphasia  ____________________________________________   PHYSICAL EXAM:  VITAL SIGNS: Vitals:   04/15/20 1048  BP: 139/67  Pulse: (!) 46  Resp: 16  Temp: 98.2 F (36.8 C)  SpO2: 99%     Constitutional: Alert and oriented. Well appearing and in no acute distress. Eyes: Conjunctivae are normal. PERRL. EOMI. Head: Atraumatic. Nose: No congestion/rhinnorhea. Mouth/Throat: Mucous membranes are moist.  Oropharynx non-erythematous. Neck: No stridor.  No cervical spine tenderness to palpation. Cardiovascular: Normal rate, regular rhythm. Grossly normal heart sounds.  Good peripheral circulation. Respiratory: Normal respiratory effort.  No retractions. Lungs CTAB. Gastrointestinal: Soft , nondistended, nontender to palpation. No CVA tenderness. Musculoskeletal: No lower extremity tenderness nor edema.  No joint effusions. No signs of acute trauma. Neurologic:  Normal speech and language. No gross focal neurologic deficits are appreciated.  Chronic decreased vision from his glaucoma, otherwise cranial nerves II through XII intact 5/5 strength and sensation in all 4 extremities Skin:  Skin is warm, dry and intact. No rash noted. Psychiatric: Mood and affect are normal. Speech and behavior are normal.  ____________________________________________   LABS (all labs ordered are listed, but only abnormal results are displayed)  Labs Reviewed  COMPREHENSIVE METABOLIC PANEL - Abnormal; Notable for the following components:      Result Value   BUN 27 (*)    GFR, Estimated 57 (*)    All other components within normal limits  CBC  DIFFERENTIAL  PROTIME-INR  APTT  URINALYSIS, COMPLETE (UACMP) WITH MICROSCOPIC   ____________________________________________  12 Lead  EKG  Sinus rhythm, rate of 46 bpm.  Normal axis.  First-degree AV block with PR interval of 224 ms, otherwise appropriate intervals.  No evidence of acute ischemia.  Sinus bradycardia. ____________________________________________  RADIOLOGY  ED MD interpretation: CT head reviewed by me without evidence of acute intracranial pathology.  Official radiology report(s): CT HEAD WO CONTRAST  Result Date: 04/15/2020 CLINICAL DATA:  Slurred speech. EXAM: CT HEAD WITHOUT CONTRAST TECHNIQUE: Contiguous axial images were obtained from the base of the skull through the vertex without intravenous contrast. COMPARISON:  January 28, 2019. FINDINGS: Brain: Mild diffuse cortical atrophy is  noted. Mild chronic ischemic white matter disease is noted. Old bilateral cerebellar infarctions are noted. No mass effect or midline shift is noted. Ventricular size is within normal limits. There is no evidence of mass lesion, hemorrhage or acute infarction. Vascular: No hyperdense vessel or unexpected calcification. Skull: Normal. Negative for fracture or focal lesion. Sinuses/Orbits: Left frontal, ethmoid and sphenoid sinusitis is noted. Other: None. IMPRESSION: Mild diffuse cortical atrophy. Mild chronic ischemic white matter disease. Old bilateral cerebellar infarctions. No acute intracranial abnormality seen. Electronically Signed   By: Marijo Conception M.D.   On: 04/15/2020 11:23    ____________________________________________   PROCEDURES and INTERVENTIONS  Procedure(s) performed (including Critical Care):  .1-3 Lead EKG Interpretation Performed by: Vladimir Crofts, MD Authorized by: Vladimir Crofts, MD     Interpretation: normal     ECG rate:  50   ECG rate assessment: bradycardic     Rhythm: sinus bradycardia     Ectopy: none     Conduction: normal      Medications - No data to display  ____________________________________________   MDM / ED COURSE   87 year old male with history of previous stroke and TIA, presented to the ED after an episode of transient aphasia, concerning for a TIA, and requiring medical observation admission.  Sinus bradycardia, otherwise normal vitals on room air.  Exam reassuring without evidence of acute neurovascular deficits, signs of trauma or distress.  He looks well and is asymptomatic here in the ED.  Blood work unremarkable.  EKG nonischemic and a sinus bradycardia with chronic first-degree AV block.  Dry CT head shows no ICH or signs of acute CVA.  Due to his risk factors for stroke and apparent TIA, with his increased risk for acute stroke in the next couple days, as well as living home alone, will discuss the case with hospitalist medicine for  observation medical admission.     ____________________________________________   FINAL CLINICAL IMPRESSION(S) / ED DIAGNOSES  Final diagnoses:  TIA (transient ischemic attack)  Aphasia     ED Discharge Orders    None       Miriah Maruyama   Note:  This document was prepared using Dragon voice recognition software and may include unintentional dictation errors.   Vladimir Crofts, MD 04/15/20 [phone]

## 2020-04-15 NOTE — ED Notes (Signed)
Pt brought back from MRI and taken to CT

## 2020-04-15 NOTE — H&P (Signed)
[place]   PATIENT NAME: Steve Bryant    MR#:  [id number]  DATE OF BIRTH:  1925/12/31  DATE OF ADMISSION:  04/15/2020  PRIMARY CARE PHYSICIAN: Steve Pitch, MD   Patient is coming from: Home REQUESTING/REFERRING PHYSICIAN: Vladimir Crofts, MD  CHIEF COMPLAINT:   Chief Complaint  Patient presents with  . Transient Ischemic Attack    HISTORY OF PRESENT ILLNESS:  Steve Bryant is a 87 y.o. Caucasian male with medical history significant for Hypertension, dyslipidemia and CVA transient slurred speech with expressive dysphasia yesterday that lasted about 5 minutes. He denied any dysphagia, paresthesias or focal muscle weakness. No witnessed seizures. No urinary or stool incontinence. No tinnitus or vertigo. No nausea or vomiting or abdominal pain. No dysuria, oliguria or hematuria or flank pain. Patient is on aspirin and Plavix. ED Course: Initially in the ER heart rate was 46 then 54 with otherwise normal vital signs. Labs revealed. Labs revealed no acute abnormalities. COVID-19 PCR is currently pending. UA was unremarkable. EKG as reviewed by me : shows sinus bradycardia with first-degree AV block with a rate of 46 Imaging: Noncontrasted head CT scan revealed mild diffuse cortical atrophy with mild chronic ischemic white matter disease, old bilateral cerebellar infarctions with no acute intracranial normalities.  The patient will be admitted to an observation medical monitored bed for further evaluation and management. PAST MEDICAL HISTORY:   Past Medical History:  Diagnosis Date  . AAA (abdominal aortic aneurysm) ([place])   . Arthritis   . Glaucoma   . Hyperlipidemia   . Hypertension   . Squamous cell carcinoma of skin 06/15/2019   left distal medial pretibial. WD SCC. EDC  . Squamous cell carcinoma of skin 06/15/2019   Right calf. KA-type. EDC  . Stroke ([place])   Blindness.  PAST SURGICAL HISTORY:   Past Surgical History:  Procedure Laterality Date  . EYE  SURGERY    . neck fusion    . TUMOR REMOVAL      SOCIAL HISTORY:   Social History   Tobacco Use  . Smoking status: Former Research scientist (life sciences)  . Smokeless tobacco: Never Used  Substance Use Topics  . Alcohol use: Not Currently    Comment: occasionally    FAMILY HISTORY:   Family History  Problem Relation Age of Onset  . Stroke Mother   . Stroke Father   . Cancer Brother     DRUG ALLERGIES:   Allergies  Allergen Reactions  . Shellfish-Derived Products Nausea Only    Scallops    REVIEW OF SYSTEMS:   ROS As per history of present illness. All pertinent systems were reviewed above. Constitutional, HEENT, cardiovascular, respiratory, GI, GU, musculoskeletal, neuro, psychiatric, endocrine, integumentary and hematologic systems were reviewed and are otherwise negative/unremarkable except for positive findings mentioned above in the HPI.   MEDICATIONS AT HOME:   Prior to Admission medications   Medication Sig Start Date End Date Taking? Authorizing Provider  aspirin EC 81 MG tablet Take 81 mg by mouth daily.   Yes [provider]  clopidogrel (PLAVIX) 75 MG tablet Take 75 mg by mouth daily. 06/09/18  Yes [provider]  dorzolamide (TRUSOPT) 2 % ophthalmic solution INSTILL ONE DROP TO BOTH EYES TWICE DAILY 10/18/14  Yes [provider]  dorzolamide-timolol (COSOPT) 22.3-6.8 MG/ML ophthalmic solution INSTILL ONE DROP TWICE DAILY TO BOTH EYES 08/10/16  Yes [provider]  gabapentin (NEURONTIN) 100 MG capsule Take 100 mg by mouth 3 (three) times daily. 05/09/19  Yes [provider]  latanoprost (XALATAN) 0.005 % ophthalmic solution USE 1 DROP IN THE RIGHT EYE AT BEDTIME AS DIRECTED 09/03/16  Yes [provider]  losartan (COZAAR) 25 MG tablet Take 1 tablet (25 mg total) by mouth daily. 10/16/16  Yes Steve Lighter, MD  Multiple Vitamin (MULTIVITAMIN) tablet Take 1 tablet by mouth daily.   Yes [provider]  MYRBETRIQ 25 MG  TB24 tablet Take 25 mg by mouth daily. 03/13/20  Yes [provider]  pantoprazole (PROTONIX) 20 MG tablet Take 20 mg by mouth daily. 01/23/20  Yes [provider]  pregabalin (LYRICA) 75 MG capsule Take 75 mg by mouth 3 (three) times daily. 02/24/19  Yes [provider]  QUEtiapine (SEROQUEL) 25 MG tablet TAKE 3 TABLETS ( 75 MG ) AT DINNER. 07/02/19  Yes [provider]  rosuvastatin (CRESTOR) 5 MG tablet Take 5 mg by mouth daily.   Yes [provider]  amLODipine (NORVASC) 5 MG tablet Take 5 mg by mouth daily. 03/11/20   [provider]  Melatonin 10 MG TABS Take 1 tablet by mouth at bedtime. Patient not taking: No sig reported    [provider]      VITAL SIGNS:  Blood pressure (!) 191/106, pulse (!) 46, temperature 98.2 F (36.8 C), temperature source Oral, resp. rate 18, height 5\' 9"  (1.753 m), weight 72.6 kg, SpO2 100 %.  PHYSICAL EXAMINATION:  Physical Exam  GENERAL:  87 y.o.-year-old Caucasian male patient lying in the bed with no acute distress.  EYES: The patient is blind. No scleral icterus. Extraocular muscles intact.  HEENT: Head atraumatic, normocephalic. Oropharynx and nasopharynx clear.  NECK:  Supple, no jugular venous distention. No thyroid enlargement, no tenderness.  LUNGS: Normal breath sounds bilaterally, no wheezing, rales,rhonchi or crepitation. No use of accessory muscles of respiration.  CARDIOVASCULAR: Regular rate and rhythm, S1, S2 normal. No murmurs, rubs, or gallops.  ABDOMEN: Soft, nondistended, nontender. Bowel sounds present. No organomegaly or mass.  EXTREMITIES: No pedal edema, cyanosis, or clubbing.  NEUROLOGIC: Cranial nerves II through XII are intact. Muscle strength 5/5 in all extremities. Sensation intact. Gait not checked.  PSYCHIATRIC: The patient is alert and oriented x 3.  Normal affect and good eye contact. SKIN: No obvious rash, lesion, or ulcer.   LABORATORY PANEL:   CBC Recent  Labs  Lab 04/15/20 1118  WBC 8.0  HGB 15.2  HCT 48.1  PLT 163   ------------------------------------------------------------------------------------------------------------------  Chemistries  Recent Labs  Lab 04/15/20 1118  NA 140  K 4.3  CL 107  CO2 26  GLUCOSE 95  BUN 27*  CREATININE 1.20  CALCIUM 9.5  AST 24  ALT 17  ALKPHOS 65  BILITOT 1.1   ------------------------------------------------------------------------------------------------------------------  Cardiac Enzymes No results for input(s): TROPONINI in the last 168 hours. ------------------------------------------------------------------------------------------------------------------  RADIOLOGY:  CT HEAD WO CONTRAST  Result Date: 04/15/2020 CLINICAL DATA:  Slurred speech. EXAM: CT HEAD WITHOUT CONTRAST TECHNIQUE: Contiguous axial images were obtained from the base of the skull through the vertex without intravenous contrast. COMPARISON:  January 28, 2019. FINDINGS: Brain: Mild diffuse cortical atrophy is noted. Mild chronic ischemic white matter disease is noted. Old bilateral cerebellar infarctions are noted. No mass effect or midline shift is noted. Ventricular size is within normal limits. There is no evidence of mass lesion, hemorrhage or acute infarction. Vascular: No hyperdense vessel or unexpected calcification. Skull: Normal. Negative for fracture or focal lesion. Sinuses/Orbits: Left frontal, ethmoid and sphenoid sinusitis is noted.  Other: None. IMPRESSION: Mild diffuse cortical atrophy. Mild chronic ischemic white matter disease. Old bilateral cerebellar infarctions. No acute intracranial abnormality seen. Electronically Signed   By: Marijo Conception M.D.   On: 04/15/2020 11:23      IMPRESSION AND PLAN:  Active Problems:   TIA (transient ischemic attack)  1. Transient ischemic attack with history of CVAs. The patient will be admitted to an observation medically monitored bed.  We will follow neuro  checks q.4 hours for 24 hours.  The patient will be placed on aspirin and Plavix.  Will obtain a brain MRI without contrast as well as head and neck CTA and 2D echo with bubble study .  A neurology consultation  as well as physical/occupation/speech therapy consults will be obtained in a.m..  The patient will be placed on statin therapy and fasting lipids will be checked. I notified Dr. Curly Shores about the patient.  2. Essential hypertension. -We'll continue Cozaar and amlodipine with permissive parameters.  3. Dyslipidemia. -We'll continue statin therapy and check fasting lipids.  4. Peripheral neuropathy. -We'll continue Neurontin.  5. GERD. -We'll continue PPI therapy.   DVT prophylaxis: Lovenox subcutaneously Code Status: full code discussed with the patient and granddaughter. Family Communication:  The plan of care was discussed in details with the patient and granddaughter. I answered all questions. The patient agreed to proceed with the above mentioned plan. Further management will depend upon hospital course. Disposition Plan: Back to previous home environment Consults called: Neurology consult to Dr. Curly Shores. All the records are reviewed and case discussed with ED provider.  CODE STATUS: Full code  Status is: Observation  The patient remains OBS appropriate and will d/c before 2 midnights.  Dispo: The patient is from: Home              Anticipated d/c is to: Home              Anticipated d/c date is: 1 day              Patient currently is not medically stable to d/c.   Difficult to place patient No   TOTAL TIME TAKING CARE OF THIS PATIENT: 55 minutes.    Christel Mormon M.D on 04/15/2020 at 4:18 PM  [facility]   From 7 PM-7 AM, contact night-coverage [email or web address]  CC: Primary care physician; Steve Pitch, MD

## 2020-04-15 NOTE — ED Triage Notes (Addendum)
Pt states that yesterday he was talking to someone on the phone and suddenly couldn't get his words out and felt the left side of his face draw up, states it lasted for a few minutes and resolved. Pt is a/ox4 on arrival, pt si visual impaired, walks with a walker and family is with the pt for safety

## 2020-04-15 NOTE — ED Notes (Signed)
Pt back at bedside , daughter with pt

## 2020-04-16 ENCOUNTER — Observation Stay (HOSPITAL_BASED_OUTPATIENT_CLINIC_OR_DEPARTMENT_OTHER)
Admit: 2020-04-16 | Discharge: 2020-04-16 | Disposition: A | Discharge status: Home / Self Care | Payer: Medicare Other | Attending: Family Medicine | Admitting: Family Medicine

## 2020-04-16 DIAGNOSIS — G459 Transient cerebral ischemic attack, unspecified: Secondary | ICD-10-CM

## 2020-04-16 DIAGNOSIS — I871 Compression of vein: Secondary | ICD-10-CM | POA: Diagnosis not present

## 2020-04-16 DIAGNOSIS — I5189 Other ill-defined heart diseases: Secondary | ICD-10-CM

## 2020-04-16 DIAGNOSIS — G6289 Other specified polyneuropathies: Secondary | ICD-10-CM

## 2020-04-16 DIAGNOSIS — R4701 Aphasia: Secondary | ICD-10-CM | POA: Diagnosis not present

## 2020-04-16 DIAGNOSIS — I6522 Occlusion and stenosis of left carotid artery: Secondary | ICD-10-CM

## 2020-04-16 DIAGNOSIS — E785 Hyperlipidemia, unspecified: Secondary | ICD-10-CM | POA: Diagnosis not present

## 2020-04-16 HISTORY — DX: Other ill-defined heart diseases: I51.89

## 2020-04-16 LAB — ECHOCARDIOGRAM COMPLETE
AR max vel: 2.27 cm2
AV Area VTI: 2.58 cm2
AV Area mean vel: 2.26 cm2
AV Mean grad: 3 mmHg
AV Peak grad: 5.8 mmHg
Ao pk vel: 1.2 m/s
Area-P 1/2: 8.25 cm2
Height: 69 in
MV VTI: 2.6 cm2
S' Lateral: 2.5 cm
Weight: 2560 oz

## 2020-04-16 LAB — LIPID PANEL
Cholesterol: 101 mg/dL (ref 0–200)
HDL: 35 mg/dL — ABNORMAL LOW (ref >40)
LDL Cholesterol: 41 mg/dL (ref 0–99)
Total CHOL/HDL Ratio: 2.9 RATIO
Triglycerides: 125 mg/dL (ref <150)
VLDL: 25 mg/dL (ref 0–40)

## 2020-04-16 LAB — HEMOGLOBIN A1C
Hgb A1c MFr Bld: 6 % — ABNORMAL HIGH (ref 4.8–5.6)
Mean Plasma Glucose: 125.5 mg/dL

## 2020-04-16 NOTE — Evaluation (Signed)
Occupational Therapy Evaluation Patient Details Name: Steve Bryant MRN: [id number] DOB: 14-Jun-1925 Today's Date: 04/16/2020    History of Present Illness 87 y.o. Caucasian male with medical history significant for Hypertension, dyslipidemia and CVA transient slurred speech with expressive dysphasia yesterday that lasted about 5 minutes. He denied any dysphagia, paresthesias or focal muscle weakness. No witnessed seizures. No urinary or stool incontinence. No tinnitus or vertigo. No nausea or vomiting or abdominal pain. No dysuria, oliguria or hematuria or flank pain. Patient is on aspirin and Plavix   Clinical Impression   Pt seen for OT evaluation. Pt reports living at home alone with use of rollator for functional mobility. Pt reports family assists with IADL tasks, appointments, and cooking of meals. Pt is legally blind but sees shapes and shadows. He walks to mailbox each day with lights on driveway to know his location. Pt very active and independent at home. Pt needing supervision and cuing this session only because of different environment. He is at baseline. No remaining deficits at this time. OT to SIGN OFF. Thank you for referral.     Follow Up Recommendations  Supervision - Intermittent    Equipment Recommendations  None recommended by OT       Precautions / Restrictions Precautions Precautions: Fall      Mobility Bed Mobility Overal bed mobility: Modified Independent             General bed mobility comments: increased time    Transfers Overall transfer level: Needs assistance Equipment used: 4-wheeled walker Transfers: Sit to/from [employer] Transfers Sit to Stand: Supervision Stand pivot transfers: Supervision       General transfer comment: supervision for safety with cuing for directions secondary to vision loss    Balance Overall balance assessment: Needs assistance Sitting-balance support: Feet supported Sitting balance-Leahy Scale:  Good     Standing balance support: During functional activity Standing balance-Leahy Scale: Good                             ADL either performed or assessed with clinical judgement   ADL Overall ADL's : At baseline;Modified independent                                       General ADL Comments: Pt did need assistance to don B shoes while seated on EOB but most likely due to set up from hospital bed . Pt reports performing task from chair at home which is feels more comfortable with for safety.     Vision Baseline Vision/History: Legally blind Patient Visual Report: No change from baseline              Pertinent Vitals/Pain Pain Assessment: Faces Faces Pain Scale: No hurt     Hand Dominance Left   Extremity/Trunk Assessment Upper Extremity Assessment Upper Extremity Assessment: Overall WFL for tasks assessed   Lower Extremity Assessment Lower Extremity Assessment: Overall WFL for tasks assessed       Communication Communication Communication: No difficulties   Cognition Arousal/Alertness: Awake/alert Behavior During Therapy: WFL for tasks assessed/performed Overall Cognitive Status: Within Functional Limits for tasks assessed  Home Living Family/patient expects to be discharged to:: Private residence Living Arrangements: Alone Available Help at Discharge: Available PRN/intermittently;Family Type of Home: House Home Access: Stairs to enter [employer] of Steps: 2 Entrance Stairs-Rails: Right Home Layout: Two level;Able to live on main level with bedroom/bathroom     Bathroom Shower/Tub: Occupational psychologist: Standard     Home Equipment: Environmental consultant - 4 wheels;Cane - single point;Shower seat          Prior Functioning/Environment Level of Independence: Independent with assistive device(s)        Comments: use of rollator other other  modifications within the home to complete mobility and self care tasks at mod I level with visual impairments. Pt has family assist with IADL tasks and cooking.                 OT Goals(Current goals can be found in the care plan section) Acute Rehab OT Goals Patient Stated Goal: to return home OT Goal Formulation: With patient/family Time For Goal Achievement: 04/30/20 Potential to Achieve Goals: Good  OT Frequency:      AM-PAC OT "6 Clicks" Daily Activity     Outcome Measure Help from another person eating meals?: None Help from another person taking care of personal grooming?: None Help from another person toileting, which includes using toliet, bedpan, or urinal?: None Help from another person bathing (including washing, rinsing, drying)?: None Help from another person to put on and taking off regular upper body clothing?: None Help from another person to put on and taking off regular lower body clothing?: None 6 Click Score: 24   End of Session Equipment Utilized During Treatment: Other (comment) (rollator) Nurse Communication: Mobility status  Activity Tolerance: Patient tolerated treatment well Patient left: in bed;with call bell/phone within reach;with family/visitor present                   Time: 4628-6381 OT Time Calculation (min): 25 min Charges:  OT General Charges $OT Visit: 1 Visit OT Evaluation $OT Eval Low Complexity: 1 Low OT Treatments $Self Care/Home Management : 8-22 mins  Darleen Crocker, MS, OTR/L , CBIS ascom [phone]  04/16/20, 10:35 AM

## 2020-04-16 NOTE — Discharge Instructions (Signed)
Transient Ischemic Attack A transient ischemic attack (TIA) causes stroke-like symptoms that go away quickly. Having a TIA means that a person is at higher risk for a stroke. A TIA happens when blood supply to the brain is blocked temporarily. A TIA is a medical emergency. What are the causes? This condition is caused by a temporary blockage in an artery in the head or neck. This means the brain does not get the blood supply it needs. There is no permanent brain damage with a TIA. A blockage can be caused by:  Fatty buildup in an artery in the head or neck (atherosclerosis).  A blood clot.  An artery tear (dissection).  Inflammation of an artery (vasculitis). Sometimes the cause is not known. What increases the risk? Certain factors may make you more likely to develop this condition. Some of these are things that you can change, such as:  Obesity.  Using products that contain nicotine or tobacco.  Taking oral birth control, especially if you also use tobacco.  Not being active.  Heavy alcohol use.  Drug use, especially cocaine and methamphetamine. Medical conditions that may increase your risk include:  High blood pressure (hypertension).  High cholesterol.  Diabetes.  Heart disease (coronary artery disease).  An irregular heartbeat, also called atrial fibrillation (AFib).  Sickle cell disease.  Sleep problems (sleep apnea).  Chronic inflammatory diseases, such as rheumatoid arthritis or lupus.  Blood clotting disorders (hypercoagulable state). Other risk factors include:  Being over the age of 60.  Being male.  Family history of stroke.  Previous history of blood clots, stroke, TIA, or heart attack.  Having a history of preeclampsia.  Migraine headache. What are the signs or symptoms? Symptoms of a TIA are the same as those of a stroke. The symptoms develop suddenly, and then go away quickly. They may include:  Weakness or numbness in your face, arm, or  leg, especially on one side of your body.  Trouble walking or moving your arms or legs.  Trouble speaking, understanding speech, or both (aphasia).  Vision changes, such as double vision, blurred vision, or loss of vision.  Dizziness.  Confusion.  Loss of balance or coordination.  Nausea and vomiting.  Severe headache. If possible, note what time your symptoms started. Tell your health care provider.   How is this diagnosed? This condition may be diagnosed based on:  Your symptoms and medical history.  A physical exam.  Imaging tests, usually a CT scan or MRI of the brain.  Blood tests. You may also have other tests, including:  Electrocardiogram (ECG).  Echocardiogram.  Carotid ultrasound.  A scan of blood circulation in the brain (CT angiogram or MR angiogram).  Continuous heart monitoring. How is this treated? The goal of treatment is to reduce the risk for a stroke. Stroke prevention therapies may include:  Changes to diet and lifestyle, such as being physically active and stopping smoking.  Medicines to thin the blood (antiplatelets or anticoagulants).  Blood pressure medicines.  Medicines to reduce cholesterol.  Treating other health conditions, such as diabetes or AFib. If testing shows a narrowing in the arteries to your brain, your health care provider may recommend a procedure, such as:  Carotid endarterectomy. This is done to remove the blockage from your artery.  Carotid angioplasty and stenting. This uses a tube (stent) to open or widen an artery in the neck. The stent helps keep the artery open by supporting the artery walls. Follow these instructions at home: Medicines    Take over-the-counter and prescription medicines only as told by your health care provider.  If you were told to take a medicine to thin your blood, such as aspirin or an anticoagulant, take it exactly as told by your health care provider. ? Taking too much blood-thinning  medicine can cause bleeding. Taking too little will not protect you against a stroke and other problems. Eating and drinking  Eat 5 or more servings of fruits and vegetables each day.  Follow guidelines from your health care provider about your diet. You may need to follow a certain diet to help manage risk factors for stroke. This may include: ? Eating a low-fat, low-salt diet. ? Choosing high-fiber foods. ? Limiting carbohydrates and sugar.  If you drink alcohol: ? Limit how much you have to:  0-1 drink a day for women who are not pregnant.  0-2 drinks a day for men. ? Know how much alcohol is in a drink. In the [place] one drink equals one 12 oz bottle of beer (355mL), one 5 oz glass of wine (148mL), or one 1 oz glass of hard liquor (44mL).   General instructions  Maintain a healthy weight.  Try to get at least 30 minutes of exercise on most days.  Get treatment if you have sleep apnea.  Do not use any products that contain nicotine or tobacco. These products include cigarettes, chewing tobacco, and vaping devices, such as e-cigarettes. If you need help quitting, ask your health care provider.  Do not use drugs.  Keep all follow-up visits. This is important. Where to find more information  American Stroke Association: www.stroke.org Get help right away if:  You have chest pain or an irregular heartbeat.  You have any symptoms of a stroke. "BE FAST" is an easy way to remember the main warning signs of a stroke. ? B - Balance. Signs are dizziness, sudden trouble walking, or loss of balance. ? E - Eyes. Signs are trouble seeing or a sudden change in vision. ? F - Face. Signs are sudden weakness or numbness of the face, or the face or eyelid drooping on one side. ? A - Arms. Signs are weakness or numbness in an arm. This happens suddenly and usually on one side of the body. ? S - Speech. Signs are sudden trouble speaking, slurred speech, or trouble understanding what people  say. ? T - Time. Time to call emergency services. Write down what time symptoms started.  You have other signs of a stroke, such as: ? A sudden, severe headache with no known cause. ? Nausea or vomiting. ? Seizure. These symptoms may represent a serious problem that is an emergency. Do not wait to see if the symptoms will go away. Get medical help right away. Call your local emergency services (911 in the [place] Do not drive yourself to the hospital. Summary  A transient ischemic attack (TIA) happens when an artery in the head or neck is blocked. The blockage clears before there is any permanent brain damage. A TIA is a medical emergency.  Symptoms of a TIA are the same as those of a stroke. The symptoms develop suddenly, and then go away quickly.  Having a TIA means that you are at higher risk for a stroke.  The goal of treatment is to reduce your risk for a stroke. Treatment may include medicines to thin the blood and changes to diet and lifestyle. This information is not intended to replace advice given to you by your health   care provider. Make sure you discuss any questions you have with your health care provider. Document Revised: 09/19/2019 Document Reviewed: 09/19/2019 Elsevier Patient Education  2021 [employer]  

## 2020-04-16 NOTE — Discharge Summary (Signed)
[facility] NAME: Steve Bryant    MR#:  [id number]  DATE OF BIRTH:  03/15/1925  DATE OF ADMISSION:  04/15/2020 ADMITTING PHYSICIAN: Christel Mormon, MD  DATE OF DISCHARGE: 04/17/2019  PRIMARY CARE PHYSICIAN: [name], MD    ADMISSION DIAGNOSIS:  Aphasia [R47.01] TIA (transient ischemic attack) [G45.9]  DISCHARGE DIAGNOSIS:  Active Problems:   TIA (transient ischemic attack)   SECONDARY DIAGNOSIS:   Past Medical History:  Diagnosis Date  . AAA (abdominal aortic aneurysm) ([place])   . Arthritis   . Glaucoma   . Hyperlipidemia   . Hypertension   . Squamous cell carcinoma of skin 06/15/2019   left distal medial pretibial. WD SCC. EDC  . Squamous cell carcinoma of skin 06/15/2019   Right calf. KA-type. EDC  . Stroke [facility])     HOSPITAL COURSE:   1.  Transient ischemic attack.  Patient had some difficulty speaking and some numbness on his left side of his face and some twitching on his face.  Patient already on aspirin and Plavix and Crestor.  Seen by neurology.  And did well with occupational therapy and will be discharged home.  Echocardiogram did not show any clot. 2.  Left carotid stenosis and right subclavian stenosis.  Follow-up with Dr. Lucky Cowboy vascular surgery as outpatient. 3.  Essential hypertension.  Blood pressure high on presentation and low today held Norvasc and can can continue Cozaar.  Follow-up blood pressure as outpatient. 4.  Hyperlipidemia unspecified.  LDL 41.  Continue Crestor. 5.  Peripheral neuropathy on Neurontin and Lyrica.  DISCHARGE CONDITIONS:   Satisfactory  CONSULTS OBTAINED:  Neurology  DRUG ALLERGIES:   Allergies  Allergen Reactions  . Shellfish-Derived Products Nausea Only    Scallops    DISCHARGE MEDICATIONS:   Allergies as of 04/16/2020      Reactions   Shellfish-derived Products Nausea Only   Scallops      Medication List    STOP taking these medications   amLODipine 5  MG tablet Commonly known as: NORVASC   dorzolamide 2 % ophthalmic solution Commonly known as: TRUSOPT     TAKE these medications   aspirin EC 81 MG tablet Take 81 mg by mouth daily.   clopidogrel 75 MG tablet Commonly known as: PLAVIX Take 75 mg by mouth daily.   dorzolamide-timolol 22.3-6.8 MG/ML ophthalmic solution Commonly known as: COSOPT INSTILL ONE DROP TWICE DAILY TO BOTH EYES   gabapentin 100 MG capsule Commonly known as: NEURONTIN Take 100 mg by mouth 3 (three) times daily.   latanoprost 0.005 % ophthalmic solution Commonly known as: XALATAN USE 1 DROP IN THE RIGHT EYE AT BEDTIME AS DIRECTED   losartan 25 MG tablet Commonly known as: COZAAR Take 1 tablet (25 mg total) by mouth daily.   Melatonin 10 MG Tabs Take 1 tablet by mouth at bedtime.   multivitamin tablet Take 1 tablet by mouth daily.   Myrbetriq 25 MG Tb24 tablet Generic drug: mirabegron ER Take 25 mg by mouth daily.   pantoprazole 20 MG tablet Commonly known as: PROTONIX Take 20 mg by mouth daily.   pregabalin 75 MG capsule Commonly known as: LYRICA Take 75 mg by mouth 3 (three) times daily.   QUEtiapine 25 MG tablet Commonly known as: SEROQUEL TAKE 3 TABLETS ( 75 MG ) AT DINNER.   rosuvastatin 5 MG tablet Commonly known as: CRESTOR Take 5 mg by mouth daily.        DISCHARGE  INSTRUCTIONS:   Follow-up PMD 5 days Follow-up vascular surgery 2 weeks  If you experience worsening of your admission symptoms, develop shortness of breath, life threatening emergency, suicidal or homicidal thoughts you must seek medical attention immediately by calling 911 or calling your MD immediately  if symptoms less severe.  You Must read complete instructions/literature along with all the possible adverse reactions/side effects for all the Medicines you take and that have been prescribed to you. Take any new Medicines after you have completely understood and accept all the possible adverse  reactions/side effects.   Please note  You were cared for by a hospitalist during your hospital stay. If you have any questions about your discharge medications or the care you received while you were in the hospital after you are discharged, you can call the unit and asked to speak with the hospitalist on call if the hospitalist that took care of you is not available. Once you are discharged, your primary care physician will handle any further medical issues. Please note that NO REFILLS for any discharge medications will be authorized once you are discharged, as it is imperative that you return to your primary care physician (or establish a relationship with a primary care physician if you do not have one) for your aftercare needs so that they can reassess your need for medications and monitor your lab values.    Today   CHIEF COMPLAINT:   Chief Complaint  Patient presents with  . Transient Ischemic Attack    HISTORY OF PRESENT ILLNESS:  Steve Bryant  is a 87 y.o. male came in with difficulty speaking and numbness of the lips of the left side of face   VITAL SIGNS:  Blood pressure (!) 100/56, pulse (!) 53, temperature 98.2 F (36.8 C), resp. rate 17, height 5\' 9"  (1.753 m), weight 72.6 kg, SpO2 97 %.  I/O:  No intake or output data in the 24 hours ending 04/16/20 1713  PHYSICAL EXAMINATION:  GENERAL:  87 y.o.-year-old patient lying in the bed with no acute distress.  EYES: Pupils equal, round, reactive to light and accommodation. No scleral icterus. HEENT: Head atraumatic, normocephalic. Oropharynx and nasopharynx clear.  LUNGS: Normal breath sounds bilaterally, no wheezing, rales,rhonchi or crepitation. No use of accessory muscles of respiration.  CARDIOVASCULAR: S1, S2 normal. No murmurs, rubs, or gallops.  ABDOMEN: Soft, non-tender, non-distended.  EXTREMITIES: No pedal edema.  NEUROLOGIC: Cranial nerves II through XII are intact. Muscle strength 5/5 in all extremities.  Sensation intact. Gait not checked.  PSYCHIATRIC: The patient is alert and oriented x 3.  SKIN: No obvious rash, lesion, or ulcer.   DATA REVIEW:   CBC Recent Labs  Lab 04/15/20 1118  WBC 8.0  HGB 15.2  HCT 48.1  PLT 163    Chemistries  Recent Labs  Lab 04/15/20 1118  NA 140  K 4.3  CL 107  CO2 26  GLUCOSE 95  BUN 27*  CREATININE 1.20  CALCIUM 9.5  AST 24  ALT 17  ALKPHOS 65  BILITOT 1.1     Microbiology Results  Results for orders placed or performed during the hospital encounter of 04/15/20  SARS Coronavirus 2 by RT PCR (hospital order, performed in [facility] hospital lab) Nasopharyngeal Nasopharyngeal Swab     Status: None   Collection Time: 04/15/20  4:20 PM   Specimen: Nasopharyngeal Swab  Result Value Ref Range Status   SARS Coronavirus 2 NEGATIVE NEGATIVE Final    Comment: (NOTE) SARS-CoV-2 target nucleic  acids are NOT DETECTED.  The SARS-CoV-2 RNA is generally detectable in upper and lower respiratory specimens during the acute phase of infection. The lowest concentration of SARS-CoV-2 viral copies this assay can detect is 250 copies / mL. A negative result does not preclude SARS-CoV-2 infection and should not be used as the sole basis for treatment or other patient management decisions.  A negative result may occur with improper specimen collection / handling, submission of specimen other than nasopharyngeal swab, presence of viral mutation(s) within the areas targeted by this assay, and inadequate number of viral copies (<250 copies / mL). A negative result must be combined with clinical observations, patient history, and epidemiological information.  Fact Sheet for Patients:   StrictlyIdeas.no  Fact Sheet for Healthcare Providers: BankingDealers.co.za  This test is not yet approved or  cleared by the [place] FDA and has been authorized for detection and/or diagnosis of SARS-CoV-2 by FDA  under an Emergency Use Authorization (EUA).  This EUA will remain in effect (meaning this test can be used) for the duration of the COVID-19 declaration under Section 564(b)(1) of the Act, 21 U.S.C. section 360bbb-3(b)(1), unless the authorization is terminated or revoked sooner.  Performed at [facility], [address], [place], [place] [address]     RADIOLOGY:  CT Code Stroke CTA Head W/WO contrast  Result Date: 04/15/2020 CLINICAL DATA:  Initial evaluation for acute TIA.  6 EXAM: CT ANGIOGRAPHY HEAD AND NECK TECHNIQUE: Multidetector CT imaging of the head and neck was performed using the standard protocol during bolus administration of intravenous contrast. Multiplanar CT image reconstructions and MIPs were obtained to evaluate the vascular anatomy. Carotid stenosis measurements (when applicable) are obtained utilizing NASCET criteria, using the distal internal carotid diameter as the denominator. CONTRAST:  31mL OMNIPAQUE IOHEXOL 350 MG/ML SOLN COMPARISON:  Prior CT and MRI from earlier the same day. FINDINGS: CTA NECK FINDINGS Aortic arch: Visualized aortic arch of normal caliber with normal branch pattern. Severe atheromatous change about the arch and origin of the great vessels. Estimated stenosis of up to 40% at the origin of the left subclavian artery. Additional severe 75% stenosis at the origin of the right subclavian artery prior to the takeoff of the right vertebral artery (series 4, image 23). Right carotid system: Right CCA patent from its origin to the bifurcation without stenosis. Suspected previous endarterectomy at the right bifurcation. No residual or recurrent stenosis. Right ICA patent distally without stenosis, dissection or occlusion. Left carotid system: Scattered plaque within the left CCA without significant stenosis. Bulky calcified plaque about the left bifurcation/proximal left ICA with associated stenosis of up to 70% by NASCET criteria. Left ICA patent  distally without stenosis, dissection or occlusion. Vertebral arteries: Both vertebral arteries arise from the subclavian arteries. Atheromatous plaque at the origins of both vertebral arteries with associated moderate to severe ostial stenoses, left worse than right. Vertebral arteries otherwise patent within the neck without stenosis, dissection or occlusion. Skeleton: Prior fusion at C3-C7. No acute osseous abnormality. No discrete or worrisome osseous lesions. Other neck: Moderate to advanced chronic paranasal sinus disease noted. Focal dehiscence of the underlying inner table at the left frontal sinus noted (series 4, image 141). 5.1 x 3.7 cm soft tissue mass at the base of the right neck/upper right mediastinum, indeterminate, but similar to previous. Upper chest: Scattered emphysematous changes with scarring noted within the visualized lungs. Postsurgical changes present at the right lung apex. Scattered calcified pleural plaques noted about the left lung apex.  Review of the MIP images confirms the above findings CTA HEAD FINDINGS Anterior circulation: Petrous segments patent bilaterally. Advanced atheromatous change throughout the carotid siphons with associated moderate diffuse narrowing. A1 segments patent. Normal anterior communicating artery complex. Anterior cerebral arteries patent to their distal aspects without stenosis. Normal in stenosis or occlusion. Normal MCA bifurcations. Distal MCA branches well perfused and symmetric. Posterior circulation: Atheromatous plaque noted within the dominant left V4 segment with associated moderate multifocal narrowing. Right V4 segment patent without significant stenosis. Left PICA origin patent and normal. Right PICA not visualized. Dominant right AICA. Basilar mildly irregular but is patent to its distal aspect without stenosis. Superior cerebellar arteries patent bilaterally. Both PCAs primarily supplied via the basilar. Scattered atheromatous irregularity  within the PCAs without high-grade stenosis. Venous sinuses: Grossly patent allowing for timing the contrast bolus. Anatomic variants: None significant.  No aneurysm. Review of the MIP images confirms the above findings IMPRESSION: 1. Negative CTA for large vessel occlusion. 2. Bulky calcified plaque about the left carotid bifurcation/proximal left ICA with associated stenosis of up to 70% by NASCET criteria. 3. Probable previous right carotid endarterectomy. 4. Atheromatous plaque at the origins of both vertebral arteries with associated moderate to severe ostial stenoses, left worse than right. 5. Advanced atheromatous change throughout the carotid siphons with associated moderate diffuse narrowing. 6. Advanced atheromatous change about the aortic arch and proximal great vessels with associated proximal subclavian artery stenoses measuring up to 40% on the left and 75% on the right. 7. 5.1 x 3.7 cm soft tissue mass at the base of the right neck/upper right mediastinum, indeterminate, but similar as compared to multiple previous exams. 8. Emphysema (ICD10-J43.9). Electronically Signed   By: Jeannine Boga M.D.   On: 04/15/2020 18:32   CT HEAD WO CONTRAST  Result Date: 04/15/2020 CLINICAL DATA:  Slurred speech. EXAM: CT HEAD WITHOUT CONTRAST TECHNIQUE: Contiguous axial images were obtained from the base of the skull through the vertex without intravenous contrast. COMPARISON:  January 28, 2019. FINDINGS: Brain: Mild diffuse cortical atrophy is noted. Mild chronic ischemic white matter disease is noted. Old bilateral cerebellar infarctions are noted. No mass effect or midline shift is noted. Ventricular size is within normal limits. There is no evidence of mass lesion, hemorrhage or acute infarction. Vascular: No hyperdense vessel or unexpected calcification. Skull: Normal. Negative for fracture or focal lesion. Sinuses/Orbits: Left frontal, ethmoid and sphenoid sinusitis is noted. Other: None. IMPRESSION:  Mild diffuse cortical atrophy. Mild chronic ischemic white matter disease. Old bilateral cerebellar infarctions. No acute intracranial abnormality seen. Electronically Signed   By: Marijo Conception M.D.   On: 04/15/2020 11:23   CT Code Stroke CTA Neck W/WO contrast  Result Date: 04/15/2020 CLINICAL DATA:  Initial evaluation for acute TIA.  6 EXAM: CT ANGIOGRAPHY HEAD AND NECK TECHNIQUE: Multidetector CT imaging of the head and neck was performed using the standard protocol during bolus administration of intravenous contrast. Multiplanar CT image reconstructions and MIPs were obtained to evaluate the vascular anatomy. Carotid stenosis measurements (when applicable) are obtained utilizing NASCET criteria, using the distal internal carotid diameter as the denominator. CONTRAST:  78mL OMNIPAQUE IOHEXOL 350 MG/ML SOLN COMPARISON:  Prior CT and MRI from earlier the same day. FINDINGS: CTA NECK FINDINGS Aortic arch: Visualized aortic arch of normal caliber with normal branch pattern. Severe atheromatous change about the arch and origin of the great vessels. Estimated stenosis of up to 40% at the origin of the left subclavian artery. Additional severe 75% stenosis  at the origin of the right subclavian artery prior to the takeoff of the right vertebral artery (series 4, image 23). Right carotid system: Right CCA patent from its origin to the bifurcation without stenosis. Suspected previous endarterectomy at the right bifurcation. No residual or recurrent stenosis. Right ICA patent distally without stenosis, dissection or occlusion. Left carotid system: Scattered plaque within the left CCA without significant stenosis. Bulky calcified plaque about the left bifurcation/proximal left ICA with associated stenosis of up to 70% by NASCET criteria. Left ICA patent distally without stenosis, dissection or occlusion. Vertebral arteries: Both vertebral arteries arise from the subclavian arteries. Atheromatous plaque at the origins  of both vertebral arteries with associated moderate to severe ostial stenoses, left worse than right. Vertebral arteries otherwise patent within the neck without stenosis, dissection or occlusion. Skeleton: Prior fusion at C3-C7. No acute osseous abnormality. No discrete or worrisome osseous lesions. Other neck: Moderate to advanced chronic paranasal sinus disease noted. Focal dehiscence of the underlying inner table at the left frontal sinus noted (series 4, image 141). 5.1 x 3.7 cm soft tissue mass at the base of the right neck/upper right mediastinum, indeterminate, but similar to previous. Upper chest: Scattered emphysematous changes with scarring noted within the visualized lungs. Postsurgical changes present at the right lung apex. Scattered calcified pleural plaques noted about the left lung apex. Review of the MIP images confirms the above findings CTA HEAD FINDINGS Anterior circulation: Petrous segments patent bilaterally. Advanced atheromatous change throughout the carotid siphons with associated moderate diffuse narrowing. A1 segments patent. Normal anterior communicating artery complex. Anterior cerebral arteries patent to their distal aspects without stenosis. Normal in stenosis or occlusion. Normal MCA bifurcations. Distal MCA branches well perfused and symmetric. Posterior circulation: Atheromatous plaque noted within the dominant left V4 segment with associated moderate multifocal narrowing. Right V4 segment patent without significant stenosis. Left PICA origin patent and normal. Right PICA not visualized. Dominant right AICA. Basilar mildly irregular but is patent to its distal aspect without stenosis. Superior cerebellar arteries patent bilaterally. Both PCAs primarily supplied via the basilar. Scattered atheromatous irregularity within the PCAs without high-grade stenosis. Venous sinuses: Grossly patent allowing for timing the contrast bolus. Anatomic variants: None significant.  No aneurysm.  Review of the MIP images confirms the above findings IMPRESSION: 1. Negative CTA for large vessel occlusion. 2. Bulky calcified plaque about the left carotid bifurcation/proximal left ICA with associated stenosis of up to 70% by NASCET criteria. 3. Probable previous right carotid endarterectomy. 4. Atheromatous plaque at the origins of both vertebral arteries with associated moderate to severe ostial stenoses, left worse than right. 5. Advanced atheromatous change throughout the carotid siphons with associated moderate diffuse narrowing. 6. Advanced atheromatous change about the aortic arch and proximal great vessels with associated proximal subclavian artery stenoses measuring up to 40% on the left and 75% on the right. 7. 5.1 x 3.7 cm soft tissue mass at the base of the right neck/upper right mediastinum, indeterminate, but similar as compared to multiple previous exams. 8. Emphysema (ICD10-J43.9). Electronically Signed   By: Jeannine Boga M.D.   On: 04/15/2020 18:32   MR BRAIN WO CONTRAST  Result Date: 04/15/2020 CLINICAL DATA:  Initial evaluation for acute speech difficulty, left-sided facial weakness. EXAM: MRI HEAD WITHOUT CONTRAST TECHNIQUE: Multiplanar, multiecho pulse sequences of the brain and surrounding structures were obtained without intravenous contrast. COMPARISON:  Prior head CT from earlier the same day as well as previous MRI from 10/16/2016. FINDINGS: Brain: Diffuse prominence of the CSF  containing spaces compatible with moderately advanced cerebral atrophy. Patchy T2/FLAIR hyperintensity within the periventricular and deep white matter both cerebral hemispheres most consistent with chronic small vessel ischemic disease, also moderate in nature. Patchy involvement the pons noted. Few scattered superimposed remote lacunar infarcts present about the bilateral basal ganglia, thalami, and pons. Small remote cortical infarct noted at the right parietal lobe. Additional scattered remote  bilateral cerebellar infarcts noted. No abnormal foci of restricted diffusion to suggest acute or subacute ischemia. Gray-white matter differentiation maintained. No other areas of remote cortical infarction. No evidence for acute intracranial hemorrhage. Few scattered chronic micro hemorrhages noted about the cerebellum and brainstem, likely hypertensive in nature. No mass lesion, midline shift or mass effect. No hydrocephalus or extra-axial fluid collection. Pituitary gland and suprasellar region within normal limits. Midline structures intact and normal. Vascular: Major intracranial vascular flow voids are maintained. Skull and upper cervical spine: Craniocervical junction within normal limits. Postsurgical changes partially visualize within the upper cervical spine. Bone marrow signal intensity normal. No scalp soft tissue abnormality. Sinuses/Orbits: Patient status post bilateral ocular lens replacement. Globes and orbital soft tissues demonstrate no acute finding. Moderate mucosal thickening seen throughout the paranasal sinuses, chronic in appearance. No mastoid effusion. Inner ear structures grossly normal. Other: None. IMPRESSION: 1. No acute intracranial abnormality. 2. Moderately advanced cerebral atrophy with chronic microvascular ischemic disease, with multiple remote ischemic infarcts as detailed above. Electronically Signed   By: Jeannine Boga M.D.   On: 04/15/2020 17:48   ECHOCARDIOGRAM COMPLETE  Result Date: 04/16/2020    ECHOCARDIOGRAM REPORT   Patient Name:   Steve Bryant Date of Exam: 04/16/2020 Medical Rec #:  [id number]         Height:       69.0 in Accession #:    [id number]        Weight:       160.0 lb Date of Birth:  Jan 20, 1926        BSA:          1.879 m Patient Age:    55 years          BP:           93/56 mmHg Patient Gender: M                 HR:           73 bpm. Exam Location:  [facility] Procedure: 2D Echo, Color Doppler and Cardiac Doppler Indications:     G45.9 TIA   History:         Patient has prior history of Echocardiogram examinations.                  Stroke; Risk Factors:Hypertension and Dyslipidemia.  Sonographer:     Charmayne Sheer RDCS (AE) Referring Phys:  [id number] Arvella Merles MANSY Diagnosing Phys: Harrell Gave End MD  Sonographer Comments: Suboptimal apical window and no subcostal window. IMPRESSIONS  1. Left ventricular ejection fraction, by estimation, is 60 to 65%. The left ventricle has normal function. Left ventricular endocardial border not optimally defined to evaluate regional wall motion. Left ventricular diastolic parameters are consistent with Grade I diastolic dysfunction (impaired relaxation). Elevated left atrial pressure.  2. Right ventricular systolic function is normal. The right ventricular size is normal. Tricuspid regurgitation signal is inadequate for assessing PA pressure.  3. The mitral valve is grossly normal. Trivial mitral valve regurgitation. No evidence of mitral stenosis.  4. The aortic valve has an indeterminant number of cusps.  There is moderate calcification of the aortic valve. There is moderate thickening of the aortic valve. Aortic valve regurgitation is mild. Mild to moderate aortic valve sclerosis/calcification is  present, without any evidence of aortic stenosis.  5. Aortic dilatation noted. There is mild dilatation of the aortic root. FINDINGS  Left Ventricle: Left ventricular ejection fraction, by estimation, is 60 to 65%. The left ventricle has normal function. Left ventricular endocardial border not optimally defined to evaluate regional wall motion. The left ventricular internal cavity size was normal in size. There is no left ventricular hypertrophy. Left ventricular diastolic parameters are consistent with Grade I diastolic dysfunction (impaired relaxation). Elevated left atrial pressure. Right Ventricle: The right ventricular size is normal. No increase in right ventricular wall thickness. Right ventricular systolic function is  normal. Tricuspid regurgitation signal is inadequate for assessing PA pressure. Left Atrium: Left atrial size was normal in size. Right Atrium: Right atrial size was normal in size. Pericardium: There is no evidence of pericardial effusion. Presence of pericardial fat pad. Mitral Valve: The mitral valve is grossly normal. Trivial mitral valve regurgitation. No evidence of mitral valve stenosis. MV peak gradient, 2.5 mmHg. The mean mitral valve gradient is 2.0 mmHg. Tricuspid Valve: The tricuspid valve is not well visualized. Tricuspid valve regurgitation is not demonstrated. Aortic Valve: The aortic valve has an indeterminant number of cusps. There is moderate calcification of the aortic valve. There is moderate thickening of the aortic valve. Aortic valve regurgitation is mild. Mild to moderate aortic valve sclerosis/calcification is present, without any evidence of aortic stenosis. Aortic valve mean gradient measures 3.0 mmHg. Aortic valve peak gradient measures 5.8 mmHg. Aortic valve area, by VTI measures 2.58 cm. Pulmonic Valve: The pulmonic valve was not well visualized. Pulmonic valve regurgitation is not visualized. No evidence of pulmonic stenosis. Aorta: Aortic dilatation noted. There is mild dilatation of the aortic root. Pulmonary Artery: The pulmonary artery is not well seen. IAS/Shunts: The interatrial septum was not well visualized.  LEFT VENTRICLE PLAX 2D LVIDd:         3.40 cm  Diastology LVIDs:         2.50 cm  LV e' lateral:   3.92 cm/s LV PW:         0.90 cm  LV E/e' lateral: 15.2 LV IVS:        0.80 cm LVOT diam:     2.20 cm LV SV:         55 LV SV Index:   29 LVOT Area:     3.80 cm  LEFT ATRIUM           Index      RIGHT ATRIUM           Index LA diam:      1.90 cm 1.01 cm/m RA Area:     11.68 cm 6.21 cm/m LA Vol (A4C): 17.2 ml 9.15 ml/m  AORTIC VALVE                   PULMONIC VALVE AV Area (Vmax):    2.27 cm    PV Vmax:       0.60 m/s AV Area (Vmean):   2.26 cm    PV Vmean:       45.600 cm/s AV Area (VTI):     2.58 cm    PV VTI:        0.114 m AV Vmax:           120.00 cm/s PV Peak  grad:  1.4 mmHg AV Vmean:          83.100 cm/s PV Mean grad:  1.0 mmHg AV VTI:            0.214 m AV Peak Grad:      5.8 mmHg AV Mean Grad:      3.0 mmHg LVOT Vmax:         71.80 cm/s LVOT Vmean:        49.500 cm/s LVOT VTI:          0.145 m LVOT/AV VTI ratio: 0.68  AORTA Ao Root diam: 4.10 cm MITRAL VALVE MV Area (PHT): 8.25 cm    SHUNTS MV Area VTI:   2.60 cm    Systemic VTI:  0.14 m MV Peak grad:  2.5 mmHg    Systemic Diam: 2.20 cm MV Mean grad:  2.0 mmHg MV Vmax:       0.80 m/s MV Vmean:      61.4 cm/s MV Decel Time: 92 msec MV E velocity: 59.60 cm/s MV A velocity: 74.10 cm/s MV E/A ratio:  0.80 Harrell Gave End MD Electronically signed by Nelva Bush MD Signature Date/Time: 04/16/2020/3:19:08 PM    Final      Management plans discussed with the patient, family and they are in agreement.  CODE STATUS:     Code Status Orders  (From admission, onward)         Start     Ordered   04/15/20 1513  Full code  Continuous        04/15/20 1515        Code Status History    Date Active Date Inactive Code Status Order ID Comments User Context   10/16/2016 1907 10/19/2016 1945 Full Code [id number]  Fritzi Mandes, MD Inpatient   10/15/2016 1423 10/16/2016 1339 Full Code [id number]  Fritzi Mandes, MD Inpatient   Advance Care Planning Activity      TOTAL TIME TAKING CARE OF THIS PATIENT: 35 minutes.    Loletha Grayer M.D on 04/16/2020 at 5:13 PM  Between 7am to 6pm - Pager - [phone]  After 6pm go to [email or web address] - password [email or web address]  [facility]  CC: Primary care physician; [name], MD

## 2020-04-16 NOTE — Progress Notes (Signed)
*  PRELIMINARY RESULTS* Echocardiogram 2D Echocardiogram has been performed.  Steve Bryant 04/16/2020, 9:41 AM

## 2020-04-16 NOTE — Progress Notes (Addendum)
SLP Cancellation Note  Patient Details Name: Steve Bryant MRN: [id number] DOB: [birth year]   Cancelled treatment:       Reason Eval/Treat Not Completed: SLP screened, no needs identified, will sign off (chart reviewed; consulted pt, family, and NSG). Pt denied any difficulty swallowing and is currently on a regular diet; tolerates swallowing pills w/ water per NSG. He had just finished eating breakfast w/ NSG assisting/supporting d/t Vision deficits. Pt conversed in conversation w/out deficits noted; he stated he felt his speech was at his baseline currently. Pt and family denied speech-language deficits. Noted MRI results: "No acute intracranial abnormality. 2. Moderately advanced cerebral atrophy with chronic microvascular ischemic disease, with multiple remote ischemic infarcts". No further skilled ST services indicated as pt appears at his baseline. Pt agreed. NSG to reconsult if any change in status.      Orinda Kenner, MS, CCC-SLP Speech Language Pathologist Rehab Services [phone] [facility] 04/16/2020, 9:55 AM

## 2020-04-16 NOTE — Progress Notes (Signed)
PT Cancellation Note  Patient Details Name: NECO KLING MRN: [id number] DOB: 16-Apr-1925   Cancelled Treatment:    Reason Eval/Treat Not Completed: Other (comment). Consult received and chart reviewed. Pt and daughter present in room. Pt reports he was able to ambulate with home rollator to chair without difficulty. Reports no change in strength/sensation/motor performance. States he has had therapy in the past and is familiar with ways to remain active at home. Reports he has good family support, access into home, and all equipment necessary. Per patient, no other needs made present at this time and politely declines evaluation and preparing to discharge. Encouraged to reach out if needs change. Will cancel current order.   Jamicah Anstead 04/16/2020, 1:28 PM  Greggory Stallion, PT, DPT [phone]

## 2020-04-16 NOTE — Plan of Care (Signed)

## 2020-04-16 NOTE — Consult Note (Signed)
Neurology Consultation Reason for Consult: Transient facial droop and slurred speech Requesting Physician: Cordelia Poche  CC: "Slurred speech and left facial droop"  History is obtained from: Patient and chart review (some records available in care everywhere)  HPI: Steve Bryant is a 87 y.o. male with a past medical history significant for hypertension, hyperlipidemia, aortic abdominal aneurysm, left tibial squamous cell carcinoma and right calf squamous cell carcinoma, right arm skin cancer (in his 21s), lung cancer (7353, complicated by right hand weakness secondary to surgery) prior stroke (mild residual left facial droop), blindness secondary to glaucoma, cervical spine surgery, former smoker, renal insufficiency or history of smoking (remote).  He reports that he was talking to his brother on Sunday afternoon and had 10 to 15 seconds of left facial droop and slurring his speech.  Specifically the words he used were normal but they were just less clearly articulated.  He does not feel that his brother noticed the change in his speech but the patient subjectively did.  On Monday he contacted his primary care physician who referred him to the ED for further evaluation.  He is followed by Dr. Melrose Nakayama on an outpatient basis for hallucinations for which he is on Seroquel (25 mg at 5:30 PM and 25 mg at bedtime), as well as gabapentin 100 mg in the morning and 200 mg at bedtime (feels like someone is playing with his toes which is felt to be secondary to neuropathy).  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  He lives independently in his home, ambulates with a walker and requires help with his grocery shopping and meals secondary to his blindness.   LKW:  tPA given?: No, due to symptoms resolved  IA performed?: No, no LVO Premorbid modified rankin scale:     2 - Slight disability. Able to look after own  affairs without assistance, but unable to carry out all previous activities.   ROS: A 14 point ROS was performed and is negative except as noted in the HPI.  Past Medical History:  Diagnosis Date  . AAA (abdominal aortic aneurysm) ([place])   . Arthritis   . Glaucoma   . Hyperlipidemia   . Hypertension   . Squamous cell carcinoma of skin 06/15/2019   left distal medial pretibial. WD SCC. EDC  . Squamous cell carcinoma of skin 06/15/2019   Right calf. KA-type. EDC  . Stroke [facility])    Past Surgical History:  Procedure Laterality Date  . EYE SURGERY    . neck fusion    . TUMOR REMOVAL     Current Outpatient Medications  Medication Instructions  . amLODipine (NORVASC) 5 mg, Oral, Daily  . aspirin EC 81 mg, Oral, Daily  . clopidogrel (PLAVIX) 75 mg, Oral, Daily  . dorzolamide (TRUSOPT) 2 % ophthalmic solution INSTILL ONE DROP TO BOTH EYES TWICE DAILY  . dorzolamide-timolol (COSOPT) 22.3-6.8 MG/ML ophthalmic solution INSTILL ONE DROP TWICE DAILY TO BOTH EYES  . gabapentin (NEURONTIN) 100 mg, Oral, 3 times daily  . latanoprost (XALATAN) 0.005 % ophthalmic solution USE 1 DROP IN THE RIGHT EYE AT BEDTIME AS DIRECTED  . losartan (COZAAR) 25 mg, Oral, Daily  . Melatonin 10 MG TABS 1 tablet, Daily at bedtime  . Multiple Vitamin (MULTIVITAMIN) tablet 1 tablet, Oral, Daily  . Myrbetriq 25 mg, Oral, Daily  . pantoprazole (PROTONIX) 20 mg, Oral, Daily  . pregabalin (LYRICA) 75 mg, Oral, 3 times daily  .  QUEtiapine (SEROQUEL) 25 MG tablet TAKE 3 TABLETS ( 75 MG ) AT DINNER.  [name] rosuvastatin (CRESTOR) 5 mg, Oral, Daily     Family History  Problem Relation Age of Onset  . Stroke Mother   . Stroke Father   . Cancer Brother      Social History:  reports that he has quit smoking. He has never used smokeless tobacco. He reports previous alcohol use. He reports that he does not use drugs.   Exam: Current vital signs: BP 109/66 (BP Location: Right Arm)   Pulse 74   Temp 98.6 F (37 C)    Resp 17   Ht 5\' 9"  (1.753 m)   Wt 72.6 kg   SpO2 96%   BMI 23.63 kg/m  Vital signs in last 24 hours: Temp:  [97.6 F (36.4 C)-98.6 F (37 C)] 98.6 F (37 C) (02/08 0836) Pulse Rate:  [46-82] 74 (02/08 0836) Resp:  [16-18] 17 (02/08 0836) BP: (93-191)/(56-106) 109/66 (02/08 0836) SpO2:  [96 %-100 %] 96 % (02/08 0836) Weight:  [72.6 kg] 72.6 kg (02/07 1049)   Physical Exam  Constitutional: Appears well-developed and well-nourished.  Psych: Affect appropriate to situation, pleasant and cooperative Eyes: No scleral injection HENT: No OP obstruction MSK: Some contracture of the right hand Cardiovascular: Normal rate and regular rhythm.  Respiratory: Effort normal, non-labored breathing GI: Soft.  No distension. There is no tenderness.  Skin: WDI visible skin  Neuro: Mental Status: Patient is awake, alert, oriented to person, place, month, year, and situation. Patient is able to give a clear and coherent history. No signs of aphasia or neglect Cranial Nerves: II: Right pupil 2 mm to 1 mm, left pupil 4 mm to 3 mm.  He does have perception of light and possibly vague shapes III,IV, VI: EOMI with mild right eye ptosis.  V: Facial sensation is symmetric to light touch VII: Facial movement is notable for mild left facial droop.  VIII: hearing is intact to voice (fairly hard of hearing at baseline) X: Uvula elevates symmetrically XI: Shoulder shrug is symmetric. XII: tongue is midline without atrophy or fasciculations.  Motor: Tone is normal. Bulk is normal. 5/5 strength was present in all four extremities except for some atrophy of the right forearm and hand which he reports is chronic secondary to prior surgery.  In particular right triceps and right wrist flexion are 4/5, finger extension is 3/5 with some contracture Sensory: Sensation is symmetric to light touch and temperature in the arms and legs. Deep Tendon Reflexes: 2+ and symmetric in the biceps and patellae.   Cerebellar: FNF and HKS are intact bilaterally   I have reviewed labs in epic and the results pertinent to this consultation are:  Lab Results  Component Value Date   CHOL 101 04/16/2020   HDL 35 (L) 04/16/2020   LDLCALC 41 04/16/2020   TRIG 125 04/16/2020   CHOLHDL 2.9 04/16/2020    Lab Results  Component Value Date   HGBA1C 6.0 (H) 04/16/2020     I have personally reviewed the images obtained:  MRI brain with multiple remote infarcts as detailed in radiology report including bilateral cerebellar and right parietal cortical infarct, and scattered remote lacunar infarcts as well as chronic microvascular disease.  No acute intracranial abnormality CTA reveals aortic arch atherosclerosis, left carotid artery 70% stenosis, probable right prior carotid endarterectomy in the right neck/mediastinal mass similar to prior exams.  Echo 04/16/2020   1. Left ventricular ejection fraction, by estimation, is 60  to 65%. The  left ventricle has normal function. Left ventricular endocardial border  not optimally defined to evaluate regional wall motion. Left ventricular  diastolic parameters are consistent  with Grade I diastolic dysfunction (impaired relaxation). Elevated left  atrial pressure.  2. Right ventricular systolic function is normal. The right ventricular  size is normal. Tricuspid regurgitation signal is inadequate for assessing  PA pressure.  3. The mitral valve is grossly normal. Trivial mitral valve  regurgitation. No evidence of mitral stenosis.  4. The aortic valve has an indeterminant number of cusps. There is  moderate calcification of the aortic valve. There is moderate thickening  of the aortic valve. Aortic valve regurgitation is mild. Mild to moderate  aortic valve sclerosis/calcification is  present, without any evidence of aortic stenosis.  5. Aortic dilatation noted. There is mild dilatation of the aortic root.   Impression: Differential diagnosis for this  very brief event includes TIA versus small focal seizure from his prior stroke.  Given this has not happened previously, and he has significant vascular risk factors, as well as waiting the patient's desire to be discharged as quickly as possible, we will not pursue EEG at this time.  Recommendations:  # TIA referable to the left MCA territory - Stroke labs HgbA1c, fasting lipid panel - MRI brain completed as above - CTA completed as above with 70% stenosis of the left internal carotid artery - Vascular surgery outpatient follow-up for left internal carotid artery stenosis - Frequent neuro checks - Echocardiogram completed, read pending - Continue home dual antiplatelet therapy with aspirin and Plavix - Risk factor modification (diet and exercise counseling) - Telemetry monitoring; 30 day event monitor on discharge if no arrythmias captured  - Blood pressure goal   - Normotension -Outpatient neurology follow-up  Lesleigh Noe MD-PhD [facility] [phone]

## 2020-05-27 ENCOUNTER — Emergency Department: Payer: Medicare Other

## 2020-05-27 ENCOUNTER — Inpatient Hospital Stay
Admission: EM | Admit: 2020-05-27 | Discharge: 2020-05-31 | DRG: 871 | Disposition: A | Discharge status: Home / Self Care | Payer: Medicare Other | Attending: Internal Medicine | Admitting: Internal Medicine

## 2020-05-27 ENCOUNTER — Other Ambulatory Visit: Payer: Self-pay

## 2020-05-27 DIAGNOSIS — Z66 Do not resuscitate: Secondary | ICD-10-CM | POA: Diagnosis present

## 2020-05-27 DIAGNOSIS — A419 Sepsis, unspecified organism: Principal | ICD-10-CM | POA: Diagnosis present

## 2020-05-27 DIAGNOSIS — Z79899 Other long term (current) drug therapy: Secondary | ICD-10-CM

## 2020-05-27 DIAGNOSIS — I714 Abdominal aortic aneurysm, without rupture: Secondary | ICD-10-CM | POA: Diagnosis present

## 2020-05-27 DIAGNOSIS — I639 Cerebral infarction, unspecified: Secondary | ICD-10-CM | POA: Diagnosis present

## 2020-05-27 DIAGNOSIS — I495 Sick sinus syndrome: Secondary | ICD-10-CM | POA: Diagnosis present

## 2020-05-27 DIAGNOSIS — Z823 Family history of stroke: Secondary | ICD-10-CM

## 2020-05-27 DIAGNOSIS — W19XXXA Unspecified fall, initial encounter: Secondary | ICD-10-CM

## 2020-05-27 DIAGNOSIS — J45909 Unspecified asthma, uncomplicated: Secondary | ICD-10-CM | POA: Diagnosis present

## 2020-05-27 DIAGNOSIS — Z981 Arthrodesis status: Secondary | ICD-10-CM

## 2020-05-27 DIAGNOSIS — I10 Essential (primary) hypertension: Secondary | ICD-10-CM | POA: Diagnosis present

## 2020-05-27 DIAGNOSIS — N183 Chronic kidney disease, stage 3 unspecified: Secondary | ICD-10-CM | POA: Diagnosis present

## 2020-05-27 DIAGNOSIS — J189 Pneumonia, unspecified organism: Secondary | ICD-10-CM | POA: Diagnosis present

## 2020-05-27 DIAGNOSIS — K219 Gastro-esophageal reflux disease without esophagitis: Secondary | ICD-10-CM | POA: Diagnosis present

## 2020-05-27 DIAGNOSIS — Z91013 Allergy to seafood: Secondary | ICD-10-CM

## 2020-05-27 DIAGNOSIS — G629 Polyneuropathy, unspecified: Secondary | ICD-10-CM | POA: Diagnosis present

## 2020-05-27 DIAGNOSIS — Z809 Family history of malignant neoplasm, unspecified: Secondary | ICD-10-CM

## 2020-05-27 DIAGNOSIS — H409 Unspecified glaucoma: Secondary | ICD-10-CM | POA: Diagnosis present

## 2020-05-27 DIAGNOSIS — Z85118 Personal history of other malignant neoplasm of bronchus and lung: Secondary | ICD-10-CM

## 2020-05-27 DIAGNOSIS — I129 Hypertensive chronic kidney disease with stage 1 through stage 4 chronic kidney disease, or unspecified chronic kidney disease: Secondary | ICD-10-CM | POA: Diagnosis present

## 2020-05-27 DIAGNOSIS — R2689 Other abnormalities of gait and mobility: Secondary | ICD-10-CM | POA: Diagnosis present

## 2020-05-27 DIAGNOSIS — R2681 Unsteadiness on feet: Secondary | ICD-10-CM | POA: Diagnosis present

## 2020-05-27 DIAGNOSIS — R531 Weakness: Secondary | ICD-10-CM

## 2020-05-27 DIAGNOSIS — Z8673 Personal history of transient ischemic attack (TIA), and cerebral infarction without residual deficits: Secondary | ICD-10-CM

## 2020-05-27 DIAGNOSIS — J9600 Acute respiratory failure, unspecified whether with hypoxia or hypercapnia: Secondary | ICD-10-CM | POA: Diagnosis present

## 2020-05-27 DIAGNOSIS — H548 Legal blindness, as defined in USA: Secondary | ICD-10-CM | POA: Diagnosis present

## 2020-05-27 DIAGNOSIS — R443 Hallucinations, unspecified: Secondary | ICD-10-CM | POA: Diagnosis present

## 2020-05-27 DIAGNOSIS — Z20822 Contact with and (suspected) exposure to covid-19: Secondary | ICD-10-CM | POA: Diagnosis present

## 2020-05-27 DIAGNOSIS — Z85828 Personal history of other malignant neoplasm of skin: Secondary | ICD-10-CM

## 2020-05-27 DIAGNOSIS — Z7902 Long term (current) use of antithrombotics/antiplatelets: Secondary | ICD-10-CM

## 2020-05-27 DIAGNOSIS — E785 Hyperlipidemia, unspecified: Secondary | ICD-10-CM | POA: Diagnosis present

## 2020-05-27 DIAGNOSIS — R296 Repeated falls: Secondary | ICD-10-CM | POA: Diagnosis present

## 2020-05-27 DIAGNOSIS — Z23 Encounter for immunization: Secondary | ICD-10-CM

## 2020-05-27 DIAGNOSIS — Z7982 Long term (current) use of aspirin: Secondary | ICD-10-CM

## 2020-05-27 LAB — CBC WITH DIFFERENTIAL/PLATELET
Abs Immature Granulocytes: 0.03 10*3/uL (ref 0.00–0.07)
Basophils Absolute: 0.1 10*3/uL (ref 0.0–0.1)
Basophils Relative: 0 %
Eosinophils Absolute: 0.1 10*3/uL (ref 0.0–0.5)
Eosinophils Relative: 1 %
HCT: 48 % (ref 39.0–52.0)
Hemoglobin: 15.4 g/dL (ref 13.0–17.0)
Immature Granulocytes: 0 %
Lymphocytes Relative: 14 %
Lymphs Abs: 1.9 10*3/uL (ref 0.7–4.0)
MCH: 28.3 pg (ref 26.0–34.0)
MCHC: 32.1 g/dL (ref 30.0–36.0)
MCV: 88.2 fL (ref 80.0–100.0)
Monocytes Absolute: 1.2 10*3/uL — ABNORMAL HIGH (ref 0.1–1.0)
Monocytes Relative: 9 %
Neutro Abs: 10.5 10*3/uL — ABNORMAL HIGH (ref 1.7–7.7)
Neutrophils Relative %: 76 %
Platelets: 170 10*3/uL (ref 150–400)
RBC: 5.44 MIL/uL (ref 4.22–5.81)
RDW: 15.9 % — ABNORMAL HIGH (ref 11.5–15.5)
WBC: 13.9 10*3/uL — ABNORMAL HIGH (ref 4.0–10.5)
nRBC: 0 % (ref 0.0–0.2)

## 2020-05-27 LAB — COMPREHENSIVE METABOLIC PANEL
ALT: 16 U/L (ref 0–44)
AST: 27 U/L (ref 15–41)
Albumin: 4.1 g/dL (ref 3.5–5.0)
Alkaline Phosphatase: 69 U/L (ref 38–126)
Anion gap: 9 (ref 5–15)
BUN: 19 mg/dL (ref 8–23)
CO2: 24 mmol/L (ref 22–32)
Calcium: 9.4 mg/dL (ref 8.9–10.3)
Chloride: 106 mmol/L (ref 98–111)
Creatinine, Ser: 1.04 mg/dL (ref 0.61–1.24)
GFR, Estimated: >60 mL/min (ref >60)
Glucose, Bld: 113 mg/dL — ABNORMAL HIGH (ref 70–99)
Potassium: 4 mmol/L (ref 3.5–5.1)
Sodium: 139 mmol/L (ref 135–145)
Total Bilirubin: 1.5 mg/dL — ABNORMAL HIGH (ref 0.3–1.2)
Total Protein: 7.2 g/dL (ref 6.5–8.1)

## 2020-05-27 LAB — RESP PANEL BY RT-PCR (FLU A&B, COVID) ARPGX2
Influenza A by PCR: NEGATIVE
Influenza B by PCR: NEGATIVE
SARS Coronavirus 2 by RT PCR: NEGATIVE

## 2020-05-27 LAB — CK: Total CK: 174 U/L (ref 49–397)

## 2020-05-27 LAB — TROPONIN I (HIGH SENSITIVITY): Troponin I (High Sensitivity): 17 ng/L (ref <18)

## 2020-05-27 MED ORDER — TETANUS-DIPHTH-ACELL PERTUSSIS 5-2.5-18.5 LF-MCG/0.5 IM SUSY
0.5000 mL | PREFILLED_SYRINGE | Freq: Once | INTRAMUSCULAR | Status: AC
  Administered 2020-05-28: 0.5 mL via INTRAMUSCULAR
  Filled 2020-05-27: qty 0.5

## 2020-05-27 NOTE — ED Notes (Signed)
Pt in radiology 

## 2020-05-27 NOTE — ED Provider Notes (Signed)
[facility] Emergency Department Provider Note  ____________________________________________   Event Date/Time   First MD Initiated Contact with Patient 05/27/20 2216     (approximate)  I have reviewed the triage vital signs and the nursing notes.   HISTORY  Chief Complaint Fall and Weakness    HPI Steve Bryant is a 87 y.o. male who is legally blind with left greater than right pupil at baseline who comes in for falls.  Patient has had some recurrent mechanical falls.  Patient states that he is just forgetting about the stairs or he got caught by the table.  Patient did hit his head this most recent fall.  Patient is on Plavix he does have a little pain on his left arm as well as small superficial abrasion on his left fingers.  Patient denies any LOC, chest pain, shortness of breath.  Patient does live by himself.  He uses a walker.  Denies any back pain.  Patient's fall yesterday he was on the ground for over 6 hours but he denied any pain after getting back up          Past Medical History:  Diagnosis Date  . AAA (abdominal aortic aneurysm) ([place])   . Arthritis   . Glaucoma   . Hyperlipidemia   . Hypertension   . Squamous cell carcinoma of skin 06/15/2019   left distal medial pretibial. WD SCC. EDC  . Squamous cell carcinoma of skin 06/15/2019   Right calf. KA-type. EDC  . Stroke [facility])     Patient Active Problem List   Diagnosis Date Noted  . Aphasia   . Stenosis of left carotid artery   . Subclavian vein stenosis, right   . TIA (transient ischemic attack) 04/15/2020  . Hallucinations, visual 02/09/2019  . Hyperlipidemia 11/04/2016  . CVA (cerebral vascular accident) ([place]) 10/15/2016  . Essential hypertension, benign 09/18/2016  . Stroke ([place]) 09/18/2016  . AAA (abdominal aortic aneurysm) without rupture ([place]) 09/18/2016  . Gait instability 08/26/2016  . History of COPD 11/22/2013  . SSS (sick sinus syndrome) ([place]) 11/22/2013  .  DDD (degenerative disc disease), lumbar 10/02/2013  . Lumbar radiculitis 10/02/2013  . Arthritis 05/18/2013  . Asthma 05/18/2013  . Degenerative joint disease of cervical and lumbar spine 05/18/2013  . Peripheral neuropathy 05/18/2013  . Renal insufficiency 05/18/2013    Past Surgical History:  Procedure Laterality Date  . EYE SURGERY    . neck fusion    . TUMOR REMOVAL      Prior to Admission medications   Medication Sig Start Date End Date Taking? Authorizing Provider  aspirin EC 81 MG tablet Take 81 mg by mouth daily.    [provider]  clopidogrel (PLAVIX) 75 MG tablet Take 75 mg by mouth daily. 06/09/18   [provider]  dorzolamide-timolol (COSOPT) 22.3-6.8 MG/ML ophthalmic solution INSTILL ONE DROP TWICE DAILY TO BOTH EYES 08/10/16   [provider]  gabapentin (NEURONTIN) 100 MG capsule Take 100 mg by mouth 3 (three) times daily. 05/09/19   [provider]  latanoprost (XALATAN) 0.005 % ophthalmic solution USE 1 DROP IN THE RIGHT EYE AT BEDTIME AS DIRECTED 09/03/16   [provider]  losartan (COZAAR) 25 MG tablet Take 1 tablet (25 mg total) by mouth daily. 10/16/16   Gladstone Lighter, MD  Melatonin 10 MG TABS Take 1 tablet by mouth at bedtime. Patient not taking: No sig reported    [provider]  Multiple Vitamin (MULTIVITAMIN) tablet  Take 1 tablet by mouth daily.    [provider]  MYRBETRIQ 25 MG TB24 tablet Take 25 mg by mouth daily. 03/13/20   [provider]  pantoprazole (PROTONIX) 20 MG tablet Take 20 mg by mouth daily. 01/23/20   [provider]  pregabalin (LYRICA) 75 MG capsule Take 75 mg by mouth 3 (three) times daily. 02/24/19   [provider]  QUEtiapine (SEROQUEL) 25 MG tablet TAKE 3 TABLETS ( 75 MG ) AT DINNER. 07/02/19   [provider]  rosuvastatin (CRESTOR) 5 MG tablet Take 5 mg by mouth daily.    [provider]    Allergies Shellfish-derived  products  Family History  Problem Relation Age of Onset  . Stroke Mother   . Stroke Father   . Cancer Brother     Social History Social History   Tobacco Use  . Smoking status: Former Research scientist (life sciences)  . Smokeless tobacco: Never Used  Vaping Use  . Vaping Use: Never used  Substance Use Topics  . Alcohol use: Not Currently    Comment: occasionally  . Drug use: No      Review of Systems Constitutional: No fever/chills, fall Eyes: No visual changes. ENT: No sore throat. Cardiovascular: Denies chest pain. Respiratory: Denies shortness of breath. Gastrointestinal: No abdominal pain.  No nausea, no vomiting.  No diarrhea.  No constipation. Genitourinary: Negative for dysuria. Musculoskeletal: Negative for back pain.  Some pain on the left forearm Skin: Negative for rash. Neurological: Negative for headaches, focal weakness or numbness. All other ROS negative ____________________________________________   PHYSICAL EXAM:  VITAL SIGNS: Blood pressure (!) 186/94, pulse 88, temperature 98.1 F (36.7 C), temperature source Oral, resp. rate 17, height 5\' 9"  (1.753 m), weight 72.6 kg, SpO2 100 %.  Constitutional: Alert and oriented. Well appearing and in no acute distress. Eyes: Conjunctivae are normal. EOMI. left greater than right pupil at baseline Head: Atraumatic. Nose: No congestion/rhinnorhea. Mouth/Throat: Mucous membranes are moist.   Neck: No stridor. Trachea Midline. FROM Cardiovascular: Normal rate, regular rhythm. Grossly normal heart sounds.  Good peripheral circulation.  No chest wall tenderness Respiratory: Normal respiratory effort.  No retractions. Lungs CTAB. Gastrointestinal: Soft and nontender. No distention. No abdominal bruits.  Musculoskeletal: No lower extremity tenderness nor edema.  No joint effusions.  Small abrasions of the left hand with nothing that need to be sutured, no snuffbox tenderness.  Able to lift both legs up off the bed.  No other  tenderness Neurologic:  Normal speech and language. No gross focal neurologic deficits are appreciated.  Skin:  Skin is warm, dry and intact. No rash noted. Psychiatric: Mood and affect are normal. Speech and behavior are normal. GU: Deferred   ____________________________________________   LABS (all labs ordered are listed, but only abnormal results are displayed)  Labs Reviewed  RESP PANEL BY RT-PCR (FLU A&B, COVID) ARPGX2  CBC WITH DIFFERENTIAL/PLATELET  COMPREHENSIVE METABOLIC PANEL  URINALYSIS, COMPLETE (UACMP) WITH MICROSCOPIC  CK  TROPONIN I (HIGH SENSITIVITY)   ____________________________________________   ED ECG REPORT I, Vanessa Hancock, the attending physician, personally viewed and interpreted this ECG.  Normal sinus rate of 85, no ST elevation, no T wave inversions, type I AV block ____________________________________________  RADIOLOGY Robert Bellow, personally viewed and evaluated these images (plain radiographs) as part of my medical decision making, as well as reviewing the written report by the radiologist.  ED MD interpretation: Pending  Official radiology report(s): No results found.  ____________________________________________   PROCEDURES  Procedure(s) performed (including Critical Care):  .1-3 Lead EKG Interpretation Performed by: Vanessa Elgin, MD Authorized by: Vanessa Oakfield, MD     Interpretation: normal     ECG rate:  80s   ECG rate assessment: normal     Rhythm: sinus rhythm     Ectopy: none     Conduction: normal       ____________________________________________   INITIAL IMPRESSION / ASSESSMENT AND PLAN / ED COURSE  Steve Bryant was evaluated in Emergency Department on 05/27/2020 for the symptoms described in the history of present illness. He was evaluated in the context of the global COVID-19 pandemic, which necessitated consideration that the patient might be at risk for infection with the SARS-CoV-2 virus that  causes COVID-19. Institutional protocols and algorithms that pertain to the evaluation of patients at risk for COVID-19 are in a state of rapid change based on information released by regulatory bodies including the CDC and federal and state organizations. These policies and algorithms were followed during the patient's care in the ED.     Patient is a 88 year old male who is blind who comes in for recurrent falls.  Will get CT head and CT cervical to evaluate for intracranial hemorrhage or cervical fracture.  Denies any chest wall tenderness, abdominal tenderness, back pain to suggest other injuries.  Little bit of pain on his left arm will get some x-rays but no snuffbox tenderness.  Will get labs to evaluate for Electra abnormalities, AKI   Patient is off to oncoming team pending labs, CT, x-rays.  If work-up is negative patient will need ambulation trial for possible discharge home     ____________________________________________   FINAL CLINICAL IMPRESSION(S) / ED DIAGNOSES   Final diagnoses:  Weakness  Fall, initial encounter      MEDICATIONS GIVEN DURING THIS VISIT:  Medications  Tdap (BOOSTRIX) injection 0.5 mL (has no administration in time range)     ED Discharge Orders    None       Note:  This document was prepared using Dragon voice recognition software and may include unintentional dictation errors.   Vanessa Aviston, MD 05/27/20 [phone]

## 2020-05-27 NOTE — ED Triage Notes (Signed)
Pt here by EMS for eval after frequent falls. Pt lives at home alone, blind, family lives next door. Yesterday fell and reports was on the ground 6 hrs before he was able to get up but felt okay so didn't get checked out. Today was walking and his walker got caught on the table corner causing him to fall, did hit his head but no LOC. On blood thinners.

## 2020-05-28 ENCOUNTER — Emergency Department: Payer: Medicare Other

## 2020-05-28 DIAGNOSIS — Z66 Do not resuscitate: Secondary | ICD-10-CM | POA: Diagnosis present

## 2020-05-28 DIAGNOSIS — I714 Abdominal aortic aneurysm, without rupture: Secondary | ICD-10-CM | POA: Diagnosis present

## 2020-05-28 DIAGNOSIS — R2681 Unsteadiness on feet: Secondary | ICD-10-CM | POA: Diagnosis present

## 2020-05-28 DIAGNOSIS — J9601 Acute respiratory failure with hypoxia: Secondary | ICD-10-CM | POA: Diagnosis not present

## 2020-05-28 DIAGNOSIS — H548 Legal blindness, as defined in USA: Secondary | ICD-10-CM | POA: Diagnosis present

## 2020-05-28 DIAGNOSIS — Z79899 Other long term (current) drug therapy: Secondary | ICD-10-CM | POA: Diagnosis not present

## 2020-05-28 DIAGNOSIS — N183 Chronic kidney disease, stage 3 unspecified: Secondary | ICD-10-CM | POA: Diagnosis present

## 2020-05-28 DIAGNOSIS — I10 Essential (primary) hypertension: Secondary | ICD-10-CM | POA: Diagnosis not present

## 2020-05-28 DIAGNOSIS — J189 Pneumonia, unspecified organism: Secondary | ICD-10-CM | POA: Diagnosis present

## 2020-05-28 DIAGNOSIS — Z91013 Allergy to seafood: Secondary | ICD-10-CM | POA: Diagnosis not present

## 2020-05-28 DIAGNOSIS — R443 Hallucinations, unspecified: Secondary | ICD-10-CM | POA: Diagnosis present

## 2020-05-28 DIAGNOSIS — Z20822 Contact with and (suspected) exposure to covid-19: Secondary | ICD-10-CM | POA: Diagnosis present

## 2020-05-28 DIAGNOSIS — R296 Repeated falls: Secondary | ICD-10-CM | POA: Diagnosis present

## 2020-05-28 DIAGNOSIS — E785 Hyperlipidemia, unspecified: Secondary | ICD-10-CM | POA: Diagnosis present

## 2020-05-28 DIAGNOSIS — Z85828 Personal history of other malignant neoplasm of skin: Secondary | ICD-10-CM | POA: Diagnosis not present

## 2020-05-28 DIAGNOSIS — Z85118 Personal history of other malignant neoplasm of bronchus and lung: Secondary | ICD-10-CM | POA: Diagnosis not present

## 2020-05-28 DIAGNOSIS — H409 Unspecified glaucoma: Secondary | ICD-10-CM | POA: Diagnosis present

## 2020-05-28 DIAGNOSIS — I129 Hypertensive chronic kidney disease with stage 1 through stage 4 chronic kidney disease, or unspecified chronic kidney disease: Secondary | ICD-10-CM | POA: Diagnosis present

## 2020-05-28 DIAGNOSIS — A419 Sepsis, unspecified organism: Secondary | ICD-10-CM | POA: Diagnosis present

## 2020-05-28 DIAGNOSIS — Z981 Arthrodesis status: Secondary | ICD-10-CM | POA: Diagnosis not present

## 2020-05-28 DIAGNOSIS — Z823 Family history of stroke: Secondary | ICD-10-CM | POA: Diagnosis not present

## 2020-05-28 DIAGNOSIS — Z8673 Personal history of transient ischemic attack (TIA), and cerebral infarction without residual deficits: Secondary | ICD-10-CM | POA: Diagnosis not present

## 2020-05-28 DIAGNOSIS — J9600 Acute respiratory failure, unspecified whether with hypoxia or hypercapnia: Secondary | ICD-10-CM | POA: Diagnosis present

## 2020-05-28 DIAGNOSIS — Z23 Encounter for immunization: Secondary | ICD-10-CM | POA: Diagnosis present

## 2020-05-28 DIAGNOSIS — Z809 Family history of malignant neoplasm, unspecified: Secondary | ICD-10-CM | POA: Diagnosis not present

## 2020-05-28 DIAGNOSIS — K219 Gastro-esophageal reflux disease without esophagitis: Secondary | ICD-10-CM | POA: Diagnosis present

## 2020-05-28 LAB — URINALYSIS, COMPLETE (UACMP) WITH MICROSCOPIC
Bacteria, UA: NONE SEEN
Bilirubin Urine: NEGATIVE
Glucose, UA: NEGATIVE mg/dL
Hgb urine dipstick: NEGATIVE
Ketones, ur: 5 mg/dL — AB
Leukocytes,Ua: NEGATIVE
Nitrite: NEGATIVE
Protein, ur: NEGATIVE mg/dL
Specific Gravity, Urine: 1.019 (ref 1.005–1.030)
Squamous Epithelial / HPF: NONE SEEN (ref 0–5)
pH: 5 (ref 5.0–8.0)

## 2020-05-28 LAB — LACTIC ACID, PLASMA: Lactic Acid, Venous: 1.5 mmol/L (ref 0.5–1.9)

## 2020-05-28 LAB — PROCALCITONIN: Procalcitonin: <0.1 ng/mL

## 2020-05-28 LAB — TROPONIN I (HIGH SENSITIVITY): Troponin I (High Sensitivity): 24 ng/L — ABNORMAL HIGH (ref <18)

## 2020-05-28 MED ORDER — SODIUM CHLORIDE 0.9% FLUSH: 3.0000 mL | INTRAVENOUS | Status: DC | PRN

## 2020-05-28 MED ORDER — ADULT MULTIVITAMIN W/MINERALS CH
1.0000 | ORAL_TABLET | Freq: Every day | ORAL | Status: DC
  Administered 2020-05-29 – 2020-05-31 (×3): 1 via ORAL
  Filled 2020-05-28 (×3): qty 1

## 2020-05-28 MED ORDER — PANTOPRAZOLE SODIUM 40 MG PO TBEC
40.0000 mg | DELAYED_RELEASE_TABLET | Freq: Every day | ORAL | Status: DC
  Administered 2020-05-28 – 2020-05-31 (×4): 40 mg via ORAL
  Filled 2020-05-28 (×4): qty 1

## 2020-05-28 MED ORDER — PREGABALIN 75 MG PO CAPS
75.0000 mg | ORAL_CAPSULE | Freq: Three times a day (TID) | ORAL | Status: DC
Start: 2020-05-28 — End: 2020-05-31
  Administered 2020-05-28 – 2020-05-31 (×10): 75 mg via ORAL
  Filled 2020-05-28 (×9): qty 1
  Filled 2020-05-28: qty 3

## 2020-05-28 MED ORDER — AMOXICILLIN-POT CLAVULANATE 875-125 MG PO TABS: 1.0000 | ORAL_TABLET | Freq: Two times a day (BID) | ORAL | 0 refills | Status: DC

## 2020-05-28 MED ORDER — ASPIRIN EC 81 MG PO TBEC
81.0000 mg | DELAYED_RELEASE_TABLET | Freq: Every day | ORAL | Status: DC
Start: 2020-05-28 — End: 2020-05-31
  Administered 2020-05-28 – 2020-05-31 (×4): 81 mg via ORAL
  Filled 2020-05-28 (×4): qty 1

## 2020-05-28 MED ORDER — GABAPENTIN 100 MG PO CAPS
100.0000 mg | ORAL_CAPSULE | Freq: Three times a day (TID) | ORAL | Status: DC
  Administered 2020-05-28 – 2020-05-31 (×10): 100 mg via ORAL
  Filled 2020-05-28 (×10): qty 1

## 2020-05-28 MED ORDER — SODIUM CHLORIDE 0.9 % IV SOLN
2.0000 g | Freq: Once | INTRAVENOUS | Status: AC
  Administered 2020-05-28: 2 g via INTRAVENOUS
  Filled 2020-05-28: qty 2

## 2020-05-28 MED ORDER — ENOXAPARIN SODIUM 40 MG/0.4ML ~~LOC~~ SOLN
40.0000 mg | SUBCUTANEOUS | Status: DC
  Administered 2020-05-28 – 2020-05-30 (×3): 40 mg via SUBCUTANEOUS
  Filled 2020-05-28 (×3): qty 0.4

## 2020-05-28 MED ORDER — ROSUVASTATIN CALCIUM 10 MG PO TABS
5.0000 mg | ORAL_TABLET | Freq: Every day | ORAL | Status: DC
  Administered 2020-05-28 – 2020-05-31 (×4): 5 mg via ORAL
  Filled 2020-05-28 (×4): qty 1

## 2020-05-28 MED ORDER — VANCOMYCIN HCL 1250 MG/250ML IV SOLN
1250.0000 mg | Freq: Once | INTRAVENOUS | Status: AC
  Administered 2020-05-28: 1250 mg via INTRAVENOUS
  Filled 2020-05-28: qty 250

## 2020-05-28 MED ORDER — LATANOPROST 0.005 % OP SOLN
1.0000 [drp] | Freq: Every day | OPHTHALMIC | Status: DC
  Administered 2020-05-28 – 2020-05-30 (×3): 1 [drp] via OPHTHALMIC
  Filled 2020-05-28: qty 2.5

## 2020-05-28 MED ORDER — GUAIFENESIN 100 MG/5ML PO SOLN
5.0000 mL | ORAL | Status: DC | PRN
  Administered 2020-05-28: 100 mg via ORAL
  Filled 2020-05-28 (×4): qty 5

## 2020-05-28 MED ORDER — ACETAMINOPHEN 500 MG PO TABS
1000.0000 mg | ORAL_TABLET | Freq: Once | ORAL | Status: AC
  Administered 2020-05-28: 1000 mg via ORAL
  Filled 2020-05-28: qty 2

## 2020-05-28 MED ORDER — SODIUM CHLORIDE 0.9% FLUSH
3.0000 mL | Freq: Two times a day (BID) | INTRAVENOUS | Status: DC
  Administered 2020-05-28 – 2020-05-31 (×7): 3 mL via INTRAVENOUS

## 2020-05-28 MED ORDER — SODIUM CHLORIDE 0.9 % IV SOLN: INTRAVENOUS | Status: DC

## 2020-05-28 MED ORDER — AMOXICILLIN-POT CLAVULANATE 875-125 MG PO TABS: 1.0000 | ORAL_TABLET | Freq: Two times a day (BID) | ORAL | Status: DC

## 2020-05-28 MED ORDER — SODIUM CHLORIDE 0.9 % IV BOLUS (SEPSIS)
500.0000 mL | Freq: Once | INTRAVENOUS | Status: AC
  Administered 2020-05-28: 500 mL via INTRAVENOUS

## 2020-05-28 MED ORDER — LOSARTAN POTASSIUM 25 MG PO TABS
25.0000 mg | ORAL_TABLET | Freq: Every day | ORAL | Status: DC
Start: 2020-05-28 — End: 2020-05-31
  Administered 2020-05-28 – 2020-05-31 (×4): 25 mg via ORAL
  Filled 2020-05-28 (×4): qty 1

## 2020-05-28 MED ORDER — DORZOLAMIDE HCL-TIMOLOL MAL 2-0.5 % OP SOLN
1.0000 [drp] | Freq: Two times a day (BID) | OPHTHALMIC | Status: DC
  Administered 2020-05-28 – 2020-05-31 (×7): 1 [drp] via OPHTHALMIC
  Filled 2020-05-28: qty 10

## 2020-05-28 MED ORDER — METRONIDAZOLE IN NACL 5-0.79 MG/ML-% IV SOLN
500.0000 mg | Freq: Once | INTRAVENOUS | Status: AC
  Administered 2020-05-28: 500 mg via INTRAVENOUS
  Filled 2020-05-28: qty 100

## 2020-05-28 MED ORDER — QUETIAPINE FUMARATE 25 MG PO TABS
25.0000 mg | ORAL_TABLET | Freq: Two times a day (BID) | ORAL | Status: DC
  Administered 2020-05-28 – 2020-05-30 (×5): 25 mg via ORAL
  Filled 2020-05-28 (×5): qty 1

## 2020-05-28 MED ORDER — SODIUM CHLORIDE 0.9 % IV BOLUS
250.0000 mL | Freq: Once | INTRAVENOUS | Status: AC
  Administered 2020-05-28: 250 mL via INTRAVENOUS

## 2020-05-28 MED ORDER — AMOXICILLIN-POT CLAVULANATE 875-125 MG PO TABS
1.0000 | ORAL_TABLET | Freq: Once | ORAL | Status: AC
  Administered 2020-05-28: 1 via ORAL
  Filled 2020-05-28: qty 1

## 2020-05-28 MED ORDER — VANCOMYCIN HCL IN DEXTROSE 1-5 GM/200ML-% IV SOLN: 1000.0000 mg | Freq: Once | INTRAVENOUS | Status: DC

## 2020-05-28 MED ORDER — SODIUM CHLORIDE 0.9 % IV SOLN: 250.0000 mL | INTRAVENOUS | Status: DC | PRN

## 2020-05-28 MED ORDER — SODIUM CHLORIDE 0.9 % IV SOLN
3.0000 g | Freq: Three times a day (TID) | INTRAVENOUS | Status: DC
  Administered 2020-05-28 – 2020-05-29 (×2): 3 g via INTRAVENOUS
  Filled 2020-05-28 (×2): qty 3
  Filled 2020-05-28 (×3): qty 8

## 2020-05-28 MED ORDER — CLOPIDOGREL BISULFATE 75 MG PO TABS
75.0000 mg | ORAL_TABLET | Freq: Every day | ORAL | Status: DC
  Administered 2020-05-28 – 2020-05-31 (×4): 75 mg via ORAL
  Filled 2020-05-28 (×5): qty 1

## 2020-05-28 NOTE — ED Provider Notes (Signed)
-----------------------------------------   10:11 AM on 05/28/2020 -----------------------------------------  Patient is now febrile to 100.6 and noted to be hypoxic to 88 or 89% on room air.  I have ordered a chest x-ray which is showing a streaky right upper lobe opacity.  Patient's white blood cell count was 13.9 upon arrival.  Given the patient is now febrile he meets sepsis criteria.  We will dose IV fluids, send a lactate, blood cultures and a urine culture.  We will cover with broad-spectrum antibiotics.  I discussed the plan of care with the patient and his daughter who are both agreeable to admission.  Patient will be admitted to the hospital service for further work-up and treatment.   Harvest Dark, MD 05/28/20 1013

## 2020-05-28 NOTE — ED Provider Notes (Signed)
-----------------------------------------   12:22 AM on 05/28/2020 -----------------------------------------  Updated patient and his granddaughter on laboratory and imaging results.  Band-Aid removed from right third digit to reveal minor skin tear.  Await repeat troponin.  If negative, patient desires to be discharged home.  Confirm safe discharge as he plans to sleep downstairs and not walk up and down the stairs.  Granddaughter at bedside and affirms this decision.  Family lives next door who can check on him.   Paulette Blanch, MD 05/28/20 [phone]

## 2020-05-28 NOTE — Consult Note (Signed)
PHARMACY -  BRIEF ANTIBIOTIC NOTE   Pharmacy has received consult(s) for vancomycin & cefepime from an ED provider.  The patient's profile has been reviewed for ht/wt/allergies/indication/available labs.    One time order(s) placed for: Vancomycin 1.25g x1 Cefepime 2g x1 Also on Metronidazole 500mg  IV x1  Further antibiotics/pharmacy consults should be ordered by admitting physician if indicated.                       Thank you, Lorna Dibble 05/28/2020  10:04 AM

## 2020-05-28 NOTE — Discharge Instructions (Addendum)
Continue all medications as directed by your doctor.  Return to the ER for recurrent or worsening symptoms, persistent vomiting, difficulty breathing or other concerns. °

## 2020-05-28 NOTE — ED Notes (Signed)
Pt agrees to staying for further eval at this time.

## 2020-05-28 NOTE — ED Notes (Signed)
ED Provider at bedside for re-eval 

## 2020-05-28 NOTE — Consult Note (Signed)
Pharmacy Antibiotic Note  Steve Bryant is a 87 y.o. male admitted on 05/27/2020 with aspiration pneumonia.  Pharmacy has been consulted for Unasyn dosing.  Plan: Received Augmenin x1 (3/22 @0309 ), but with dispo change (VAN/CFP/MTZ x1 in ED (~1000); then will start Unasyn 3g q8h in 8hrs 3/22 @1800  from first doses with planned admission.  Height: 5\' 9"  (175.3 cm) Weight: 72.6 kg (160 lb) IBW/kg (Calculated) : 70.7  Temp (24hrs), Avg:98.9 F (37.2 C), Min:98.1 F (36.7 C), Max:100.6 F (38.1 C)  Recent Labs  Lab 05/27/20 2240  WBC 13.9*  CREATININE 1.04    Estimated Creatinine Clearance: 45.3 mL/min (by C-G formula based on SCr of 1.04 mg/dL).    Allergies  Allergen Reactions  . Shellfish-Derived Products Nausea Only    Scallops    Antimicrobials this admission: Ongoing - Unasyn (3/22>>  completed - Augmentin x1 ED (3/22) - VANC x1 ED (3/22) - CFP x1 ED(3/22) - MTZ x1 ED (3/22)  Dose adjustments this admission: Will CTM for renal fxn  Microbiology results: 3/21 BCx: lost overnight/cancelled  Thank you for allowing pharmacy to be a part of this patient's care.  Lorna Dibble 05/28/2020 10:33 AM

## 2020-05-28 NOTE — H&P (Addendum)
History and Physical    CHAMP KEETCH TLX:[id number] DOB: [birth year] DOA: 05/27/2020  PCP: [name], MD   Patient coming from: Home  I have personally briefly reviewed patient's old medical records in [place]  Chief Complaint: Weakness  HPI: Steve Bryant is a 87 y.o. male with medical history significant for hypertension with complications of chronic kidney disease stage III, glaucoma, dyslipidemia, abdominal aortic aneurysm and history of CVA who presents to the ER for evaluation of weakness with recurrent mechanical falls at home.   Even though patient is legally blind he lives alone and is able to carry out his activities of daily living, his family lives close by and take turns coming in to check on him..  Patient states that his symptoms started 2 days prior to his presentation.  He was discharged to go to his bedroom and fell on his bottom.  Patient was unable to get up and did not activate his life alert.  He stayed on the floor for about 6 hours before finally was able to get there and assist him to bed.  He fell again and the second time struck his head on the ground.   He denies feeling dizzy or lightheaded prior to these episodes.  He denies any loss of consciousness. Patient was assessed in the emergency room and noted to be very unsteady on his feet despite the use of a walker.  Plan was for patient to be assessed by physical therapy to determine a safe disposition in a.m. prior to discharge. While in the emergency room patient developed a fever with a T-max of 100.6.  He also has a wet sounding cough and room air pulse oximetry was noted to be 88% requiring oxygen supplementation at 2 L. He denies having any chest pain, no shortness of breath, no nausea, no vomiting, no abdominal pain, no constipation, no diarrhea, no dizziness, no lightheadedness, no headache, no blurred vision, no difficulty swallowing, no focal deficits Labs show sodium 139, potassium  4.0, chloride 106, bicarb 24, glucose 113, BUN 19, creatinine 1.04, calcium 9.4, alkaline phosphatase 69, albumin 4.1, AST 27, ALT 16, total protein 7.2, total CK 174, troponin 24, white count 13.9, hemoglobin 15.4, hematocrit 48.0, MCV 88.2, RDW 15.9, platelet count 170 Respiratory viral panel is negative CT scan of the head and cervical spine shows No evidence of acute intracranial abnormality. No scalp swelling, calvarial fracture. Sequela of prior right parietal and bilateral cerebellar infarcts as well as remote appearing lacunar type infarcts in the bilateral basal ganglia, thalami and pons. Extensive paranasal sinus disease, left greater than right, with some or pneumatized secretions in the nasal passages as well. Hyperostotic changes of the frontal bone and sinus perimeter. Appearance possibly reflecting combination of acute on chronic sinusitis, correlate with clinical symptoms. No acute fracture or traumatic listhesis of the cervical spine. Prior C3-C7 cervical fusion. Multilevel degenerative changes of the cervical spine as described above. Redemonstration of a 5.1 cm hypoattenuating soft tissue mass at the level of the right lung apex/superior mediastinum, unchanged in appearance from prior. Correlate with patient history and prior evaluation given that this was seen on comparison PET-CT 07/05/2009  demonstrating PET positivity at that time.Emphysema  X-ray of the left forearm and hand shows no fracture Chest x-ray reviewed by me shows new streaky asymmetric pulmonary opacity suspicious for acute viral/atypical respiratory infection, most apparent in the right upper lung. Underlying chronic postoperative changes to the right upper lung. No other acute cardiopulmonary abnormality.  Aortic Atherosclerosis 12-lead EKG reviewed by me shows sinus rhythm with prolonged PR interval.   ED Course: Patient is a 87 year old male who was brought into the ER for evaluation of multiple falls at home  and was noted to have an unsteady gait in the ER despite the use of an assist device.  The plan was for patient to be assessed by physical therapy to get a safe disposition prior to discharge.  While waiting in the ER patient became febrile with a T-max of 100.57F, pulse oximetry dropped to 88% on room air requiring oxygen supplementation and chest x-ray shows an area of opacity in the right upper lobe.  Patient received broad-spectrum antibiotic therapy and will be admitted to the hospital for further evaluation. Review of Systems: As per HPI otherwise all other systems reviewed and negative.    Past Medical History:  Diagnosis Date  . AAA (abdominal aortic aneurysm) ([place])   . Arthritis   . Glaucoma   . Hyperlipidemia   . Hypertension   . Squamous cell carcinoma of skin 06/15/2019   left distal medial pretibial. WD SCC. ED[facility]  . Squamous cell carcinoma of skin 06/15/2019   Right calf. KA-type. ED[facility]  . Stroke [facility])     Past Surgical History:  Procedure Laterality Date  . EYE SURGERY    . neck fusion    . TUMOR REMOVAL       reports that he has quit smoking. He has never used smokeless tobacco. He reports previous alcohol use. He reports that he does not use drugs.  Allergies  Allergen Reactions  . Shellfish-Derived Products Nausea Only    Scallops    Family History  Problem Relation Age of Onset  . Stroke Mother   . Stroke Father   . Cancer Brother       Prior to Admission medications   Medication Sig Start Date End Date Taking? Authorizing Provider  amoxicillin-clavulanate (AUGMENTIN) 875-125 MG tablet Take 1 tablet by mouth 2 (two) times daily. 05/28/20  Yes Paulette Blanch, MD  aspirin EC 81 MG tablet Take 81 mg by mouth daily.   Yes [provider]  clopidogrel (PLAVIX) 75 MG tablet Take 75 mg by mouth daily. 06/09/18  Yes [provider]  dorzolamide-timolol (COSOPT) 22.3-6.8 MG/ML ophthalmic solution Place 1 drop into both eyes 2 (two) times daily. 08/10/16   Yes [provider]  gabapentin (NEURONTIN) 100 MG capsule Take 100 mg by mouth 3 (three) times daily. 05/09/19  Yes [provider]  latanoprost (XALATAN) 0.005 % ophthalmic solution USE 1 DROP IN THE RIGHT EYE AT BEDTIME AS DIRECTED 09/03/16  Yes [provider]  losartan (COZAAR) 25 MG tablet Take 1 tablet (25 mg total) by mouth daily. 10/16/16  Yes Gladstone Lighter, MD  Multiple Vitamin (MULTIVITAMIN) tablet Take 1 tablet by mouth daily.   Yes [provider]  pantoprazole (PROTONIX) 20 MG tablet Take 20 mg by mouth daily. 01/23/20  Yes [provider]  pregabalin (LYRICA) 75 MG capsule Take 75 mg by mouth 3 (three) times daily. 02/24/19  Yes [provider]  QUEtiapine (SEROQUEL) 25 MG tablet Take 25 mg by mouth 2 (two) times daily. 07/02/19  Yes [provider]  Melatonin 10 MG TABS Take 1 tablet by mouth at bedtime. Patient not taking: No sig reported    [provider]  MYRBETRIQ 25 MG TB24 tablet Take 25 mg by mouth daily. Patient not taking: Reported on 05/28/2020 03/13/20   [provider]  rosuvastatin (CRESTOR) 5 MG tablet Take 5 mg by mouth daily.    [provider]    Physical Exam: Vitals:   05/28/20 0629 05/28/20 0800 05/28/20 0942 05/28/20 0948  BP: (!) 178/91 (!) 126/97 (!) 188/99   Pulse: 97 94 96 73  Resp: 20  (!) 22 (!) 21  Temp:   98.8 F (37.1 C) (!) 100.6 F (38.1 C)  TempSrc:   Oral Axillary  SpO2: 95% 93% 90% 98%  Weight:      Height:         Vitals:   05/28/20 0629 05/28/20 0800 05/28/20 0942 05/28/20 0948  BP: (!) 178/91 (!) 126/97 (!) 188/99   Pulse: 97 94 96 73  Resp: 20  (!) 22 (!) 21  Temp:   98.8 F (37.1 C) (!) 100.6 F (38.1 C)  TempSrc:   Oral Axillary  SpO2: 95% 93% 90% 98%  Weight:      Height:          Constitutional: Alert and oriented x 3 .  Has audible rhonchi. Not in any apparent distress HEENT:      Head: Normocephalic and atraumatic.          Eyes: PERLA, EOMI, Conjunctivae are normal. Sclera is non-icteric.       Mouth/Throat: Mucous membranes are moist.       Neck: Supple with no signs of meningismus. Cardiovascular: Regular rate and rhythm. No murmurs, gallops, or rubs. 2+ symmetrical distal pulses are present . No JVD. No LE edema Respiratory: Respiratory effort normal.  Scattered rhonchi mostly over the right lung. No wheezes or crackles Gastrointestinal: Soft, non tender, and non distended with positive bowel sounds.  Genitourinary: No CVA tenderness. Musculoskeletal: Nontender with normal range of motion in all extremities. No cyanosis, or erythema of extremities. Neurologic:  Face is symmetric. Moving all extremities.  Left lower extremity weakness from prior stroke, right arm weakness  skin: Skin is warm, dry.  No rash or ulcers Psychiatric: Mood and affect are normal   Labs on Admission: I have personally reviewed following labs and imaging studies  CBC: Recent Labs  Lab 05/27/20 2240  WBC 13.9*  NEUTROABS 10.5*  HGB 15.4  HCT 48.0  MCV 88.2  PLT 322   Basic Metabolic Panel: Recent Labs  Lab 05/27/20 2240  NA 139  K 4.0  CL 106  CO2 24  GLUCOSE 113*  BUN 19  CREATININE 1.04  CALCIUM 9.4   GFR: Estimated Creatinine Clearance: 45.3 mL/min (by C-G formula based on SCr of 1.04 mg/dL). Liver Function Tests: Recent Labs  Lab 05/27/20 2240  AST 27  ALT 16  ALKPHOS 69  BILITOT 1.5*  PROT 7.2  ALBUMIN 4.1   No results for input(s): LIPASE, AMYLASE in the last 168 hours. No results for input(s): AMMONIA in the last 168 hours. Coagulation Profile: No results for input(s): INR, PROTIME in the last 168 hours. Cardiac Enzymes: Recent Labs  Lab 05/27/20 2240  CKTOTAL 174   BNP (last 3 results) No results for input(s): PROBNP in the last 8760 hours. HbA1C: No results for input(s): HGBA1C in the last 72 hours. CBG: No results for input(s): GLUCAP in the last 168 hours. Lipid Profile: No  results for input(s): CHOL, HDL, LDLCALC, TRIG, CHOLHDL, LDLDIRECT in the last 72 hours. Thyroid Function Tests: No results for input(s): TSH, T4TOTAL, FREET4, T3FREE, THYROIDAB in the last 72 hours. Anemia Panel: No results for input(s): VITAMINB12, FOLATE, FERRITIN, TIBC, IRON,  RETICCTPCT in the last 72 hours. Urine analysis:    Component Value Date/Time   COLORURINE YELLOW (A) 05/27/2020 2238   APPEARANCEUR CLEAR (A) 05/27/2020 2238   LABSPEC 1.019 05/27/2020 2238   PHURINE 5.0 05/27/2020 2238   GLUCOSEU NEGATIVE 05/27/2020 2238   HGBUR NEGATIVE 05/27/2020 2238   BILIRUBINUR NEGATIVE 05/27/2020 2238   KETONESUR 5 (A) 05/27/2020 2238   PROTEINUR NEGATIVE 05/27/2020 2238   NITRITE NEGATIVE 05/27/2020 2238   LEUKOCYTESUR NEGATIVE 05/27/2020 2238    Radiological Exams on Admission: DG Forearm Left  Result Date: 05/27/2020 CLINICAL DATA:  87 year old male with fall and trauma to the left upper extremity. EXAM: LEFT FOREARM - 2 VIEW; LEFT HAND - COMPLETE 3+ VIEW COMPARISON:  None. FINDINGS: There is no acute fracture or dislocation. The bones are osteopenic. Degenerative changes of the base of the thumb. The soft tissues are unremarkable. IMPRESSION: No acute fracture or dislocation. Electronically Signed   By: Anner Crete M.D.   On: 05/27/2020 23:44   CT Head Wo Contrast  Result Date: 05/27/2020 CLINICAL DATA:  Fall with prolonged down time (6 hours) EXAM: CT HEAD WITHOUT CONTRAST CT CERVICAL SPINE WITHOUT CONTRAST TECHNIQUE: Multidetector CT imaging of the head and cervical spine was performed following the standard protocol without intravenous contrast. Multiplanar CT image reconstructions of the cervical spine were also generated. COMPARISON:  CT angiography of the head neck, MRI and CT head 04/15/2020 FINDINGS: CT HEAD FINDINGS Brain: Sequela of prior right parietal and bilateral cerebellar infarcts as well as remote appearing lacunar type infarcts in the bilateral basal ganglia,  thalami and pons. No evidence of acute infarction, hemorrhage, hydrocephalus, extra-axial collection, visible mass lesion or mass effect. Diffuse prominence of the ventricles, cisterns and sulci compatible with parenchymal volume loss. Patchy areas of white matter hypoattenuation are most compatible with chronic microvascular angiopathy. Vascular: Atherosclerotic calcification of the carotid siphons and intradural vertebral arteries. No hyperdense vessel. Skull: No acute scalp swelling, calvarial fracture or new osseous lesion. Expansile and hyperostotic changes of the frontal bone some of which is possibly related to chronic sinus disease as detailed below. Sinuses/Orbits: Extensive opacification throughout the paranasal sinuses, left greater than right with some or pneumatized secretions in the nasal passages as well. Hyperostotic changes likely reflect some chronicity. Orbital structures are unremarkable aside from prior lens extractions. Other: None. CT CERVICAL SPINE FINDINGS Alignment: Preservation of the normal cervical lordosis. Anterior C3-C7 cervical fusion with bony incorporation of spacers from the C4-C7 levels and complete disc height loss C3-4. No evidence of traumatic listhesis. No abnormally widened, perched or jumped facets. Normal alignment of the craniocervical and atlantoaxial articulations. Skull base and vertebrae: No acute skull base fracture. No vertebral body fracture or height loss. Normal bone mineralization. No worrisome osseous lesions. Fusion changes, as above. Spondylitic changes C2-3 and about the dense and craniocervical junctions. Some hypertrophic facet degenerative changes as well. Findings are better detailed below. Soft tissues and spinal canal: No pre or paravertebral fluid or swelling. No visible canal hematoma. Airways patent. Disc levels: Prior fusion C3-C7. Disc height loss and posterior osseous ridging and disc osteophyte complexes C2-3, C3-4 result in some partial  effacement of the ventral thecal sac. Some additional osseous ridging and mild canal narrowing noted diffusely C5-T1. Multilevel uncinate spurring facet hypertrophic changes result in mild-to-moderate multilevel neural foraminal narrowing which is most pronounced at the C5-6 level bilaterally. Upper chest: No acute abnormality in the upper chest or imaged lung apices. Redemonstration of a hypoattenuating 5.1 cm soft  tissue mass at the level of the right lung apex/superior mediastinum, unchanged in appearance from prior. Biapical pleuroparenchymal scarring. Centrilobular emphysematous changes. Surgical clip seen at the interval between the right T1 and T2 transverse processes, unchanged from prior. Calcified pleural plaques. Proximal great vessel atherosclerotic calcifications as well as cervical carotid atherosclerosis. Other: Included portion of the thyroid gland are unremarkable. IMPRESSION: 1. No evidence of acute intracranial abnormality. No scalp swelling, calvarial fracture. 2. Sequela of prior right parietal and bilateral cerebellar infarcts as well as remote appearing lacunar type infarcts in the bilateral basal ganglia, thalami and pons. 3. Extensive paranasal sinus disease, left greater than right, with some or pneumatized secretions in the nasal passages as well. Hyperostotic changes of the frontal bone and sinus perimeter. Appearance possibly reflecting combination of acute on chronic sinusitis, correlate with clinical symptoms. 4. No acute fracture or traumatic listhesis of the cervical spine. 5. Prior C3-C7 cervical fusion. 6. Multilevel degenerative changes of the cervical spine as described above. 7. Redemonstration of a 5.1 cm hypoattenuating soft tissue mass at the level of the right lung apex/superior mediastinum, unchanged in appearance from prior. Correlate with patient history and prior evaluation given that this was seen on comparison PET-CT 07/05/2009 demonstrating PET positivity at that  time. 8. Emphysema (ICD10-J43.9). Electronically Signed   By: Lovena Le M.D.   On: 05/27/2020 23:26   CT Cervical Spine Wo Contrast  Result Date: 05/27/2020 CLINICAL DATA:  Fall with prolonged down time (6 hours) EXAM: CT HEAD WITHOUT CONTRAST CT CERVICAL SPINE WITHOUT CONTRAST TECHNIQUE: Multidetector CT imaging of the head and cervical spine was performed following the standard protocol without intravenous contrast. Multiplanar CT image reconstructions of the cervical spine were also generated. COMPARISON:  CT angiography of the head neck, MRI and CT head 04/15/2020 FINDINGS: CT HEAD FINDINGS Brain: Sequela of prior right parietal and bilateral cerebellar infarcts as well as remote appearing lacunar type infarcts in the bilateral basal ganglia, thalami and pons. No evidence of acute infarction, hemorrhage, hydrocephalus, extra-axial collection, visible mass lesion or mass effect. Diffuse prominence of the ventricles, cisterns and sulci compatible with parenchymal volume loss. Patchy areas of white matter hypoattenuation are most compatible with chronic microvascular angiopathy. Vascular: Atherosclerotic calcification of the carotid siphons and intradural vertebral arteries. No hyperdense vessel. Skull: No acute scalp swelling, calvarial fracture or new osseous lesion. Expansile and hyperostotic changes of the frontal bone some of which is possibly related to chronic sinus disease as detailed below. Sinuses/Orbits: Extensive opacification throughout the paranasal sinuses, left greater than right with some or pneumatized secretions in the nasal passages as well. Hyperostotic changes likely reflect some chronicity. Orbital structures are unremarkable aside from prior lens extractions. Other: None. CT CERVICAL SPINE FINDINGS Alignment: Preservation of the normal cervical lordosis. Anterior C3-C7 cervical fusion with bony incorporation of spacers from the C4-C7 levels and complete disc height loss C3-4. No  evidence of traumatic listhesis. No abnormally widened, perched or jumped facets. Normal alignment of the craniocervical and atlantoaxial articulations. Skull base and vertebrae: No acute skull base fracture. No vertebral body fracture or height loss. Normal bone mineralization. No worrisome osseous lesions. Fusion changes, as above. Spondylitic changes C2-3 and about the dense and craniocervical junctions. Some hypertrophic facet degenerative changes as well. Findings are better detailed below. Soft tissues and spinal canal: No pre or paravertebral fluid or swelling. No visible canal hematoma. Airways patent. Disc levels: Prior fusion C3-C7. Disc height loss and posterior osseous ridging and disc osteophyte complexes C2-3, C3-4  result in some partial effacement of the ventral thecal sac. Some additional osseous ridging and mild canal narrowing noted diffusely C5-T1. Multilevel uncinate spurring facet hypertrophic changes result in mild-to-moderate multilevel neural foraminal narrowing which is most pronounced at the C5-6 level bilaterally. Upper chest: No acute abnormality in the upper chest or imaged lung apices. Redemonstration of a hypoattenuating 5.1 cm soft tissue mass at the level of the right lung apex/superior mediastinum, unchanged in appearance from prior. Biapical pleuroparenchymal scarring. Centrilobular emphysematous changes. Surgical clip seen at the interval between the right T1 and T2 transverse processes, unchanged from prior. Calcified pleural plaques. Proximal great vessel atherosclerotic calcifications as well as cervical carotid atherosclerosis. Other: Included portion of the thyroid gland are unremarkable. IMPRESSION: 1. No evidence of acute intracranial abnormality. No scalp swelling, calvarial fracture. 2. Sequela of prior right parietal and bilateral cerebellar infarcts as well as remote appearing lacunar type infarcts in the bilateral basal ganglia, thalami and pons. 3. Extensive paranasal  sinus disease, left greater than right, with some or pneumatized secretions in the nasal passages as well. Hyperostotic changes of the frontal bone and sinus perimeter. Appearance possibly reflecting combination of acute on chronic sinusitis, correlate with clinical symptoms. 4. No acute fracture or traumatic listhesis of the cervical spine. 5. Prior C3-C7 cervical fusion. 6. Multilevel degenerative changes of the cervical spine as described above. 7. Redemonstration of a 5.1 cm hypoattenuating soft tissue mass at the level of the right lung apex/superior mediastinum, unchanged in appearance from prior. Correlate with patient history and prior evaluation given that this was seen on comparison PET-CT 07/05/2009 demonstrating PET positivity at that time. 8. Emphysema (ICD10-J43.9). Electronically Signed   By: Lovena Le M.D.   On: 05/27/2020 23:26   DG Chest Portable 1 View  Result Date: 05/28/2020 CLINICAL DATA:  87 year old male with cough.  Frequent falls. EXAM: PORTABLE CHEST 1 VIEW COMPARISON:  Portable chest 02/17/2010 and earlier. FINDINGS: Portable AP upright view at 0940 hours. Similar lung volumes and mediastinal contours compared to 2011. Calcified aortic atherosclerosis. Tracheal air column grossly normal. No pneumothorax, pulmonary edema, pleural effusion or consolidation, but there is streaky new asymmetric pulmonary opacity greater on the right. Chronic right apical lung staple line is stable. Surgical clips in the right lower neck are stable. Negative visible bowel gas. No acute osseous abnormality identified. IMPRESSION: 1. New streaky asymmetric pulmonary opacity suspicious for acute viral/atypical respiratory infection, most apparent in the right upper lung. 2. Underlying chronic postoperative changes to the right upper lung. No other acute cardiopulmonary abnormality. Aortic Atherosclerosis (ICD10-I70.0). Electronically Signed   By: Genevie Ann M.D.   On: 05/28/2020 10:07   DG Hand Complete  Left  Result Date: 05/27/2020 CLINICAL DATA:  87 year old male with fall and trauma to the left upper extremity. EXAM: LEFT FOREARM - 2 VIEW; LEFT HAND - COMPLETE 3+ VIEW COMPARISON:  None. FINDINGS: There is no acute fracture or dislocation. The bones are osteopenic. Degenerative changes of the base of the thumb. The soft tissues are unremarkable. IMPRESSION: No acute fracture or dislocation. Electronically Signed   By: Anner Crete M.D.   On: 05/27/2020 23:44     Assessment/Plan Principal Problem:   Pneumonia Active Problems:   Essential hypertension, benign   CVA (cerebral vascular accident) ([place])   Gait instability   Frequent falls    Pneumonia Patient has a wet sounding cough, rhonchi, fever with a T-max of 100.6 F and a white count of 13.6. Lactic acid is within normal limits  Chest x-ray shows an opacity in the right upper lung ??  Possible aspiration pneumonia We will start patient empirically on Unasyn Keep patient n.p.o. and request speech therapy consult for swallow function evaluation Follow-up results of blood cultures    Acute respiratory failure Secondary to pneumonia Patient noted to be tachypneic and has audible rhonchi Room air pulse oximetry was 88% requiring oxygen supplementation at 2 L to maintain pulse oximetry greater than 92%   Hypertension Blood pressure stable Continue Cozaar   History of CVA Continue aspirin, Plavix and statins   Frequent falls/gait instabilityA We will place patient on fall precautions We will request PT evaluation once acute illness improves   GERD Continue Protonix  DVT prophylaxis: Lovenox Code Status: DO NOT RESUSCITATE Family Communication: Greater than 50% of time was spent discussing patient's condition and plan of care with him and his daughter Dorise Bullion at the bedside.  All questions and concerns have been addressed.  They verbalized understanding and agree with the plan.  CODE STATUS was discussed and  patient request to be placed in a DO NOT RESUSCITATE status. Disposition Plan: Back to previous home environment Consults called: Speech therapy/physical therapy Status: At the time of admission, it appears that the appropriate admission status for this patient is inpatient.  This is judged to be reasonable and necessary in order to provide the required intensity of service to ensure the patient's safety given the presenting symptoms, physical exam findings and initial radiographic and laboratory data in the context of their comorbid conditions. Patient requires inpatient status due to high intensity of service, high risk for further deterioration and high frequency of surveillance required.    Collier Bullock MD [facility]     05/28/2020, 10:32 AM

## 2020-05-28 NOTE — ED Notes (Addendum)
Pt unsteady when ambulating with assistance to wheelchair. Pt unsteady when sitting up without assistance. Pt sts he feels weak due to lying down for so long while in EC. Granddaughter at bedside sts pt is usually independent with walker at home and is concerned about URI symptoms.  Dr Beather Arbour notified.   New orders given.

## 2020-05-28 NOTE — Progress Notes (Signed)
PT Cancellation Note  Patient Details Name: Steve Bryant MRN: [id number] DOB: [birth year]   Cancelled Treatment:    Reason Eval/Treat Not Completed: Other (comment). PT visualized pt in room, lab currently working with the patient. Pt also noted to have elevated BP. PT to re-attempt as pt is medically appropriate and available.    Lieutenant Diego PT, DPT 10:22 AM,05/28/20

## 2020-05-28 NOTE — ED Provider Notes (Signed)
-----------------------------------------   12:22 AM on 05/28/2020 -----------------------------------------  Updated patient and his granddaughter on laboratory and imaging results.  Band-Aid removed from right third digit to reveal minor skin tear.  Await repeat troponin.  If negative, patient desires to be discharged home.  Confirm safe discharge as he plans to sleep downstairs and not walk up and down the stairs.  Granddaughter at bedside and affirms this decision.  Family lives next door who can check on him.   ----------------------------------------- 2:16 AM on 05/28/2020 -----------------------------------------  Updated patient and granddaughter on all test results.  Clinically insignificant increase in troponin; patient denies chest pain.  Patient remains steadfast in his desire to be discharged home.  Granddaughter to take patient home.  Strict return precautions given.  Both verbalized understanding and agree with plan of care.   ----------------------------------------- 3:11 AM on 05/28/2020 -----------------------------------------  Patient quite unsteady despite using walker.  Extensive discussion with patient and granddaughter; patient agrees to stay for [facility] evaluation for home health resources.  Reviewed CT head and note paranasal sinus disease; will start Augmentin.   Paulette Blanch, MD 05/28/20 [phone]

## 2020-05-28 NOTE — Sepsis Progress Note (Signed)
[employer] is tracking the code sepsis.   Notified bedside nurse of need to draw lactic acid and Blood Cultures.

## 2020-05-28 NOTE — ED Notes (Signed)
Patient weak and drowsy. Requires significant support to sit at edge of bed to void in urinal. Patient very hot to touch. Vitals rechecked, patient febrile to 100.6 with new oxygen requirement. Placed on 2L [place]. MD notified and to bedside. Report given to Delsa Bern, RN and patient transferred back to main ED for further workup.

## 2020-05-28 NOTE — Consult Note (Signed)
CODE SEPSIS - PHARMACY COMMUNICATION  **Broad Spectrum Antibiotics should be administered within 1 hour of Sepsis diagnosis**  Time Code Sepsis Called/Page Received: 1000  Antibiotics Ordered: 1000  Time of 1st antibiotic administration: 1102  Additional action taken by pharmacy: contacted RN. Confirmed inventory.  If necessary, Name of Provider/Nurse Contacted: RN, Valera Castle ,PharmD Clinical Pharmacist  05/28/2020  10:07 AM

## 2020-05-28 NOTE — ED Notes (Signed)
Pt assisted to sitting position to use urinal at this time.  Attempted to have pt stand and move to right to adjust himself in the bed.  Pt able to stand with extremely unsteady gait and with much assistance from this RN.  Pt very shaky upon standing and unable to move from side to side.

## 2020-05-29 DIAGNOSIS — J9601 Acute respiratory failure with hypoxia: Secondary | ICD-10-CM

## 2020-05-29 DIAGNOSIS — R2681 Unsteadiness on feet: Secondary | ICD-10-CM

## 2020-05-29 DIAGNOSIS — J189 Pneumonia, unspecified organism: Secondary | ICD-10-CM

## 2020-05-29 DIAGNOSIS — R296 Repeated falls: Secondary | ICD-10-CM | POA: Diagnosis not present

## 2020-05-29 LAB — CBC
HCT: 40.9 % (ref 39.0–52.0)
Hemoglobin: 13.4 g/dL (ref 13.0–17.0)
MCH: 29.2 pg (ref 26.0–34.0)
MCHC: 32.8 g/dL (ref 30.0–36.0)
MCV: 89.1 fL (ref 80.0–100.0)
Platelets: 140 10*3/uL — ABNORMAL LOW (ref 150–400)
RBC: 4.59 MIL/uL (ref 4.22–5.81)
RDW: 16.6 % — ABNORMAL HIGH (ref 11.5–15.5)
WBC: 11.2 10*3/uL — ABNORMAL HIGH (ref 4.0–10.5)
nRBC: 0 % (ref 0.0–0.2)

## 2020-05-29 LAB — URINE CULTURE: Culture: <10000 — AB

## 2020-05-29 LAB — BASIC METABOLIC PANEL
Anion gap: 10 (ref 5–15)
BUN: 23 mg/dL (ref 8–23)
CO2: 20 mmol/L — ABNORMAL LOW (ref 22–32)
Calcium: 8.3 mg/dL — ABNORMAL LOW (ref 8.9–10.3)
Chloride: 112 mmol/L — ABNORMAL HIGH (ref 98–111)
Creatinine, Ser: 1.09 mg/dL (ref 0.61–1.24)
GFR, Estimated: >60 mL/min (ref >60)
Glucose, Bld: 83 mg/dL (ref 70–99)
Potassium: 3.4 mmol/L — ABNORMAL LOW (ref 3.5–5.1)
Sodium: 142 mmol/L (ref 135–145)

## 2020-05-29 LAB — HIV ANTIBODY (ROUTINE TESTING W REFLEX): HIV Screen 4th Generation wRfx: NONREACTIVE

## 2020-05-29 LAB — PROCALCITONIN: Procalcitonin: 1.02 ng/mL

## 2020-05-29 MED ORDER — SODIUM CHLORIDE 0.9 % IV SOLN
3.0000 g | Freq: Four times a day (QID) | INTRAVENOUS | Status: DC
  Administered 2020-05-29 – 2020-05-31 (×8): 3 g via INTRAVENOUS
  Filled 2020-05-29: qty 3
  Filled 2020-05-29 (×11): qty 8
  Filled 2020-05-29: qty 3

## 2020-05-29 NOTE — Evaluation (Signed)
Clinical/Bedside Swallow Evaluation Patient Details  Name: Steve Bryant MRN: [id number] Date of Birth: [birth year]  Today's Date: 05/29/2020 Time: SLP Start Time (ACUTE ONLY): 62 SLP Stop Time (ACUTE ONLY): 1125 SLP Time Calculation (min) (ACUTE ONLY): 60 min  Past Medical History:  Past Medical History:  Diagnosis Date  . AAA (abdominal aortic aneurysm) ([place])   . Arthritis   . Glaucoma   . Hyperlipidemia   . Hypertension   . Squamous cell carcinoma of skin 06/15/2019   left distal medial pretibial. WD SCC. EDC  . Squamous cell carcinoma of skin 06/15/2019   Right calf. KA-type. EDC  . Stroke [facility])    Past Surgical History:  Past Surgical History:  Procedure Laterality Date  . EYE SURGERY    . neck fusion    . TUMOR REMOVAL     HPI:  Pt is a 87 y.o. male with medical history significant for hypertension with complications of chronic kidney disease stage III, glaucoma, dyslipidemia, abdominal aortic aneurysm and history of CVA who presents to the ER for evaluation of weakness with recurrent mechanical falls at home.  Even though patient is legally blind, he lives alone and is able to carry out his activities of daily living, his family lives close by and take turns coming in to check on him..  Patient states that his symptoms started 2 days prior to his presentation.  He was attempting to go to his bedroom and fell on his bottom.  Patient was unable to get up and did not activate his life alert.  He stayed on the floor for about 6 hours before family was able to get there and assist him to bed.  He fell again and the second time struck his head on the ground.  He denies feeling dizzy or lightheaded prior to these episodes.  He denies any loss of consciousness.  Patient was assessed in the emergency room and noted to be very unsteady on his feet despite the use of a walker.  CT Cervical Spine 05/28/20: Redemonstration of a 5.1 cm hypoattenuating soft tissue mass at  the level of the  right lung apex/superior mediastinum, unchanged in  appearance from prior; Emphysema; Extensive paranasal sinus disease, left greater than right, with  some or pneumatized secretions in the nasal passages as well; Sequela of prior right parietal and bilateral cerebellar infarcts  as well as remote appearing lacunar type infarcts in the bilateral  basal ganglia, thalami and pons; No evidence of acute intracranial abnormality.   Assessment / Plan / Recommendation Clinical Impression  Pt appears to present w/ adequate oropharyngeal phase swallow function w/ No oropharyngeal phase dysphagia noted, No neuromuscular deficits noted. Pt consumed po trials w/ No overt, clinical s/s of aspiration during po trials. Pt appears at reduced risk for aspiration following general aspiration precautions. Pt has Baseline Vision and RUE(hand) weakness impacting self-feeding abilities -- though overall weakness appears worse w/ decreased proprioceptive awareness during self-feeding. Suspect d/t this recent Fall, illness.  During po trials, pt consumed all consistencies w/ no overt coughing, decline in vocal quality, or change in respiratory presentation during/post trials. Oral phase appeared [facility] w/ timely bolus management, mastication, and control of bolus propulsion for A-P transfer for swallowing. Oral clearing achieved w/ all trial consistencies. OM Exam appeared [facility] w/ no unilateral weakness noted. Speech Clear. Pt fed self w/ setup support. Recommend a Mech Soft consistency diet w/ well-Cut meats, moistened foods (per the Son's request for ease for pt to feed self);  Thin liquids VIA CUP - pt does not use straws at home. Recommend general aspiration precautions, Pills Whole in Puree for safer, easier swallowing if needed for ease of swallowing. Support at meals w/ feeding as needed. Education given on Pills in Puree if needed; food consistencies and easy to eat options; general aspiration precautions. Son/pt agreed. NSG to  reconsult if any new needs arise. NSG agreed. SLP Visit Diagnosis: Dysphagia, unspecified (R13.10)    Aspiration Risk   (reduced following general precautions)    Diet Recommendation  Mech Soft diet for ease eating/self-feeding per Son/pt request; Thin liquids VIA CUP. General aspiration precautions.  Medication Administration: Whole meds with liquid (but Whole in pure if needed for ease of swallowing)    Other  Recommendations Recommended Consults:  (n/a) Oral Care Recommendations: Oral care BID;Staff/trained caregiver to provide oral care Other Recommendations:  (n/a)   Follow up Recommendations None      Frequency and Duration  (n/a)   (n/a)       Prognosis Prognosis for Safe Diet Advancement: Good Barriers to Reach Goals:  (n/a)      Swallow Study   General Date of Onset: 05/27/20 HPI: Pt is a 87 y.o. male with medical history significant for hypertension with complications of chronic kidney disease stage III, glaucoma, dyslipidemia, abdominal aortic aneurysm and history of CVA who presents to the ER for evaluation of weakness with recurrent mechanical falls at home.  Even though patient is legally blind, he lives alone and is able to carry out his activities of daily living, his family lives close by and take turns coming in to check on him..  Patient states that his symptoms started 2 days prior to his presentation.  He was attempting to go to his bedroom and fell on his bottom.  Patient was unable to get up and did not activate his life alert.  He stayed on the floor for about 6 hours before family was able to get there and assist him to bed.  He fell again and the second time struck his head on the ground.  He denies feeling dizzy or lightheaded prior to these episodes.  He denies any loss of consciousness.  Patient was assessed in the emergency room and noted to be very unsteady on his feet despite the use of a walker.  CT Cervical Spine 05/28/20: Redemonstration of a 5.1 cm  hypoattenuating soft tissue mass at  the level of the right lung apex/superior mediastinum, unchanged in  appearance from prior; Emphysema; Extensive paranasal sinus disease, left greater than right, with  some or pneumatized secretions in the nasal passages as well; Sequela of prior right parietal and bilateral cerebellar infarcts  as well as remote appearing lacunar type infarcts in the bilateral  basal ganglia, thalami and pons; No evidence of acute intracranial abnormality. Type of Study: Bedside Swallow Evaluation Previous Swallow Assessment: none Diet Prior to this Study: NPO (Regular diet at home) Temperature Spikes Noted:  (wbc 11.2 declining) Respiratory Status: Room air (off [place] O2 now per pt/son) History of Recent Intubation: No Behavior/Cognition: Alert;Cooperative;Pleasant mood (Vision deficits) Oral Cavity Assessment: Within Functional Limits Oral Care Completed by SLP: Yes Oral Cavity - Dentition: Adequate natural dentition Vision: Functional for self-feeding (supported w/ setup) Self-Feeding Abilities: Able to feed self;Needs assist;Needs set up Patient Positioning: Upright in bed (needed min positioning support and education) Baseline Vocal Quality: Normal Volitional Cough: Strong Volitional Swallow: Able to elicit    Oral/Motor/Sensory Function Overall Oral Motor/Sensory Function: Within functional  limits   Ice Chips Ice chips: Within functional limits Presentation: Spoon (2 trials)   Thin Liquid Thin Liquid: Within functional limits Presentation: Cup;Self Fed (~4 ozs total)    Nectar Thick Nectar Thick Liquid: Not tested   Honey Thick Honey Thick Liquid: Not tested   Puree Puree: Within functional limits Presentation: Spoon (fed; 8 trials)   Solid     Solid: Within functional limits Presentation: Self Fed (supported; 6 trials)       Orinda Kenner, MS, [place] Speech Language Pathologist Rehab Services [phone] Watson,Katherine 05/29/2020,2:54  PM

## 2020-05-29 NOTE — Evaluation (Incomplete)
Physical Therapy Evaluation Patient Details Name: Steve Bryant MRN: [id number] DOB: 1926-01-21 Today's Date: 05/29/2020   History of Present Illness  87 y.o. Caucasian male with medical history significant for Hypertension, dyslipidemia and CVA transient slurred speech with expressive dysphasia yesterday that lasted about 5 minutes. He denied any dysphagia, paresthesias or focal muscle weakness. No witnessed seizures. No urinary or stool incontinence. No tinnitus or vertigo. No nausea or vomiting or abdominal pain. No dysuria, oliguria or hematuria or flank pain. Patient is on aspirin and Plavix    Clinical Impression  Patient alert, in bed with family at bedside. Denied pain. Oriented x4, did have some occasional confusion during session. Denied pain initially but through mobility did report L ankle pain and leg pain due to his fall. Pt and family able to provide clear PLOF; pt previously modI for ADLs with a rollator, has a rotating family schedule where someone is with him and checks on him and usually brings food 5-6x a week. Pt reported being active and walking at home for exercise.  The patient demonstrated RUE weakness compared to LUE, chronic issue. Still able to grasp the RW as needed. Supine to sit with minA for trunk elevation. Able to sit EOB with good balance. Sit <> stand several times during session with minA for RW stabilization (pt accustomed to rollator that locks to assist to stand). Able to walk ~52ft with RW and minA (due to low vision and guidance in environment). Time spent cleaning pt up and changing gown/socks due to loose BM upon initialyl standing, further ambulation deferred due to continued incontinence. Pt up in chair with all needs in reach.  Overall the patient demonstrated deficits (see "PT Problem List") that impede the patient's functional abilities, safety, and mobility and would benefit from skilled PT intervention. Recommendation is HHPT with supervision  intermittently and with mobility/OOB. Family in room, pt and PT discussed importance of assistance at discharge, and pt verbalized that the pt most likely will be able to stay with family for a few days for safety.    Follow Up Recommendations Home health PT;Supervision for mobility/OOB;Supervision - Intermittent    Equipment Recommendations  None recommended by PT    Recommendations for Other Services       Precautions / Restrictions Precautions Precautions: Fall Restrictions Weight Bearing Restrictions: No      Mobility  Bed Mobility Overal bed mobility: Needs Assistance Bed Mobility: Supine to Sit     Supine to sit: Min assist     General bed mobility comments: for trunk elevation    Transfers Overall transfer level: Needs assistance Equipment used: Rolling walker (2 wheeled) Transfers: Sit to/from Stand Sit to Stand: Min assist         General transfer comment: PT stabilizing RW due to pt familiarity with rollator that locks and allows him to pull up on it  Ambulation/Gait Ambulation/Gait assistance: Min assist;Min guard Gait Distance (Feet): 40 Feet Assistive device: Rolling walker (2 wheeled)       General Gait Details: minA for RW guidance due to low vision. pt did not exhibit and LOB with movement. Distance limited by loose BM  Stairs            Wheelchair Mobility    Modified Rankin (Stroke Patients Only)       Balance Overall balance assessment: Needs assistance Sitting-balance support: Feet supported Sitting balance-Leahy Scale: Good       Standing balance-Leahy Scale: Poor Standing balance comment: reliant on UE support  Pertinent Vitals/Pain Pain Assessment: No/denies pain    Home Living Family/patient expects to be discharged to:: Private residence Living Arrangements: Alone Available Help at Discharge: Family;Available PRN/intermittently (family has rotating schedule to check on  patient 5-6x a week) Type of Home: House Home Access: Stairs to enter Entrance Stairs-Rails: Right Entrance Stairs-Number of Steps: 2 Home Layout: Two level;Able to live on main level with bedroom/bathroom Home Equipment: Walker - 4 wheels;Cane - single point;Shower seat      Prior Function Level of Independence: Independent with assistive device(s)         Comments: use of rollator other other modifications within the home to complete mobility and self care tasks at mod I level with visual impairments. Pt has family assist with IADL tasks and cooking.     Hand Dominance   Dominant Hand: Left    Extremity/Trunk Assessment   Upper Extremity Assessment Upper Extremity Assessment: Generalized weakness    Lower Extremity Assessment Lower Extremity Assessment: Generalized weakness    Cervical / Trunk Assessment Cervical / Trunk Assessment: Normal  Communication   Communication: No difficulties  Cognition Arousal/Alertness: Awake/alert Behavior During Therapy: WFL for tasks assessed/performed Overall Cognitive Status: Within Functional Limits for tasks assessed                                 General Comments: family assisted with PLOF as needed, some confusion noted intermittently able to follow all commands      General Comments      Exercises Other Exercises Other Exercises: Pt assisted with gown change, and cleaning legs/sock change after BM upon standing.   Assessment/Plan    PT Assessment Patient needs continued PT services  PT Problem List Decreased strength;Decreased activity tolerance;Decreased balance;Decreased mobility       PT Treatment Interventions DME instruction;Balance training;Neuromuscular re-education;Gait training;Stair training;Functional mobility training;Therapeutic activities;Patient/family education;Therapeutic exercise    PT Goals (Current goals can be found in the Care Plan section)  Acute Rehab PT Goals Patient Stated  Goal: to get his strength back PT Goal Formulation: With patient Time For Goal Achievement: 06/12/20 Potential to Achieve Goals: Good    Frequency Min 2X/week   Barriers to discharge        Co-evaluation               AM-PAC PT "6 Clicks" Mobility  Outcome Measure Help needed turning from your back to your side while in a flat bed without using bedrails?: A Little Help needed moving from lying on your back to sitting on the side of a flat bed without using bedrails?: A Little Help needed moving to and from a bed to a chair (including a wheelchair)?: A Little Help needed standing up from a chair using your arms (e.g., wheelchair or bedside chair)?: A Little Help needed to walk in hospital room?: A Little Help needed climbing 3-5 steps with a railing? : A Lot 6 Click Score: 17    End of Session Equipment Utilized During Treatment: Gait belt Activity Tolerance: Patient tolerated treatment well Patient left: in chair;with call bell/phone within reach;with family/visitor present;with chair alarm set Nurse Communication: Mobility status PT Visit Diagnosis: Other abnormalities of gait and mobility (R26.89);Muscle weakness (generalized) (M62.81);Difficulty in walking, not elsewhere classified (R26.2)    Time: 6629-4765 PT Time Calculation (min) (ACUTE ONLY): 53 min   Charges:   PT Evaluation $PT Eval Low Complexity: 1 Low PT Treatments $Therapeutic Exercise: 23-37 mins  Lieutenant Diego PT, DPT 2:21 PM 05/29/2020

## 2020-05-29 NOTE — Consult Note (Signed)
Pharmacy Antibiotic Note  Steve Bryant is a 87 y.o. male w/ PMH HTN, CKDIII, glaucoma, HLD, AAA, CVA admitted on 05/27/2020 with aspiration pneumonia.  Pharmacy has been consulted for Unasyn dosing.  Plan: adjust Unasyn dose to 3 grams IV every 6 hours   Height: 5\' 9"  (175.3 cm) Weight: 72.6 kg (160 lb) IBW/kg (Calculated) : 70.7  Temp (24hrs), Avg:99.2 F (37.3 C), Min:97.4 F (36.3 C), Max:102.6 F (39.2 C)  Recent Labs  Lab 05/27/20 2240 05/28/20 1036 05/29/20 0621  WBC 13.9*  --  11.2*  CREATININE 1.04  --  1.09  LATICACIDVEN  --  1.5  --     Estimated Creatinine Clearance: 43.2 mL/min (by C-G formula based on SCr of 1.09 mg/dL).    Allergies  Allergen Reactions  . Shellfish-Derived Products Nausea Only    Scallops    Antimicrobials this admission: Ongoing - Unasyn (3/22>>  completed - Augmentin x1 ED (3/22) - VANC x1 ED (3/22) - CFP x1 ED(3/22) - MTZ x1 ED (3/22)  Microbiology results: 3/22 BCx: NG < 24h 3/21 UCx: pending 3/21 SARS CoV-2: negative 3/21 influenza A/B  Thank you for allowing pharmacy to be a part of this patient's care.  Dallie Piles 05/29/2020 7:59 AM

## 2020-05-29 NOTE — Progress Notes (Addendum)
PROGRESS NOTE    Steve Bryant   AFB:[id number]  DOB: [birth year]  PCP: [name], MD    DOA: 05/27/2020 LOS: 1   Brief Narrative    Steve Bryant is a 87 y.o. male with medical history significant for hypertension with complications of chronic kidney disease stage III, glaucoma, dyslipidemia, abdominal aortic aneurysm and history of CVA who presents to the ER for evaluation of weakness with recurrent mechanical falls at home over the past 2 days.    In the ED, patient was unable to ambulate safely with walker, very unsteady.  Later developed a fever.  Imaging consistent with pneumonia, and previously known right lung mass.   Pt dmitted for IV antibiotics and further evaluation.     Assessment & Plan   Principal Problem:   Pneumonia Active Problems:   Essential hypertension, benign   CVA (cerebral vascular accident) ([place])   Gait instability   Frequent falls   Acute respiratory failure ([place])   Sepsis due to Community-acquired Pneumonia (vs ?aspiration) Sepsis POA with fever, leukocytosis, wet sounding cough and imaging c/w PNA.  Lactic acid normal. Chest x-ray shows an opacity in the right upper lung (has prior known lung mass), but given fevers, symptoms, white count, suspect superimposed infection. --Continue Unasyn --Cleared by SLP with no over s/sx's of aspiration, diet started --Follow-up blood cultures --Obtain sputum culture if able  Acute respiratory failure secondary to pneumonia -  On admission, pt was tachypneic with rhonchi on exam. Room air pulse oximetry was 88% requiring oxygen.  --Continue O2 as needed to keep sats greater than 90%, wean as able  Right lung mass - known, seen in chart review notes going back in to 2020.  Will further chart review and obtain further history.    Hypertension - chronic, stable. --Continue Cozaar  History of CVA --Continue aspirin, Plavix and statin  Frequent falls / gait instability / Ambulatory  dysfunction - suspect due to acute illness. --Fall precautions --PT evaluation  Legally blind - likely contributes to falls.  OT eval.  GERD Continue Protonix  Patient BMI: Body mass index is 23.63 kg/m.   DVT prophylaxis: enoxaparin (LOVENOX) injection 40 mg Start: 05/28/20 1200   Diet:  Diet Orders (From admission, onward)    Start     Ordered   05/29/20 1117  DIET DYS 3 Room service appropriate? Yes with Assist; Fluid consistency: Thin  Diet effective now       Comments: NO Straws!!  Please add Gravy to meats, potatoes. Pt may have Omelettes and chicken fingers per Speech.  Question Answer Comment  Room service appropriate? Yes with Assist   Fluid consistency: Thin      05/29/20 1117            Code Status: DNR    Subjective 05/29/20   Pt seen with son at bedside.  Reports feeling a little better, tired and weak.  Describes his recent falls.  Cannot recall how many falls in past 3 months but says he has a lot of them.  Denies f/c, n/v or other acute complaints   Disposition Plan & Communication   Status is: Inpatient  Inpatient status appropriate due to severity of illness requiring IV therapies as outlined above.  Dispo: The patient is from: Home              Anticipated d/c is to: SNF              Patient currently is NOT medically  stable for d/c.   Difficult to place patient No   Family Communication: son at bedside on rounds.   Consults, Procedures, Significant Events   Consultants:     Procedures:     Antimicrobials:  Anti-infectives (From admission, onward)   Start     Dose/Rate Route Frequency Ordered Stop   05/29/20 1200  Ampicillin-Sulbactam (UNASYN) 3 g in sodium chloride 0.9 % 100 mL IVPB        3 g 200 mL/hr over 30 Minutes Intravenous Every 6 hours 05/29/20 0804     05/28/20 1800  Ampicillin-Sulbactam (UNASYN) 3 g in sodium chloride 0.9 % 100 mL IVPB  Status:  Discontinued        3 g 200 mL/hr over 30 Minutes Intravenous Every 8  hours 05/28/20 1040 05/29/20 0804   05/28/20 1015  vancomycin (VANCOREADY) IVPB 1250 mg/250 mL        1,250 mg 166.7 mL/hr over 90 Minutes Intravenous  Once 05/28/20 1006 05/28/20 1427   05/28/20 1000  ceFEPIme (MAXIPIME) 2 g in sodium chloride 0.9 % 100 mL IVPB        2 g 200 mL/hr over 30 Minutes Intravenous  Once 05/28/20 0958 05/28/20 1150   05/28/20 1000  metroNIDAZOLE (FLAGYL) IVPB 500 mg        500 mg 100 mL/hr over 60 Minutes Intravenous  Once 05/28/20 0958 05/28/20 1247   05/28/20 1000  vancomycin (VANCOCIN) IVPB 1000 mg/200 mL premix  Status:  Discontinued        1,000 mg 200 mL/hr over 60 Minutes Intravenous  Once 05/28/20 0958 05/28/20 1006   05/28/20 0315  amoxicillin-clavulanate (AUGMENTIN) 875-125 MG per tablet 1 tablet  Status:  Discontinued        1 tablet Oral Every 12 hours 05/28/20 0313 05/28/20 1008   05/28/20 0300  amoxicillin-clavulanate (AUGMENTIN) 875-125 MG per tablet 1 tablet        1 tablet Oral  Once 05/28/20 0253 05/28/20 0309   05/28/20 0000  amoxicillin-clavulanate (AUGMENTIN) 875-125 MG tablet        1 tablet Oral 2 times daily 05/28/20 0253          Micro    Objective   Vitals:   05/29/20 0758 05/29/20 1011 05/29/20 1149 05/29/20 1544  BP: (!) 142/84 (!) 145/78 133/73 (!) 155/85  Pulse: 83 87 81 90  Resp: 20  20 17   Temp: 97.9 F (36.6 C)  98 F (36.7 C) 98.7 F (37.1 C)  TempSrc:   Oral Oral  SpO2: 94% 90% 92% 92%  Weight:      Height:        Intake/Output Summary (Last 24 hours) at 05/29/2020 1833 Last data filed at 05/29/2020 1555 Gross per 24 hour  Intake 2324.44 ml  Output 300 ml  Net 2024.44 ml   Filed Weights   05/27/20 2235 05/27/20 2236  Weight: 72.6 kg 72.6 kg    Physical Exam:  General exam: awake, alert, no acute distress HEENT: moist mucus membranes, anicteric sclera Respiratory system: diminished R base, right-sided crackles, normal respiratory effort, on room air. Cardiovascular system: normal S1/S2,  RRR Gastrointestinal system: soft, NT, ND, +bowel sounds. Central nervous system: no gross focal neurologic deficits, normal speech Extremities: moves all, normal tone Skin: warm, dry, intact Psychiatry: normal mood, congruent affect  Labs   Data Reviewed: I have personally reviewed following labs and imaging studies  CBC: Recent Labs  Lab 05/27/20 2240 05/29/20 0621  WBC 13.9* 11.2*  NEUTROABS  10.5*  --   HGB 15.4 13.4  HCT 48.0 40.9  MCV 88.2 89.1  PLT 170 419*   Basic Metabolic Panel: Recent Labs  Lab 05/27/20 2240 05/29/20 0621  NA 139 142  K 4.0 3.4*  CL 106 112*  CO2 24 20*  GLUCOSE 113* 83  BUN 19 23  CREATININE 1.04 1.09  CALCIUM 9.4 8.3*   GFR: Estimated Creatinine Clearance: 43.2 mL/min (by C-G formula based on SCr of 1.09 mg/dL). Liver Function Tests: Recent Labs  Lab 05/27/20 2240  AST 27  ALT 16  ALKPHOS 69  BILITOT 1.5*  PROT 7.2  ALBUMIN 4.1   No results for input(s): LIPASE, AMYLASE in the last 168 hours. No results for input(s): AMMONIA in the last 168 hours. Coagulation Profile: No results for input(s): INR, PROTIME in the last 168 hours. Cardiac Enzymes: Recent Labs  Lab 05/27/20 2240  CKTOTAL 174   BNP (last 3 results) No results for input(s): PROBNP in the last 8760 hours. HbA1C: No results for input(s): HGBA1C in the last 72 hours. CBG: No results for input(s): GLUCAP in the last 168 hours. Lipid Profile: No results for input(s): CHOL, HDL, LDLCALC, TRIG, CHOLHDL, LDLDIRECT in the last 72 hours. Thyroid Function Tests: No results for input(s): TSH, T4TOTAL, FREET4, T3FREE, THYROIDAB in the last 72 hours. Anemia Panel: No results for input(s): VITAMINB12, FOLATE, FERRITIN, TIBC, IRON, RETICCTPCT in the last 72 hours. Sepsis Labs: Recent Labs  Lab 05/28/20 0116 05/28/20 1036 05/29/20 0621  PROCALCITON <0.10  --  1.02  LATICACIDVEN  --  1.5  --     Recent Results (from the past 240 hour(s))  Resp Panel by RT-PCR (Flu  A&B, Covid) Nasopharyngeal Swab     Status: None   Collection Time: 05/27/20 10:40 PM   Specimen: Nasopharyngeal Swab; Nasopharyngeal(NP) swabs in vial transport medium  Result Value Ref Range Status   SARS Coronavirus 2 by RT PCR NEGATIVE NEGATIVE Final    Comment: (NOTE) SARS-CoV-2 target nucleic acids are NOT DETECTED.  The SARS-CoV-2 RNA is generally detectable in upper respiratory specimens during the acute phase of infection. The lowest concentration of SARS-CoV-2 viral copies this assay can detect is 138 copies/mL. A negative result does not preclude SARS-Cov-2 infection and should not be used as the sole basis for treatment or other patient management decisions. A negative result may occur with  improper specimen collection/handling, submission of specimen other than nasopharyngeal swab, presence of viral mutation(s) within the areas targeted by this assay, and inadequate number of viral copies(<138 copies/mL). A negative result must be combined with clinical observations, patient history, and epidemiological information. The expected result is Negative.  Fact Sheet for Patients:  EntrepreneurPulse.com.au  Fact Sheet for Healthcare Providers:  IncredibleEmployment.be  This test is no t yet approved or cleared by the [place] FDA and  has been authorized for detection and/or diagnosis of SARS-CoV-2 by FDA under an Emergency Use Authorization (EUA). This EUA will remain  in effect (meaning this test can be used) for the duration of the COVID-19 declaration under Section 564(b)(1) of the Act, 21 U.S.C.section 360bbb-3(b)(1), unless the authorization is terminated  or revoked sooner.       Influenza A by PCR NEGATIVE NEGATIVE Final   Influenza B by PCR NEGATIVE NEGATIVE Final    Comment: (NOTE) The Xpert Xpress SARS-CoV-2/FLU/RSV plus assay is intended as an aid in the diagnosis of influenza from Nasopharyngeal swab specimens  and should not be used as a sole basis  for treatment. Nasal washings and aspirates are unacceptable for Xpert Xpress SARS-CoV-2/FLU/RSV testing.  Fact Sheet for Patients: EntrepreneurPulse.com.au  Fact Sheet for Healthcare Providers: IncredibleEmployment.be  This test is not yet approved or cleared by the [place] FDA and has been authorized for detection and/or diagnosis of SARS-CoV-2 by FDA under an Emergency Use Authorization (EUA). This EUA will remain in effect (meaning this test can be used) for the duration of the COVID-19 declaration under Section 564(b)(1) of the Act, 21 U.S.C. section 360bbb-3(b)(1), unless the authorization is terminated or revoked.  Performed at [facility], [address], [place], [place] [address]   Urine culture     Status: Abnormal   Collection Time: 05/27/20 11:52 PM   Specimen: In/Out Cath Urine  Result Value Ref Range Status   Specimen Description   Final    IN/OUT CATH URINE Performed at [facility], [address] [place], [place] [address]    Special Requests   Final    NONE Performed at [facility], [address] [place], [place] [address]    Culture (A)  Final    <10,000 COLONIES/mL INSIGNIFICANT GROWTH Performed at [facility], [place] [address], [place], [place] [address]    Report Status 05/29/2020 FINAL  Final  Blood Culture (routine x 2)     Status: None (Preliminary result)   Collection Time: 05/28/20 10:40 AM   Specimen: BLOOD  Result Value Ref Range Status   Specimen Description BLOOD RIGHT AC  Final   Special Requests   Final    BOTTLES DRAWN AEROBIC AND ANAEROBIC Blood Culture results may not be optimal due to an excessive volume of blood received in culture bottles   Culture   Final    NO GROWTH < 24 HOURS Performed at [facility], [address], [place], [place] [address]    Report Status PENDING  Incomplete  Blood Culture  (routine x 2)     Status: None (Preliminary result)   Collection Time: 05/28/20 10:49 AM   Specimen: BLOOD  Result Value Ref Range Status   Specimen Description BLOOD LEFT AC  Final   Special Requests   Final    BOTTLES DRAWN AEROBIC AND ANAEROBIC Blood Culture adequate volume   Culture   Final    NO GROWTH < 24 HOURS Performed at [facility], [address], [place],  [address]    Report Status PENDING  Incomplete      Imaging Studies   DG Forearm Left  Result Date: 05/27/2020 CLINICAL DATA:  87 year old male with fall and trauma to the left upper extremity. EXAM: LEFT FOREARM - 2 VIEW; LEFT HAND - COMPLETE 3+ VIEW COMPARISON:  None. FINDINGS: There is no acute fracture or dislocation. The bones are osteopenic. Degenerative changes of the base of the thumb. The soft tissues are unremarkable. IMPRESSION: No acute fracture or dislocation. Electronically Signed   By: Anner Crete M.D.   On: 05/27/2020 23:44   CT Head Wo Contrast  Result Date: 05/27/2020 CLINICAL DATA:  Fall with prolonged down time (6 hours) EXAM: CT HEAD WITHOUT CONTRAST CT CERVICAL SPINE WITHOUT CONTRAST TECHNIQUE: Multidetector CT imaging of the head and cervical spine was performed following the standard protocol without intravenous contrast. Multiplanar CT image reconstructions of the cervical spine were also generated. COMPARISON:  CT angiography of the head neck, MRI and CT head 04/15/2020 FINDINGS: CT HEAD FINDINGS Brain: Sequela of prior right parietal and bilateral cerebellar infarcts as  well as remote appearing lacunar type infarcts in the bilateral basal ganglia, thalami and pons. No evidence of acute infarction, hemorrhage, hydrocephalus, extra-axial collection, visible mass lesion or mass effect. Diffuse prominence of the ventricles, cisterns and sulci compatible with parenchymal volume loss. Patchy areas of white matter hypoattenuation are most compatible with chronic microvascular  angiopathy. Vascular: Atherosclerotic calcification of the carotid siphons and intradural vertebral arteries. No hyperdense vessel. Skull: No acute scalp swelling, calvarial fracture or new osseous lesion. Expansile and hyperostotic changes of the frontal bone some of which is possibly related to chronic sinus disease as detailed below. Sinuses/Orbits: Extensive opacification throughout the paranasal sinuses, left greater than right with some or pneumatized secretions in the nasal passages as well. Hyperostotic changes likely reflect some chronicity. Orbital structures are unremarkable aside from prior lens extractions. Other: None. CT CERVICAL SPINE FINDINGS Alignment: Preservation of the normal cervical lordosis. Anterior C3-C7 cervical fusion with bony incorporation of spacers from the C4-C7 levels and complete disc height loss C3-4. No evidence of traumatic listhesis. No abnormally widened, perched or jumped facets. Normal alignment of the craniocervical and atlantoaxial articulations. Skull base and vertebrae: No acute skull base fracture. No vertebral body fracture or height loss. Normal bone mineralization. No worrisome osseous lesions. Fusion changes, as above. Spondylitic changes C2-3 and about the dense and craniocervical junctions. Some hypertrophic facet degenerative changes as well. Findings are better detailed below. Soft tissues and spinal canal: No pre or paravertebral fluid or swelling. No visible canal hematoma. Airways patent. Disc levels: Prior fusion C3-C7. Disc height loss and posterior osseous ridging and disc osteophyte complexes C2-3, C3-4 result in some partial effacement of the ventral thecal sac. Some additional osseous ridging and mild canal narrowing noted diffusely C5-T1. Multilevel uncinate spurring facet hypertrophic changes result in mild-to-moderate multilevel neural foraminal narrowing which is most pronounced at the C5-6 level bilaterally. Upper chest: No acute abnormality in the  upper chest or imaged lung apices. Redemonstration of a hypoattenuating 5.1 cm soft tissue mass at the level of the right lung apex/superior mediastinum, unchanged in appearance from prior. Biapical pleuroparenchymal scarring. Centrilobular emphysematous changes. Surgical clip seen at the interval between the right T1 and T2 transverse processes, unchanged from prior. Calcified pleural plaques. Proximal great vessel atherosclerotic calcifications as well as cervical carotid atherosclerosis. Other: Included portion of the thyroid gland are unremarkable. IMPRESSION: 1. No evidence of acute intracranial abnormality. No scalp swelling, calvarial fracture. 2. Sequela of prior right parietal and bilateral cerebellar infarcts as well as remote appearing lacunar type infarcts in the bilateral basal ganglia, thalami and pons. 3. Extensive paranasal sinus disease, left greater than right, with some or pneumatized secretions in the nasal passages as well. Hyperostotic changes of the frontal bone and sinus perimeter. Appearance possibly reflecting combination of acute on chronic sinusitis, correlate with clinical symptoms. 4. No acute fracture or traumatic listhesis of the cervical spine. 5. Prior C3-C7 cervical fusion. 6. Multilevel degenerative changes of the cervical spine as described above. 7. Redemonstration of a 5.1 cm hypoattenuating soft tissue mass at the level of the right lung apex/superior mediastinum, unchanged in appearance from prior. Correlate with patient history and prior evaluation given that this was seen on comparison PET-CT 07/05/2009 demonstrating PET positivity at that time. 8. Emphysema (ICD10-J43.9). Electronically Signed   By: Lovena Le M.D.   On: 05/27/2020 23:26   CT Cervical Spine Wo Contrast  Result Date: 05/27/2020 CLINICAL DATA:  Fall with prolonged down time (6 hours) EXAM: CT HEAD WITHOUT CONTRAST  CT CERVICAL SPINE WITHOUT CONTRAST TECHNIQUE: Multidetector CT imaging of the head and  cervical spine was performed following the standard protocol without intravenous contrast. Multiplanar CT image reconstructions of the cervical spine were also generated. COMPARISON:  CT angiography of the head neck, MRI and CT head 04/15/2020 FINDINGS: CT HEAD FINDINGS Brain: Sequela of prior right parietal and bilateral cerebellar infarcts as well as remote appearing lacunar type infarcts in the bilateral basal ganglia, thalami and pons. No evidence of acute infarction, hemorrhage, hydrocephalus, extra-axial collection, visible mass lesion or mass effect. Diffuse prominence of the ventricles, cisterns and sulci compatible with parenchymal volume loss. Patchy areas of white matter hypoattenuation are most compatible with chronic microvascular angiopathy. Vascular: Atherosclerotic calcification of the carotid siphons and intradural vertebral arteries. No hyperdense vessel. Skull: No acute scalp swelling, calvarial fracture or new osseous lesion. Expansile and hyperostotic changes of the frontal bone some of which is possibly related to chronic sinus disease as detailed below. Sinuses/Orbits: Extensive opacification throughout the paranasal sinuses, left greater than right with some or pneumatized secretions in the nasal passages as well. Hyperostotic changes likely reflect some chronicity. Orbital structures are unremarkable aside from prior lens extractions. Other: None. CT CERVICAL SPINE FINDINGS Alignment: Preservation of the normal cervical lordosis. Anterior C3-C7 cervical fusion with bony incorporation of spacers from the C4-C7 levels and complete disc height loss C3-4. No evidence of traumatic listhesis. No abnormally widened, perched or jumped facets. Normal alignment of the craniocervical and atlantoaxial articulations. Skull base and vertebrae: No acute skull base fracture. No vertebral body fracture or height loss. Normal bone mineralization. No worrisome osseous lesions. Fusion changes, as above.  Spondylitic changes C2-3 and about the dense and craniocervical junctions. Some hypertrophic facet degenerative changes as well. Findings are better detailed below. Soft tissues and spinal canal: No pre or paravertebral fluid or swelling. No visible canal hematoma. Airways patent. Disc levels: Prior fusion C3-C7. Disc height loss and posterior osseous ridging and disc osteophyte complexes C2-3, C3-4 result in some partial effacement of the ventral thecal sac. Some additional osseous ridging and mild canal narrowing noted diffusely C5-T1. Multilevel uncinate spurring facet hypertrophic changes result in mild-to-moderate multilevel neural foraminal narrowing which is most pronounced at the C5-6 level bilaterally. Upper chest: No acute abnormality in the upper chest or imaged lung apices. Redemonstration of a hypoattenuating 5.1 cm soft tissue mass at the level of the right lung apex/superior mediastinum, unchanged in appearance from prior. Biapical pleuroparenchymal scarring. Centrilobular emphysematous changes. Surgical clip seen at the interval between the right T1 and T2 transverse processes, unchanged from prior. Calcified pleural plaques. Proximal great vessel atherosclerotic calcifications as well as cervical carotid atherosclerosis. Other: Included portion of the thyroid gland are unremarkable. IMPRESSION: 1. No evidence of acute intracranial abnormality. No scalp swelling, calvarial fracture. 2. Sequela of prior right parietal and bilateral cerebellar infarcts as well as remote appearing lacunar type infarcts in the bilateral basal ganglia, thalami and pons. 3. Extensive paranasal sinus disease, left greater than right, with some or pneumatized secretions in the nasal passages as well. Hyperostotic changes of the frontal bone and sinus perimeter. Appearance possibly reflecting combination of acute on chronic sinusitis, correlate with clinical symptoms. 4. No acute fracture or traumatic listhesis of the  cervical spine. 5. Prior C3-C7 cervical fusion. 6. Multilevel degenerative changes of the cervical spine as described above. 7. Redemonstration of a 5.1 cm hypoattenuating soft tissue mass at the level of the right lung apex/superior mediastinum, unchanged in appearance from prior. Correlate  with patient history and prior evaluation given that this was seen on comparison PET-CT 07/05/2009 demonstrating PET positivity at that time. 8. Emphysema (ICD10-J43.9). Electronically Signed   By: Lovena Le M.D.   On: 05/27/2020 23:26   DG Chest Portable 1 View  Result Date: 05/28/2020 CLINICAL DATA:  87 year old male with cough.  Frequent falls. EXAM: PORTABLE CHEST 1 VIEW COMPARISON:  Portable chest 02/17/2010 and earlier. FINDINGS: Portable AP upright view at 0940 hours. Similar lung volumes and mediastinal contours compared to 2011. Calcified aortic atherosclerosis. Tracheal air column grossly normal. No pneumothorax, pulmonary edema, pleural effusion or consolidation, but there is streaky new asymmetric pulmonary opacity greater on the right. Chronic right apical lung staple line is stable. Surgical clips in the right lower neck are stable. Negative visible bowel gas. No acute osseous abnormality identified. IMPRESSION: 1. New streaky asymmetric pulmonary opacity suspicious for acute viral/atypical respiratory infection, most apparent in the right upper lung. 2. Underlying chronic postoperative changes to the right upper lung. No other acute cardiopulmonary abnormality. Aortic Atherosclerosis (ICD10-I70.0). Electronically Signed   By: Genevie Ann M.D.   On: 05/28/2020 10:07   DG Hand Complete Left  Result Date: 05/27/2020 CLINICAL DATA:  87 year old male with fall and trauma to the left upper extremity. EXAM: LEFT FOREARM - 2 VIEW; LEFT HAND - COMPLETE 3+ VIEW COMPARISON:  None. FINDINGS: There is no acute fracture or dislocation. The bones are osteopenic. Degenerative changes of the base of the thumb. The soft  tissues are unremarkable. IMPRESSION: No acute fracture or dislocation. Electronically Signed   By: Anner Crete M.D.   On: 05/27/2020 23:44     Medications   Scheduled Meds: . aspirin EC  81 mg Oral Daily  . clopidogrel  75 mg Oral Daily  . dorzolamide-timolol  1 drop Both Eyes BID  . enoxaparin (LOVENOX) injection  40 mg Subcutaneous Q24H  . gabapentin  100 mg Oral TID  . latanoprost  1 drop Right Eye QHS  . losartan  25 mg Oral Daily  . multivitamin with minerals  1 tablet Oral Daily  . pantoprazole  40 mg Oral Daily  . pregabalin  75 mg Oral TID  . QUEtiapine  25 mg Oral BID  . rosuvastatin  5 mg Oral Daily  . sodium chloride flush  3 mL Intravenous Q12H   Continuous Infusions: . sodium chloride    . sodium chloride 100 mL/hr at 05/29/20 1555  . ampicillin-sulbactam (UNASYN) IV Stopped (05/29/20 1446)       LOS: 1 day    Time spent: 30 minutes    Ezekiel Slocumb, DO [facility]  05/29/2020, 6:33 PM      If 7PM-7AM, please contact night-coverage. How to contact the [facility] Attending or Consulting provider [place] or covering provider during after hours [place], for this patient?    1. Check the care team in [facility] and look for a) attending/consulting TRH provider listed and b) the [facility] team listed 2. Log into [email or web address] and use [place]'s universal password to access. If you do not have the password, please contact the hospital operator. 3. Locate the [facility] provider you are looking for under [facility] and page to a number that you can be directly reached. 4. If you still have difficulty reaching the provider, please page the [facility] (Director on Call) for the Hospitalists listed on amion for assistance.

## 2020-05-29 NOTE — Progress Notes (Deleted)
Physical Therapy Treatment Patient Details Name: Steve Bryant MRN: [id number] DOB: [birth year] Today's Date: 05/29/2020    History of Present Illness 87 y.o. Caucasian male with medical history significant for Hypertension, dyslipidemia and CVA transient slurred speech with expressive dysphasia yesterday that lasted about 5 minutes. He denied any dysphagia, paresthesias or focal muscle weakness. No witnessed seizures. No urinary or stool incontinence. No tinnitus or vertigo. No nausea or vomiting or abdominal pain. No dysuria, oliguria or hematuria or flank pain. Patient is on aspirin and Plavix    PT Comments    Patient alert, family at bedside, denied pain initially but through mobility did report L ankle pain and leg pain due to his fall. Pt and family able to provide clear PLOF; pt previously modI for ADLs with a rollator, has a rotating family schedule where someone is with him and checks on him and usually brings food 5-6x a week. Pt reported being active and walking at home for exercise.  The patient demonstrated RUE weakness compared to LUE, chronic issue. Still able to grasp the RW as needed. Supine to sit with minA for trunk elevation. Able to sit EOB with good balance. Sit <> stand several times during session with minA for RW stabilization (pt accustomed to rollator that locks to assist to stand). Able to walk ~27ft with RW and minA (due to low vision and guidance in environment). Time spent cleaning pt up and changing gown/socks due to loose BM upon initialyl standing, further ambulation deferred due to continued incontinence. Pt up in chair with all needs in reach.  Overall the patient demonstrated deficits (see "PT Problem List") that impede the patient's functional abilities, safety, and mobility and would benefit from skilled PT intervention. Recommendation is HHPT with supervision intermittently and with mobility/OOB. Family in room, pt and PT discussed importance of assistance at  discharge, and pt verbalized that the pt most likely will be able to stay with family for a few days for safety.      Follow Up Recommendations  Home health PT;Supervision for mobility/OOB;Supervision - Intermittent     Equipment Recommendations  None recommended by PT    Recommendations for Other Services       Precautions / Restrictions Precautions Precautions: Fall Restrictions Weight Bearing Restrictions: No    Mobility  Bed Mobility Overal bed mobility: Needs Assistance Bed Mobility: Supine to Sit     Supine to sit: Min assist     General bed mobility comments: for trunk elevation    Transfers Overall transfer level: Needs assistance Equipment used: Rolling walker (2 wheeled) Transfers: Sit to/from Stand Sit to Stand: Min assist         General transfer comment: PT stabilizing RW due to pt familiarity with rollator that locks and allows him to pull up on it  Ambulation/Gait Ambulation/Gait assistance: Min assist;Min guard Gait Distance (Feet): 40 Feet Assistive device: Rolling walker (2 wheeled)       General Gait Details: minA for RW guidance due to low vision. pt did not exhibit and LOB with movement. Distance limited by loose BM   Stairs             Wheelchair Mobility    Modified Rankin (Stroke Patients Only)       Balance Overall balance assessment: Needs assistance Sitting-balance support: Feet supported Sitting balance-Leahy Scale: Good       Standing balance-Leahy Scale: Poor Standing balance comment: reliant on UE support  Cognition Arousal/Alertness: Awake/alert Behavior During Therapy: WFL for tasks assessed/performed Overall Cognitive Status: Within Functional Limits for tasks assessed                                 General Comments: family assisted with PLOF as needed, some confusion noted intermittently able to follow all commands      Exercises Other  Exercises Other Exercises: Pt assisted with gown change, and cleaning legs/sock change after BM upon standing.    General Comments        Pertinent Vitals/Pain Pain Assessment: No/denies pain    Home Living Family/patient expects to be discharged to:: Private residence Living Arrangements: Alone Available Help at Discharge: Family;Available PRN/intermittently (family has rotating schedule to check on patient 5-6x a week) Type of Home: House Home Access: Stairs to enter Entrance Stairs-Rails: Right Home Layout: Two level;Able to live on main level with bedroom/bathroom Home Equipment: Walker - 4 wheels;Cane - single point;Shower seat      Prior Function Level of Independence: Independent with assistive device(s)      Comments: use of rollator other other modifications within the home to complete mobility and self care tasks at mod I level with visual impairments. Pt has family assist with IADL tasks and cooking.   PT Goals (current goals can now be found in the care plan section) Acute Rehab PT Goals Patient Stated Goal: to get his strength back PT Goal Formulation: With patient Time For Goal Achievement: 06/12/20 Potential to Achieve Goals: Good    Frequency    Min 2X/week      PT Plan      Co-evaluation              AM-PAC PT "6 Clicks" Mobility   Outcome Measure  Help needed turning from your back to your side while in a flat bed without using bedrails?: A Little Help needed moving from lying on your back to sitting on the side of a flat bed without using bedrails?: A Little Help needed moving to and from a bed to a chair (including a wheelchair)?: A Little Help needed standing up from a chair using your arms (e.g., wheelchair or bedside chair)?: A Little Help needed to walk in hospital room?: A Little Help needed climbing 3-5 steps with a railing? : A Lot 6 Click Score: 17    End of Session Equipment Utilized During Treatment: Gait belt Activity  Tolerance: Patient tolerated treatment well Patient left: in chair;with call bell/phone within reach;with family/visitor present;with chair alarm set Nurse Communication: Mobility status PT Visit Diagnosis: Other abnormalities of gait and mobility (R26.89);Muscle weakness (generalized) (M62.81);Difficulty in walking, not elsewhere classified (R26.2)     Time: 3893-7342 PT Time Calculation (min) (ACUTE ONLY): 53 min  Charges:  $Therapeutic Exercise: 23-37 mins                    Lieutenant Diego PT, DPT 3:26 PM,05/29/20

## 2020-05-29 NOTE — Progress Notes (Signed)
PT Cancellation Note  Patient Details Name: Steve Bryant MRN: [id number] DOB: 1926/03/02   Cancelled Treatment:    Reason Eval/Treat Not Completed: Other (comment). Pt with speech at bedside, PT to re-attempt as able.   Lieutenant Diego PT, DPT 10:47 AM,05/29/20

## 2020-05-30 DIAGNOSIS — R2681 Unsteadiness on feet: Secondary | ICD-10-CM | POA: Diagnosis not present

## 2020-05-30 DIAGNOSIS — R296 Repeated falls: Secondary | ICD-10-CM | POA: Diagnosis not present

## 2020-05-30 DIAGNOSIS — I10 Essential (primary) hypertension: Secondary | ICD-10-CM

## 2020-05-30 DIAGNOSIS — J189 Pneumonia, unspecified organism: Secondary | ICD-10-CM | POA: Diagnosis not present

## 2020-05-30 LAB — GLUCOSE, CAPILLARY: Glucose-Capillary: 106 mg/dL — ABNORMAL HIGH (ref 70–99)

## 2020-05-30 LAB — CBC WITH DIFFERENTIAL/PLATELET
Abs Immature Granulocytes: 0.04 10*3/uL (ref 0.00–0.07)
Basophils Absolute: 0.1 10*3/uL (ref 0.0–0.1)
Basophils Relative: 1 %
Eosinophils Absolute: 0.3 10*3/uL (ref 0.0–0.5)
Eosinophils Relative: 4 %
HCT: 40.4 % (ref 39.0–52.0)
Hemoglobin: 13.2 g/dL (ref 13.0–17.0)
Immature Granulocytes: 0 %
Lymphocytes Relative: 19 %
Lymphs Abs: 1.7 10*3/uL (ref 0.7–4.0)
MCH: 28.4 pg (ref 26.0–34.0)
MCHC: 32.7 g/dL (ref 30.0–36.0)
MCV: 87.1 fL (ref 80.0–100.0)
Monocytes Absolute: 1 10*3/uL (ref 0.1–1.0)
Monocytes Relative: 11 %
Neutro Abs: 5.9 10*3/uL (ref 1.7–7.7)
Neutrophils Relative %: 65 %
Platelets: 166 10*3/uL (ref 150–400)
RBC: 4.64 MIL/uL (ref 4.22–5.81)
RDW: 16.5 % — ABNORMAL HIGH (ref 11.5–15.5)
WBC: 9 10*3/uL (ref 4.0–10.5)
nRBC: 0 % (ref 0.0–0.2)

## 2020-05-30 LAB — BASIC METABOLIC PANEL
Anion gap: 8 (ref 5–15)
BUN: 24 mg/dL — ABNORMAL HIGH (ref 8–23)
CO2: 21 mmol/L — ABNORMAL LOW (ref 22–32)
Calcium: 8.6 mg/dL — ABNORMAL LOW (ref 8.9–10.3)
Chloride: 112 mmol/L — ABNORMAL HIGH (ref 98–111)
Creatinine, Ser: 1.02 mg/dL (ref 0.61–1.24)
GFR, Estimated: >60 mL/min (ref >60)
Glucose, Bld: 102 mg/dL — ABNORMAL HIGH (ref 70–99)
Potassium: 3.1 mmol/L — ABNORMAL LOW (ref 3.5–5.1)
Sodium: 141 mmol/L (ref 135–145)

## 2020-05-30 LAB — PROCALCITONIN: Procalcitonin: 0.68 ng/mL

## 2020-05-30 LAB — MAGNESIUM: Magnesium: 2 mg/dL (ref 1.7–2.4)

## 2020-05-30 MED ORDER — POTASSIUM CHLORIDE 20 MEQ PO PACK
40.0000 meq | PACK | ORAL | Status: DC
  Administered 2020-05-30: 40 meq via ORAL
  Filled 2020-05-30: qty 2

## 2020-05-30 MED ORDER — QUETIAPINE FUMARATE 25 MG PO TABS
25.0000 mg | ORAL_TABLET | Freq: Every day | ORAL | Status: DC
  Administered 2020-05-30: 25 mg via ORAL
  Filled 2020-05-30: qty 1

## 2020-05-30 MED ORDER — HYDRALAZINE HCL 20 MG/ML IJ SOLN
10.0000 mg | Freq: Four times a day (QID) | INTRAMUSCULAR | Status: DC | PRN
  Administered 2020-05-30 – 2020-05-31 (×2): 10 mg via INTRAVENOUS
  Filled 2020-05-30 (×2): qty 1

## 2020-05-30 MED ORDER — QUETIAPINE FUMARATE 25 MG PO TABS: 25.0000 mg | ORAL_TABLET | Freq: Every day | ORAL | Status: DC

## 2020-05-30 MED ORDER — POTASSIUM CHLORIDE CRYS ER 20 MEQ PO TBCR
40.0000 meq | EXTENDED_RELEASE_TABLET | ORAL | Status: AC
  Administered 2020-05-30 (×2): 40 meq via ORAL
  Filled 2020-05-30 (×2): qty 2

## 2020-05-30 MED ORDER — HALOPERIDOL LACTATE 5 MG/ML IJ SOLN
2.5000 mg | Freq: Once | INTRAMUSCULAR | Status: AC
  Administered 2020-05-30: 2.5 mg via INTRAVENOUS
  Filled 2020-05-30: qty 1

## 2020-05-30 NOTE — TOC Transition Note (Signed)
Transition of Care [facility]) - CM/SW Discharge Note   Patient Details  Name: Steve Bryant MRN: [id number] Date of Birth: 1925/08/05  Transition of Care [facility]) CM/SW Contact:  Eileen Stanford, LCSW Phone Number: 05/30/2020, 10:36 AM   Clinical Narrative: CSW spoke with pt's son. Pt's son states pt will need HH at d/c. Pt's son did not have a agency preference. Referral given to [facility] with [facility]. Pt has no DME needs. Pt's son states pt has a walker at home.        Barriers to Discharge: Continued Medical Work up   Patient Goals and CMS Choice Patient states their goals for this hospitalization and ongoing recovery are:: to get better   Choice offered to / list presented to : Adult Children  Discharge Placement                       Discharge Plan and Services In-house Referral: Clinical Social Work   Post Acute Care Choice: Home Health                    HH Arranged: PT [place] Agency: [place] ([facility]) Date [place]: 05/30/20 Time [place]: 1035 Representative spoke with at [place]: [place] ([place]) Interventions     Readmission Risk Interventions No flowsheet data found.

## 2020-05-30 NOTE — Progress Notes (Signed)
PROGRESS NOTE    Steve Bryant   QQI:[id number]  DOB: Jan 29, 1926  PCP: [name], MD    DOA: 05/27/2020 LOS: 2   Brief Narrative    Steve Bryant is a 87 y.o. male with medical history significant for hypertension with complications of chronic kidney disease stage III, glaucoma, dyslipidemia, abdominal aortic aneurysm and history of CVA who presents to the ER for evaluation of weakness with recurrent mechanical falls at home over the past 2 days.    In the ED, patient was unable to ambulate safely with walker, very unsteady.  Later developed a fever.  Imaging consistent with pneumonia, and previously known right lung mass.   Pt dmitted for IV antibiotics and further evaluation.     Assessment & Plan   Principal Problem:   Pneumonia Active Problems:   Essential hypertension, benign   CVA (cerebral vascular accident) ([place])   Gait instability   Frequent falls   Acute respiratory failure ([place])   Sepsis due to Community-acquired Pneumonia (vs ?aspiration) Sepsis POA with fever, leukocytosis, wet sounding cough and imaging c/w PNA.  Lactic acid normal. Chest x-ray shows an opacity in the right upper lung (has prior known lung mass), but given fevers, symptoms, white count, suspect superimposed infection. --Continue Unasyn  --Augmentin at [place] complete 5-day course --Cleared by SLP with no over s/sx's of aspiration, diet started --Follow-up blood cultures -no growth to date  Acute respiratory failure secondary to pneumonia -  On admission, pt was tachypneic with rhonchi on exam. Room air pulse oximetry was 88% requiring oxygen.  --Continue O2 as needed to keep sats greater than 90%, wean as able  Right lung mass -remote history of lung cancer status post resection.  No acute issues but may contribute to pneumonia. Patient and son report this was in the 78s.  Hypertension - chronic, stable. --Continue Cozaar  History of CVA --Continue aspirin,  Plavix and statin  Frequent falls / gait instability / Ambulatory dysfunction - suspect due to acute illness. --Fall precautions --PT evaluation-home health PT recommended  Legally blind - likely contributes to falls.  OT eval -no follow-up recommended.  GERD Continue Protonix  Patient BMI: Body mass index is 23.63 kg/m.   DVT prophylaxis: enoxaparin (LOVENOX) injection 40 mg Start: 05/28/20 1200   Diet:  Diet Orders (From admission, onward)    Start     Ordered   05/29/20 1117  DIET DYS 3 Room service appropriate? Yes with Assist; Fluid consistency: Thin  Diet effective now       Comments: NO Straws!!  Please add Gravy to meats, potatoes. Pt may have Omelettes and chicken fingers per Speech.  Question Answer Comment  Room service appropriate? Yes with Assist   Fluid consistency: Thin      05/29/20 1117            Code Status: DNR    Subjective 05/30/20    Pt seen with son at bedside.  Overnight he had some hallucinations.  He has history of these.  Son request that his Seroquel be scheduled the way he takes at home as it helps prevent the hallucinations.  Patient is typically aware when he is having these and sometimes will call his son.  Patient reports denies shortness of breath or cough, fevers or chills, nausea or vomiting.  Disposition Plan & Communication   Status is: Inpatient  Inpatient status appropriate due to severity of illness requiring IV therapies as outlined above.  Patient will require 24-hour  supervision after discharge, lives alone.  Will discharge home with family who are preparing the home for him.  Anticipate discharge tomorrow.  Dispo: The patient is from: Home              Anticipated d/c is to: Home with family, HHPT              Patient currently is NOT medically stable for d/c.   Difficult to place patient No   Family Communication: son at bedside on rounds.   Consults, Procedures, Significant Events   Consultants:    None  Procedures:   None  Antimicrobials:  Anti-infectives (From admission, onward)   Start     Dose/Rate Route Frequency Ordered Stop   05/29/20 1200  Ampicillin-Sulbactam (UNASYN) 3 g in sodium chloride 0.9 % 100 mL IVPB        3 g 200 mL/hr over 30 Minutes Intravenous Every 6 hours 05/29/20 0804     05/28/20 1800  Ampicillin-Sulbactam (UNASYN) 3 g in sodium chloride 0.9 % 100 mL IVPB  Status:  Discontinued        3 g 200 mL/hr over 30 Minutes Intravenous Every 8 hours 05/28/20 1040 05/29/20 0804   05/28/20 1015  vancomycin (VANCOREADY) IVPB 1250 mg/250 mL        1,250 mg 166.7 mL/hr over 90 Minutes Intravenous  Once 05/28/20 1006 05/28/20 1427   05/28/20 1000  ceFEPIme (MAXIPIME) 2 g in sodium chloride 0.9 % 100 mL IVPB        2 g 200 mL/hr over 30 Minutes Intravenous  Once 05/28/20 0958 05/28/20 1150   05/28/20 1000  metroNIDAZOLE (FLAGYL) IVPB 500 mg        500 mg 100 mL/hr over 60 Minutes Intravenous  Once 05/28/20 0958 05/28/20 1247   05/28/20 1000  vancomycin (VANCOCIN) IVPB 1000 mg/200 mL premix  Status:  Discontinued        1,000 mg 200 mL/hr over 60 Minutes Intravenous  Once 05/28/20 0958 05/28/20 1006   05/28/20 0315  amoxicillin-clavulanate (AUGMENTIN) 875-125 MG per tablet 1 tablet  Status:  Discontinued        1 tablet Oral Every 12 hours 05/28/20 0313 05/28/20 1008   05/28/20 0300  amoxicillin-clavulanate (AUGMENTIN) 875-125 MG per tablet 1 tablet        1 tablet Oral  Once 05/28/20 0253 05/28/20 0309   05/28/20 0000  amoxicillin-clavulanate (AUGMENTIN) 875-125 MG tablet        1 tablet Oral 2 times daily 05/28/20 0253          Micro    Objective   Vitals:   05/29/20 2012 05/30/20 0434 05/30/20 0900 05/30/20 1147  BP: (!) 154/56 (!) 151/74 123/86 (!) 156/90  Pulse: 95 93 (!) 107 76  Resp: (!) 22 14    Temp: 100.2 F (37.9 C) 98 F (36.7 C) 98.4 F (36.9 C) (!) 97.4 F (36.3 C)  TempSrc: Oral Oral Oral Oral  SpO2: 92% (!) 87% 93% 95%  Weight:       Height:        Intake/Output Summary (Last 24 hours) at 05/30/2020 1753 Last data filed at 05/30/2020 2355 Gross per 24 hour  Intake 694.22 ml  Output 400 ml  Net 294.22 ml   Filed Weights   05/27/20 2235 05/27/20 2236  Weight: 72.6 kg 72.6 kg    Physical Exam:  General exam: awake, alert, no acute distress Respiratory system: Diminished bases, right-sided crackles, normal respiratory effort, on room  air. Cardiovascular system: normal S1/S2, RRR, no peripheral edema Gastrointestinal system: soft, NT, ND Central nervous system: no gross focal neurologic deficits, normal speech Psychiatry: normal mood, congruent affect  Labs   Data Reviewed: I have personally reviewed following labs and imaging studies  CBC: Recent Labs  Lab 05/27/20 2240 05/29/20 0621 05/30/20 0636  WBC 13.9* 11.2* 9.0  NEUTROABS 10.5*  --  5.9  HGB 15.4 13.4 13.2  HCT 48.0 40.9 40.4  MCV 88.2 89.1 87.1  PLT 170 140* 503   Basic Metabolic Panel: Recent Labs  Lab 05/27/20 2240 05/29/20 0621 05/30/20 0636  NA 139 142 141  K 4.0 3.4* 3.1*  CL 106 112* 112*  CO2 24 20* 21*  GLUCOSE 113* 83 102*  BUN 19 23 24*  CREATININE 1.04 1.09 1.02  CALCIUM 9.4 8.3* 8.6*  MG  --   --  2.0   GFR: Estimated Creatinine Clearance: 46.2 mL/min (by C-G formula based on SCr of 1.02 mg/dL). Liver Function Tests: Recent Labs  Lab 05/27/20 2240  AST 27  ALT 16  ALKPHOS 69  BILITOT 1.5*  PROT 7.2  ALBUMIN 4.1   No results for input(s): LIPASE, AMYLASE in the last 168 hours. No results for input(s): AMMONIA in the last 168 hours. Coagulation Profile: No results for input(s): INR, PROTIME in the last 168 hours. Cardiac Enzymes: Recent Labs  Lab 05/27/20 2240  CKTOTAL 174   BNP (last 3 results) No results for input(s): PROBNP in the last 8760 hours. HbA1C: No results for input(s): HGBA1C in the last 72 hours. CBG: Recent Labs  Lab 05/30/20 1144  GLUCAP 106*   Lipid Profile: No results for  input(s): CHOL, HDL, LDLCALC, TRIG, CHOLHDL, LDLDIRECT in the last 72 hours. Thyroid Function Tests: No results for input(s): TSH, T4TOTAL, FREET4, T3FREE, THYROIDAB in the last 72 hours. Anemia Panel: No results for input(s): VITAMINB12, FOLATE, FERRITIN, TIBC, IRON, RETICCTPCT in the last 72 hours. Sepsis Labs: Recent Labs  Lab 05/28/20 0116 05/28/20 1036 05/29/20 0621 05/30/20 0636  PROCALCITON <0.10  --  1.02 0.68  LATICACIDVEN  --  1.5  --   --     Recent Results (from the past 240 hour(s))  Resp Panel by RT-PCR (Flu A&B, Covid) Nasopharyngeal Swab     Status: None   Collection Time: 05/27/20 10:40 PM   Specimen: Nasopharyngeal Swab; Nasopharyngeal(NP) swabs in vial transport medium  Result Value Ref Range Status   SARS Coronavirus 2 by RT PCR NEGATIVE NEGATIVE Final    Comment: (NOTE) SARS-CoV-2 target nucleic acids are NOT DETECTED.  The SARS-CoV-2 RNA is generally detectable in upper respiratory specimens during the acute phase of infection. The lowest concentration of SARS-CoV-2 viral copies this assay can detect is 138 copies/mL. A negative result does not preclude SARS-Cov-2 infection and should not be used as the sole basis for treatment or other patient management decisions. A negative result may occur with  improper specimen collection/handling, submission of specimen other than nasopharyngeal swab, presence of viral mutation(s) within the areas targeted by this assay, and inadequate number of viral copies(<138 copies/mL). A negative result must be combined with clinical observations, patient history, and epidemiological information. The expected result is Negative.  Fact Sheet for Patients:  EntrepreneurPulse.com.au  Fact Sheet for Healthcare Providers:  IncredibleEmployment.be  This test is no t yet approved or cleared by the [place] FDA and  has been authorized for detection and/or diagnosis of SARS-CoV-2 by FDA  under an Emergency Use Authorization (EUA).  This EUA will remain  in effect (meaning this test can be used) for the duration of the COVID-19 declaration under Section 564(b)(1) of the Act, 21 U.S.C.section 360bbb-3(b)(1), unless the authorization is terminated  or revoked sooner.       Influenza A by PCR NEGATIVE NEGATIVE Final   Influenza B by PCR NEGATIVE NEGATIVE Final    Comment: (NOTE) The Xpert Xpress SARS-CoV-2/FLU/RSV plus assay is intended as an aid in the diagnosis of influenza from Nasopharyngeal swab specimens and should not be used as a sole basis for treatment. Nasal washings and aspirates are unacceptable for Xpert Xpress SARS-CoV-2/FLU/RSV testing.  Fact Sheet for Patients: EntrepreneurPulse.com.au  Fact Sheet for Healthcare Providers: IncredibleEmployment.be  This test is not yet approved or cleared by the [place] FDA and has been authorized for detection and/or diagnosis of SARS-CoV-2 by FDA under an Emergency Use Authorization (EUA). This EUA will remain in effect (meaning this test can be used) for the duration of the COVID-19 declaration under Section 564(b)(1) of the Act, 21 U.S.C. section 360bbb-3(b)(1), unless the authorization is terminated or revoked.  Performed at [facility], [address] [place], [place] [address]   Urine culture     Status: Abnormal   Collection Time: 05/27/20 11:52 PM   Specimen: In/Out Cath Urine  Result Value Ref Range Status   Specimen Description   Final    IN/OUT CATH URINE Performed at [facility], [address], [place], [place] [address]    Special Requests   Final    NONE Performed at [facility], [address] [place], [place] [address]    Culture (A)  Final    <10,000 COLONIES/mL INSIGNIFICANT GROWTH Performed at [facility], [place] [address] [place], [place] [address]    Report Status 05/29/2020 FINAL  Final  Blood Culture  (routine x 2)     Status: None (Preliminary result)   Collection Time: 05/28/20 10:40 AM   Specimen: BLOOD  Result Value Ref Range Status   Specimen Description BLOOD RIGHT AC  Final   Special Requests   Final    BOTTLES DRAWN AEROBIC AND ANAEROBIC Blood Culture results may not be optimal due to an excessive volume of blood received in culture bottles   Culture   Final    NO GROWTH 2 DAYS Performed at [facility], [address] [place], [place] [address]    Report Status PENDING  Incomplete  Blood Culture (routine x 2)     Status: None (Preliminary result)   Collection Time: 05/28/20 10:49 AM   Specimen: BLOOD  Result Value Ref Range Status   Specimen Description BLOOD LEFT [facility]  Final   Special Requests   Final    BOTTLES DRAWN AEROBIC AND ANAEROBIC Blood Culture adequate volume   Culture   Final    NO GROWTH 2 DAYS Performed at [facility], [address], [place], [place] [address]    Report Status PENDING  Incomplete      Imaging Studies   No results found.   Medications   Scheduled Meds: . aspirin EC  81 mg Oral Daily  . clopidogrel  75 mg Oral Daily  . dorzolamide-timolol  1 drop Both Eyes BID  . enoxaparin (LOVENOX) injection  40 mg Subcutaneous Q24H  . gabapentin  100 mg Oral TID  . latanoprost  1 drop Right Eye QHS  . losartan  25 mg Oral Daily  . multivitamin with minerals  1  tablet Oral Daily  . pantoprazole  40 mg Oral Daily  . pregabalin  75 mg Oral TID  . [START ON 05/31/2020] QUEtiapine  25 mg Oral QHS  . QUEtiapine  25 mg Oral Q supper  . rosuvastatin  5 mg Oral Daily  . sodium chloride flush  3 mL Intravenous Q12H   Continuous Infusions: . sodium chloride    . ampicillin-sulbactam (UNASYN) IV 3 g (05/30/20 1525)       LOS: 2 days    Time spent: 30 minutes with greater than 50% spent at bedside and in coordination of care.    Ezekiel Slocumb, DO [facility]  05/30/2020, 5:53 PM      If 7PM-7AM, please  contact night-coverage. How to contact the [facility] Attending or Consulting provider [place] or covering provider during after hours [place], for this patient?    1. Check the care team in [facility] and look for a) attending/consulting TRH provider listed and b) the [facility] team listed 2. Log into [email or web address] and use [place]'s universal password to access. If you do not have the password, please contact the hospital operator. 3. Locate the [facility] provider you are looking for under [facility] and page to a number that you can be directly reached. 4. If you still have difficulty reaching the provider, please page the [facility] (Director on Call) for the Hospitalists listed on amion for assistance.

## 2020-05-30 NOTE — Evaluation (Signed)
Occupational Therapy Evaluation Patient Details Name: Steve Bryant MRN: [id number] DOB: 09/04/1925 Today's Date: 05/30/2020    History of Present Illness Steve Bryant is a 87 y.o. male who presents to ED following two mechanical falls in his home, two days prior to coming to ED. During first incident, pt fell on his bottom and was unable to get up or get to his life-alert button. He remained on the floor for ~ 6 hours, until one of his children came by his home. He then fell again later in the day, this time hitting his head on the ground. PMHx includes hypertension with complications of chronic kidney disease stage III, glaucoma, blindness, dyslipidemia, abdominal aortic aneurysm and history of CVA.   Clinical Impression   Steve Bryant is pleasant, alert, in bed with son at bedside. Denies pain. Generally oriented to time, place, and situation, although displays slight confusion at times. Pt lives alone, in a 2-story home, with his bedroom on 2nd story. Family members come by to visit 5-6 days/wk and assist with IADL (cooking, cleaning, shopping), but pt and son report that pt manages all ADL INDly, using a rollator or RW at home, navigating stairs himself several times each day, walking down driveway to mailbox each day, taking out his trash 1x/week. Pt was an active golfer until 3 years ago, when he became unable to see the ball. Pt demonstrates weakness in b/l UE, R side weaker than L. Requires assistance to lift/lower LE during supine<>sit. Standing balance fair. Pt and son report that pt is uncomfortable with strangers coming into his home, and therefore are not interested in [facility]. Pt can still benefit from ongoing OT while hospitalized, to assist with balance, strength, endurance, and educ re falls prevention and home safety.    Follow Up Recommendations  No OT follow up    Equipment Recommendations       Recommendations for Other Services       Precautions / Restrictions  Precautions Precautions: Fall Precaution Comments: note: pt with visual impairment; vision only in periphery of R eye Restrictions Weight Bearing Restrictions: No      Mobility Bed Mobility Overal bed mobility: Needs Assistance Bed Mobility: Supine to Sit;Sit to Supine     Supine to sit: Min assist Sit to supine: Min assist   General bed mobility comments: requires Mod A for movement of LE    Transfers Overall transfer level: Needs assistance Equipment used: Rolling walker (2 wheeled) Transfers: Sit to/from Stand Sit to Stand: Min assist         General transfer comment: Requires assist for hand placement on RW, 2/2 visual impairment    Balance Overall balance assessment: Needs assistance Sitting-balance support: Feet supported;No upper extremity supported Sitting balance-Leahy Scale: Good       Standing balance-Leahy Scale: Fair Standing balance comment: reliant on UE support                           ADL either performed or assessed with clinical judgement   ADL Overall ADL's : Needs assistance/impaired                     Lower Body Dressing: Minimal assistance                       Vision Baseline Vision/History: Legally blind;Glaucoma;Macular Degeneration Patient Visual Report: No change from baseline       Perception  Praxis      Pertinent Vitals/Pain Pain Assessment: No/denies pain     Hand Dominance     Extremity/Trunk Assessment Upper Extremity Assessment Upper Extremity Assessment: Generalized weakness   Lower Extremity Assessment Lower Extremity Assessment: Generalized weakness   Cervical / Trunk Assessment Cervical / Trunk Assessment: Normal   Communication Communication Communication: No difficulties   Cognition Arousal/Alertness: Awake/alert Behavior During Therapy: WFL for tasks assessed/performed Overall Cognitive Status: Within Functional Limits for tasks assessed                                  General Comments: family assisted with PLOF as needed, some confusion noted intermittently able to follow commands   General Comments       Exercises Other Exercises Other Exercises: Bed mobility, transfers, LB dressing. Educ re: falls prevention, home modifications for safety   Shoulder Instructions      Home Living Family/patient expects to be discharged to:: Private residence Living Arrangements: Alone Available Help at Discharge: Family;Available PRN/intermittently Type of Home: House Home Access: Stairs to enter [employer] of Steps: 2 Entrance Stairs-Rails: Right Home Layout: Two level;Bed/bath upstairs Alternate Level Stairs-Number of Steps: full flight Alternate Level Stairs-Rails: Can reach both;Right;Left Bathroom Shower/Tub: Occupational psychologist: Standard     Home Equipment: Environmental consultant - 4 wheels;Cane - single point;Shower seat          Prior Functioning/Environment Level of Independence: Needs assistance  Gait / Transfers Assistance Needed: pt uses RW within the home ADL's / Homemaking Assistance Needed: pt lives alone, family members visit each day and assist with shopping, cleaning, cooking            OT Problem List: Decreased strength;Decreased activity tolerance;Impaired balance (sitting and/or standing)      OT Treatment/Interventions: Self-care/ADL training;Therapeutic exercise;Patient/family education;Balance training;Energy conservation;Therapeutic activities    OT Goals(Current goals can be found in the care plan section) Acute Rehab OT Goals Patient Stated Goal: to get his strength back OT Goal Formulation: With patient Time For Goal Achievement: 06/13/20 Potential to Achieve Goals: Good ADL Goals Pt Will Perform Lower Body Dressing: with modified independence;sitting/lateral leans Pt Will Transfer to Toilet: with supervision;stand pivot transfer (using LRAD) Additional ADL Goal #1: Pt will identify  2+ falls prevention strategies  OT Frequency: Min 1X/week   Barriers to D/C:            Co-evaluation              AM-PAC OT "6 Clicks" Daily Activity     Outcome Measure Help from another person eating meals?: A Little Help from another person taking care of personal grooming?: A Little Help from another person toileting, which includes using toliet, bedpan, or urinal?: A Lot Help from another person bathing (including washing, rinsing, drying)?: A Lot Help from another person to put on and taking off regular upper body clothing?: A Little Help from another person to put on and taking off regular lower body clothing?: A Lot 6 Click Score: 15   End of Session Equipment Utilized During Treatment: Rolling walker  Activity Tolerance: Patient tolerated treatment well Patient left: in bed;with call bell/phone within reach;with bed alarm set;with family/visitor present  OT Visit Diagnosis: Unsteadiness on feet (R26.81);Muscle weakness (generalized) (M62.81)                Time: 0102-7253 OT Time Calculation (min): 31 min Charges:  OT General Charges $  OT Visit: 1 Visit OT Evaluation $OT Eval Low Complexity: 1 Low OT Treatments $Self Care/Home Management : 23-37 mins  Josiah Lobo, PhD, MS, OTR/L 05/30/20, 11:23 AM

## 2020-05-30 NOTE — Plan of Care (Signed)
  Problem: Education: Goal: Knowledge of General Education information will improve Description: Including pain rating scale, medication(s)/side effects and non-pharmacologic comfort measures 05/30/2020 1603 by Cristela Blue, RN Outcome: Progressing 05/30/2020 1603 by Cristela Blue, RN Outcome: Progressing   Problem: Health Behavior/Discharge Planning: Goal: Ability to manage health-related needs will improve 05/30/2020 1603 by Cristela Blue, RN Outcome: Progressing 05/30/2020 1603 by Cristela Blue, RN Outcome: Progressing   Problem: Clinical Measurements: Goal: Ability to maintain clinical measurements within normal limits will improve 05/30/2020 1603 by Cristela Blue, RN Outcome: Progressing 05/30/2020 1603 by Cristela Blue, RN Outcome: Progressing Goal: Will remain free from infection 05/30/2020 1603 by Cristela Blue, RN Outcome: Progressing 05/30/2020 1603 by Cristela Blue, RN Outcome: Progressing Goal: Diagnostic test results will improve 05/30/2020 1603 by Cristela Blue, RN Outcome: Progressing 05/30/2020 1603 by Cristela Blue, RN Outcome: Progressing Goal: Respiratory complications will improve 05/30/2020 1603 by Cristela Blue, RN Outcome: Progressing 05/30/2020 1603 by Cristela Blue, RN Outcome: Progressing Goal: Cardiovascular complication will be avoided 05/30/2020 1603 by Cristela Blue, RN Outcome: Progressing 05/30/2020 1603 by Cristela Blue, RN Outcome: Progressing   Problem: Activity: Goal: Risk for activity intolerance will decrease 05/30/2020 1603 by Cristela Blue, RN Outcome: Progressing 05/30/2020 1603 by Cristela Blue, RN Outcome: Progressing   Problem: Nutrition: Goal: Adequate nutrition will be maintained 05/30/2020 1603 by Cristela Blue, RN Outcome: Progressing 05/30/2020 1603 by Cristela Blue, RN Outcome: Progressing   Problem: Coping: Goal: Level of anxiety will decrease 05/30/2020 1603 by Cristela Blue, RN Outcome:  Progressing 05/30/2020 1603 by Cristela Blue, RN Outcome: Progressing   Problem: Elimination: Goal: Will not experience complications related to bowel motility 05/30/2020 1603 by Cristela Blue, RN Outcome: Progressing 05/30/2020 1603 by Cristela Blue, RN Outcome: Progressing Goal: Will not experience complications related to urinary retention 05/30/2020 1603 by Cristela Blue, RN Outcome: Progressing 05/30/2020 1603 by Cristela Blue, RN Outcome: Progressing   Problem: Pain Managment: Goal: General experience of comfort will improve 05/30/2020 1603 by Cristela Blue, RN Outcome: Progressing 05/30/2020 1603 by Cristela Blue, RN Outcome: Progressing   Problem: Safety: Goal: Ability to remain free from injury will improve 05/30/2020 1603 by Cristela Blue, RN Outcome: Progressing 05/30/2020 1603 by Cristela Blue, RN Outcome: Progressing   Problem: Skin Integrity: Goal: Risk for impaired skin integrity will decrease 05/30/2020 1603 by Cristela Blue, RN Outcome: Progressing 05/30/2020 1603 by Cristela Blue, RN Outcome: Progressing

## 2020-05-31 DIAGNOSIS — J9601 Acute respiratory failure with hypoxia: Secondary | ICD-10-CM | POA: Diagnosis not present

## 2020-05-31 DIAGNOSIS — R296 Repeated falls: Secondary | ICD-10-CM | POA: Diagnosis not present

## 2020-05-31 DIAGNOSIS — I10 Essential (primary) hypertension: Secondary | ICD-10-CM | POA: Diagnosis not present

## 2020-05-31 LAB — BASIC METABOLIC PANEL
Anion gap: 7 (ref 5–15)
BUN: 16 mg/dL (ref 8–23)
CO2: 22 mmol/L (ref 22–32)
Calcium: 8.9 mg/dL (ref 8.9–10.3)
Chloride: 113 mmol/L — ABNORMAL HIGH (ref 98–111)
Creatinine, Ser: 0.82 mg/dL (ref 0.61–1.24)
GFR, Estimated: >60 mL/min (ref >60)
Glucose, Bld: 106 mg/dL — ABNORMAL HIGH (ref 70–99)
Potassium: 3.7 mmol/L (ref 3.5–5.1)
Sodium: 142 mmol/L (ref 135–145)

## 2020-05-31 LAB — MAGNESIUM: Magnesium: 2.1 mg/dL (ref 1.7–2.4)

## 2020-05-31 MED ORDER — AMOXICILLIN-POT CLAVULANATE 875-125 MG PO TABS: 1.0000 | ORAL_TABLET | Freq: Two times a day (BID) | ORAL | 0 refills | Status: AC

## 2020-05-31 NOTE — Discharge Summary (Signed)
Physician Discharge Summary  Steve Bryant WJX:[id number] DOB: 07/01/1925 DOA: 05/27/2020  PCP: [name], MD  Admit date: 05/27/2020 Discharge date: 05/31/2020  Admitted From: home Disposition:  home  Recommendations for Outpatient Follow-up:  1. Follow up with PCP in 1-2 weeks 2. Please obtain BMP/CBC in one week 3. Please follow up with your neurologist as scheduled, call if having issues with and need to be seen sooner.  Home Health: PT, OT, RN  Equipment/Devices: none   Discharge Condition: Stable  CODE STATUS: DNR  Diet recommendation: Dysphagia 3 (chopped or softer foods), NO straws, add gravy to meats potatoes.   Discharge Diagnoses: Principal Problem:   Pneumonia Active Problems:   Essential hypertension, benign   CVA (cerebral vascular accident) ([place])   Gait instability   Frequent falls   Acute respiratory failure ([place])    Summary of HPI and Hospital Course:   Steve Vanderhoef Maxwellis a 87 y.o.malewith medical history significant forhypertension with complications of chronic kidney disease stage III, glaucoma, dyslipidemia, abdominal aortic aneurysm and history of CVA who presents to the ER for evaluation ofweakness withrecurrent mechanical falls at home over the past 2 days.    In the ED, patient was unable to ambulate safely with walker, very unsteady.  Later developed a fever.  Imaging consistent with pneumonia, and previously known right lung mass.   Pt dmitted for IV antibiotics and further evaluation.    Sepsis due to Community-acquired Pneumonia (vs ?aspiration) Sepsis POA with fever, leukocytosis, wet sounding cough and imaging c/w PNA.  Lactic acid normal. Chest x-ray shows an opacity in the right upper lung (has prior known lung mass), but given fevers, symptoms, white count, suspect superimposed infection. Treated with Unasyn and discharged with Augmentin to complete 5-day course Cleared by SLP with no over s/sx's of aspiration, diet  started Blood cultures -no growth to date, follow.  Acute respiratory failure secondary to pneumonia -  On admission, pt was tachypneic with rhonchi on exam. Room air pulse oximetry was 88% requiring oxygen.  Pt was able to be weaned off oxygen and maintaining O2 sats on room air.  Right lung mass -remote history of lung cancer status post resection.  No acute issues but may contribute to pneumonia. Patient and son report this was in the 47s.  Hypertension - chronic, stable.  Continue Cozaar  History of CVA  Continue aspirin, Plavix and statin  Frequent falls / gait instability / Ambulatory dysfunction - suspect due to acute illness.  Fall precautions. PT evaluation-home health PT recommended  Legally blind - likely contributes to falls.   OT eval -no follow-up recommended.  GERD Continue Protonix    Discharge Instructions   Discharge Instructions    Call MD for:  extreme fatigue   Complete by: As directed    Call MD for:  persistant dizziness or light-headedness   Complete by: As directed    Call MD for:  persistant nausea and vomiting   Complete by: As directed    Call MD for:  severe uncontrolled pain   Complete by: As directed    Call MD for:  temperature >100.4   Complete by: As directed    Diet - low sodium heart healthy   Complete by: As directed    Discharge instructions   Complete by: As directed    Take Augmentin (antibiotic) twice daily for two more days to complete treatment for pneumonia.  Please follow up with your primary care doctor in about a week to check  on how you're doing.  If confusion or hallucinations get worse, please call neurology or primary care doctor.   Increase activity slowly   Complete by: As directed      Allergies as of 05/31/2020      Reactions   Shellfish-derived Products Nausea Only   Scallops      Medication List    STOP taking these medications   Melatonin 10 MG Tabs   Myrbetriq 25 MG Tb24 tablet Generic  drug: mirabegron ER     TAKE these medications   amoxicillin-clavulanate 875-125 MG tablet Commonly known as: Augmentin Take 1 tablet by mouth 2 (two) times daily for 2 days.   aspirin EC 81 MG tablet Take 81 mg by mouth daily.   clopidogrel 75 MG tablet Commonly known as: PLAVIX Take 75 mg by mouth daily.   dorzolamide-timolol 22.3-6.8 MG/ML ophthalmic solution Commonly known as: COSOPT Place 1 drop into both eyes 2 (two) times daily.   gabapentin 100 MG capsule Commonly known as: NEURONTIN Take 100 mg by mouth 3 (three) times daily.   latanoprost 0.005 % ophthalmic solution Commonly known as: XALATAN USE 1 DROP IN THE RIGHT EYE AT BEDTIME AS DIRECTED   losartan 25 MG tablet Commonly known as: COZAAR Take 1 tablet (25 mg total) by mouth daily.   multivitamin tablet Take 1 tablet by mouth daily.   pantoprazole 20 MG tablet Commonly known as: PROTONIX Take 20 mg by mouth daily.   pregabalin 75 MG capsule Commonly known as: LYRICA Take 75 mg by mouth 3 (three) times daily.   QUEtiapine 25 MG tablet Commonly known as: SEROQUEL Take 25 mg by mouth 2 (two) times daily.   rosuvastatin 5 MG tablet Commonly known as: CRESTOR Take 5 mg by mouth daily.       Follow-up Information    [name], MD. Schedule an appointment as soon as possible for a visit in 3 days.   Specialty: Family Medicine Contact information: [address] [address] [phone]              Allergies  Allergen Reactions  . Shellfish-Derived Products Nausea Only    Scallops     If you experience worsening of your admission symptoms, develop shortness of breath, life threatening emergency, suicidal or homicidal thoughts you must seek medical attention immediately by calling 911 or calling your MD immediately  if symptoms less severe.    Please note   You were cared for by a hospitalist during your hospital stay. If you have any questions about your discharge  medications or the care you received while you were in the hospital after you are discharged, you can call the unit and asked to speak with the hospitalist on call if the hospitalist that took care of you is not available. Once you are discharged, your primary care physician will handle any further medical issues. Please note that NO REFILLS for any discharge medications will be authorized once you are discharged, as it is imperative that you return to your primary care physician (or establish a relationship with a primary care physician if you do not have one) for your aftercare needs so that they can reassess your need for medications and monitor your lab values.   Consultations:  None   Procedures/Studies: DG Forearm Left  Result Date: 05/27/2020 CLINICAL DATA:  87 year old male with fall and trauma to the left upper extremity. EXAM: LEFT FOREARM - 2 VIEW; LEFT HAND - COMPLETE 3+ VIEW COMPARISON:  None. FINDINGS: There is no acute fracture or dislocation. The bones are osteopenic. Degenerative changes of the base of the thumb. The soft tissues are unremarkable. IMPRESSION: No acute fracture or dislocation. Electronically Signed   By: Anner Crete M.D.   On: 05/27/2020 23:44   CT Head Wo Contrast  Result Date: 05/27/2020 CLINICAL DATA:  Fall with prolonged down time (6 hours) EXAM: CT HEAD WITHOUT CONTRAST CT CERVICAL SPINE WITHOUT CONTRAST TECHNIQUE: Multidetector CT imaging of the head and cervical spine was performed following the standard protocol without intravenous contrast. Multiplanar CT image reconstructions of the cervical spine were also generated. COMPARISON:  CT angiography of the head neck, MRI and CT head 04/15/2020 FINDINGS: CT HEAD FINDINGS Brain: Sequela of prior right parietal and bilateral cerebellar infarcts as well as remote appearing lacunar type infarcts in the bilateral basal ganglia, thalami and pons. No evidence of acute infarction, hemorrhage, hydrocephalus,  extra-axial collection, visible mass lesion or mass effect. Diffuse prominence of the ventricles, cisterns and sulci compatible with parenchymal volume loss. Patchy areas of white matter hypoattenuation are most compatible with chronic microvascular angiopathy. Vascular: Atherosclerotic calcification of the carotid siphons and intradural vertebral arteries. No hyperdense vessel. Skull: No acute scalp swelling, calvarial fracture or new osseous lesion. Expansile and hyperostotic changes of the frontal bone some of which is possibly related to chronic sinus disease as detailed below. Sinuses/Orbits: Extensive opacification throughout the paranasal sinuses, left greater than right with some or pneumatized secretions in the nasal passages as well. Hyperostotic changes likely reflect some chronicity. Orbital structures are unremarkable aside from prior lens extractions. Other: None. CT CERVICAL SPINE FINDINGS Alignment: Preservation of the normal cervical lordosis. Anterior C3-C7 cervical fusion with bony incorporation of spacers from the C4-C7 levels and complete disc height loss C3-4. No evidence of traumatic listhesis. No abnormally widened, perched or jumped facets. Normal alignment of the craniocervical and atlantoaxial articulations. Skull base and vertebrae: No acute skull base fracture. No vertebral body fracture or height loss. Normal bone mineralization. No worrisome osseous lesions. Fusion changes, as above. Spondylitic changes C2-3 and about the dense and craniocervical junctions. Some hypertrophic facet degenerative changes as well. Findings are better detailed below. Soft tissues and spinal canal: No pre or paravertebral fluid or swelling. No visible canal hematoma. Airways patent. Disc levels: Prior fusion C3-C7. Disc height loss and posterior osseous ridging and disc osteophyte complexes C2-3, C3-4 result in some partial effacement of the ventral thecal sac. Some additional osseous ridging and mild canal  narrowing noted diffusely C5-T1. Multilevel uncinate spurring facet hypertrophic changes result in mild-to-moderate multilevel neural foraminal narrowing which is most pronounced at the C5-6 level bilaterally. Upper chest: No acute abnormality in the upper chest or imaged lung apices. Redemonstration of a hypoattenuating 5.1 cm soft tissue mass at the level of the right lung apex/superior mediastinum, unchanged in appearance from prior. Biapical pleuroparenchymal scarring. Centrilobular emphysematous changes. Surgical clip seen at the interval between the right T1 and T2 transverse processes, unchanged from prior. Calcified pleural plaques. Proximal great vessel atherosclerotic calcifications as well as cervical carotid atherosclerosis. Other: Included portion of the thyroid gland are unremarkable. IMPRESSION: 1. No evidence of acute intracranial abnormality. No scalp swelling, calvarial fracture. 2. Sequela of prior right parietal and bilateral cerebellar infarcts as well as remote appearing lacunar type infarcts in the bilateral basal ganglia, thalami and pons. 3. Extensive paranasal sinus disease, left greater than right, with some or pneumatized secretions in the nasal passages as well. Hyperostotic changes of the  frontal bone and sinus perimeter. Appearance possibly reflecting combination of acute on chronic sinusitis, correlate with clinical symptoms. 4. No acute fracture or traumatic listhesis of the cervical spine. 5. Prior C3-C7 cervical fusion. 6. Multilevel degenerative changes of the cervical spine as described above. 7. Redemonstration of a 5.1 cm hypoattenuating soft tissue mass at the level of the right lung apex/superior mediastinum, unchanged in appearance from prior. Correlate with patient history and prior evaluation given that this was seen on comparison PET-CT 07/05/2009 demonstrating PET positivity at that time. 8. Emphysema (ICD10-J43.9). Electronically Signed   By: Lovena Le M.D.   On:  05/27/2020 23:26   CT Cervical Spine Wo Contrast  Result Date: 05/27/2020 CLINICAL DATA:  Fall with prolonged down time (6 hours) EXAM: CT HEAD WITHOUT CONTRAST CT CERVICAL SPINE WITHOUT CONTRAST TECHNIQUE: Multidetector CT imaging of the head and cervical spine was performed following the standard protocol without intravenous contrast. Multiplanar CT image reconstructions of the cervical spine were also generated. COMPARISON:  CT angiography of the head neck, MRI and CT head 04/15/2020 FINDINGS: CT HEAD FINDINGS Brain: Sequela of prior right parietal and bilateral cerebellar infarcts as well as remote appearing lacunar type infarcts in the bilateral basal ganglia, thalami and pons. No evidence of acute infarction, hemorrhage, hydrocephalus, extra-axial collection, visible mass lesion or mass effect. Diffuse prominence of the ventricles, cisterns and sulci compatible with parenchymal volume loss. Patchy areas of white matter hypoattenuation are most compatible with chronic microvascular angiopathy. Vascular: Atherosclerotic calcification of the carotid siphons and intradural vertebral arteries. No hyperdense vessel. Skull: No acute scalp swelling, calvarial fracture or new osseous lesion. Expansile and hyperostotic changes of the frontal bone some of which is possibly related to chronic sinus disease as detailed below. Sinuses/Orbits: Extensive opacification throughout the paranasal sinuses, left greater than right with some or pneumatized secretions in the nasal passages as well. Hyperostotic changes likely reflect some chronicity. Orbital structures are unremarkable aside from prior lens extractions. Other: None. CT CERVICAL SPINE FINDINGS Alignment: Preservation of the normal cervical lordosis. Anterior C3-C7 cervical fusion with bony incorporation of spacers from the C4-C7 levels and complete disc height loss C3-4. No evidence of traumatic listhesis. No abnormally widened, perched or jumped facets. Normal  alignment of the craniocervical and atlantoaxial articulations. Skull base and vertebrae: No acute skull base fracture. No vertebral body fracture or height loss. Normal bone mineralization. No worrisome osseous lesions. Fusion changes, as above. Spondylitic changes C2-3 and about the dense and craniocervical junctions. Some hypertrophic facet degenerative changes as well. Findings are better detailed below. Soft tissues and spinal canal: No pre or paravertebral fluid or swelling. No visible canal hematoma. Airways patent. Disc levels: Prior fusion C3-C7. Disc height loss and posterior osseous ridging and disc osteophyte complexes C2-3, C3-4 result in some partial effacement of the ventral thecal sac. Some additional osseous ridging and mild canal narrowing noted diffusely C5-T1. Multilevel uncinate spurring facet hypertrophic changes result in mild-to-moderate multilevel neural foraminal narrowing which is most pronounced at the C5-6 level bilaterally. Upper chest: No acute abnormality in the upper chest or imaged lung apices. Redemonstration of a hypoattenuating 5.1 cm soft tissue mass at the level of the right lung apex/superior mediastinum, unchanged in appearance from prior. Biapical pleuroparenchymal scarring. Centrilobular emphysematous changes. Surgical clip seen at the interval between the right T1 and T2 transverse processes, unchanged from prior. Calcified pleural plaques. Proximal great vessel atherosclerotic calcifications as well as cervical carotid atherosclerosis. Other: Included portion of the thyroid gland are unremarkable.  IMPRESSION: 1. No evidence of acute intracranial abnormality. No scalp swelling, calvarial fracture. 2. Sequela of prior right parietal and bilateral cerebellar infarcts as well as remote appearing lacunar type infarcts in the bilateral basal ganglia, thalami and pons. 3. Extensive paranasal sinus disease, left greater than right, with some or pneumatized secretions in the nasal  passages as well. Hyperostotic changes of the frontal bone and sinus perimeter. Appearance possibly reflecting combination of acute on chronic sinusitis, correlate with clinical symptoms. 4. No acute fracture or traumatic listhesis of the cervical spine. 5. Prior C3-C7 cervical fusion. 6. Multilevel degenerative changes of the cervical spine as described above. 7. Redemonstration of a 5.1 cm hypoattenuating soft tissue mass at the level of the right lung apex/superior mediastinum, unchanged in appearance from prior. Correlate with patient history and prior evaluation given that this was seen on comparison PET-CT 07/05/2009 demonstrating PET positivity at that time. 8. Emphysema (ICD10-J43.9). Electronically Signed   By: Lovena Le M.D.   On: 05/27/2020 23:26   DG Chest Portable 1 View  Result Date: 05/28/2020 CLINICAL DATA:  87 year old male with cough.  Frequent falls. EXAM: PORTABLE CHEST 1 VIEW COMPARISON:  Portable chest 02/17/2010 and earlier. FINDINGS: Portable AP upright view at 0940 hours. Similar lung volumes and mediastinal contours compared to 2011. Calcified aortic atherosclerosis. Tracheal air column grossly normal. No pneumothorax, pulmonary edema, pleural effusion or consolidation, but there is streaky new asymmetric pulmonary opacity greater on the right. Chronic right apical lung staple line is stable. Surgical clips in the right lower neck are stable. Negative visible bowel gas. No acute osseous abnormality identified. IMPRESSION: 1. New streaky asymmetric pulmonary opacity suspicious for acute viral/atypical respiratory infection, most apparent in the right upper lung. 2. Underlying chronic postoperative changes to the right upper lung. No other acute cardiopulmonary abnormality. Aortic Atherosclerosis (ICD10-I70.0). Electronically Signed   By: Genevie Ann M.D.   On: 05/28/2020 10:07   DG Hand Complete Left  Result Date: 05/27/2020 CLINICAL DATA:  87 year old male with fall and trauma to the  left upper extremity. EXAM: LEFT FOREARM - 2 VIEW; LEFT HAND - COMPLETE 3+ VIEW COMPARISON:  None. FINDINGS: There is no acute fracture or dislocation. The bones are osteopenic. Degenerative changes of the base of the thumb. The soft tissues are unremarkable. IMPRESSION: No acute fracture or dislocation. Electronically Signed   By: Anner Crete M.D.   On: 05/27/2020 23:44     Subjective: Pt reports feeling well.  Cough improving.  No fever/chills.  Feels ready to go home.  No acute events reported.   Discharge Exam: Vitals:   05/31/20 0451 05/31/20 0909  BP: (!) 189/88 (!) 188/94  Pulse: 86 91  Resp: 20   Temp: 98.6 F (37 C) 97.6 F (36.4 C)  SpO2: 94% 95%   Vitals:   05/30/20 2137 05/30/20 2348 05/31/20 0451 05/31/20 0909  BP: (!) 159/79 (!) 171/86 (!) 189/88 (!) 188/94  Pulse: 79 80 86 91  Resp:  18 20   Temp:  97.7 F (36.5 C) 98.6 F (37 C) 97.6 F (36.4 C)  TempSrc:  Tympanic    SpO2:  94% 94% 95%  Weight:      Height:        General: Pt is alert, awake, not in acute distress Cardiovascular: RRR, S1/S2 +, no rubs, no gallops Respiratory: CTA bilaterally diminished on right, no wheezing, no rhonchi Abdominal: Soft, NT, ND, bowel sounds + Extremities: no edema, no cyanosis    The results of  significant diagnostics from this hospitalization (including imaging, microbiology, ancillary and laboratory) are listed below for reference.     Microbiology: Recent Results (from the past 240 hour(s))  Resp Panel by RT-PCR (Flu A&B, Covid) Nasopharyngeal Swab     Status: None   Collection Time: 05/27/20 10:40 PM   Specimen: Nasopharyngeal Swab; Nasopharyngeal(NP) swabs in vial transport medium  Result Value Ref Range Status   SARS Coronavirus 2 by RT PCR NEGATIVE NEGATIVE Final    Comment: (NOTE) SARS-CoV-2 target nucleic acids are NOT DETECTED.  The SARS-CoV-2 RNA is generally detectable in upper respiratory specimens during the acute phase of infection. The  lowest concentration of SARS-CoV-2 viral copies this assay can detect is 138 copies/mL. A negative result does not preclude SARS-Cov-2 infection and should not be used as the sole basis for treatment or other patient management decisions. A negative result may occur with  improper specimen collection/handling, submission of specimen other than nasopharyngeal swab, presence of viral mutation(s) within the areas targeted by this assay, and inadequate number of viral copies(<138 copies/mL). A negative result must be combined with clinical observations, patient history, and epidemiological information. The expected result is Negative.  Fact Sheet for Patients:  EntrepreneurPulse.com.au  Fact Sheet for Healthcare Providers:  IncredibleEmployment.be  This test is no t yet approved or cleared by the [place] FDA and  has been authorized for detection and/or diagnosis of SARS-CoV-2 by FDA under an Emergency Use Authorization (EUA). This EUA will remain  in effect (meaning this test can be used) for the duration of the COVID-19 declaration under Section 564(b)(1) of the Act, 21 U.S.C.section 360bbb-3(b)(1), unless the authorization is terminated  or revoked sooner.       Influenza A by PCR NEGATIVE NEGATIVE Final   Influenza B by PCR NEGATIVE NEGATIVE Final    Comment: (NOTE) The Xpert Xpress SARS-CoV-2/FLU/RSV plus assay is intended as an aid in the diagnosis of influenza from Nasopharyngeal swab specimens and should not be used as a sole basis for treatment. Nasal washings and aspirates are unacceptable for Xpert Xpress SARS-CoV-2/FLU/RSV testing.  Fact Sheet for Patients: EntrepreneurPulse.com.au  Fact Sheet for Healthcare Providers: IncredibleEmployment.be  This test is not yet approved or cleared by the [place] FDA and has been authorized for detection and/or diagnosis of SARS-CoV-2 by FDA under  an Emergency Use Authorization (EUA). This EUA will remain in effect (meaning this test can be used) for the duration of the COVID-19 declaration under Section 564(b)(1) of the Act, 21 U.S.C. section 360bbb-3(b)(1), unless the authorization is terminated or revoked.  Performed at [facility], [address] [place], [place] [address]   Urine culture     Status: Abnormal   Collection Time: 05/27/20 11:52 PM   Specimen: In/Out Cath Urine  Result Value Ref Range Status   Specimen Description   Final    IN/OUT CATH URINE Performed at [facility], [address], [place], [place] [address]    Special Requests   Final    NONE Performed at [facility], [address] [place], [place] [address]    Culture (A)  Final    <10,000 COLONIES/mL INSIGNIFICANT GROWTH Performed at [facility], [place] [address], [place], [place] [address]    Report Status 05/29/2020 FINAL  Final  Blood Culture (routine x 2)     Status: None (Preliminary result)   Collection Time: 05/28/20 10:40 AM   Specimen: BLOOD  Result Value Ref Range Status   Specimen  Description BLOOD RIGHT AC  Final   Special Requests   Final    BOTTLES DRAWN AEROBIC AND ANAEROBIC Blood Culture results may not be optimal due to an excessive volume of blood received in culture bottles   Culture   Final    NO GROWTH 3 DAYS Performed at [facility], [address] [place], [place] [address]    Report Status PENDING  Incomplete  Blood Culture (routine x 2)     Status: None (Preliminary result)   Collection Time: 05/28/20 10:49 AM   Specimen: BLOOD  Result Value Ref Range Status   Specimen Description BLOOD LEFT [facility]  Final   Special Requests   Final    BOTTLES DRAWN AEROBIC AND ANAEROBIC Blood Culture adequate volume   Culture   Final    NO GROWTH 3 DAYS Performed at [facility], [address] [place], [place] [address]    Report Status PENDING  Incomplete      Labs: BNP (last 3 results) No results for input(s): BNP in the last 8760 hours. Basic Metabolic Panel: Recent Labs  Lab 05/27/20 2240 05/29/20 0621 05/30/20 0636 05/31/20 0547  NA 139 142 141 142  K 4.0 3.4* 3.1* 3.7  CL 106 112* 112* 113*  CO2 24 20* 21* 22  GLUCOSE 113* 83 102* 106*  BUN 19 23 24* 16  CREATININE 1.04 1.09 1.02 0.82  CALCIUM 9.4 8.3* 8.6* 8.9  MG  --   --  2.0 2.1   Liver Function Tests: Recent Labs  Lab 05/27/20 2240  AST 27  ALT 16  ALKPHOS 69  BILITOT 1.5*  PROT 7.2  ALBUMIN 4.1   No results for input(s): LIPASE, AMYLASE in the last 168 hours. No results for input(s): AMMONIA in the last 168 hours. CBC: Recent Labs  Lab 05/27/20 2240 05/29/20 0621 05/30/20 0636  WBC 13.9* 11.2* 9.0  NEUTROABS 10.5*  --  5.9  HGB 15.4 13.4 13.2  HCT 48.0 40.9 40.4  MCV 88.2 89.1 87.1  PLT 170 140* 166   Cardiac Enzymes: Recent Labs  Lab 05/27/20 2240  CKTOTAL 174   BNP: Invalid input(s): POCBNP CBG: Recent Labs  Lab 05/30/20 1144  GLUCAP 106*   D-Dimer No results for input(s): DDIMER in the last 72 hours. Hgb A1c No results for input(s): HGBA1C in the last 72 hours. Lipid Profile No results for input(s): CHOL, HDL, LDLCALC, TRIG, CHOLHDL, LDLDIRECT in the last 72 hours. Thyroid function studies No results for input(s): TSH, T4TOTAL, T3FREE, THYROIDAB in the last 72 hours.  Invalid input(s): FREET3 Anemia work up No results for input(s): VITAMINB12, FOLATE, FERRITIN, TIBC, IRON, RETICCTPCT in the last 72 hours. Urinalysis    Component Value Date/Time   COLORURINE YELLOW (A) 05/27/2020 2238   APPEARANCEUR CLEAR (A) 05/27/2020 2238   LABSPEC 1.019 05/27/2020 2238   PHURINE 5.0 05/27/2020 2238   GLUCOSEU NEGATIVE 05/27/2020 2238   HGBUR NEGATIVE 05/27/2020 2238   BILIRUBINUR NEGATIVE 05/27/2020 2238   KETONESUR 5 (A) 05/27/2020 2238   PROTEINUR NEGATIVE 05/27/2020 2238   NITRITE NEGATIVE 05/27/2020 2238   LEUKOCYTESUR NEGATIVE  05/27/2020 2238   Sepsis Labs Invalid input(s): PROCALCITONIN,  WBC,  LACTICIDVEN Microbiology Recent Results (from the past 240 hour(s))  Resp Panel by RT-PCR (Flu A&B, Covid) Nasopharyngeal Swab     Status: None   Collection Time: 05/27/20 10:40 PM   Specimen: Nasopharyngeal Swab; Nasopharyngeal(NP) swabs in vial transport medium  Result Value Ref Range Status   SARS  Coronavirus 2 by RT PCR NEGATIVE NEGATIVE Final    Comment: (NOTE) SARS-CoV-2 target nucleic acids are NOT DETECTED.  The SARS-CoV-2 RNA is generally detectable in upper respiratory specimens during the acute phase of infection. The lowest concentration of SARS-CoV-2 viral copies this assay can detect is 138 copies/mL. A negative result does not preclude SARS-Cov-2 infection and should not be used as the sole basis for treatment or other patient management decisions. A negative result may occur with  improper specimen collection/handling, submission of specimen other than nasopharyngeal swab, presence of viral mutation(s) within the areas targeted by this assay, and inadequate number of viral copies(<138 copies/mL). A negative result must be combined with clinical observations, patient history, and epidemiological information. The expected result is Negative.  Fact Sheet for Patients:  EntrepreneurPulse.com.au  Fact Sheet for Healthcare Providers:  IncredibleEmployment.be  This test is no t yet approved or cleared by the [place] FDA and  has been authorized for detection and/or diagnosis of SARS-CoV-2 by FDA under an Emergency Use Authorization (EUA). This EUA will remain  in effect (meaning this test can be used) for the duration of the COVID-19 declaration under Section 564(b)(1) of the Act, 21 U.S.C.section 360bbb-3(b)(1), unless the authorization is terminated  or revoked sooner.       Influenza A by PCR NEGATIVE NEGATIVE Final   Influenza B by PCR NEGATIVE  NEGATIVE Final    Comment: (NOTE) The Xpert Xpress SARS-CoV-2/FLU/RSV plus assay is intended as an aid in the diagnosis of influenza from Nasopharyngeal swab specimens and should not be used as a sole basis for treatment. Nasal washings and aspirates are unacceptable for Xpert Xpress SARS-CoV-2/FLU/RSV testing.  Fact Sheet for Patients: EntrepreneurPulse.com.au  Fact Sheet for Healthcare Providers: IncredibleEmployment.be  This test is not yet approved or cleared by the [place] FDA and has been authorized for detection and/or diagnosis of SARS-CoV-2 by FDA under an Emergency Use Authorization (EUA). This EUA will remain in effect (meaning this test can be used) for the duration of the COVID-19 declaration under Section 564(b)(1) of the Act, 21 U.S.C. section 360bbb-3(b)(1), unless the authorization is terminated or revoked.  Performed at [facility], [address], [place], [place] [address]   Urine culture     Status: Abnormal   Collection Time: 05/27/20 11:52 PM   Specimen: In/Out Cath Urine  Result Value Ref Range Status   Specimen Description   Final    IN/OUT CATH URINE Performed at [facility], [address] [place], [place] [address]    Special Requests   Final    NONE Performed at [facility], [address] [place], [place] [address]    Culture (A)  Final    <10,000 COLONIES/mL INSIGNIFICANT GROWTH Performed at [facility], [place] [address] [place], [place] [address]    Report Status 05/29/2020 FINAL  Final  Blood Culture (routine x 2)     Status: None (Preliminary result)   Collection Time: 05/28/20 10:40 AM   Specimen: BLOOD  Result Value Ref Range Status   Specimen Description BLOOD RIGHT AC  Final   Special Requests   Final    BOTTLES DRAWN AEROBIC AND ANAEROBIC Blood Culture results may not be optimal due to an excessive volume of blood received in culture bottles    Culture   Final    NO GROWTH 3 DAYS Performed at [facility], [address] [place], [place] [address]    Report Status PENDING  Incomplete  Blood Culture (routine x 2)     Status: None (Preliminary result)   Collection Time: 05/28/20 10:49 AM   Specimen: BLOOD  Result Value Ref Range Status   Specimen Description BLOOD LEFT [facility]  Final   Special Requests   Final    BOTTLES DRAWN AEROBIC AND ANAEROBIC Blood Culture adequate volume   Culture   Final    NO GROWTH 3 DAYS Performed at [facility], [address], [place], [place] [address]    Report Status PENDING  Incomplete     Time coordinating discharge: Over 30 minutes  SIGNED:   Ezekiel Slocumb, DO [facility] 05/31/2020, 10:23 AM   If 7PM-7AM, please contact night-coverage [email or web address]

## 2020-05-31 NOTE — Plan of Care (Signed)
  Problem: Education: Goal: Knowledge of General Education information will improve Description: Including pain rating scale, medication(s)/side effects and non-pharmacologic comfort measures 05/31/2020 1303 by Cristela Blue, RN Outcome: Adequate for Discharge 05/31/2020 1303 by Cristela Blue, RN Outcome: Progressing   Problem: Health Behavior/Discharge Planning: Goal: Ability to manage health-related needs will improve 05/31/2020 1303 by Cristela Blue, RN Outcome: Adequate for Discharge 05/31/2020 1303 by Cristela Blue, RN Outcome: Progressing   Problem: Clinical Measurements: Goal: Ability to maintain clinical measurements within normal limits will improve 05/31/2020 1303 by Cristela Blue, RN Outcome: Adequate for Discharge 05/31/2020 1303 by Cristela Blue, RN Outcome: Progressing Goal: Will remain free from infection 05/31/2020 1303 by Cristela Blue, RN Outcome: Adequate for Discharge 05/31/2020 1303 by Cristela Blue, RN Outcome: Progressing Goal: Diagnostic test results will improve 05/31/2020 1303 by Cristela Blue, RN Outcome: Adequate for Discharge 05/31/2020 1303 by Cristela Blue, RN Outcome: Progressing Goal: Respiratory complications will improve 05/31/2020 1303 by Cristela Blue, RN Outcome: Adequate for Discharge 05/31/2020 1303 by Cristela Blue, RN Outcome: Progressing Goal: Cardiovascular complication will be avoided 05/31/2020 1303 by Cristela Blue, RN Outcome: Adequate for Discharge 05/31/2020 1303 by Cristela Blue, RN Outcome: Progressing   Problem: Activity: Goal: Risk for activity intolerance will decrease 05/31/2020 1303 by Cristela Blue, RN Outcome: Adequate for Discharge 05/31/2020 1303 by Cristela Blue, RN Outcome: Progressing   Problem: Nutrition: Goal: Adequate nutrition will be maintained 05/31/2020 1303 by Cristela Blue, RN Outcome: Adequate for Discharge 05/31/2020 1303 by Cristela Blue, RN Outcome: Progressing   Problem:  Coping: Goal: Level of anxiety will decrease 05/31/2020 1303 by Cristela Blue, RN Outcome: Adequate for Discharge 05/31/2020 1303 by Cristela Blue, RN Outcome: Progressing   Problem: Elimination: Goal: Will not experience complications related to bowel motility 05/31/2020 1303 by Cristela Blue, RN Outcome: Adequate for Discharge 05/31/2020 1303 by Cristela Blue, RN Outcome: Progressing Goal: Will not experience complications related to urinary retention 05/31/2020 1303 by Cristela Blue, RN Outcome: Adequate for Discharge 05/31/2020 1303 by Cristela Blue, RN Outcome: Progressing   Problem: Pain Managment: Goal: General experience of comfort will improve 05/31/2020 1303 by Cristela Blue, RN Outcome: Adequate for Discharge 05/31/2020 1303 by Cristela Blue, RN Outcome: Progressing   Problem: Safety: Goal: Ability to remain free from injury will improve 05/31/2020 1303 by Cristela Blue, RN Outcome: Adequate for Discharge 05/31/2020 1303 by Cristela Blue, RN Outcome: Progressing   Problem: Skin Integrity: Goal: Risk for impaired skin integrity will decrease 05/31/2020 1303 by Cristela Blue, RN Outcome: Adequate for Discharge 05/31/2020 1303 by Cristela Blue, RN Outcome: Progressing

## 2020-05-31 NOTE — TOC Transition Note (Signed)
Transition of Care [facility]) - CM/SW Discharge Note   Patient Details  Name: [name] MRN: [id number] Date of Birth: 1925-09-17  [facility]) CM/SW Contact:  Eileen Stanford, LCSW Phone Number: 05/31/2020, 12:38 PM   Clinical Narrative:  Pt [place] arranged through [facility]. NO additional needs.     Final next level of care: [place] Barriers to Discharge: No Barriers Identified   Patient Goals and CMS Choice Patient states their goals for this hospitalization and ongoing recovery are:: to get better   Choice offered to / list presented to : Adult Children  Discharge Placement                    Patient and family notified of of transfer: 05/31/20  Discharge Plan and Services In-house Referral: Clinical Social Work   Post Acute Care Choice: Home Health                    HH Arranged: PT [facility] Agency: [place] ([facility]) Date [place]: 05/30/20 Time [place]: 1035 Representative spoke with at [place]: [place] ([place]) Interventions     Readmission Risk Interventions No flowsheet data found.

## 2020-05-31 NOTE — Care Management Important Message (Signed)
Important Message  Patient Details  Name: LORIE MELICHAR MRN: [id number] Date of Birth: 01/22/1926   Medicare Important Message Given:  Other (see comment)  Attempted to reach patient via room phone due to isolation status to review Medicare IM, however unable to reach.    Dannette Barbara 05/31/2020, 4:19 PM

## 2020-05-31 NOTE — Progress Notes (Signed)
This RN provided discharge instructions and teaching to the patient and the patient's family. The patient experiences acute confusion, however the patient's family verbalized and demonstrated understanding of the provided instructions. R arm PIV removed. Cannula intact. Pt tolerated well. All belongings packed and in tow. Volunteer to transport patient to private vehicle via wheelchair for discharge.

## 2020-06-02 LAB — CULTURE, BLOOD (ROUTINE X 2)
Culture: NO GROWTH
Culture: NO GROWTH
Special Requests: ADEQUATE

## 2020-09-20 ENCOUNTER — Other Ambulatory Visit (INDEPENDENT_AMBULATORY_CARE_PROVIDER_SITE_OTHER): Payer: Self-pay | Admitting: Nurse Practitioner

## 2020-09-20 ENCOUNTER — Telehealth (INDEPENDENT_AMBULATORY_CARE_PROVIDER_SITE_OTHER): Payer: Self-pay | Admitting: Vascular Surgery

## 2020-09-20 DIAGNOSIS — I713 Abdominal aortic aneurysm, ruptured, unspecified: Secondary | ICD-10-CM

## 2020-09-20 NOTE — Telephone Encounter (Signed)
Spoke with the patient's daughter and she was given the information regarding the CT and calling radiology if they have not called in 2-3 days. She was also asked to call our office back  once the appt has been made to schedule a follow up to discuss results. Patient's dye allergy protocol was called into his pharmacy at [place]. [place].

## 2020-09-20 NOTE — Telephone Encounter (Signed)
He will need a CT prior to his appointment, I have added in the orders.  The patient has a shellfish allergy and will like need to be premedicated prior to his CT, if we can call the dye allergy protocol in

## 2020-09-20 NOTE — Telephone Encounter (Signed)
Called stating that it has been over a year since patient was last seen (06/2019). JD's last note mentioned a CTA and to follow up in 75mos. I do not see any records of patient having CTA nor was patient scheduled for f/u. Do we need another order put in for this patient to have CTA done? Please advise.

## 2020-09-23 NOTE — Telephone Encounter (Signed)
Pharmacy left a message regarding the Pepcid for the dye allergy protocol, wanting to know refill and strength. They were called back and told 40 mg and no refills for the dye allergy protocol.

## 2020-09-27 ENCOUNTER — Telehealth (INDEPENDENT_AMBULATORY_CARE_PROVIDER_SITE_OTHER): Payer: Self-pay | Admitting: Nurse Practitioner

## 2020-09-27 ENCOUNTER — Other Ambulatory Visit (INDEPENDENT_AMBULATORY_CARE_PROVIDER_SITE_OTHER): Payer: Self-pay | Admitting: Nurse Practitioner

## 2020-09-27 DIAGNOSIS — I713 Abdominal aortic aneurysm, ruptured, unspecified: Secondary | ICD-10-CM

## 2020-09-27 NOTE — Telephone Encounter (Signed)
Patient called in stating he hasn't heard from the hospital about setting up his CT.  He wanted to go ahead and get scheduled.  Please advise.

## 2020-09-27 NOTE — Telephone Encounter (Signed)
I called the pt and gave him  an his daughter the number to radiology scheduling to call and schedule his CT when best fits their needs.

## 2020-09-30 NOTE — Telephone Encounter (Signed)
Patient daughter was made aware that CT is ready to be schedule and to contact the office to schedule follow up with Dr Lucky Cowboy

## 2020-09-30 NOTE — Telephone Encounter (Signed)
Patients daughter called in stating, patient called the number given for CT (radiology), but radiology is telling patient the order isn't int he system to schedule.    I do see where [facility] has put in a order for CT for patient, I'm not sure what radiology is needing to get patient scheduled.  The daughter of the patient would like to be called back instead of the patient.  Please advise.

## 2020-10-15 ENCOUNTER — Ambulatory Visit: Payer: Medicare Other

## 2020-10-21 ENCOUNTER — Other Ambulatory Visit: Payer: Self-pay

## 2020-10-21 ENCOUNTER — Ambulatory Visit
Admission: RE | Admit: 2020-10-21 | Discharge: 2020-10-21 | Disposition: A | Discharge status: Home / Self Care | Payer: Medicare Other | Source: Ambulatory Visit | Attending: Nurse Practitioner | Admitting: Nurse Practitioner

## 2020-10-21 DIAGNOSIS — I713 Abdominal aortic aneurysm, ruptured, unspecified: Secondary | ICD-10-CM

## 2020-10-21 LAB — POCT I-STAT CREATININE: Creatinine, Ser: 1 mg/dL (ref 0.61–1.24)

## 2020-10-21 MED ORDER — IOHEXOL 350 MG/ML SOLN
75.0000 mL | Freq: Once | INTRAVENOUS | Status: AC | PRN
  Administered 2020-10-21: 75 mL via INTRAVENOUS

## 2020-10-21 NOTE — Progress Notes (Signed)
Pt needs to be scheduled for CT follow up with Dr. Lucky Cowboy please

## 2020-10-29 ENCOUNTER — Other Ambulatory Visit: Payer: Self-pay

## 2020-10-29 ENCOUNTER — Ambulatory Visit (INDEPENDENT_AMBULATORY_CARE_PROVIDER_SITE_OTHER): Payer: Medicare Other | Admitting: Vascular Surgery

## 2020-10-29 VITALS — BP 163/84 | HR 61 | Ht 69.0 in | Wt 187.0 lb

## 2020-10-29 DIAGNOSIS — I714 Abdominal aortic aneurysm, without rupture, unspecified: Secondary | ICD-10-CM

## 2020-10-29 NOTE — Assessment & Plan Note (Signed)
I have independently reviewed his CT angiogram of the abdomen and pelvis.  His aneurysm has had continued growth and now measures about 5.4 to 5.5 cm in maximal diameter.  He has a long infrarenal neck with the aneurysm largely in the distal aorta.  He reports no clear symptoms of the aneurysm, but with persistent growth and now measuring roughly 5-1/2 cm, particularly given his favorable anatomy for endovascular repair with a long aortic neck that may even allow doing this without general anesthesia, I think repair is now a reasonable option.  Risks and benefits were discussed with he and his family today and he is agreeable to proceed.

## 2020-10-29 NOTE — Progress Notes (Signed)
MRN : [id number]  Steve Bryant is a 87 y.o. (08-26-1925) male who presents with chief complaint of  Chief Complaint  Patient presents with   Follow-up    CT results  .  History of Present Illness: Patient returns today in follow up of his abdominal aortic aneurysm.  He is doing well today.  No clear symptoms of his aneurysm. Specifically, the patient denies new back or abdominal pain, or signs of peripheral embolization I have independently reviewed his CT angiogram of the abdomen and pelvis.  His aneurysm has had continued growth and now measures about 5.4 to 5.5 cm in maximal diameter.  He has a long infrarenal neck with the aneurysm largely in the distal aorta.  Current Outpatient Medications  Medication Sig Dispense Refill   aspirin EC 81 MG tablet Take 81 mg by mouth daily.     clopidogrel (PLAVIX) 75 MG tablet Take 75 mg by mouth daily.     dorzolamide (TRUSOPT) 2 % ophthalmic solution Place 1 drop into the right eye daily.     dorzolamide-timolol (COSOPT) 22.3-6.8 MG/ML ophthalmic solution Place 1 drop into both eyes 2 (two) times daily.  2   gabapentin (NEURONTIN) 100 MG capsule Take 100 mg by mouth 3 (three) times daily.     latanoprost (XALATAN) 0.005 % ophthalmic solution USE 1 DROP IN THE RIGHT EYE AT BEDTIME AS DIRECTED  2   latanoprost (XALATAN) 0.005 % ophthalmic solution Apply to eye.     losartan (COZAAR) 25 MG tablet Take 1 tablet (25 mg total) by mouth daily. 30 tablet 2   Multiple Vitamin (MULTIVITAMIN) tablet Take 1 tablet by mouth daily.     pantoprazole (PROTONIX) 20 MG tablet Take 20 mg by mouth daily.     pantoprazole (PROTONIX) 20 MG tablet Take 1 tablet by mouth daily.     pregabalin (LYRICA) 75 MG capsule Take 75 mg by mouth 3 (three) times daily.     QUEtiapine (SEROQUEL) 25 MG tablet Take 25 mg by mouth 2 (two) times daily.     rosuvastatin (CRESTOR) 5 MG tablet Take 5 mg by mouth daily.     No current facility-administered medications for this  visit.    Past Medical History:  Diagnosis Date   AAA (abdominal aortic aneurysm) (HCC)    Arthritis    Glaucoma    Hyperlipidemia    Hypertension    Squamous cell carcinoma of skin 06/15/2019   left distal medial pretibial. WD SCC. EDC   Squamous cell carcinoma of skin 06/15/2019   Right calf. KA-type. EDC   Stroke [facility])     Past Surgical History:  Procedure Laterality Date   EYE SURGERY     neck fusion     TUMOR REMOVAL       Social History   Tobacco Use   Smoking status: Former   Smokeless tobacco: Never  Vaping Use   Vaping Use: Never used  Substance Use Topics   Alcohol use: Not Currently    Comment: occasionally   Drug use: No      Family History  Problem Relation Age of Onset   Stroke Mother    Stroke Father    Cancer Brother      Allergies  Allergen Reactions   Shellfish-Derived Products Nausea Only    Scallops     REVIEW OF SYSTEMS (Negative unless checked)  Constitutional: '[]'$ Weight loss  '[]'$ Fever  '[]'$ Chills Cardiac: '[]'$ Chest pain   '[]'$ Chest pressure   '[x]'$ Palpitations   '[]'$   Shortness of breath when laying flat   '[]'$ Shortness of breath at rest   '[x]'$ Shortness of breath with exertion. Vascular:  '[]'$ Pain in legs with walking   '[]'$ Pain in legs at rest   '[]'$ Pain in legs when laying flat   '[]'$ Claudication   '[]'$ Pain in feet when walking  '[]'$ Pain in feet at rest  '[]'$ Pain in feet when laying flat   '[]'$ History of DVT   '[]'$ Phlebitis   '[]'$ Swelling in legs   '[]'$ Varicose veins   '[]'$ Non-healing ulcers Pulmonary:   '[]'$ Uses home oxygen   '[]'$ Productive cough   '[]'$ Hemoptysis   '[]'$ Wheeze  '[]'$ COPD   '[]'$ Asthma Neurologic:  '[]'$ Dizziness  '[]'$ Blackouts   '[]'$ Seizures   '[x]'$ History of stroke   '[]'$ History of TIA  '[]'$ Aphasia   '[]'$ Temporary blindness   '[]'$ Dysphagia   '[]'$ Weakness or numbness in arms   '[]'$ Weakness or numbness in legs Musculoskeletal:  '[x]'$ Arthritis   '[]'$ Joint swelling   '[]'$ Joint pain   '[x]'$ Low back pain Hematologic:  '[]'$ Easy bruising  '[]'$ Easy bleeding   '[]'$ Hypercoagulable state   '[]'$ Anemic    Gastrointestinal:  '[]'$ Blood in stool   '[]'$ Vomiting blood  '[]'$ Gastroesophageal reflux/heartburn   '[]'$ Abdominal pain Genitourinary:  '[]'$ Chronic kidney disease   '[]'$ Difficult urination  '[]'$ Frequent urination  '[]'$ Burning with urination   '[]'$ Hematuria Skin:  '[]'$ Rashes   '[]'$ Ulcers   '[]'$ Wounds Psychological:  '[]'$ History of anxiety   '[]'$  History of major depression.  Physical Examination  BP (!) 163/84   Pulse 61   Ht '5\' 9"'$  (1.753 m)   Wt 187 lb (84.8 kg)   BMI 27.62 kg/m  Gen:  WD/WN, NAD Head: [place]/AT, No temporalis wasting. Ear/Nose/Throat: Hearing grossly intact, nares w/o erythema or drainage Eyes: Conjunctiva clear. Sclera non-icteric Neck: Supple.  Trachea midline Pulmonary:  Good air movement, no use of accessory muscles.  Cardiac: Irregular Vascular:  Vessel Right Left  Radial Palpable Palpable                          PT Palpable Palpable  DP Palpable Palpable   Gastrointestinal: soft, non-tender/non-distended. No guarding/reflex.  Increased aortic impulse Musculoskeletal: M/S 5/5 throughout.  No deformity or atrophy.  Walks with a walker.  Trace lower extremity edema. Neurologic: Sensation grossly intact in extremities.  Symmetrical.  Speech is fluent.  Psychiatric: Judgment intact, Mood & affect appropriate for pt's clinical situation. Dermatologic: No rashes or ulcers noted.  No cellulitis or open wounds.      Labs Recent Results (from the past 2160 hour(s))  I-STAT creatinine     Status: None   Collection Time: 10/21/20 10:57 AM  Result Value Ref Range   Creatinine, Ser 1.00 0.61 - 1.24 mg/dL    Radiology CT Angio Abd/Pel w/ and/or w/o  Result Date: 10/21/2020 CLINICAL DATA:  AAA, preoperative planning EXAM: CTA ABDOMEN AND PELVIS WITHOUT AND WITH CONTRAST TECHNIQUE: Multidetector CT imaging of the abdomen and pelvis was performed using the standard protocol during bolus administration of intravenous contrast. Multiplanar reconstructed images and MIPs were obtained and  reviewed to evaluate the vascular anatomy. CONTRAST:  54m OMNIPAQUE IOHEXOL 350 MG/ML SOLN COMPARISON:  None. FINDINGS: VASCULAR Aorta: There is diffuse atherosclerotic fibrofatty and calcified plaque involving the abdominal aorta and its branch vessels. The supra celiac abdominal aorta measures up to 4 cm in the AP axis. The infrarenal abdominal aorta just above the level of the aneurysm measures up to 2 cm in the AP axis. There is an infrarenal abdominal aortic aneurysm that measures up to 5.4  x 5.0 cm in the axial plane, and extends to the iliac bifurcation. Celiac: Patent without evidence of aneurysm, dissection, vasculitis or significant stenosis. SMA: Patent.  Ostial calcification results in mild stenosis. IMA: Patent. Renals: Single renal arteries bilaterally. Atherosclerotic disease results and no significant stenosis on the right, and moderate ostial stenosis on the left. Inflow: Patent. Calcific atherosclerotic disease results and scattered mild stenoses. Veins: No obvious venous abnormality within the limitations of this arterial phase study. NON-VASCULAR Inferior chest: The lung bases are well-aerated. Hepatobiliary: The liver is normal in size without focal abnormality. No intrahepatic or extrahepatic biliary ductal dilation. 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 Spleen: Normal in size without focal abnormality. Pancreas: No pancreatic ductal dilatation or surrounding inflammatory changes. Adrenals/Urinary Tract: Adrenal glands are unremarkable. Bilateral renal lesions measure upper limit of simple fluid attenuation, consistent with renal cysts. No hydronephrosis. Bladder is unremarkable. Stomach/Bowel: The stomach, small bowel and large bowel are normal in caliber without abnormal wall thickening or surrounding inflammatory changes. The appendix is normal. Reproductive: Prostate is unremarkable. Lymphatic: No enlarged lymph nodes in the  abdomen or pelvis. Other: No abdominopelvic ascites. Musculoskeletal: No aggressive osseous lesions. The soft tissues are unremarkable. IMPRESSION: VASCULAR 1. There is an infrarenal abdominal aortic aneurysm which measures up to 5.4 x 5.0 cm and extends to the iliac bifurcation. The infrarenal abdominal aorta superior to the aneurysm measures 2.0 cm. 2. 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 3. Diffuse atherosclerotic fibrofatty and calcific plaque involving the abdominal aorta and its branch vessels as described. NON-VASCULAR 1. 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 Although no discrete mass is visualized, there is an association with an increased incidence of gallbladder adenocarcinoma with this finding. Electronically Signed   By: Albin Felling M.D.   On: 10/21/2020 11:54    Assessment/Plan Essential hypertension, benign blood pressure control important in reducing the progression of atherosclerotic disease and aneurysmal degeneration. On appropriate oral medications.     Stroke [facility]) Remote, no recent symptoms.   Hyperlipidemia lipid control important in reducing the progression of atherosclerotic disease. Continue statin therapy  AAA (abdominal aortic aneurysm) without rupture ([place]) I have independently reviewed his CT angiogram of the abdomen and pelvis.  His aneurysm has had continued growth and now measures about 5.4 to 5.5 cm in maximal diameter.  He has a long infrarenal neck with the aneurysm largely in the distal aorta.  He reports no clear symptoms of the aneurysm, but with persistent growth and now measuring roughly 5-1/2 cm, particularly given his favorable anatomy for endovascular repair with a long aortic neck that may even allow doing this without general anesthesia, I think repair is now a reasonable option.  Risks and benefits were discussed with he and his family today and he is agreeable  to proceed.    Leotis Pain, MD  10/29/2020 3:58 PM    This note was created with Dragon medical transcription system.  Any errors from dictation are purely unintentional

## 2020-10-31 ENCOUNTER — Telehealth (INDEPENDENT_AMBULATORY_CARE_PROVIDER_SITE_OTHER): Payer: Self-pay

## 2020-10-31 NOTE — Telephone Encounter (Signed)
Patient daughter left a voicemail informing that her father weight is 147 lb and not 187 lb. She wanted to make the office aware with information.

## 2020-11-04 ENCOUNTER — Telehealth (INDEPENDENT_AMBULATORY_CARE_PROVIDER_SITE_OTHER): Payer: Self-pay

## 2020-11-04 NOTE — Telephone Encounter (Signed)
error 

## 2020-11-04 NOTE — Telephone Encounter (Signed)
I have sent over the Cardiac Clearance for the pt to [facility] clinic for [facility] to sign and fax back so his AA repair can be scheduled.

## 2020-11-13 ENCOUNTER — Ambulatory Visit (INDEPENDENT_AMBULATORY_CARE_PROVIDER_SITE_OTHER): Payer: Medicare Other | Admitting: Dermatology

## 2020-11-13 ENCOUNTER — Other Ambulatory Visit: Payer: Self-pay

## 2020-11-13 DIAGNOSIS — D485 Neoplasm of uncertain behavior of skin: Secondary | ICD-10-CM

## 2020-11-13 DIAGNOSIS — D0461 Carcinoma in situ of skin of right upper limb, including shoulder: Secondary | ICD-10-CM | POA: Diagnosis not present

## 2020-11-13 DIAGNOSIS — L578 Other skin changes due to chronic exposure to nonionizing radiation: Secondary | ICD-10-CM | POA: Diagnosis not present

## 2020-11-13 DIAGNOSIS — R0602 Shortness of breath: Secondary | ICD-10-CM | POA: Insufficient documentation

## 2020-11-13 NOTE — Patient Instructions (Addendum)
Wound Care Instructions  Cleanse wound gently with soap and water once a day then pat dry with clean gauze. Apply a thing coat of Petrolatum (petroleum jelly, "Vaseline") over the wound (unless you have an allergy to this). We recommend that you use a new, sterile tube of Vaseline. Do not pick or remove scabs. Do not remove the yellow or white "healing tissue" from the base of the wound.  Cover the wound with fresh, clean, nonstick gauze and secure with paper tape. You may use Band-Aids in place of gauze and tape if the would is small enough, but would recommend trimming much of the tape off as there is often too much. Sometimes Band-Aids can irritate the skin.  You should call the office for your biopsy report after 1 week if you have not already been contacted.  If you experience any problems, such as abnormal amounts of bleeding, swelling, significant bruising, significant pain, or evidence of infection, please call the office immediately.  FOR ADULT SURGERY PATIENTS: If you need something for pain relief you may take 1 extra strength Tylenol (acetaminophen) AND 2 Ibuprofen (200mg  each) together every 4 hours as needed for pain. (do not take these if you are allergic to them or if you have a reason you should not take them.) Typically, you may only need pain medication for 1 to 3 days.     If you have any questions or concerns for your doctor, please call our main line at [phone] and press option 4 to reach your doctor's medical assistant. If no one answers, please leave a voicemail as directed and we will return your call as soon as possible. Messages left after 4 pm will be answered the following business day.   You may also send [place] a message via [place]. We typically respond to MyChart messages within 1-2 business days.  For prescription refills, please ask your pharmacy to contact our office. Our fax number is [phone].  If you have an urgent issue when the clinic is closed that  cannot wait until the next business day, you can page your doctor at the number below.    Please note that while we do our best to be available for urgent issues outside of office hours, we are not available 24/7.   If you have an urgent issue and are unable to reach [place], you may choose to seek medical care at your doctor's office, retail clinic, urgent care center, or emergency room.  If you have a medical emergency, please immediately call 911 or go to the emergency department.  Pager Numbers  - Dr. Nehemiah Massed: [phone]  - Dr. Laurence Ferrari: [phone]  - Dr. Nicole Kindred: [phone]  In the event of inclement weather, please call our main line at [phone] for an update on the status of any delays or closures.  Dermatology Medication Tips: Please keep the boxes that topical medications come in in order to help keep track of the instructions about where and how to use these. Pharmacies typically print the medication instructions only on the boxes and not directly on the medication tubes.   If your medication is too expensive, please contact our office at [phone] option 4 or send [place] a message through [place].   We are unable to tell what your co-pay for medications will be in advance as this is different depending on your insurance coverage. However, we may be able to find a substitute medication at lower cost or fill out paperwork to get insurance to cover  a needed medication.   If a prior authorization is required to get your medication covered by your insurance company, please allow [place] 1-2 business days to complete this process.  Drug prices often vary depending on where the prescription is filled and some pharmacies may offer cheaper prices.  The website www.goodrx.com contains coupons for medications through different pharmacies. The prices here do not account for what the cost may be with help from insurance (it may be cheaper with your insurance), but the website can give you the  price if you did not use any insurance.  - You can print the associated coupon and take it with your prescription to the pharmacy.  - You may also stop by our office during regular business hours and pick up a GoodRx coupon card.  - If you need your prescription sent electronically to a different pharmacy, notify our office through [facility] or by phone at [phone] option 4. [phone]

## 2020-11-13 NOTE — Progress Notes (Signed)
Follow-Up Visit   Subjective  Steve Bryant is a 87 y.o. male who presents for the following: Other (Spot on right hand x ~1 year ). He has several other areas to be evaluated today.  Accompanied by granddaughter who contributes to history  The following portionsof the chart were reviewed this encounter and updated as appropriate:   Tobacco  Allergies  Meds  Problems  Med Hx  Surg Hx  Fam Hx     Review of Systems:  No other skin or systemic complaints except as noted in HPI or Assessment and Plan.  Objective  Well appearing patient in no apparent distress; mood and affect are within normal limits.  A focused examination was performed including hands, arms. Relevant physical exam findings are noted in the Assessment and Plan.  Right distal hand 2.1 cm hyperkeratotic papule  Right prox hand 1.5 cm hyperkeratotic papule   Assessment & Plan  Neoplasm of uncertain behavior of skin (2) Right distal hand  Epidermal / dermal shaving  Lesion diameter (cm):  2.1 Informed consent: discussed and consent obtained   Timeout: patient name, date of birth, surgical site, and procedure verified   Procedure prep:  Patient was prepped and draped in usual sterile fashion Prep type:  Isopropyl alcohol Anesthesia: the lesion was anesthetized in a standard fashion   Anesthetic:  1% lidocaine w/ epinephrine 1-100,000 buffered w/ 8.4% NaHCO3 Instrument used: flexible razor blade   Hemostasis achieved with: pressure, aluminum chloride and electrodesiccation   Outcome: patient tolerated procedure well   Post-procedure details: sterile dressing applied and wound care instructions given   Dressing type: bandage and petrolatum    Destruction of lesion Complexity: extensive   Destruction method: electrodesiccation and curettage   Informed consent: discussed and consent obtained   Timeout:  patient name, date of birth, surgical site, and procedure verified Procedure prep:  Patient was  prepped and draped in usual sterile fashion Prep type:  Isopropyl alcohol Anesthesia: the lesion was anesthetized in a standard fashion   Anesthetic:  1% lidocaine w/ epinephrine 1-100,000 buffered w/ 8.4% NaHCO3 Curettage performed in three different directions: Yes   Electrodesiccation performed over the curetted area: Yes   Lesion length (cm):  2.1 Lesion width (cm):  2.1 Margin per side (cm):  0.2 Final wound size (cm):  2.5 Hemostasis achieved with:  pressure and aluminum chloride Outcome: patient tolerated procedure well with no complications   Post-procedure details: sterile dressing applied and wound care instructions given   Dressing type: bandage and petrolatum    Specimen 1 - Surgical pathology Differential Diagnosis: SCC vs other  Check Margins: No EDC today  Right prox hand  Epidermal / dermal shaving  Lesion diameter (cm):  1.5 Informed consent: discussed and consent obtained   Timeout: patient name, date of birth, surgical site, and procedure verified   Procedure prep:  Patient was prepped and draped in usual sterile fashion Prep type:  Isopropyl alcohol Anesthesia: the lesion was anesthetized in a standard fashion   Anesthetic:  1% lidocaine w/ epinephrine 1-100,000 buffered w/ 8.4% NaHCO3 Instrument used: flexible razor blade   Hemostasis achieved with: pressure, aluminum chloride and electrodesiccation   Outcome: patient tolerated procedure well   Post-procedure details: sterile dressing applied and wound care instructions given   Dressing type: bandage and petrolatum    Destruction of lesion Complexity: extensive   Destruction method: electrodesiccation and curettage   Informed consent: discussed and consent obtained   Timeout:  patient name, date of  birth, surgical site, and procedure verified Procedure prep:  Patient was prepped and draped in usual sterile fashion Prep type:  Isopropyl alcohol Anesthesia: the lesion was anesthetized in a standard  fashion   Anesthetic:  1% lidocaine w/ epinephrine 1-100,000 buffered w/ 8.4% NaHCO3 Curettage performed in three different directions: Yes   Electrodesiccation performed over the curetted area: Yes   Lesion length (cm):  1.5 Lesion width (cm):  1.5 Margin per side (cm):  0.2 Final wound size (cm):  1.9 Hemostasis achieved with:  pressure and aluminum chloride Outcome: patient tolerated procedure well with no complications   Post-procedure details: sterile dressing applied and wound care instructions given   Dressing type: bandage and petrolatum    Specimen 2 - Surgical pathology Differential Diagnosis: SCC vs other  Check Margins: No EDC today  Actinic Damage - chronic, secondary to cumulative UV radiation exposure/sun exposure over time - diffuse scaly erythematous macules with underlying dyspigmentation - Recommend daily broad spectrum sunscreen SPF 30+ to sun-exposed areas, reapply every 2 hours as needed.  - Recommend staying in the shade or wearing long sleeves, sun glasses (UVA+UVB protection) and wide brim hats (4-inch brim around the entire circumference of the hat). - Call for new or changing lesions.  Return in about 4 months (around 03/15/2021).  I, Ashok Cordia, CMA, am acting as scribe for Sarina Ser, MD . Documentation: I have reviewed the above documentation for accuracy and completeness, and I agree with the above.  Sarina Ser, MD

## 2020-11-14 ENCOUNTER — Encounter: Payer: Self-pay | Admitting: Dermatology

## 2020-11-18 ENCOUNTER — Telehealth: Payer: Self-pay

## 2020-11-18 NOTE — Telephone Encounter (Signed)
-----   Message from Ralene Bathe, MD sent at 11/14/2020  5:27 PM EDT ----- Diagnosis 1. 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 Skin , right prox hand SQUAMOUS CELL CARCINOMA IN SITU, HYPERTROPHIC, CLOSE TO MARGIN  1&2 - both cancer = SCC in situ Superficial Both already treated  Recheck next visit

## 2020-11-18 NOTE — Telephone Encounter (Signed)
Advised patient's daughter of results/hd 

## 2020-12-19 ENCOUNTER — Encounter (INDEPENDENT_AMBULATORY_CARE_PROVIDER_SITE_OTHER): Payer: Self-pay

## 2020-12-20 ENCOUNTER — Telehealth (INDEPENDENT_AMBULATORY_CARE_PROVIDER_SITE_OTHER): Payer: Self-pay

## 2020-12-20 NOTE — Telephone Encounter (Signed)
Patient is schedule for AAA repair on 12/25/20 with Dr Lucky Cowboy arrival 6:45 am at [facility] and anesthesia was scheduled. Pre-op is schedule for 10/17 between 8-1 pm by phone and Covid test will be 10/18 between 8-12 pm at [place]. Pre-procedure instructions were gone over with patient daughter.

## 2020-12-22 ENCOUNTER — Encounter: Payer: Self-pay | Admitting: Vascular Surgery

## 2020-12-23 ENCOUNTER — Other Ambulatory Visit: Payer: Self-pay

## 2020-12-23 ENCOUNTER — Other Ambulatory Visit
Admission: RE | Admit: 2020-12-23 | Discharge: 2020-12-23 | Disposition: A | Discharge status: Home / Self Care | Payer: Medicare Other | Source: Ambulatory Visit | Attending: Vascular Surgery | Admitting: Vascular Surgery

## 2020-12-23 ENCOUNTER — Encounter: Payer: Self-pay | Admitting: Vascular Surgery

## 2020-12-23 ENCOUNTER — Other Ambulatory Visit (INDEPENDENT_AMBULATORY_CARE_PROVIDER_SITE_OTHER): Payer: Self-pay | Admitting: Nurse Practitioner

## 2020-12-23 DIAGNOSIS — I7143 Infrarenal abdominal aortic aneurysm, without rupture: Secondary | ICD-10-CM

## 2020-12-23 DIAGNOSIS — Z01818 Encounter for other preprocedural examination: Secondary | ICD-10-CM

## 2020-12-23 HISTORY — DX: Dyspnea, unspecified: R06.00

## 2020-12-23 HISTORY — DX: Heart failure, unspecified: I50.9

## 2020-12-23 HISTORY — DX: Pneumonia, unspecified organism: J18.9

## 2020-12-23 HISTORY — DX: Atherosclerotic heart disease of native coronary artery without angina pectoris: I25.10

## 2020-12-23 NOTE — Progress Notes (Signed)
Perioperative Services  Pre-Admission/Anesthesia Testing Clinical Review  Date: 12/24/20  Patient Demographics:  Name: Steve Bryant DOB:   08-14-1925 MRN:   [id number]  Planned Surgical Procedure(s):    Case: [id number] Date/Time: 12/25/20 0800   Procedure: ENDOVASCULAR REPAIR/STENT GRAFT   Anesthesia type: Moderate Sedation   Diagnosis: Abdominal aortic aneurysm (AAA) without rupture, unspecified part [I71.40]   Pre-op diagnosis: AAA Repair    GORE    AAA   Location: [facility]   Providers: Algernon Huxley, MD   NOTE: Available PAT nursing documentation and vital signs have been reviewed. Clinical nursing staff has updated patient's PMH/PSHx, current medication list, and drug allergies/intolerances to ensure comprehensive history available to assist in medical decision making as it pertains to the aforementioned surgical procedure and anticipated anesthetic course. Extensive review of available clinical information performed. [place] PMH and PSHx updated with any diagnoses/procedures that  may have been inadvertently omitted during his intake with the pre-admission testing department's nursing staff.  Clinical Discussion:  Steve Bryant is a 87 y.o. male who is submitted for pre-surgical anesthesia review and clearance prior to him undergoing the above procedure. Patient is a Former Smoker (25 pack years; quit in approximately 1979). Pertinent PMH includes: CAD, AAA, CHF, CVA, TIA, diastolic dysfunction, valvular regurgitation, PVD, aortic atherosclerosis, carotid artery disease, HTN, HLD, neck mass, asthma, DOE, OA, lumbar DDD, legally blind.  Patient is followed by cardiology Nehemiah Massed, MD). He was last seen in the cardiology clinic on 12/16/2020; notes reviewed. At the time of his clinic visit, the patient denied any chest pain, shortness of breath, PND, orthopnea, palpitations, significant peripheral edema, vertiginous symptoms, or presyncope/syncope.  Patient  with a PMH significant for cardiovascular diagnoses.  Patient with significant carotid artery disease dating back to 06/2009 when there was a 50-75% stenosis in the RICA and less than 50% stenosis in the LICA.  He has undergone serial surveillance; last CTA performed on 10/15/2016 revealed no significant flow-limiting stenosis in the RICA (? endarterectomy) and 60% stenosis of the proximal LICA.   Patient suffered an acute ischemic stroke on 10/16/2016.  MRI imaging revealed a 13 x 14 mm ischemic, nonhemorrhagic, RIGHT paramedian ventral pons territory infarct.  TTE performed on 10/17/2016 revealed a normal left ventricular systolic function with an EF of 55 to 60%.  There was mild aortic and mitral valve regurgitation with no evidence of stenosis.  Patient with known AAA discovered on abdominal ultrasound performed on 12/22/2016; measured 4.5 cm.  Subsequent CTA performed on 12/27/2018 revealed a fusiform infrarenal AAA measuring 4.5 cm.  Patient has undergone serial imaging; either by ultrasound or CTA, since diagnosis.  Last CTA imaging was performed on 10/21/2020 revealing an interval increase in size of the aneurysmal defect 5.4 x 5.0 cm extending to the iliac bifurcation.  Patient suffered a TIA on 04/15/2020.  TTE performed on 12/09/2020 revealed a normal left ventricular systolic function with an EF of >55%.  There was moderate mitral valve, mild aortic and tricuspid, and trivial pulmonary valve regurgitation.  There was no evidence of valvular stenosis.  Abdominal aorta enlarged up to 5.5 cm.  Myocardial perfusion imaging study performed on 12/09/2020 revealed a normal left ventricular systolic function with EF 51%.  There was normal wall motion and no evidence of stress-induced myocardial ischemia or arrhythmia.  Following CVA and given history of significant PVD, patient is on daily DAPT therapy (ASA + clopidogrel); compliant with therapy with no evidence of GI bleeding. Blood  pressure  well controlled 120/98 on currently prescribed ARB monotherapy.  Patient is on a statin for his HLD.  He is not diabetic. Functional capacity, as defined by DASI, is documented as being </= 4 METS.  No changes were made to his medication regimen.  Patient to follow-up with outpatient cardiology in 6 months or sooner if needed.  Steve Bryant is scheduled for an ENDOVASCULAR REPAIR/STENT GRAFT on 12/25/2020 with Dr. Leotis Pain, MD. Given patient's past medical history significant for cardiovascular diagnoses, presurgical cardiac clearance was sought by the performing surgeon's office and PAT team. "The patient is at the lowest risk possible for perioperative cardiovascular complications with the planned procedure.  The overall risk his procedure is low (<1%).  Currently has no evidence active and/or significant angina and/or congestive heart failure. Patient may proceed to surgery without restriction or need for further cardiovascular testing and an overall LOW risk".  Again, this patient is on daily DAPT therapy.  He has been instructed on recommendations from his vascular surgeon for continuing these medications throughout the perioperative period; patient will hold both medications on the morning of surgery.  Patient denies previous perioperative complications with anesthesia in the past.  In review his EMR, there are no records available for review pertaining to past procedural/anesthetic courses within the [facility] system.  Vitals with BMI 10/29/2020 05/31/2020 05/31/2020  Height 5\' 9"  - -  Weight 187 lbs - -  BMI 70.9 - -  Systolic 628 366 294  Diastolic 84 94 88  Pulse 61 91 86    Providers/Specialists:   NOTE: Primary physician provider listed below. Patient may have been seen by APP or partner within same practice.   PROVIDER ROLE / SPECIALTY LAST OV  Algernon Huxley, MD VASCULAR SURGERY (SURGEON) 10/29/2020  [name], MD PRIMARY CARE PROVIDER 10/29/2020  Serafina Royals, MD  CARDIOLOGY 12/16/2020   Allergies:  Shellfish-derived products  Current Home Medications:   No current facility-administered medications for this encounter.    aspirin EC 81 MG tablet   clopidogrel (PLAVIX) 75 MG tablet   dorzolamide (TRUSOPT) 2 % ophthalmic solution   dorzolamide-timolol (COSOPT) 22.3-6.8 MG/ML ophthalmic solution   gabapentin (NEURONTIN) 100 MG capsule   latanoprost (XALATAN) 0.005 % ophthalmic solution   losartan (COZAAR) 25 MG tablet   Multiple Vitamin (MULTIVITAMIN) tablet   pantoprazole (PROTONIX) 20 MG tablet   pregabalin (LYRICA) 75 MG capsule   QUEtiapine (SEROQUEL) 25 MG tablet   rosuvastatin (CRESTOR) 5 MG tablet   History:   Past Medical History:  Diagnosis Date   AAA (abdominal aortic aneurysm)    a.) Abd [place] 12/22/16: measured 4.5 cm. b.) CTA 12/27/18: fusiform infrarenal AAA measured 4.7 cm. c.) Abd [place] 07/04/19: measured 5.0 cm. d.) CTA 11/03/19: measured 4.9 cm. e.) CTA 10/21/20: interval increase in size to 5.4 x 5.0 cm extending to the iliac bifurcation.   Acute ischemic stroke ([place]) 10/16/2016   a.) MRI --> 13 x 14 mm ischemic, non-hemorrhagic, RIGHT paramedian ventral pons territory   Aortic atherosclerosis (HCC)    Arthritis    Asthma    Bilateral renal cysts    Bradycardia    Carotid artery disease ([place])    a.) Doppler 07/02/09: RIGHT 50-75%, LEFT < 50%. b.) CTA 07/04/09: RIGHT 90%. LEFT 50-75%. c.) CTA 10/15/2016: RIGHT with no significant stenosis, LEFT 60%.   CHF (congestive heart failure) (HCC)    Chronic anticoagulation    a.) DAPT therapy (ASA + clopidogrel)   Coronary artery  disease    DDD (degenerative disc disease), lumbar    Diastolic dysfunction 63/87/5643   a.) TTE 04/16/20: EF 60-65%; G1DD.   DOE (dyspnea on exertion)    Dyspnea    GERD (gastroesophageal reflux disease)    Glaucoma    Hyperlipidemia    Hypertension    Legally blind    Neck mass    a.) CT 07/04/09: 4.9 cm supraclavicular mass just posterior to  clavicle; PET CT 07/05/09 showed mass to be FDG avid with max SUV of 6. b.) CT 10/15/16: measured 4 x 5 cm. c.) CT 04/15/20: measured 5.1 x 3.7 cm.   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 WD SCC. EDC   Squamous cell carcinoma of skin 06/15/2019   Right calf. KA-type. EDC   Squamous cell carcinoma of skin 11/13/2020   Right distal hand (in situ) - EDC   Squamous cell carcinoma of skin 11/13/2020   Right prox hand (in situ) - EDC   TIA (transient ischemic attack) 04/15/2020   Valvular regurgitation    a.) TTE 10/17/2016 : EF 55-60%; mild AR/MR. b.) TTE 04/16/2020: EF 60-65%; G1DD, mild AR, triv. MR. c.) TTE 12/09/20: EF >55%; mild AR/TR, triv. PR, mod. MR.   Past Surgical History:  Procedure Laterality Date   CERVICAL LAMINECTOMY N/A 05/2006   Fusion C3-C7   GLAUCOMA SURGERY  2003   INGUINAL HERNIA REPAIR Right 2007   TUMOR REMOVAL Right 1977   Thoracotomy with RUL pulmonary mass resection   Family History  Problem Relation Age of Onset   Stroke Mother    Stroke Father    Cancer Brother    Social History   Tobacco Use   Smoking status: Former    Packs/day: 1.00    Years: 25.00    Pack years: 25.00    Types: Cigarettes    Quit date: 1979    Years since quitting: 43.8   Smokeless tobacco: Never  Vaping Use   Vaping Use: Never used  Substance Use Topics   Alcohol use: Not Currently    Comment: occasionally   Drug use: No    Pertinent Clinical Results:  LABS: Labs reviewed: Acceptable for surgery.  Hospital Outpatient Visit on 12/24/2020  Component Date Value Ref Range Status   aPTT 12/24/2020 31  24 - 36 seconds Final   Performed at [facility], [address] [place], [place] [address]   Prothrombin Time 12/24/2020 13.8  11.4 - 15.2 seconds Final   INR 12/24/2020 1.1  0.8 - 1.2 Final   Comment: (NOTE) INR goal varies based  on device and disease states. Performed at [facility], [address] [place], [place] [address]    WBC 12/24/2020 8.1  4.0 - 10.5 K/uL Final   RBC 12/24/2020 5.46  4.22 - 5.81 MIL/uL Final   Hemoglobin 12/24/2020 15.7  13.0 - 17.0 g/dL Final   HCT 12/24/2020 48.4  39.0 - 52.0 % Final   MCV 12/24/2020 88.6  80.0 - 100.0 fL Final   MCH 12/24/2020 28.8  26.0 - 34.0 pg Final   MCHC 12/24/2020 32.4  30.0 - 36.0 g/dL Final   RDW 12/24/2020 14.6  11.5 - 15.5 % Final   Platelets 12/24/2020 203  150 - 400 K/uL Final   nRBC 12/24/2020 0.0  0.0 - 0.2 % Final   Neutrophils Relative % 12/24/2020  59  % Final   Neutro Abs 12/24/2020 4.7  1.7 - 7.7 K/uL Final   Lymphocytes Relative 12/24/2020 27  % Final   Lymphs Abs 12/24/2020 2.2  0.7 - 4.0 K/uL Final   Monocytes Relative 12/24/2020 10  % Final   Monocytes Absolute 12/24/2020 0.8  0.1 - 1.0 K/uL Final   Eosinophils Relative 12/24/2020 3  % Final   Eosinophils Absolute 12/24/2020 0.3  0.0 - 0.5 K/uL Final   Basophils Relative 12/24/2020 1  % Final   Basophils Absolute 12/24/2020 0.1  0.0 - 0.1 K/uL Final   Immature Granulocytes 12/24/2020 0  % Final   Abs Immature Granulocytes 12/24/2020 0.03  0.00 - 0.07 K/uL Final   Performed at [facility], [place], [place] [address]   Sodium 12/24/2020 139  135 - 145 mmol/L Final   Potassium 12/24/2020 4.1  3.5 - 5.1 mmol/L Final   Chloride 12/24/2020 105  98 - 111 mmol/L Final   CO2 12/24/2020 26  22 - 32 mmol/L Final   Glucose, Bld 12/24/2020 87  70 - 99 mg/dL Final   Glucose reference range applies only to samples taken after fasting for at least 8 hours.   BUN 12/24/2020 25 (A) 8 - 23 mg/dL Final   Creatinine, Ser 12/24/2020 1.08  0.61 - 1.24 mg/dL Final   Calcium 12/24/2020 9.7  8.9 - 10.3 mg/dL Final   GFR, Estimated 12/24/2020 >60  >60 mL/min Final   Comment: (NOTE) Calculated using the CKD-EPI Creatinine Equation (2021)    Anion gap 12/24/2020 8  5 - 15  Final   Performed at [facility], [place], [place] [address]    ECG: Date: 12/24/2020 Time ECG obtained: 1053 AM Rate: 57 bpm Rhythm:  Sinus bradycardia with first-degree AV block  Axis (leads I and aVF): Normal Intervals: PR 240 ms. QRS 110 ms. QTc 443 ms. ST segment and T wave changes: No evidence of acute ST segment elevation or depression Comparison: Similar to previous tracing obtained on 11/13/2020   IMAGING / PROCEDURES: LEXISCAN performed on 12/09/2020  LVEF 51% Normal myocardial thickening and wall motion No artifacts noted Left ventricular cavity size normal No evidence of stress-induced myocardial ischemia or arrhythmia Normal low risk study  CTA ABDOMEN PELVIS WITH AND/OR WITHOUT CONTRAST performed on 10/21/2020 There is an infrarenal abdominal aortic aneurysm which measures up to 5.4 x 5.0 cm and extends to the iliac bifurcation. The infrarenal abdominal aorta superior to the aneurysm measures 2.0 cm. 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 Diffuse atherosclerotic fibrofatty and calcific plaque involving the abdominal aorta and its branch vessels as described. 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 Although no discrete mass is visualized, there is an association with an increased incidence of gallbladder adenocarcinoma with this finding.  CT CERVICAL WITHOUT CONTRAST PERFORMED ON 05/27/2020 No evidence of acute intracranial abnormality. No scalp swelling, calvarial fracture. Sequela of prior right parietal and bilateral cerebellar infarcts as well as remote appearing lacunar type infarcts in the bilateral basal ganglia, thalami and pons. Extensive paranasal sinus disease, left greater than right, with some or pneumatized secretions in the nasal passages as well. Hyperostotic changes of the frontal bone and sinus perimeter. Appearance possibly reflecting  combination of acute on chronic sinusitis, correlate with clinical symptoms. No acute fracture or traumatic listhesis of the cervical spine. Prior C3-C7 cervical fusion. Multilevel degenerative changes  of the cervical spine as described above Redemonstration of a 5.1 cm hypoattenuating soft tissue mass at the level of the right lung apex/superior mediastinum, unchanged in appearance from prior. Correlate with patient history and prior evaluation given that this was seen on comparison PET-CT 07/05/2009 demonstrating PET positivity at that time. Emphysema  TRANSTHORACIC ECHOCARDIOGRAM performed on 04/16/2020 Left ventricular ejection fraction, by estimation, is 60 to 65%. The left ventricle has normal function. Left ventricular endocardial border  not optimally defined to evaluate regional wall motion. Left ventricular diastolic parameters are consistent with Grade I diastolic dysfunction (impaired relaxation). Elevated left atrial pressure.  Right ventricular systolic function is normal. The right ventricular size is normal. Tricuspid regurgitation signal is inadequate for assessing PA pressure.  The mitral valve is grossly normal. Trivial mitral valve regurgitation. No evidence of mitral stenosis.  The aortic valve has an indeterminant number of cusps. There is moderate calcification of the aortic valve. There is moderate thickening of the aortic valve. Aortic valve regurgitation is mild. Mild to moderate aortic valve sclerosis/calcification is present, without any evidence of aortic stenosis.  Aortic dilatation noted. There is mild dilatation of the aortic root.   Impression and Plan:  Steve Bryant has been referred for pre-anesthesia review and clearance prior to him undergoing the planned anesthetic and procedural courses. Available labs, pertinent testing, and imaging results were personally reviewed by me. This patient has been appropriately cleared by cardiology with an overall LOW risk of  significant perioperative cardiovascular complications.  Based on clinical review performed today (12/24/20), barring any significant acute changes in the patient's overall condition, it is anticipated that he will be able to proceed with the planned surgical intervention. Any acute changes in clinical condition may necessitate his procedure being postponed and/or cancelled. Patient will meet with anesthesia team (MD and/or CRNA) on the day of his procedure for preoperative evaluation/assessment. Questions regarding anesthetic course will be fielded at that time.   Pre-surgical instructions were reviewed with the patient during his PAT appointment and questions were fielded by PAT clinical staff. Patient was advised that if any questions or concerns arise prior to his procedure then he should return a call to PAT and/or his surgeon's office to discuss.  Honor Loh, MSN, APRN, FNP-C, CEN [facility]  Peri-operative Services Nurse Practitioner Phone: [phone] Fax: [phone] 12/24/20 12:03 PM  NOTE: This note has been prepared using Dragon dictation software. Despite my best ability to proofread, there is always the potential that unintentional transcriptional errors may still occur from this process.

## 2020-12-23 NOTE — Patient Instructions (Addendum)
Your procedure is scheduled on: 12/25/20 - Wednesday Report to the Registration Desk on the 1st floor of the [facility]. To find out your arrival time, please call [phone] between 1PM - 3PM on: 12/24/20 - Tuesday Report to medical [facility] on 12/24/20 for Covid Test/ EKG/Labs  REMEMBER: Instructions that are not followed completely may result in serious medical risk, up to and including death; or upon the discretion of your surgeon and anesthesiologist your surgery may need to be rescheduled.  Do not eat food or drink any fluids after midnight the night before surgery.  No gum chewing, lozengers or hard candies.  TAKE THESE MEDICATIONS THE MORNING OF SURGERY WITH A SIP OF WATER:  - dorzolamide-timolol (COSOPT) 22.3-6.8 MG/ML ophthalmic solution - gabapentin (NEURONTIN) 100 MG capsule - pantoprazole (PROTONIX) 20 MG tablet, (take one the night before and one on the morning of surgery - helps to prevent nausea after surgery.) - pregabalin (LYRICA) 75 MG capsule  Follow recommendations from Cardiologist, Pulmonologist or PCP regarding stopping Aspirin, Coumadin, Plavix, Eliquis, Pradaxa, or Pletal.   One week prior to surgery: Stop Anti-inflammatories (NSAIDS) such as Advil, Aleve, Ibuprofen, Motrin, Naproxen, Naprosyn and Aspirin based products such as Excedrin, Goodys Powder, BC Powder.  Stop ANY OVER THE COUNTER supplements until after surgery.  You may however, continue to take Tylenol if needed for pain up until the day of surgery.  No Alcohol for 24 hours before or after surgery.  No Smoking including e-cigarettes for 24 hours prior to surgery.  No chewable tobacco products for at least 6 hours prior to surgery.  No nicotine patches on the day of surgery.  Do not use any "recreational" drugs for at least a week prior to your surgery.  Please be advised that the combination of cocaine and anesthesia may have negative outcomes, up to and including death. If you test positive  for cocaine, your surgery will be cancelled.  On the morning of surgery brush your teeth with toothpaste and water, you may rinse your mouth with mouthwash if you wish. Do not swallow any toothpaste or mouthwash.  Use CHG Soap or wipes as directed on instruction sheet.  Do not wear jewelry, make-up, hairpins, clips or nail polish.  Do not wear lotions, powders, or perfumes.   Do not shave body from the neck down 48 hours prior to surgery just in case you cut yourself which could leave a site for infection.  Also, freshly shaved skin may become irritated if using the CHG soap.  Contact lenses, hearing aids and dentures may not be worn into surgery.  Do not bring valuables to the hospital. [facility] is not responsible for any missing/lost belongings or valuables.   Notify your doctor if there is any change in your medical condition (cold, fever, infection).  Wear comfortable clothing (specific to your surgery type) to the hospital.  After surgery, you can help prevent lung complications by doing breathing exercises.  Take deep breaths and cough every 1-2 hours. Your doctor may order a device called an Incentive Spirometer to help you take deep breaths. When coughing or sneezing, hold a pillow firmly against your incision with both hands. This is called "splinting." Doing this helps protect your incision. It also decreases belly discomfort.  If you are being admitted to the hospital overnight, leave your suitcase in the car. After surgery it may be brought to your room.  If you are being discharged the day of surgery, you will not be allowed to  drive home. You will need a responsible adult (18 years or older) to drive you home and stay with you that night.   If you are taking public transportation, you will need to have a responsible adult (18 years or older) with you. Please confirm with your physician that it is acceptable to use public transportation.   Please call the  [facility] Dept. at [phone] if you have any questions about these instructions.  Surgery Visitation Policy:  Patients undergoing a surgery or procedure may have one family member or support person with them as long as that person is not COVID-19 positive or experiencing its symptoms.  That person may remain in the waiting area during the procedure and may rotate out with other people.  Inpatient Visitation:    Visiting hours are 7 a.m. to 8 p.m. Up to two visitors ages 16+ are allowed at one time in a patient room. The visitors may rotate out with other people during the day. Visitors must check out when they leave, or other visitors will not be allowed. One designated support person may remain overnight. The visitor must pass COVID-19 screenings, use hand sanitizer when entering and exiting the patient's room and wear a mask at all times, including in the patient's room. Patients must also wear a mask when staff or their visitor are in the room. Masking is required regardless of vaccination status.

## 2020-12-24 ENCOUNTER — Encounter: Payer: Self-pay | Admitting: Urgent Care

## 2020-12-24 ENCOUNTER — Encounter
Admission: RE | Admit: 2020-12-24 | Discharge: 2020-12-24 | Disposition: A | Discharge status: Home / Self Care | Payer: Medicare Other | Source: Ambulatory Visit | Attending: Vascular Surgery | Admitting: Vascular Surgery

## 2020-12-24 DIAGNOSIS — Z01818 Encounter for other preprocedural examination: Secondary | ICD-10-CM | POA: Insufficient documentation

## 2020-12-24 DIAGNOSIS — Z20822 Contact with and (suspected) exposure to covid-19: Secondary | ICD-10-CM | POA: Insufficient documentation

## 2020-12-24 DIAGNOSIS — I714 Abdominal aortic aneurysm, without rupture, unspecified: Secondary | ICD-10-CM | POA: Insufficient documentation

## 2020-12-24 LAB — BASIC METABOLIC PANEL
Anion gap: 8 (ref 5–15)
BUN: 25 mg/dL — ABNORMAL HIGH (ref 8–23)
CO2: 26 mmol/L (ref 22–32)
Calcium: 9.7 mg/dL (ref 8.9–10.3)
Chloride: 105 mmol/L (ref 98–111)
Creatinine, Ser: 1.08 mg/dL (ref 0.61–1.24)
GFR, Estimated: >60 mL/min (ref >60)
Glucose, Bld: 87 mg/dL (ref 70–99)
Potassium: 4.1 mmol/L (ref 3.5–5.1)
Sodium: 139 mmol/L (ref 135–145)

## 2020-12-24 LAB — CBC WITH DIFFERENTIAL/PLATELET
Abs Immature Granulocytes: 0.03 10*3/uL (ref 0.00–0.07)
Basophils Absolute: 0.1 10*3/uL (ref 0.0–0.1)
Basophils Relative: 1 %
Eosinophils Absolute: 0.3 10*3/uL (ref 0.0–0.5)
Eosinophils Relative: 3 %
HCT: 48.4 % (ref 39.0–52.0)
Hemoglobin: 15.7 g/dL (ref 13.0–17.0)
Immature Granulocytes: 0 %
Lymphocytes Relative: 27 %
Lymphs Abs: 2.2 10*3/uL (ref 0.7–4.0)
MCH: 28.8 pg (ref 26.0–34.0)
MCHC: 32.4 g/dL (ref 30.0–36.0)
MCV: 88.6 fL (ref 80.0–100.0)
Monocytes Absolute: 0.8 10*3/uL (ref 0.1–1.0)
Monocytes Relative: 10 %
Neutro Abs: 4.7 10*3/uL (ref 1.7–7.7)
Neutrophils Relative %: 59 %
Platelets: 203 10*3/uL (ref 150–400)
RBC: 5.46 MIL/uL (ref 4.22–5.81)
RDW: 14.6 % (ref 11.5–15.5)
WBC: 8.1 10*3/uL (ref 4.0–10.5)
nRBC: 0 % (ref 0.0–0.2)

## 2020-12-24 LAB — SARS CORONAVIRUS 2 (TAT 6-24 HRS): SARS Coronavirus 2: NEGATIVE

## 2020-12-24 LAB — APTT: aPTT: 31 seconds (ref 24–36)

## 2020-12-24 LAB — PROTIME-INR
INR: 1.1 (ref 0.8–1.2)
Prothrombin Time: 13.8 seconds (ref 11.4–15.2)

## 2020-12-25 ENCOUNTER — Encounter: Payer: Self-pay | Admitting: Vascular Surgery

## 2020-12-25 ENCOUNTER — Other Ambulatory Visit: Payer: Medicare Other

## 2020-12-25 ENCOUNTER — Inpatient Hospital Stay
Admission: RE | Admit: 2020-12-25 | Discharge: 2020-12-26 | DRG: 269 | Disposition: A | Discharge status: Home / Self Care | Payer: Medicare Other | Attending: Vascular Surgery | Admitting: Vascular Surgery

## 2020-12-25 ENCOUNTER — Other Ambulatory Visit: Payer: Self-pay

## 2020-12-25 ENCOUNTER — Inpatient Hospital Stay: Payer: Medicare Other | Admitting: Urgent Care

## 2020-12-25 ENCOUNTER — Encounter
Admission: RE | Disposition: A | Discharge status: Home / Self Care | Payer: Self-pay | Source: Home / Self Care | Attending: Vascular Surgery

## 2020-12-25 DIAGNOSIS — I7143 Infrarenal abdominal aortic aneurysm, without rupture: Secondary | ICD-10-CM | POA: Diagnosis present

## 2020-12-25 DIAGNOSIS — I2510 Atherosclerotic heart disease of native coronary artery without angina pectoris: Secondary | ICD-10-CM | POA: Diagnosis present

## 2020-12-25 DIAGNOSIS — K219 Gastro-esophageal reflux disease without esophagitis: Secondary | ICD-10-CM | POA: Diagnosis present

## 2020-12-25 DIAGNOSIS — I714 Abdominal aortic aneurysm, without rupture, unspecified: Secondary | ICD-10-CM | POA: Diagnosis present

## 2020-12-25 DIAGNOSIS — M1990 Unspecified osteoarthritis, unspecified site: Secondary | ICD-10-CM | POA: Diagnosis present

## 2020-12-25 DIAGNOSIS — H409 Unspecified glaucoma: Secondary | ICD-10-CM | POA: Diagnosis present

## 2020-12-25 DIAGNOSIS — Z79899 Other long term (current) drug therapy: Secondary | ICD-10-CM | POA: Diagnosis not present

## 2020-12-25 DIAGNOSIS — M5136 Other intervertebral disc degeneration, lumbar region: Secondary | ICD-10-CM | POA: Diagnosis present

## 2020-12-25 DIAGNOSIS — Z7902 Long term (current) use of antithrombotics/antiplatelets: Secondary | ICD-10-CM

## 2020-12-25 DIAGNOSIS — Z823 Family history of stroke: Secondary | ICD-10-CM

## 2020-12-25 DIAGNOSIS — Z20822 Contact with and (suspected) exposure to covid-19: Secondary | ICD-10-CM | POA: Diagnosis present

## 2020-12-25 DIAGNOSIS — Z7982 Long term (current) use of aspirin: Secondary | ICD-10-CM

## 2020-12-25 DIAGNOSIS — Z87891 Personal history of nicotine dependence: Secondary | ICD-10-CM | POA: Diagnosis not present

## 2020-12-25 DIAGNOSIS — Z8673 Personal history of transient ischemic attack (TIA), and cerebral infarction without residual deficits: Secondary | ICD-10-CM

## 2020-12-25 DIAGNOSIS — I700 Atherosclerosis of aorta: Secondary | ICD-10-CM | POA: Diagnosis present

## 2020-12-25 DIAGNOSIS — E785 Hyperlipidemia, unspecified: Secondary | ICD-10-CM | POA: Diagnosis present

## 2020-12-25 DIAGNOSIS — J45909 Unspecified asthma, uncomplicated: Secondary | ICD-10-CM | POA: Diagnosis present

## 2020-12-25 DIAGNOSIS — I10 Essential (primary) hypertension: Secondary | ICD-10-CM | POA: Diagnosis present

## 2020-12-25 DIAGNOSIS — Z85828 Personal history of other malignant neoplasm of skin: Secondary | ICD-10-CM | POA: Diagnosis not present

## 2020-12-25 DIAGNOSIS — Z981 Arthrodesis status: Secondary | ICD-10-CM | POA: Diagnosis not present

## 2020-12-25 HISTORY — DX: Cyst of kidney, acquired: N28.1

## 2020-12-25 HISTORY — DX: Unspecified asthma, uncomplicated: J45.909

## 2020-12-25 HISTORY — DX: Other intervertebral disc degeneration, lumbar region without mention of lumbar back pain or lower extremity pain: M51.369

## 2020-12-25 HISTORY — DX: Legal blindness, as defined in USA: H54.8

## 2020-12-25 HISTORY — DX: Localized swelling, mass and lump, neck: R22.1

## 2020-12-25 HISTORY — PX: ENDOVASCULAR REPAIR/STENT GRAFT: CATH118280

## 2020-12-25 HISTORY — DX: Long term (current) use of anticoagulants: Z79.01

## 2020-12-25 HISTORY — DX: Disorder of arteries and arterioles, unspecified: I77.9

## 2020-12-25 HISTORY — DX: Other forms of dyspnea: R06.09

## 2020-12-25 HISTORY — DX: Other intervertebral disc degeneration, lumbar region: M51.36

## 2020-12-25 HISTORY — DX: Bradycardia, unspecified: R00.1

## 2020-12-25 HISTORY — DX: Gastro-esophageal reflux disease without esophagitis: K21.9

## 2020-12-25 HISTORY — DX: Atherosclerosis of aorta: I70.0

## 2020-12-25 HISTORY — DX: Peripheral vascular disease, unspecified: I73.9

## 2020-12-25 HISTORY — DX: Endocarditis, valve unspecified: I38

## 2020-12-25 LAB — TYPE AND SCREEN
ABO/RH(D): O POS
ABO/RH(D): O POS
Antibody Screen: NEGATIVE
Antibody Screen: NEGATIVE

## 2020-12-25 LAB — GLUCOSE, CAPILLARY
Glucose-Capillary: 115 mg/dL — ABNORMAL HIGH (ref 70–99)
Glucose-Capillary: 132 mg/dL — ABNORMAL HIGH (ref 70–99)

## 2020-12-25 LAB — MRSA NEXT GEN BY PCR, NASAL: MRSA by PCR Next Gen: NOT DETECTED

## 2020-12-25 SURGERY — ENDOVASCULAR STENT GRAFT (AAA)
Anesthesia: General

## 2020-12-25 MED ORDER — NITROGLYCERIN IN D5W 200-5 MCG/ML-% IV SOLN
INTRAVENOUS | Status: AC
  Filled 2020-12-25: qty 250

## 2020-12-25 MED ORDER — ROCURONIUM BROMIDE 10 MG/ML (PF) SYRINGE
PREFILLED_SYRINGE | INTRAVENOUS | Status: AC
  Filled 2020-12-25: qty 10

## 2020-12-25 MED ORDER — IODIXANOL 320 MG/ML IV SOLN
INTRAVENOUS | Status: DC | PRN
  Administered 2020-12-25: 55 mL via INTRA_ARTERIAL

## 2020-12-25 MED ORDER — ONDANSETRON HCL 4 MG/2ML IJ SOLN: 4.0000 mg | Freq: Four times a day (QID) | INTRAMUSCULAR | Status: DC | PRN

## 2020-12-25 MED ORDER — LATANOPROST 0.005 % OP SOLN
1.0000 [drp] | Freq: Every day | OPHTHALMIC | Status: DC
  Administered 2020-12-25: 1 [drp] via OPHTHALMIC
  Filled 2020-12-25: qty 2.5

## 2020-12-25 MED ORDER — METOPROLOL TARTRATE 5 MG/5ML IV SOLN: 2.0000 mg | INTRAVENOUS | Status: DC | PRN

## 2020-12-25 MED ORDER — HEPARIN SODIUM (PORCINE) 1000 UNIT/ML IJ SOLN
INTRAMUSCULAR | Status: AC
  Filled 2020-12-25: qty 1

## 2020-12-25 MED ORDER — POTASSIUM CHLORIDE CRYS ER 20 MEQ PO TBCR: 20.0000 meq | EXTENDED_RELEASE_TABLET | Freq: Every day | ORAL | Status: DC | PRN

## 2020-12-25 MED ORDER — GUAIFENESIN-DM 100-10 MG/5ML PO SYRP: 15.0000 mL | ORAL_SOLUTION | ORAL | Status: DC | PRN

## 2020-12-25 MED ORDER — LABETALOL HCL 5 MG/ML IV SOLN: 10.0000 mg | INTRAVENOUS | Status: DC | PRN

## 2020-12-25 MED ORDER — FAMOTIDINE IN NACL 20-0.9 MG/50ML-% IV SOLN
20.0000 mg | Freq: Two times a day (BID) | INTRAVENOUS | Status: DC
  Administered 2020-12-25 – 2020-12-26 (×2): 20 mg via INTRAVENOUS
  Filled 2020-12-25 (×2): qty 50

## 2020-12-25 MED ORDER — DIPHENHYDRAMINE HCL 50 MG/ML IJ SOLN
INTRAMUSCULAR | Status: AC
  Administered 2020-12-25: 50 mg via INTRAVENOUS
  Filled 2020-12-25: qty 1

## 2020-12-25 MED ORDER — SODIUM CHLORIDE 0.9 % IV SOLN: 500.0000 mL | Freq: Once | INTRAVENOUS | Status: DC | PRN

## 2020-12-25 MED ORDER — PROPOFOL 10 MG/ML IV BOLUS
INTRAVENOUS | Status: AC
  Filled 2020-12-25: qty 20

## 2020-12-25 MED ORDER — HEPARIN SODIUM (PORCINE) 1000 UNIT/ML IJ SOLN
INTRAMUSCULAR | Status: DC | PRN
  Administered 2020-12-25: 5000 [IU] via INTRAVENOUS

## 2020-12-25 MED ORDER — METHYLPREDNISOLONE SODIUM SUCC 125 MG IJ SOLR
INTRAMUSCULAR | Status: AC
  Administered 2020-12-25: 125 mg via INTRAVENOUS
  Filled 2020-12-25: qty 2

## 2020-12-25 MED ORDER — METHYLPREDNISOLONE SODIUM SUCC 125 MG IJ SOLR: 125.0000 mg | Freq: Once | INTRAMUSCULAR | Status: AC

## 2020-12-25 MED ORDER — HYDRALAZINE HCL 20 MG/ML IJ SOLN
INTRAMUSCULAR | Status: AC
  Filled 2020-12-25: qty 1

## 2020-12-25 MED ORDER — CEFAZOLIN SODIUM-DEXTROSE 2-4 GM/100ML-% IV SOLN
2.0000 g | INTRAVENOUS | Status: AC
  Administered 2020-12-25: 2 g via INTRAVENOUS

## 2020-12-25 MED ORDER — FAMOTIDINE 20 MG PO TABS
ORAL_TABLET | ORAL | Status: AC
  Administered 2020-12-25: 40 mg via ORAL
  Filled 2020-12-25: qty 2

## 2020-12-25 MED ORDER — SUGAMMADEX SODIUM 200 MG/2ML IV SOLN
INTRAVENOUS | Status: DC | PRN
  Administered 2020-12-25: 200 mg via INTRAVENOUS

## 2020-12-25 MED ORDER — SODIUM CHLORIDE 0.9 % IV SOLN: INTRAVENOUS | Status: AC

## 2020-12-25 MED ORDER — CHLORHEXIDINE GLUCONATE 0.12 % MT SOLN
15.0000 mL | Freq: Once | OROMUCOSAL | Status: DC
  Filled 2020-12-25: qty 15

## 2020-12-25 MED ORDER — FAMOTIDINE 20 MG PO TABS: 40.0000 mg | ORAL_TABLET | Freq: Once | ORAL | Status: AC

## 2020-12-25 MED ORDER — PANTOPRAZOLE SODIUM 20 MG PO TBEC
20.0000 mg | DELAYED_RELEASE_TABLET | Freq: Every day | ORAL | Status: DC
  Administered 2020-12-26: 20 mg via ORAL
  Filled 2020-12-25: qty 1

## 2020-12-25 MED ORDER — FENTANYL CITRATE (PF) 100 MCG/2ML IJ SOLN
INTRAMUSCULAR | Status: DC | PRN
  Administered 2020-12-25: 50 ug via INTRAVENOUS

## 2020-12-25 MED ORDER — PHENYLEPHRINE HCL-NACL 20-0.9 MG/250ML-% IV SOLN
INTRAVENOUS | Status: AC
  Filled 2020-12-25: qty 250

## 2020-12-25 MED ORDER — ONDANSETRON HCL 4 MG/2ML IJ SOLN
INTRAMUSCULAR | Status: AC
  Filled 2020-12-25: qty 2

## 2020-12-25 MED ORDER — FENTANYL CITRATE (PF) 100 MCG/2ML IJ SOLN: 25.0000 ug | INTRAMUSCULAR | Status: DC | PRN

## 2020-12-25 MED ORDER — DOPAMINE-DEXTROSE 3.2-5 MG/ML-% IV SOLN
3.0000 ug/kg/min | INTRAVENOUS | Status: DC
Start: 2020-12-25 — End: 2020-12-26

## 2020-12-25 MED ORDER — OXYCODONE-ACETAMINOPHEN 5-325 MG PO TABS
1.0000 | ORAL_TABLET | ORAL | Status: DC | PRN
  Administered 2020-12-25: 1 via ORAL
  Filled 2020-12-25: qty 1

## 2020-12-25 MED ORDER — NITROGLYCERIN IN D5W 200-5 MCG/ML-% IV SOLN
5.0000 ug/min | INTRAVENOUS | Status: DC
  Administered 2020-12-25: 5 ug/min via INTRAVENOUS
  Filled 2020-12-25: qty 250

## 2020-12-25 MED ORDER — ASPIRIN EC 81 MG PO TBEC
81.0000 mg | DELAYED_RELEASE_TABLET | Freq: Every day | ORAL | Status: DC
  Administered 2020-12-25 – 2020-12-26 (×2): 81 mg via ORAL
  Filled 2020-12-25 (×2): qty 1

## 2020-12-25 MED ORDER — GABAPENTIN 100 MG PO CAPS
100.0000 mg | ORAL_CAPSULE | Freq: Two times a day (BID) | ORAL | Status: DC
  Administered 2020-12-25 – 2020-12-26 (×2): 100 mg via ORAL
  Filled 2020-12-25 (×2): qty 1

## 2020-12-25 MED ORDER — ONDANSETRON HCL 4 MG/2ML IJ SOLN: 4.0000 mg | Freq: Once | INTRAMUSCULAR | Status: DC | PRN

## 2020-12-25 MED ORDER — BUPIVACAINE-EPINEPHRINE (PF) 0.25% -1:200000 IJ SOLN
INTRAMUSCULAR | Status: AC
  Filled 2020-12-25: qty 30

## 2020-12-25 MED ORDER — ROSUVASTATIN CALCIUM 5 MG PO TABS
5.0000 mg | ORAL_TABLET | Freq: Every day | ORAL | Status: DC
  Administered 2020-12-25: 5 mg via ORAL
  Filled 2020-12-25 (×2): qty 1

## 2020-12-25 MED ORDER — ROCURONIUM BROMIDE 100 MG/10ML IV SOLN
INTRAVENOUS | Status: DC | PRN
  Administered 2020-12-25: 20 mg via INTRAVENOUS
  Administered 2020-12-25: 40 mg via INTRAVENOUS
  Administered 2020-12-25: 10 mg via INTRAVENOUS

## 2020-12-25 MED ORDER — EPHEDRINE SULFATE 50 MG/ML IJ SOLN
INTRAMUSCULAR | Status: DC | PRN
Start: 2020-12-25 — End: 2020-12-25
  Administered 2020-12-25: 5 mg via INTRAVENOUS
  Administered 2020-12-25 (×3): 10 mg via INTRAVENOUS
  Administered 2020-12-25: 5 mg via INTRAVENOUS

## 2020-12-25 MED ORDER — ZIPRASIDONE MESYLATE 20 MG IM SOLR
10.0000 mg | Freq: Once | INTRAMUSCULAR | Status: AC
  Administered 2020-12-25: 10 mg via INTRAMUSCULAR
  Filled 2020-12-25: qty 20

## 2020-12-25 MED ORDER — LOSARTAN POTASSIUM 25 MG PO TABS
25.0000 mg | ORAL_TABLET | Freq: Every day | ORAL | Status: DC
  Administered 2020-12-25 – 2020-12-26 (×2): 25 mg via ORAL
  Filled 2020-12-25 (×2): qty 1

## 2020-12-25 MED ORDER — DORZOLAMIDE HCL-TIMOLOL MAL 2-0.5 % OP SOLN
1.0000 [drp] | Freq: Two times a day (BID) | OPHTHALMIC | Status: DC
  Administered 2020-12-25 – 2020-12-26 (×2): 1 [drp] via OPHTHALMIC
  Filled 2020-12-25: qty 10

## 2020-12-25 MED ORDER — HYDRALAZINE HCL 20 MG/ML IJ SOLN
5.0000 mg | INTRAMUSCULAR | Status: DC | PRN
Start: 2020-12-25 — End: 2020-12-26
  Administered 2020-12-25: 5 mg via INTRAVENOUS

## 2020-12-25 MED ORDER — ORAL CARE MOUTH RINSE: 15.0000 mL | Freq: Once | OROMUCOSAL | Status: DC

## 2020-12-25 MED ORDER — FENTANYL CITRATE (PF) 100 MCG/2ML IJ SOLN
INTRAMUSCULAR | Status: AC
  Filled 2020-12-25: qty 2

## 2020-12-25 MED ORDER — PHENYLEPHRINE HCL-NACL 20-0.9 MG/250ML-% IV SOLN
INTRAVENOUS | Status: DC | PRN
  Administered 2020-12-25: 50 ug/min via INTRAVENOUS

## 2020-12-25 MED ORDER — PREGABALIN 75 MG PO CAPS
75.0000 mg | ORAL_CAPSULE | Freq: Three times a day (TID) | ORAL | Status: DC
  Administered 2020-12-25 – 2020-12-26 (×4): 75 mg via ORAL
  Filled 2020-12-25 (×4): qty 1

## 2020-12-25 MED ORDER — LACTATED RINGERS IV SOLN: INTRAVENOUS | Status: DC

## 2020-12-25 MED ORDER — EPHEDRINE 5 MG/ML INJ
INTRAVENOUS | Status: AC
  Filled 2020-12-25: qty 5

## 2020-12-25 MED ORDER — HYDROMORPHONE HCL 1 MG/ML IJ SOLN: 1.0000 mg | Freq: Once | INTRAMUSCULAR | Status: DC | PRN

## 2020-12-25 MED ORDER — PROPOFOL 10 MG/ML IV BOLUS
INTRAVENOUS | Status: DC | PRN
  Administered 2020-12-25: 100 mg via INTRAVENOUS
  Administered 2020-12-25: 10 mg via INTRAVENOUS

## 2020-12-25 MED ORDER — CEFAZOLIN SODIUM-DEXTROSE 2-4 GM/100ML-% IV SOLN
2.0000 g | Freq: Three times a day (TID) | INTRAVENOUS | Status: AC
  Administered 2020-12-25 – 2020-12-26 (×2): 2 g via INTRAVENOUS
  Filled 2020-12-25 (×2): qty 100

## 2020-12-25 MED ORDER — PHENYLEPHRINE HCL (PRESSORS) 10 MG/ML IV SOLN
INTRAVENOUS | Status: AC
  Filled 2020-12-25: qty 1

## 2020-12-25 MED ORDER — DIPHENHYDRAMINE HCL 50 MG/ML IJ SOLN: 50.0000 mg | Freq: Once | INTRAMUSCULAR | Status: AC

## 2020-12-25 MED ORDER — CEFAZOLIN SODIUM-DEXTROSE 2-4 GM/100ML-% IV SOLN
INTRAVENOUS | Status: AC
  Filled 2020-12-25: qty 100

## 2020-12-25 MED ORDER — QUETIAPINE FUMARATE 25 MG PO TABS
25.0000 mg | ORAL_TABLET | ORAL | Status: DC
  Administered 2020-12-25: 25 mg via ORAL
  Filled 2020-12-25: qty 1

## 2020-12-25 MED ORDER — ONDANSETRON HCL 4 MG/2ML IJ SOLN
INTRAMUSCULAR | Status: DC | PRN
  Administered 2020-12-25: 4 mg via INTRAVENOUS

## 2020-12-25 MED ORDER — INFLUENZA VAC A&B SA ADJ QUAD 0.5 ML IM PRSY
0.5000 mL | PREFILLED_SYRINGE | INTRAMUSCULAR | Status: DC | PRN
  Filled 2020-12-25: qty 0.5

## 2020-12-25 MED ORDER — DORZOLAMIDE HCL 2 % OP SOLN
1.0000 [drp] | Freq: Every day | OPHTHALMIC | Status: DC
  Administered 2020-12-25 – 2020-12-26 (×2): 1 [drp] via OPHTHALMIC
  Filled 2020-12-25: qty 10

## 2020-12-25 MED ORDER — ALUM & MAG HYDROXIDE-SIMETH 200-200-20 MG/5ML PO SUSP: 15.0000 mL | ORAL | Status: DC | PRN

## 2020-12-25 MED ORDER — DOCUSATE SODIUM 100 MG PO CAPS
100.0000 mg | ORAL_CAPSULE | Freq: Every day | ORAL | Status: DC
  Administered 2020-12-26: 100 mg via ORAL
  Filled 2020-12-25: qty 1

## 2020-12-25 MED ORDER — PHENOL 1.4 % MT LIQD
1.0000 | OROMUCOSAL | Status: DC | PRN
  Filled 2020-12-25: qty 177

## 2020-12-25 MED ORDER — CLOPIDOGREL BISULFATE 75 MG PO TABS
75.0000 mg | ORAL_TABLET | Freq: Every day | ORAL | Status: DC
  Administered 2020-12-25 – 2020-12-26 (×2): 75 mg via ORAL
  Filled 2020-12-25 (×2): qty 1

## 2020-12-25 MED ORDER — CHLORHEXIDINE GLUCONATE CLOTH 2 % EX PADS: 6.0000 | MEDICATED_PAD | Freq: Once | CUTANEOUS | Status: DC

## 2020-12-25 MED ORDER — MAGNESIUM SULFATE 2 GM/50ML IV SOLN
2.0000 g | Freq: Every day | INTRAVENOUS | Status: DC | PRN
  Filled 2020-12-25: qty 50

## 2020-12-25 MED ORDER — ACETAMINOPHEN 650 MG RE SUPP: 325.0000 mg | RECTAL | Status: DC | PRN

## 2020-12-25 MED ORDER — ADULT MULTIVITAMIN W/MINERALS CH
1.0000 | ORAL_TABLET | Freq: Every day | ORAL | Status: DC
  Administered 2020-12-25 – 2020-12-26 (×2): 1 via ORAL
  Filled 2020-12-25 (×2): qty 1

## 2020-12-25 MED ORDER — LIDOCAINE HCL (CARDIAC) PF 100 MG/5ML IV SOSY
PREFILLED_SYRINGE | INTRAVENOUS | Status: DC | PRN
  Administered 2020-12-25: 60 mg via INTRAVENOUS

## 2020-12-25 MED ORDER — LIDOCAINE HCL (PF) 2 % IJ SOLN
INTRAMUSCULAR | Status: AC
  Filled 2020-12-25: qty 5

## 2020-12-25 MED ORDER — ACETAMINOPHEN 325 MG PO TABS: 325.0000 mg | ORAL_TABLET | ORAL | Status: DC | PRN

## 2020-12-25 MED ORDER — CHLORHEXIDINE GLUCONATE CLOTH 2 % EX PADS
6.0000 | MEDICATED_PAD | Freq: Once | CUTANEOUS | Status: AC
  Administered 2020-12-25: 6 via TOPICAL

## 2020-12-25 MED ORDER — PHENYLEPHRINE HCL (PRESSORS) 10 MG/ML IV SOLN
INTRAVENOUS | Status: DC | PRN
  Administered 2020-12-25: 50 ug via INTRAVENOUS
  Administered 2020-12-25 (×2): 100 ug via INTRAVENOUS
  Administered 2020-12-25: 50 ug via INTRAVENOUS

## 2020-12-25 MED ORDER — MORPHINE SULFATE (PF) 4 MG/ML IV SOLN
2.0000 mg | INTRAVENOUS | Status: DC | PRN
  Administered 2020-12-26: 2 mg via INTRAVENOUS
  Filled 2020-12-25: qty 1

## 2020-12-25 SURGICAL SUPPLY — 33 items
BALLN DORADO 10X80X80 (BALLOONS) ×4
BALLN DORADO 7X80X80 (BALLOONS) ×2
BALLOON DORADO 10X80X80 (BALLOONS) IMPLANT
BALLOON DORADO 7X80X80 (BALLOONS) IMPLANT
CATH ACCU-VU SIZ PIG 5F 70CM (CATHETERS) ×1 IMPLANT
CATH BALLN CODA 9X100X32 (BALLOONS) ×1 IMPLANT
CATH BEACON 5 .035 40 KMP TP (CATHETERS) IMPLANT
CATH BEACON 5 .038 40 KMP TP (CATHETERS) ×2
CLOSURE PERCLOSE PROSTYLE (VASCULAR PRODUCTS) ×11 IMPLANT
COVER DRAPE FLUORO 36X44 (DRAPES) ×2 IMPLANT
COVER PROBE U/S 5X48 (MISCELLANEOUS) ×1 IMPLANT
DEVICE SAFEGUARD 24CM (GAUZE/BANDAGES/DRESSINGS) ×2 IMPLANT
DEVICE TORQUE .025-.038 (MISCELLANEOUS) ×1 IMPLANT
DRYSEAL FLEXSHEATH 12FR 33CM (SHEATH) ×1
DRYSEAL FLEXSHEATH 18FR 33CM (SHEATH) ×1
EXCLDR TRNK 28.5X14.5X12 16F (Endovascular Graft) ×2 IMPLANT
EXCLUDER TNK 28.5X14.5X12 16F (Endovascular Graft) IMPLANT
EXTENDER ENDOPROSTHESIS 12X7 (Endovascular Graft) ×1 IMPLANT
GLIDEWIRE STIFF .35X180X3 HYDR (WIRE) ×1 IMPLANT
KIT ENCORE 26 ADVANTAGE (KITS) ×2 IMPLANT
LEG CONTRALATERAL 16X12X10 (Vascular Products) ×2 IMPLANT
PACK ANGIOGRAPHY (CUSTOM PROCEDURE TRAY) ×2 IMPLANT
SET INTRO CAPELLA COAXIAL (SET/KITS/TRAYS/PACK) ×1 IMPLANT
SHEATH BRITE TIP 6FRX11 (SHEATH) ×2 IMPLANT
SHEATH BRITE TIP 8FRX11 (SHEATH) ×2 IMPLANT
SHEATH DRYSEAL FLEX 12FR 33CM (SHEATH) IMPLANT
SHEATH DRYSEAL FLEX 18FR 33CM (SHEATH) IMPLANT
SPONGE XRAY 4X4 16PLY STRL (MISCELLANEOUS) ×3 IMPLANT
STENT GRAFT CONTRALAT 16X12X10 (Vascular Products) IMPLANT
SYR MEDRAD MARK 7 150ML (SYRINGE) ×1 IMPLANT
TUBING CONTRAST HIGH PRESS 72 (TUBING) ×1 IMPLANT
WIRE AMPLATZ SSTIFF .035X260CM (WIRE) ×2 IMPLANT
WIRE GUIDERIGHT .035X150 (WIRE) ×2 IMPLANT

## 2020-12-25 NOTE — H&P (Signed)
[facility] Admission History & Physical  MRN : [id number]  Steve Bryant is a 87 y.o. ([birth year]) male who presents with chief complaint of No chief complaint on file. [name]  History of Present Illness: Patient presents for repair of his 5 and half centimeter abdominal aortic aneurysm.  No changes since his last visit in the office 2 months ago.  Current Facility-Administered Medications  Medication Dose Route Frequency Provider Last Rate Last Admin   ceFAZolin (ANCEF) IVPB 2g/100 mL premix  2 g Intravenous On Call to OR Kris Hartmann, NP       chlorhexidine (PERIDEX) 0.12 % solution 15 mL  15 mL Mouth/Throat Once Molli Barrows, MD       Or   MEDLINE mouth rinse  15 mL Mouth Rinse Once Molli Barrows, MD       Chlorhexidine Gluconate Cloth 2 % PADS 6 each  6 each Topical Once Kris Hartmann, NP       diphenhydrAMINE (BENADRYL) 50 MG/ML injection            diphenhydrAMINE (BENADRYL) injection 50 mg  50 mg Intravenous Once Algernon Huxley, MD       famotidine (PEPCID) 20 MG tablet            famotidine (PEPCID) tablet 40 mg  40 mg Oral Once Algernon Huxley, MD       HYDROmorphone (DILAUDID) injection 1 mg  1 mg Intravenous Once PRN Kris Hartmann, NP       lactated ringers infusion   Intravenous Continuous Molli Barrows, MD 10 mL/hr at 12/25/20 0746 New Bag at 12/25/20 0746   methylPREDNISolone sodium succinate (SOLU-MEDROL) 125 mg/2 mL injection 125 mg  125 mg Intravenous Once Algernon Huxley, MD       methylPREDNISolone sodium succinate (SOLU-MEDROL) 125 mg/2 mL injection            ondansetron (ZOFRAN) injection 4 mg  4 mg Intravenous Q6H PRN Kris Hartmann, NP        Past Medical History:  Diagnosis Date   AAA (abdominal aortic aneurysm)    a.) Abd [place] 12/22/16: measured 4.5 cm. b.) CTA 12/27/18: fusiform infrarenal AAA measured 4.7 cm. c.) Abd [place] 07/04/19: measured 5.0 cm. d.) CTA 11/03/19: measured 4.9 cm. e.) CTA 10/21/20: interval increase in size to 5.4  x 5.0 cm extending to the iliac bifurcation.   Acute ischemic stroke ([place]) 10/16/2016   a.) MRI --> 13 x 14 mm ischemic, non-hemorrhagic, RIGHT paramedian ventral pons territory   Aortic atherosclerosis (HCC)    Arthritis    Bilateral renal cysts    Bradycardia    Carotid artery disease ([place])    a.) Doppler 07/02/09: RIGHT 50-75%, LEFT < 50%. b.) CTA 07/04/09: RIGHT 90%. LEFT 50-75%. c.) CTA 10/15/2016: RIGHT with no significant stenosis, LEFT 60%.   CHF (congestive heart failure) (HCC)    Chronic anticoagulation    a.) DAPT therapy (ASA + clopidogrel)   Coronary artery disease    DDD (degenerative disc disease), lumbar    Diastolic dysfunction 92/33/0076   a.) TTE 04/16/20: EF 60-65%; G1DD.   DOE (dyspnea on exertion)    Dyspnea    GERD (gastroesophageal reflux disease)    Glaucoma    Hyperlipidemia    Hypertension    Legally blind    Neck mass    a.) CT 07/04/09: 4.9 cm supraclavicular mass just posterior to clavicle; PET CT 07/05/09 showed mass  to be FDG avid with max SUV of 6. b.) CT 10/15/16: measured 4 x 5 cm. c.) CT 04/15/20: measured 5.1 x 3.7 cm.   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 WD SCC. EDC   Squamous cell carcinoma of skin 06/15/2019   Right calf. KA-type. EDC   Squamous cell carcinoma of skin 11/13/2020   Right distal hand (in situ) - EDC   Squamous cell carcinoma of skin 11/13/2020   Right prox hand (in situ) - EDC   TIA (transient ischemic attack) 04/15/2020   Valvular regurgitation    a.) TTE 10/17/2016 : EF 55-60%; mild AR/MR. b.) TTE 04/16/2020: EF 60-65%; G1DD, mild AR, triv. MR. c.) TTE 12/09/20: EF >55%; mild AR/TR, triv. PR, mod. MR.    Past Surgical History:  Procedure Laterality Date   CERVICAL LAMINECTOMY N/A 05/2006   Fusion C3-C7   GLAUCOMA SURGERY  2003   INGUINAL HERNIA REPAIR Right 2007   TUMOR REMOVAL Right  1977   Thoracotomy with RUL pulmonary mass resection     Social History   Tobacco Use   Smoking status: Former    Packs/day: 1.00    Years: 25.00    Pack years: 25.00    Types: Cigarettes    Quit date: 1979    Years since quitting: 43.8   Smokeless tobacco: Never  Vaping Use   Vaping Use: Never used  Substance Use Topics   Alcohol use: Not Currently    Comment: occasionally   Drug use: No     Family History  Problem Relation Age of Onset   Stroke Mother    Stroke Father    Cancer Brother     Allergies  Allergen Reactions   Shellfish-Derived Products Nausea Only    Scallops    REVIEW OF SYSTEMS (Negative unless checked)   Constitutional: [] Weight loss  [] Fever  [] Chills Cardiac: [] Chest pain   [] Chest pressure   [x] Palpitations   [] Shortness of breath when laying flat   [] Shortness of breath at rest   [x] Shortness of breath with exertion. Vascular:  [] Pain in legs with walking   [] Pain in legs at rest   [] Pain in legs when laying flat   [] Claudication   [] Pain in feet when walking  [] Pain in feet at rest  [] Pain in feet when laying flat   [] History of DVT   [] Phlebitis   [] Swelling in legs   [] Varicose veins   [] Non-healing ulcers Pulmonary:   [] Uses home oxygen   [] Productive cough   [] Hemoptysis   [] Wheeze  [] COPD   [] Asthma Neurologic:  [] Dizziness  [] Blackouts   [] Seizures   [x] History of stroke   [] History of TIA  [] Aphasia   [] Temporary blindness   [] Dysphagia   [] Weakness or numbness in arms   [] Weakness or numbness in legs Musculoskeletal:  [x] Arthritis   [] Joint swelling   [] Joint pain   [x] Low back pain Hematologic:  [] Easy bruising  [] Easy bleeding   [] Hypercoagulable state   [] Anemic   Gastrointestinal:  [] Blood in stool   [] Vomiting blood  [] Gastroesophageal reflux/heartburn   [] Abdominal pain Genitourinary:  [] Chronic kidney disease   [] Difficult urination  [] Frequent urination  [] Burning with urination   [] Hematuria Skin:  [] Rashes   [] Ulcers    [] Wounds Psychological:  [] History of anxiety   []  History of major depression.   Physical Examination  Vitals:  12/25/20 0724  BP: (!) 175/81  Pulse: (!) 57  Resp: 18  Temp: (!) 97.5 F (36.4 C)  TempSrc: Oral  SpO2: 99%  Weight: 68.5 kg  Height: 5\' 9"  (1.753 m)   Body mass index is 22.3 kg/m. Gen: WD/WN, NAD Head: [place]/AT, No temporalis wasting.  Ear/Nose/Throat: Hearing grossly intact, nares w/o erythema or drainage, oropharynx w/o Erythema/Exudate,  Eyes: Conjunctiva clear, sclera non-icteric Neck: Trachea midline.  No JVD.  Pulmonary:  Good air movement, respirations not labored, no use of accessory muscles.  Cardiac: irregular Vascular:  Vessel Right Left  Radial Palpable Palpable                          PT Palpable Palpable  DP Palpable Palpable   Gastrointestinal: soft, non-tender/non-distended. No guarding/reflex. Increased aortic impulse Musculoskeletal: M/S 5/5 throughout.  Extremities without ischemic changes.  No deformity or atrophy.  Neurologic: Sensation grossly intact in extremities.  Symmetrical.  Speech is fluent. Motor exam as listed above. Psychiatric: Judgment intact, Mood & affect appropriate for pt's clinical situation. Dermatologic: No rashes or ulcers noted.  No cellulitis or open wounds.      CBC Lab Results  Component Value Date   WBC 8.1 12/24/2020   HGB 15.7 12/24/2020   HCT 48.4 12/24/2020   MCV 88.6 12/24/2020   PLT 203 12/24/2020    BMET    Component Value Date/Time   NA 139 12/24/2020 1101   K 4.1 12/24/2020 1101   CL 105 12/24/2020 1101   CO2 26 12/24/2020 1101   GLUCOSE 87 12/24/2020 1101   BUN 25 (H) 12/24/2020 1101   CREATININE 1.08 12/24/2020 1101   CALCIUM 9.7 12/24/2020 1101   GFRNONAA >60 12/24/2020 1101   GFRAA 50 (L) 01/28/2019 2127   Estimated Creatinine Clearance: 42.3 mL/min (by C-G formula based on SCr of 1.08 mg/dL).  COAG Lab Results  Component Value Date   INR 1.1 12/24/2020   INR 1.0  04/15/2020   INR 0.99 02/01/2017    Radiology No results found.   Assessment/Plan Essential hypertension, benign blood pressure control important in reducing the progression of atherosclerotic disease and aneurysmal degeneration. On appropriate oral medications.     Stroke [facility]) Remote, no recent symptoms.   Hyperlipidemia lipid control important in reducing the progression of atherosclerotic disease. Continue statin therapy   AAA (abdominal aortic aneurysm) without rupture ([place]) I have independently reviewed his CT angiogram of the abdomen and pelvis.  His aneurysm has had continued growth and now measures about 5.4 to 5.5 cm in maximal diameter.  He has a long infrarenal neck with the aneurysm largely in the distal aorta.  He reports no clear symptoms of the aneurysm, but with persistent growth and now measuring roughly 5-1/2 cm, particularly given his favorable anatomy for endovascular repair with a long aortic neck that may even allow doing this without general anesthesia, I think repair is now a reasonable option.  Risks and benefits were discussed with he and his family today and he is agreeable to proceed.   Leotis Pain, MD  12/25/2020 8:18 AM

## 2020-12-25 NOTE — Transfer of Care (Signed)
Immediate Anesthesia Transfer of Care Note  Patient: Steve Bryant  Procedure(s) Performed: ENDOVASCULAR REPAIR/STENT GRAFT  Patient Location: PACU  Anesthesia Type:General  Level of Consciousness: drowsy  Airway & Oxygen Therapy: Patient Spontanous Breathing and Patient connected to face mask oxygen  Post-op Assessment: Report given to RN and Post -op Vital signs reviewed and stable  Post vital signs: stable  Last Vitals:  Vitals Value Taken Time  BP 189/70 12/25/20 1100  Temp 36.2 C 12/25/20 1054  Pulse 58 12/25/20 1106  Resp 11 12/25/20 1106  SpO2 98 % 12/25/20 1106  Vitals shown include unvalidated device data.  Last Pain:  Vitals:   12/25/20 1054  TempSrc:   PainSc: 0-No pain         Complications: No notable events documented.

## 2020-12-25 NOTE — Anesthesia Preprocedure Evaluation (Signed)
Anesthesia Evaluation  Patient identified by MRN, date of birth, ID band Patient awake    Reviewed: Allergy & Precautions, NPO status , Patient's Chart, lab work & pertinent test results  History of Anesthesia Complications Negative for: history of anesthetic complications  Airway Mallampati: II  TM Distance: <3 FB     Dental  (+) Implants   Pulmonary asthma , sleep apnea , former smoker,    breath sounds clear to auscultation- rhonchi (-) wheezing      Cardiovascular hypertension, Pt. on medications + Peripheral Vascular Disease and +CHF  (-) CAD, (-) Past MI, (-) Cardiac Stents and (-) CABG  Rhythm:Regular Rate:Normal - Systolic murmurs and - Diastolic murmurs Echo 09/14/27: 1. Left ventricular ejection fraction, by estimation, is 60 to 65%. The  left ventricle has normal function. Left ventricular endocardial border  not optimally defined to evaluate regional wall motion. Left ventricular  diastolic parameters are consistent  with Grade I diastolic dysfunction (impaired relaxation). Elevated left  atrial pressure.  2. Right ventricular systolic function is normal. The right ventricular  size is normal. Tricuspid regurgitation signal is inadequate for assessing  PA pressure.  3. The mitral valve is grossly normal. Trivial mitral valve  regurgitation. No evidence of mitral stenosis.  4. The aortic valve has an indeterminant number of cusps. There is  moderate calcification of the aortic valve. There is moderate thickening  of the aortic valve. Aortic valve regurgitation is mild. Mild to moderate  aortic valve sclerosis/calcification is  present, without any evidence of aortic stenosis.  5. Aortic dilatation noted. There is mild dilatation of the aortic root.    Neuro/Psych neg Seizures TIACVA negative psych ROS   GI/Hepatic Neg liver ROS, GERD  ,  Endo/Other  negative endocrine ROS  Renal/GU Renal  InsufficiencyRenal disease     Musculoskeletal  (+) Arthritis ,   Abdominal (+) - obese,   Peds  Hematology negative hematology ROS (+)   Anesthesia Other Findings Past Medical History: No date: AAA (abdominal aortic aneurysm)     Comment:  a.) Abd [place] 12/22/16: measured 4.5 cm. b.) CTA 12/27/18:               fusiform infrarenal AAA measured 4.7 cm. c.) Abd [place]               07/04/19: measured 5.0 cm. d.) CTA 11/03/19: measured 4.9              cm. e.) CTA 10/21/20: interval increase in size to 5.4 x               5.0 cm extending to the iliac bifurcation. 10/16/2016: Acute ischemic stroke ([place])     Comment:  a.) MRI --> 13 x 14 mm ischemic, non-hemorrhagic, RIGHT               paramedian ventral pons territory No date: Aortic atherosclerosis (HCC) No date: Arthritis No date: Bilateral renal cysts No date: Bradycardia No date: Carotid artery disease ([place])     Comment:  a.) Doppler 07/02/09: RIGHT 50-75%, LEFT < 50%. b.) CTA               07/04/09: RIGHT 90%. LEFT 50-75%. c.) CTA 10/15/2016:               RIGHT with no significant stenosis, LEFT 60%. No date: CHF (congestive heart failure) (HCC) No date: Chronic anticoagulation     Comment:  a.) DAPT therapy (ASA + clopidogrel) No date: Coronary  artery disease No date: DDD (degenerative disc disease), lumbar 33/29/5188: Diastolic dysfunction     Comment:  a.) TTE 04/16/20: EF 60-65%; G1DD. No date: DOE (dyspnea on exertion) No date: Dyspnea No date: GERD (gastroesophageal reflux disease) No date: Glaucoma No date: Hyperlipidemia No date: Hypertension No date: Legally blind No date: Neck mass     Comment:  a.) CT 07/04/09: 4.9 cm supraclavicular mass just               posterior to clavicle; PET CT 07/05/09 showed mass to be               FDG avid with max SUV of 6. b.) CT 10/15/16: measured 4 x              5 cm. c.) CT 04/15/20: measured 5.1 x 3.7 cm. No date: Pneumonia 06/03/2006: Porcelain gallbladder No date: PVD  (peripheral vascular disease) ([place]) 06/15/2019: Squamous cell carcinoma of skin     Comment:  left distal medial pretibial. WD SCC. EDC 06/15/2019: Squamous cell carcinoma of skin     Comment:  Right calf. KA-type. EDC 11/13/2020: Squamous cell carcinoma of skin     Comment:  Right distal hand (in situ) - EDC 11/13/2020: Squamous cell carcinoma of skin     Comment:  Right prox hand (in situ) - EDC 04/15/2020: TIA (transient ischemic attack) No date: Valvular regurgitation     Comment:  a.) TTE 10/17/2016 : EF 55-60%; mild AR/MR. b.) TTE               04/16/2020: EF 60-65%; G1DD, mild AR, triv. MR. c.) TTE               12/09/20: EF >55%; mild AR/TR, triv. PR, mod. MR.   Reproductive/Obstetrics                             Anesthesia Physical Anesthesia Plan  ASA: 3  Anesthesia Plan: General   Post-op Pain Management:    Induction: Intravenous  PONV Risk Score and Plan: 1 and Ondansetron and Treatment may vary due to age or medical condition  Airway Management Planned: Oral ETT  Additional Equipment: Arterial line  Intra-op Plan:   Post-operative Plan: Extubation in OR  Informed Consent: I have reviewed the patients History and Physical, chart, labs and discussed the procedure including the risks, benefits and alternatives for the proposed anesthesia with the patient or authorized representative who has indicated his/her understanding and acceptance.     Dental advisory given  Plan Discussed with: CRNA and Anesthesiologist  Anesthesia Plan Comments:         Anesthesia Quick Evaluation

## 2020-12-25 NOTE — Anesthesia Procedure Notes (Signed)
Procedure Name: Intubation Date/Time: 12/25/2020 8:46 AM Performed by: Jerrye Noble, CRNA Pre-anesthesia Checklist: Patient identified, Emergency Drugs available, Suction available and Patient being monitored Patient Re-evaluated:Patient Re-evaluated prior to induction Oxygen Delivery Method: Circle system utilized Preoxygenation: Pre-oxygenation with 100% oxygen Induction Type: IV induction Ventilation: Oral airway inserted - appropriate to patient size and Mask ventilation without difficulty Laryngoscope Size: McGraph and 3 Grade View: Grade I Tube type: Oral Number of attempts: 1 Airway Equipment and Method: Stylet and Oral airway Placement Confirmation: ETT inserted through vocal cords under direct vision, positive ETCO2 and breath sounds checked- equal and bilateral Secured at: 22 cm Tube secured with: Tape Dental Injury: Teeth and Oropharynx as per pre-operative assessment  Comments: Placed by Dr. Randa Lynn

## 2020-12-25 NOTE — Op Note (Signed)
OPERATIVE NOTE   PROCEDURE: US guidance for vascular access, bilateral femoral arteries Catheter placement into aorta from bilateral femoral approaches Placement of a 28 mm proximal, 14 mm distal conformable Gore Excluder Endoprosthesis main body right with a 12 mm x 10 cm left contralateral limb Placement of a 12 mm x 7 cm right iliac extension limb ProGlide closure devices bilateral femoral arteries  PRE-OPERATIVE DIAGNOSIS: AAA  POST-OPERATIVE DIAGNOSIS: same  SURGEON: Leotis Pain, MD   ASSISTANT: Elmore Guise, MD  ANESTHESIA: general  ESTIMATED BLOOD LOSS: 50 cc  FINDING(S): 1.  AAA  SPECIMEN(S):  none  INDICATIONS:   Steve Bryant is a 87 y.o. male who presents with a >5 cm AAA. The anatomy was suitable for endovascular repair.  Risks and benefits of repair in an endovascular fashion were discussed and informed consent was obtained. An assistant was present during the procedure to help facilitate the exposure and expedite the procedure.   DESCRIPTION: After obtaining full informed written consent, the patient was brought back to the operating room and placed supine upon the operating table.  The patient received IV antibiotics prior to induction.  After obtaining adequate anesthesia, the patient was prepped and draped in the standard fashion for endovascular AAA repair.  We then began by gaining access to both femoral arteries with US guidance.  The femoral arteries were found to be patent and accessed without difficulty with a needle under ultrasound guidance without difficulty on each side and permanent images were recorded.  We then placed 2 proglide devices on each side in a pre-close fashion and placed 8 French sheaths. The patient was then given 5000 units of intravenous heparin. The Pigtail catheter was placed into the aorta from the left side. Using this image, we selected a 28 mm conformable Main body device.  Over a stiff wire, an 97 French sheath was placed up  the right. The main body was then placed through the 18 French sheath. A Kumpe catheter was placed up the left side and a magnified image at the renal arteries was performed. The main body was then deployed just below the lowest renal artery which was the right. The Kumpe catheter was used to cannulate the contralateral gate without difficulty and successful cannulation was confirmed by twirling the pigtail catheter in the main body. We then placed a stiff wire and a retrograde arteriogram was performed through the left femoral sheath. We upsized to the 12 [place] sheath for the contralateral limb and a 12 mm x 10 cm limb was selected and deployed. The main body deployment was then completed. Based off the angiographic findings, extension limbs were necessary.  A 12 mm x 7 cm right iliac extension limb was selected and deployed on the right side . All junction points and seals zones were treated with the compliant balloon. Due to constraint in the narrowed area in the mid aorta as well as the iliacs, 10 mm non-complaint balloons bilaterally. This resulted in no significant residual stenosis. The pigtail catheter was then replaced and a completion angiogram was performed.  A small type II Endoleak was detected on completion angiography. The renal arteries were found to be open although there was native disease in both vessels. I did not treat this as his Cr was normal and he was only on one BP meds. At this point we elected to terminate the procedure. We secured the pro glide devices for hemostasis on the femoral arteries. The skin incision was closed with a  4-0 Monocryl. Dermabond and pressure dressing were placed. The patient was taken to the recovery room in stable condition having tolerated the procedure well.  COMPLICATIONS: none  CONDITION: stable  Leotis Pain  12/25/2020, 10:48 AM   This note was created with Dragon Medical transcription system. Any errors in dictation are purely unintentional.

## 2020-12-25 NOTE — Progress Notes (Signed)
Upon getting report pt was attempting to get out of bed without assistance. When dayshift RN and nightshift RN went in the room to help, Pt got verbally aggressive and very agitated, he was very confused not recognizing he was in the hospital and that he had surgery. He refused his physical assessment refusing anyone to touch him, and hit staff later after attempting to get up again. Will continue to monitor pt.

## 2020-12-26 ENCOUNTER — Encounter: Payer: Self-pay | Admitting: Vascular Surgery

## 2020-12-26 DIAGNOSIS — I714 Abdominal aortic aneurysm, without rupture, unspecified: Secondary | ICD-10-CM

## 2020-12-26 LAB — BASIC METABOLIC PANEL
Anion gap: 10 (ref 5–15)
BUN: 21 mg/dL (ref 8–23)
CO2: 23 mmol/L (ref 22–32)
Calcium: 9.1 mg/dL (ref 8.9–10.3)
Chloride: 107 mmol/L (ref 98–111)
Creatinine, Ser: 1.05 mg/dL (ref 0.61–1.24)
GFR, Estimated: >60 mL/min (ref >60)
Glucose, Bld: 132 mg/dL — ABNORMAL HIGH (ref 70–99)
Potassium: 4.5 mmol/L (ref 3.5–5.1)
Sodium: 140 mmol/L (ref 135–145)

## 2020-12-26 LAB — CBC
HCT: 42.2 % (ref 39.0–52.0)
Hemoglobin: 14 g/dL (ref 13.0–17.0)
MCH: 29.7 pg (ref 26.0–34.0)
MCHC: 33.2 g/dL (ref 30.0–36.0)
MCV: 89.6 fL (ref 80.0–100.0)
Platelets: 150 10*3/uL (ref 150–400)
RBC: 4.71 MIL/uL (ref 4.22–5.81)
RDW: 14.7 % (ref 11.5–15.5)
WBC: 18.2 10*3/uL — ABNORMAL HIGH (ref 4.0–10.5)
nRBC: 0 % (ref 0.0–0.2)

## 2020-12-26 MED ORDER — HALOPERIDOL LACTATE 5 MG/ML IJ SOLN
INTRAMUSCULAR | Status: AC
  Administered 2020-12-26: 5 mg via INTRAVENOUS
  Filled 2020-12-26: qty 1

## 2020-12-26 MED ORDER — HALOPERIDOL LACTATE 5 MG/ML IJ SOLN: 5.0000 mg | Freq: Four times a day (QID) | INTRAMUSCULAR | Status: DC | PRN

## 2020-12-26 NOTE — Anesthesia Postprocedure Evaluation (Signed)
Anesthesia Post Note  Patient: Steve Bryant  Procedure(s) Performed: ENDOVASCULAR REPAIR/STENT GRAFT  Patient location during evaluation: SICU Anesthesia Type: General Level of consciousness: confused Pain management: pain level controlled Vital Signs Assessment: post-procedure vital signs reviewed and stable Respiratory status: spontaneous breathing Cardiovascular status: stable Postop Assessment: no apparent nausea or vomiting Anesthetic complications: no   No notable events documented.   Last Vitals:  Vitals:   12/26/20 0600 12/26/20 0630  BP: (!) 142/123 125/66  Pulse: 91 79  Resp:    Temp:    SpO2: 100% 97%    Last Pain:  Vitals:   12/26/20 0400  TempSrc: Axillary  PainSc: [place]

## 2020-12-26 NOTE — Progress Notes (Signed)
Patient alert and oriented with mild confusion. Patient in NSR, with some bradycardia. Blood pressures stable with systolic below 300. PAD deflated with minimal bruising and no signs of bleeding. Patient ambulated on unit. Discharge/Medication instructions provided to patient and family at bedside. Belongings returned to patient.

## 2020-12-26 NOTE — Progress Notes (Signed)
Pt continued to be very agitated and confused, Dr.Dew was notified, 50 mg of benadryl was given at 2224, and was effective for a couple hours, at 0100 pt became agitated again and 5 mg of haldol was given, pt remained unchanged. Pt now remains calm, with some impulsiveness and still confused, only alert to self. He states he is tired and will only sleep for an hour or two, wakes up and continues to try getting of bed.

## 2020-12-26 NOTE — Discharge Instructions (Signed)
Vascular Surgery Discharge Instructions:  1) You may shower as of tomorrow.  Please keep your groins clean and dry.  Gently clean your groin with soap and water.  Gently pat dry. 2) Please do not engage in strenuous activity or lifting greater than 10 pounds until cleared at your first post procedure follow-up. 3) Please do not drive for at least 2 weeks.

## 2020-12-26 NOTE — Discharge Summary (Signed)
[facility]    Discharge Summary  Patient ID:  Steve Bryant MRN: [id number] DOB/AGE: [birth year] 87 y.o.  Admit date: 12/25/2020 Discharge date: 12/26/2020 Date of Surgery: 12/25/2020 Surgeon: Surgeon(s): Lucky Cowboy Erskine Squibb, MD  Admission Diagnosis: AAA (abdominal aortic aneurysm) without rupture [I71.40]  Discharge Diagnoses:  AAA (abdominal aortic aneurysm) without rupture [I71.40]  Secondary Diagnoses: Past Medical History:  Diagnosis Date   AAA (abdominal aortic aneurysm)    a.) Abd [place] 12/22/16: measured 4.5 cm. b.) CTA 12/27/18: fusiform infrarenal AAA measured 4.7 cm. c.) Abd [place] 07/04/19: measured 5.0 cm. d.) CTA 11/03/19: measured 4.9 cm. e.) CTA 10/21/20: interval increase in size to 5.4 x 5.0 cm extending to the iliac bifurcation.   Acute ischemic stroke ([place]) 10/16/2016   a.) MRI --> 13 x 14 mm ischemic, non-hemorrhagic, RIGHT paramedian ventral pons territory   Aortic atherosclerosis (HCC)    Arthritis    Bilateral renal cysts    Bradycardia    Carotid artery disease ([place])    a.) Doppler 07/02/09: RIGHT 50-75%, LEFT < 50%. b.) CTA 07/04/09: RIGHT 90%. LEFT 50-75%. c.) CTA 10/15/2016: RIGHT with no significant stenosis, LEFT 60%.   CHF (congestive heart failure) (HCC)    Chronic anticoagulation    a.) DAPT therapy (ASA + clopidogrel)   Coronary artery disease    DDD (degenerative disc disease), lumbar    Diastolic dysfunction 62/22/9798   a.) TTE 04/16/20: EF 60-65%; G1DD.   DOE (dyspnea on exertion)    Dyspnea    GERD (gastroesophageal reflux disease)    Glaucoma    Hyperlipidemia    Hypertension    Legally blind    Neck mass    a.) CT 07/04/09: 4.9 cm supraclavicular mass just posterior to clavicle; PET CT 07/05/09 showed mass to be FDG avid with max SUV of 6. b.) CT 10/15/16: measured 4 x 5 cm. c.) CT 04/15/20: measured 5.1 x 3.7 cm.   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 WD SCC. EDC   Squamous cell carcinoma of skin 06/15/2019   Right calf. KA-type. EDC   Squamous cell carcinoma of skin 11/13/2020   Right distal hand (in situ) - EDC   Squamous cell carcinoma of skin 11/13/2020   Right prox hand (in situ) - EDC   TIA (transient ischemic attack) 04/15/2020   Valvular regurgitation    a.) TTE 10/17/2016 : EF 55-60%; mild AR/MR. b.) TTE 04/16/2020: EF 60-65%; G1DD, mild AR, triv. MR. c.) TTE 12/09/20: EF >55%; mild AR/TR, triv. PR, mod. MR.   Procedure(s): 12/25/20: US guidance for vascular access, bilateral femoral arteries Catheter placement into aorta from bilateral femoral approaches Placement of a 28 mm proximal, 14 mm distal conformable Gore Excluder Endoprosthesis main body right with a 12 mm x 10 cm left contralateral limb Placement of a 12 mm x 7 cm right iliac extension limb ProGlide closure devices bilateral femoral arteries  Discharged Condition: Good  HPI / Hospital Course:  Steve Bryant is a 87 y.o. male who presents with a >5 cm AAA. The anatomy was suitable for endovascular repair.  Risks and benefits of repair in an endovascular fashion were discussed and informed consent was obtained. An assistant was present during the procedure to help facilitate the exposure and expedite the procedure. On 12/25/20, the patient underwent:  US guidance for vascular access, bilateral  femoral arteries Catheter placement into aorta from bilateral femoral approaches Placement of a 28 mm proximal, 14 mm distal conformable Gore Excluder Endoprosthesis main body right with a 12 mm x 10 cm left contralateral limb Placement of a 12 mm x 7 cm right iliac extension limb ProGlide closure devices bilateral femoral arteries  Physical Exam:  Alert and oriented x3, no acute distress Patient is blind Cardiovascular: Regular rate and rhythm Pulmonary: Clear to auscultation  bilaterally Abdomen: Soft, nontender, nondistended, positive bowel sounds Groin access site: Clean dry and intact.  No swelling ecchymosis or drainage noted. Extremity: Bilaterally, warm distally to toes.  Motor/sensory is intact.    Labs: As below  Complications: None  Consults: None  Significant Diagnostic Studies: CBC Lab Results  Component Value Date   WBC 18.2 (H) 12/26/2020   HGB 14.0 12/26/2020   HCT 42.2 12/26/2020   MCV 89.6 12/26/2020   PLT 150 12/26/2020   BMET    Component Value Date/Time   NA 140 12/26/2020 0511   K 4.5 12/26/2020 0511   CL 107 12/26/2020 0511   CO2 23 12/26/2020 0511   GLUCOSE 132 (H) 12/26/2020 0511   BUN 21 12/26/2020 0511   CREATININE 1.05 12/26/2020 0511   CALCIUM 9.1 12/26/2020 0511   GFRNONAA >60 12/26/2020 0511   GFRAA 50 (L) 01/28/2019 2127   COAG Lab Results  Component Value Date   INR 1.1 12/24/2020   INR 1.0 04/15/2020   INR 0.99 02/01/2017   Disposition:  Discharge to :Home  Allergies as of 12/26/2020       Reactions   Shellfish-derived Products Nausea Only   Scallops        Medication List     TAKE these medications    aspirin EC 81 MG tablet Take 81 mg by mouth daily.   clopidogrel 75 MG tablet Commonly known as: PLAVIX Take 75 mg by mouth daily.   dorzolamide 2 % ophthalmic solution Commonly known as: TRUSOPT Place 1 drop into the right eye daily with lunch.   dorzolamide-timolol 22.3-6.8 MG/ML ophthalmic solution Commonly known as: COSOPT Place 1 drop into both eyes 2 (two) times daily.   gabapentin 100 MG capsule Commonly known as: NEURONTIN Take 100 mg by mouth 2 (two) times daily with a meal.   latanoprost 0.005 % ophthalmic solution Commonly known as: XALATAN Place 1 drop into the right eye at bedtime.   losartan 25 MG tablet Commonly known as: COZAAR Take 1 tablet (25 mg total) by mouth daily.   multivitamin tablet Take 1 tablet by mouth daily.   pantoprazole 20 MG  tablet Commonly known as: PROTONIX Take 20 mg by mouth daily.   pregabalin 75 MG capsule Commonly known as: LYRICA Take 75 mg by mouth 3 (three) times daily with meals.   QUEtiapine 25 MG tablet Commonly known as: SEROQUEL Take 25 mg by mouth See admin instructions. Take 25mg  with dinner and 25mg  at bedtime.   rosuvastatin 5 MG tablet Commonly known as: CRESTOR Take 5 mg by mouth daily with supper.       Verbal and written Discharge instructions given to the patient. Wound care per Discharge AVS  Follow-up Information     Dew, Erskine Squibb, MD Follow up in 1 month(s).   Specialties: Vascular Surgery, Radiology, Interventional Cardiology Why: Can see Dew or Arna Medici. Will need EVAR with visit. Contact information: [place] [address] [phone]  SignedSela Hua, PA-C 12/26/2020, 12:05 PM

## 2021-01-28 ENCOUNTER — Other Ambulatory Visit (INDEPENDENT_AMBULATORY_CARE_PROVIDER_SITE_OTHER): Payer: Medicare Other

## 2021-01-28 ENCOUNTER — Encounter (INDEPENDENT_AMBULATORY_CARE_PROVIDER_SITE_OTHER): Payer: Medicare Other | Admitting: Vascular Surgery

## 2021-02-03 ENCOUNTER — Other Ambulatory Visit (INDEPENDENT_AMBULATORY_CARE_PROVIDER_SITE_OTHER): Payer: Self-pay | Admitting: Vascular Surgery

## 2021-02-03 DIAGNOSIS — I714 Abdominal aortic aneurysm, without rupture, unspecified: Secondary | ICD-10-CM

## 2021-02-05 ENCOUNTER — Other Ambulatory Visit: Payer: Self-pay

## 2021-02-05 ENCOUNTER — Ambulatory Visit (INDEPENDENT_AMBULATORY_CARE_PROVIDER_SITE_OTHER): Payer: Medicare Other | Admitting: Nurse Practitioner

## 2021-02-05 ENCOUNTER — Encounter (INDEPENDENT_AMBULATORY_CARE_PROVIDER_SITE_OTHER): Payer: Self-pay | Admitting: Nurse Practitioner

## 2021-02-05 ENCOUNTER — Ambulatory Visit (INDEPENDENT_AMBULATORY_CARE_PROVIDER_SITE_OTHER): Payer: Medicare Other

## 2021-02-05 VITALS — BP 205/91 | HR 56 | Ht 69.0 in | Wt 155.0 lb

## 2021-02-05 DIAGNOSIS — I714 Abdominal aortic aneurysm, without rupture, unspecified: Secondary | ICD-10-CM | POA: Diagnosis not present

## 2021-02-05 DIAGNOSIS — E785 Hyperlipidemia, unspecified: Secondary | ICD-10-CM

## 2021-02-05 DIAGNOSIS — I10 Essential (primary) hypertension: Secondary | ICD-10-CM

## 2021-02-07 ENCOUNTER — Encounter (INDEPENDENT_AMBULATORY_CARE_PROVIDER_SITE_OTHER): Payer: Self-pay

## 2021-02-10 ENCOUNTER — Encounter (INDEPENDENT_AMBULATORY_CARE_PROVIDER_SITE_OTHER): Payer: Self-pay | Admitting: Nurse Practitioner

## 2021-02-10 NOTE — Progress Notes (Signed)
Subjective:    Patient ID: Steve Bryant, male    DOB: 1925-12-16, 87 y.o.   MRN: [id number] Chief Complaint  Patient presents with   Follow-up    1 mo post op with evar     Steve Bryant is a 87 year old male that returns to the office for surveillance of an abdominal aortic aneurysm status post stent graft placement on 12/25/2020.   Patient denies abdominal pain or back pain, no other abdominal complaints. No groin related complaints. No symptoms consistent with distal embolization No changes in claudication distance.   There have been no interval changes in his overall healthcare since his last visit.   Patient denies amaurosis fugax or TIA symptoms. There is no history of claudication or rest pain symptoms of the lower extremities. The patient denies angina or shortness of breath.   Duplex US of the aorta and iliac arteries shows a 4.8 cm AAA sac with no endoleak, decrease in the sac compared to the previous study.    Review of Systems  Cardiovascular:  Negative for leg swelling.  All other systems reviewed and are negative.     Objective:   Physical Exam Vitals reviewed.  HENT:     Head: Normocephalic.  Cardiovascular:     Rate and Rhythm: Normal rate and regular rhythm.     Pulses: Normal pulses.  Pulmonary:     Effort: Pulmonary effort is normal.  Skin:    General: Skin is warm and dry.     Capillary Refill: Capillary refill takes less than 2 seconds.  Neurological:     Mental Status: He is alert and oriented to person, place, and time.  Psychiatric:        Mood and Affect: Mood normal.        Behavior: Behavior normal.        Thought Content: Thought content normal.        Judgment: Judgment normal.    BP (!) 205/91   Pulse (!) 56   Ht 5\' 9"  (1.753 m)   Wt 155 lb (70.3 kg)   BMI 22.89 kg/m   Past Medical History:  Diagnosis Date   AAA (abdominal aortic aneurysm)    a.) Abd [place] 12/22/16: measured 4.5 cm. b.) CTA 12/27/18: fusiform infrarenal AAA  measured 4.7 cm. c.) Abd [place] 07/04/19: measured 5.0 cm. d.) CTA 11/03/19: measured 4.9 cm. e.) CTA 10/21/20: interval increase in size to 5.4 x 5.0 cm extending to the iliac bifurcation.   Acute ischemic stroke ([place]) 10/16/2016   a.) MRI --> 13 x 14 mm ischemic, non-hemorrhagic, RIGHT paramedian ventral pons territory   Aortic atherosclerosis (HCC)    Arthritis    Bilateral renal cysts    Bradycardia    Carotid artery disease ([place])    a.) Doppler 07/02/09: RIGHT 50-75%, LEFT < 50%. b.) CTA 07/04/09: RIGHT 90%. LEFT 50-75%. c.) CTA 10/15/2016: RIGHT with no significant stenosis, LEFT 60%.   CHF (congestive heart failure) (HCC)    Chronic anticoagulation    a.) DAPT therapy (ASA + clopidogrel)   Coronary artery disease    DDD (degenerative disc disease), lumbar    Diastolic dysfunction 70/03/7492   a.) TTE 04/16/20: EF 60-65%; G1DD.   DOE (dyspnea on exertion)    Dyspnea    GERD (gastroesophageal reflux disease)    Glaucoma    Hyperlipidemia    Hypertension    Legally blind    Neck mass    a.) CT 07/04/09: 4.9 cm supraclavicular mass  just posterior to clavicle; PET CT 07/05/09 showed mass to be FDG avid with max SUV of 6. b.) CT 10/15/16: measured 4 x 5 cm. c.) CT 04/15/20: measured 5.1 x 3.7 cm.   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 WD SCC. EDC   Squamous cell carcinoma of skin 06/15/2019   Right calf. KA-type. EDC   Squamous cell carcinoma of skin 11/13/2020   Right distal hand (in situ) - EDC   Squamous cell carcinoma of skin 11/13/2020   Right prox hand (in situ) - EDC   TIA (transient ischemic attack) 04/15/2020   Valvular regurgitation    a.) TTE 10/17/2016 : EF 55-60%; mild AR/MR. b.) TTE 04/16/2020: EF 60-65%; G1DD, mild AR, triv. MR. c.) TTE 12/09/20: EF >55%; mild AR/TR, triv. PR, mod. MR.    Social History   Socioeconomic History    Marital status: Widowed    Spouse name: Not on file   Number of children: Not on file   Years of education: Not on file   Highest education level: Not on file  Occupational History   Not on file  Tobacco Use   Smoking status: Former    Packs/day: 1.00    Years: 25.00    Pack years: 25.00    Types: Cigarettes    Quit date: 29    Years since quitting: 43.9   Smokeless tobacco: Never  Vaping Use   Vaping Use: Never used  Substance and Sexual Activity   Alcohol use: Not Currently    Comment: occasionally   Drug use: No   Sexual activity: Not on file  Other Topics Concern   Not on file  Social History Narrative   Lives alone, has care takers    Social Determinants of Health   Financial Resource Strain: Not on file  Food Insecurity: Not on file  Transportation Needs: Not on file  Physical Activity: Not on file  Stress: Not on file  Social Connections: Not on file  Intimate Partner Violence: Not on file    Past Surgical History:  Procedure Laterality Date   CERVICAL LAMINECTOMY N/A 05/2006   Fusion C3-C7   ENDOVASCULAR REPAIR/STENT GRAFT N/A 12/25/2020   Procedure: ENDOVASCULAR REPAIR/STENT GRAFT;  Surgeon: Algernon Huxley, MD;  Location: [facility];  Service: Cardiovascular;  Laterality: N/A;   GLAUCOMA SURGERY  2003   INGUINAL HERNIA REPAIR Right 2007   TUMOR REMOVAL Right 1977   Thoracotomy with RUL pulmonary mass resection    Family History  Problem Relation Age of Onset   Stroke Mother    Stroke Father    Cancer Brother     Allergies  Allergen Reactions   Shellfish-Derived Products Nausea Only    Scallops    CBC Latest Ref Rng & Units 12/26/2020 12/24/2020 05/30/2020  WBC 4.0 - 10.5 K/uL 18.2(H) 8.1 9.0  Hemoglobin 13.0 - 17.0 g/dL 14.0 15.7 13.2  Hematocrit 39.0 - 52.0 % 42.2 48.4 40.4  Platelets 150 - 400 K/uL 150 203 166      CMP     Component Value Date/Time   NA 140 12/26/2020 0511   K 4.5 12/26/2020 0511   CL 107 12/26/2020  0511   CO2 23 12/26/2020 0511   GLUCOSE 132 (H) 12/26/2020 0511   BUN 21 12/26/2020 0511   CREATININE 1.05 12/26/2020 0511   CALCIUM 9.1 12/26/2020  0511   PROT 7.2 05/27/2020 2240   ALBUMIN 4.1 05/27/2020 2240   AST 27 05/27/2020 2240   ALT 16 05/27/2020 2240   ALKPHOS 69 05/27/2020 2240   BILITOT 1.5 (H) 05/27/2020 2240   GFRNONAA >60 12/26/2020 0511   GFRAA 50 (L) 01/28/2019 2127     No results found.     Assessment & Plan:   1. Abdominal aortic aneurysm (AAA) without rupture, unspecified part Recommend: Patient is status post successful endovascular repair of the AAA.   No further intervention is required at this time.   No endoleak is detected and the aneurysm sac is stable.  The patient will continue antiplatelet therapy as prescribed as well as aggressive management of hyperlipidemia. Exercise is again strongly encouraged.   However, endografts require continued surveillance with ultrasound or CT scan. This is mandatory to detect any changes that allow repressurization of the aneurysm sac.  The patient is informed that this would be asymptomatic.  The patient is reminded that lifelong routine surveillance is a necessity with an endograft. Patient will continue to follow-up at 6 months  with ultrasound of the aorta.   2. Essential hypertension, benign The patient does have elevated blood pressure today however due to his scheduled ultrasound today he is not taking his blood pressure medications.  He is advised to take medication as soon as he returns home and reevaluate blood pressure if remains elevated he should seek evaluation at urgent care or the emergency room.  No medication changes today.  3. Hyperlipidemia, unspecified hyperlipidemia type Continue statin as ordered and reviewed, no changes at this time    Current Outpatient Medications on File Prior to Visit  Medication Sig Dispense Refill   aspirin EC 81 MG tablet Take 81 mg by mouth daily.     clopidogrel  (PLAVIX) 75 MG tablet Take 75 mg by mouth daily.     dorzolamide (TRUSOPT) 2 % ophthalmic solution Place 1 drop into the right eye daily with lunch.     dorzolamide-timolol (COSOPT) 22.3-6.8 MG/ML ophthalmic solution Place 1 drop into both eyes 2 (two) times daily.  2   gabapentin (NEURONTIN) 100 MG capsule Take 100 mg by mouth 2 (two) times daily with a meal.     latanoprost (XALATAN) 0.005 % ophthalmic solution Place 1 drop into the right eye at bedtime.     losartan (COZAAR) 25 MG tablet Take 1 tablet (25 mg total) by mouth daily. 30 tablet 2   Multiple Vitamin (MULTIVITAMIN) tablet Take 1 tablet by mouth daily.     pantoprazole (PROTONIX) 20 MG tablet Take 20 mg by mouth daily.     pregabalin (LYRICA) 75 MG capsule Take 75 mg by mouth 3 (three) times daily with meals.     QUEtiapine (SEROQUEL) 25 MG tablet Take 25 mg by mouth See admin instructions. Take 25mg  with dinner and 25mg  at bedtime.     rosuvastatin (CRESTOR) 5 MG tablet Take 5 mg by mouth daily with supper.     No current facility-administered medications on file prior to visit.    There are no Patient Instructions on file for this visit. No follow-ups on file.   Kris Hartmann, NP

## 2021-03-12 ENCOUNTER — Other Ambulatory Visit: Payer: Self-pay

## 2021-03-12 ENCOUNTER — Encounter: Payer: Medicare Other | Admitting: Dermatology

## 2021-03-12 NOTE — Patient Instructions (Signed)

## 2021-03-25 NOTE — Progress Notes (Signed)
This encounter was created in error - please disregard.

## 2021-04-30 IMAGING — CT CT HEAD W/O CM
2 of 3 series · 11 of 47 positions shown, 13 images · non-contrast
Comparison: CT angiography of the head neck, MRI and CT head
04/15/2020

CLINICAL DATA: Fall with prolonged down time (6 hours)

EXAM:
CT HEAD WITHOUT CONTRAST
CT CERVICAL SPINE WITHOUT CONTRAST
TECHNIQUE: Multidetector CT imaging of the head and cervical spine was
performed following the standard protocol without intravenous
contrast. Multiplanar CT image reconstructions of the cervical spine
were also generated.

[Series 4: coronal soft tissue · coronal · 0.31mm/px · 3 of 77 slices shown]
[im 26/77  brain]
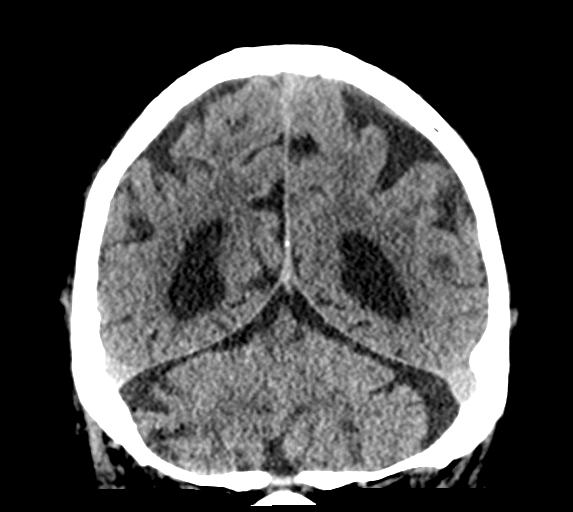
[im 34/77  brain]
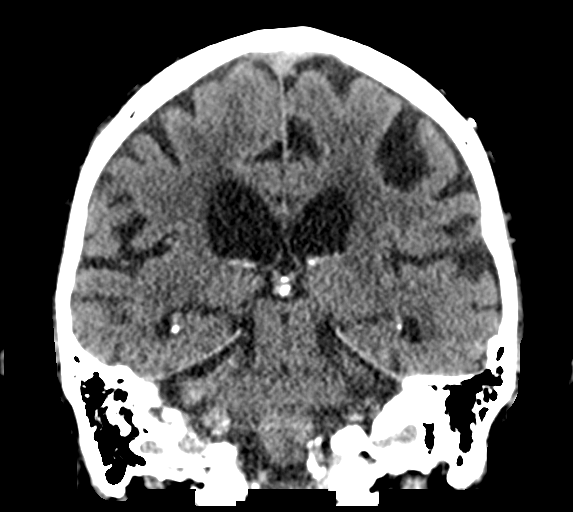
[im 43/77  brain]
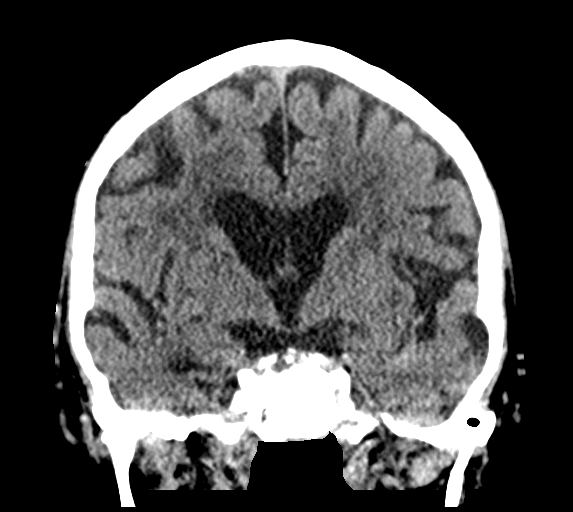

[Series 6: ax head wo · axial · 0.37mm/px · z∈[-126,-1]mm · 8 of 31 slices shown, 10 images]
[im 3/31  brain]
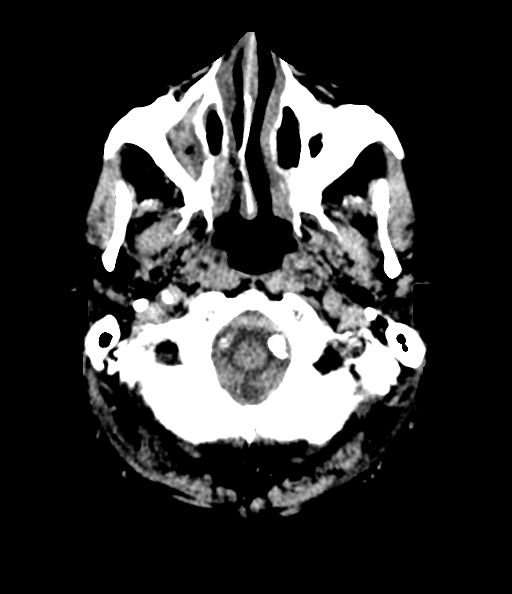
[im 3/31  bone]
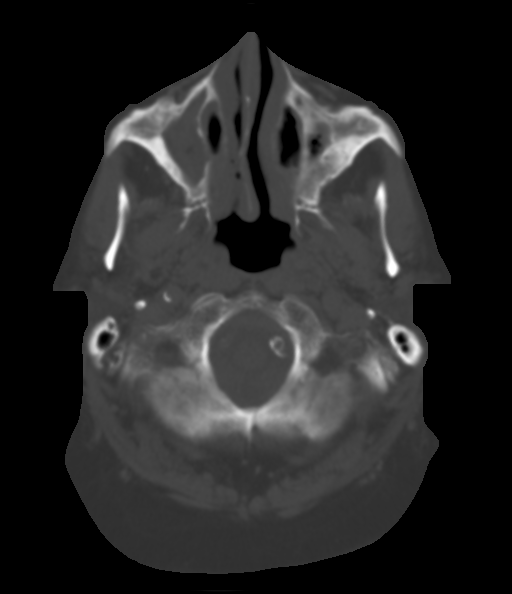
[im 7/31  brain]
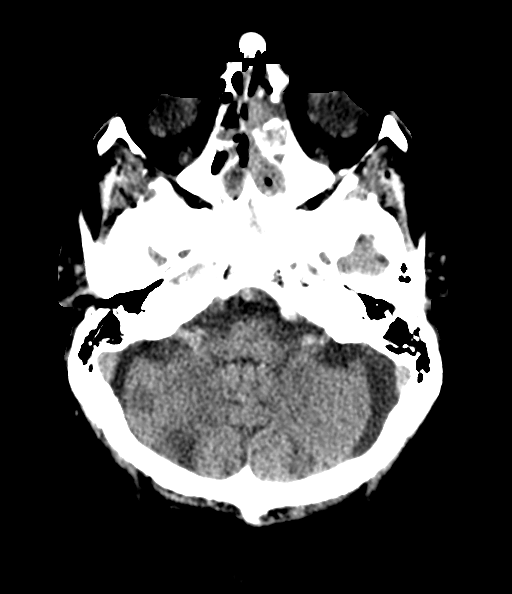
[im 10/31  brain]
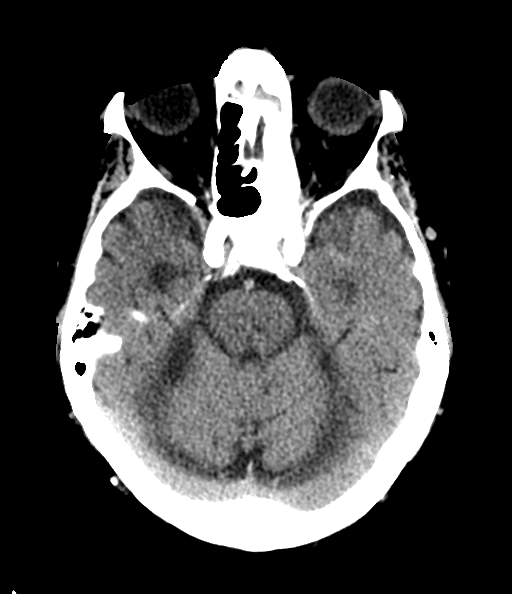
[im 14/31  brain]
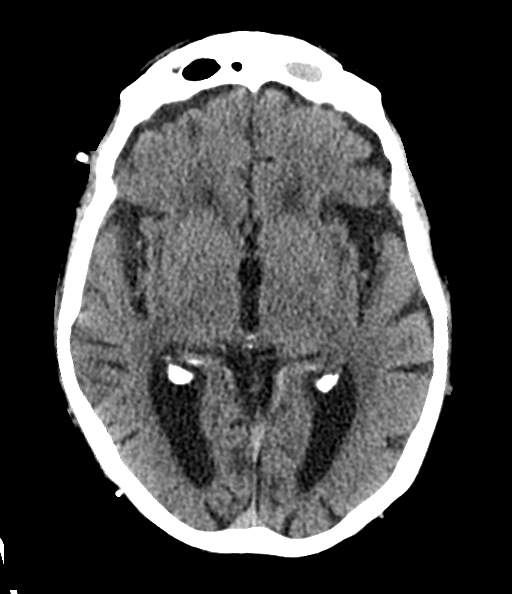
[im 17/31  brain]
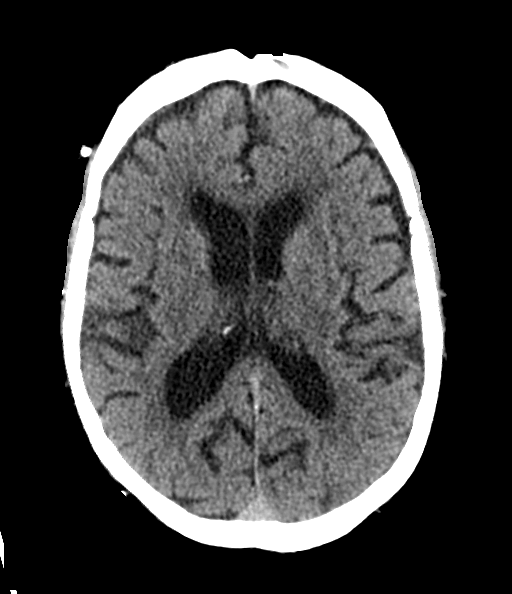
[im 17/31  bone]
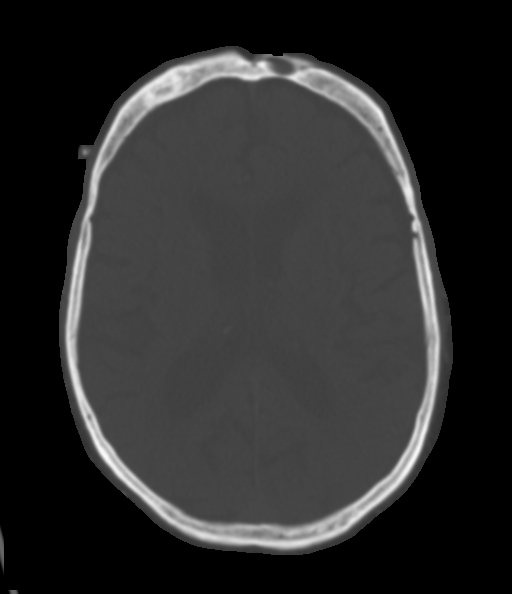
[im 21/31  brain]
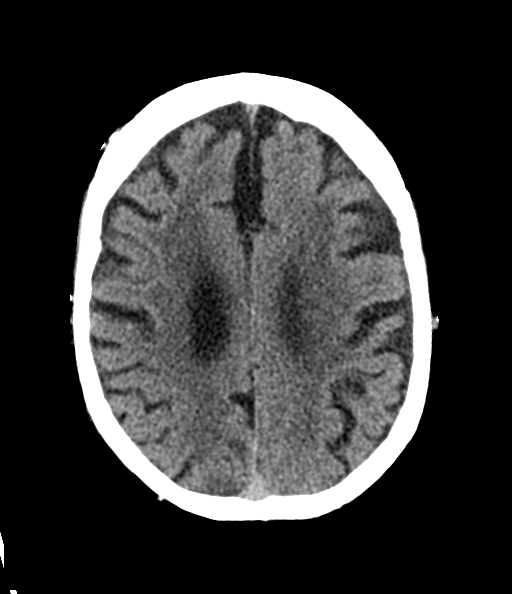
[im 24/31  brain]
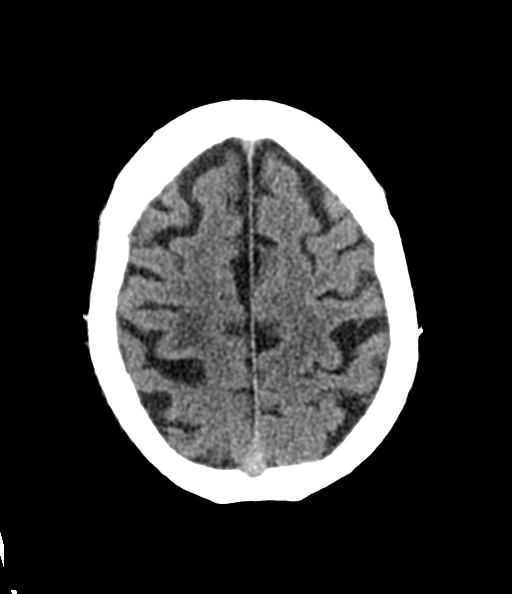
[im 28/31  brain]
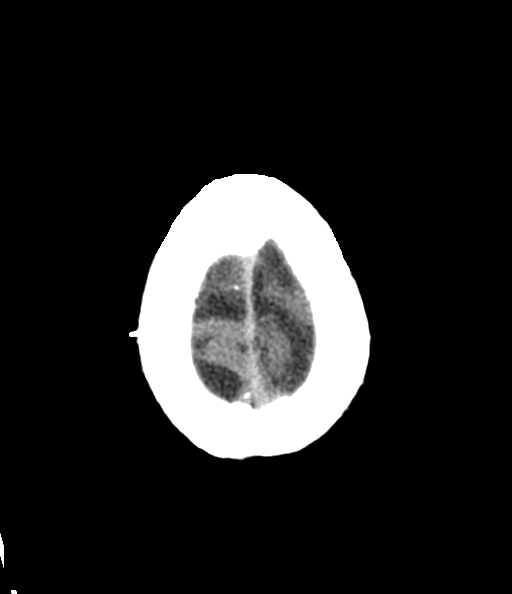

[11 of 47 positions shown; findings below may reference images not displayed]

FINDINGS: CT HEAD FINDINGS

Brain: Sequela of prior right parietal and bilateral cerebellar
infarcts as well as remote appearing lacunar type infarcts in the
bilateral basal ganglia, thalami and pons. No evidence of acute
infarction, hemorrhage, hydrocephalus, extra-axial collection,
visible mass lesion or mass effect. Diffuse prominence of the
ventricles, cisterns and sulci compatible with parenchymal volume
loss. Patchy areas of white matter hypoattenuation are most
compatible with chronic microvascular angiopathy.

Vascular: Atherosclerotic calcification of the carotid siphons and
intradural vertebral arteries. No hyperdense vessel.

Skull: No acute scalp swelling, calvarial fracture or new osseous
lesion. Expansile and hyperostotic changes of the frontal bone some
of which is possibly related to chronic sinus disease as detailed
below.

Sinuses/Orbits: Extensive opacification throughout the paranasal
sinuses, left greater than right with some or pneumatized secretions
in the nasal passages as well. Hyperostotic changes likely reflect
some chronicity. Orbital structures are unremarkable aside from
prior lens extractions.

Other: None.

CT CERVICAL SPINE FINDINGS

Alignment: Preservation of the normal cervical lordosis. Anterior
C3-C7 cervical fusion with bony incorporation of spacers from the
C4-C7 levels and complete disc height loss C3-4. No evidence of
traumatic listhesis. No abnormally widened, perched or jumped
facets. Normal alignment of the craniocervical and atlantoaxial
articulations.

Skull base and vertebrae: No acute skull base fracture. No vertebral
body fracture or height loss. Normal bone mineralization. No
worrisome osseous lesions. Fusion changes, as above. Spondylitic
changes C2-3 and about the dense and craniocervical junctions. Some
hypertrophic facet degenerative changes as well. Findings are better
detailed below.

Soft tissues and spinal canal: No pre or paravertebral fluid or
swelling. No visible canal hematoma. Airways patent.

Disc levels: Prior fusion C3-C7. Disc height loss and posterior
osseous ridging and disc osteophyte complexes C2-3, C3-4 result in
some partial effacement of the ventral thecal sac. Some additional
osseous ridging and mild canal narrowing noted diffusely C5-T1.
Multilevel uncinate spurring facet hypertrophic changes result in
mild-to-moderate multilevel neural foraminal narrowing which is most
pronounced at the C5-6 level bilaterally.

Upper chest: No acute abnormality in the upper chest or imaged lung
apices. Redemonstration of a hypoattenuating 5.1 cm soft tissue mass
at the level of the right lung apex/superior mediastinum, unchanged
in appearance from prior. Biapical pleuroparenchymal scarring.
Centrilobular emphysematous changes. Surgical clip seen at the
interval between the right T1 and T2 transverse processes, unchanged
from prior. Calcified pleural plaques. Proximal great vessel
atherosclerotic calcifications as well as cervical carotid
atherosclerosis.

Other: Included portion of the thyroid gland are unremarkable.
IMPRESSION: 1. No evidence of acute intracranial abnormality. No scalp swelling,
calvarial fracture.
2. Sequela of prior right parietal and bilateral cerebellar infarcts
as well as remote appearing lacunar type infarcts in the bilateral
basal ganglia, thalami and pons.
3. Extensive paranasal sinus disease, left greater than right, with
some or pneumatized secretions in the nasal passages as well.
Hyperostotic changes of the frontal bone and sinus perimeter.
Appearance possibly reflecting combination of acute on chronic
sinusitis, correlate with clinical symptoms.
4. No acute fracture or traumatic listhesis of the cervical spine.
5. Prior C3-C7 cervical fusion.
6. Multilevel degenerative changes of the cervical spine as
described above.
7. Redemonstration of a 5.1 cm hypoattenuating soft tissue mass at
the level of the right lung apex/superior mediastinum, unchanged in
appearance from prior. Correlate with patient history and prior
evaluation given that this was seen on comparison PET-CT 07/05/2009
demonstrating PET positivity at that time.
8. Emphysema ([id number]).

## 2021-04-30 IMAGING — CR DG FOREARM 2V*L*
3 series · 3 of 3 positions shown · non-contrast
Comparison: None.

CLINICAL DATA: [AGE] male with fall and trauma to the left
upper extremity.

EXAM:
LEFT FOREARM - 2 VIEW; LEFT HAND - COMPLETE 3+ VIEW

[forearm ap (1 of 2)]
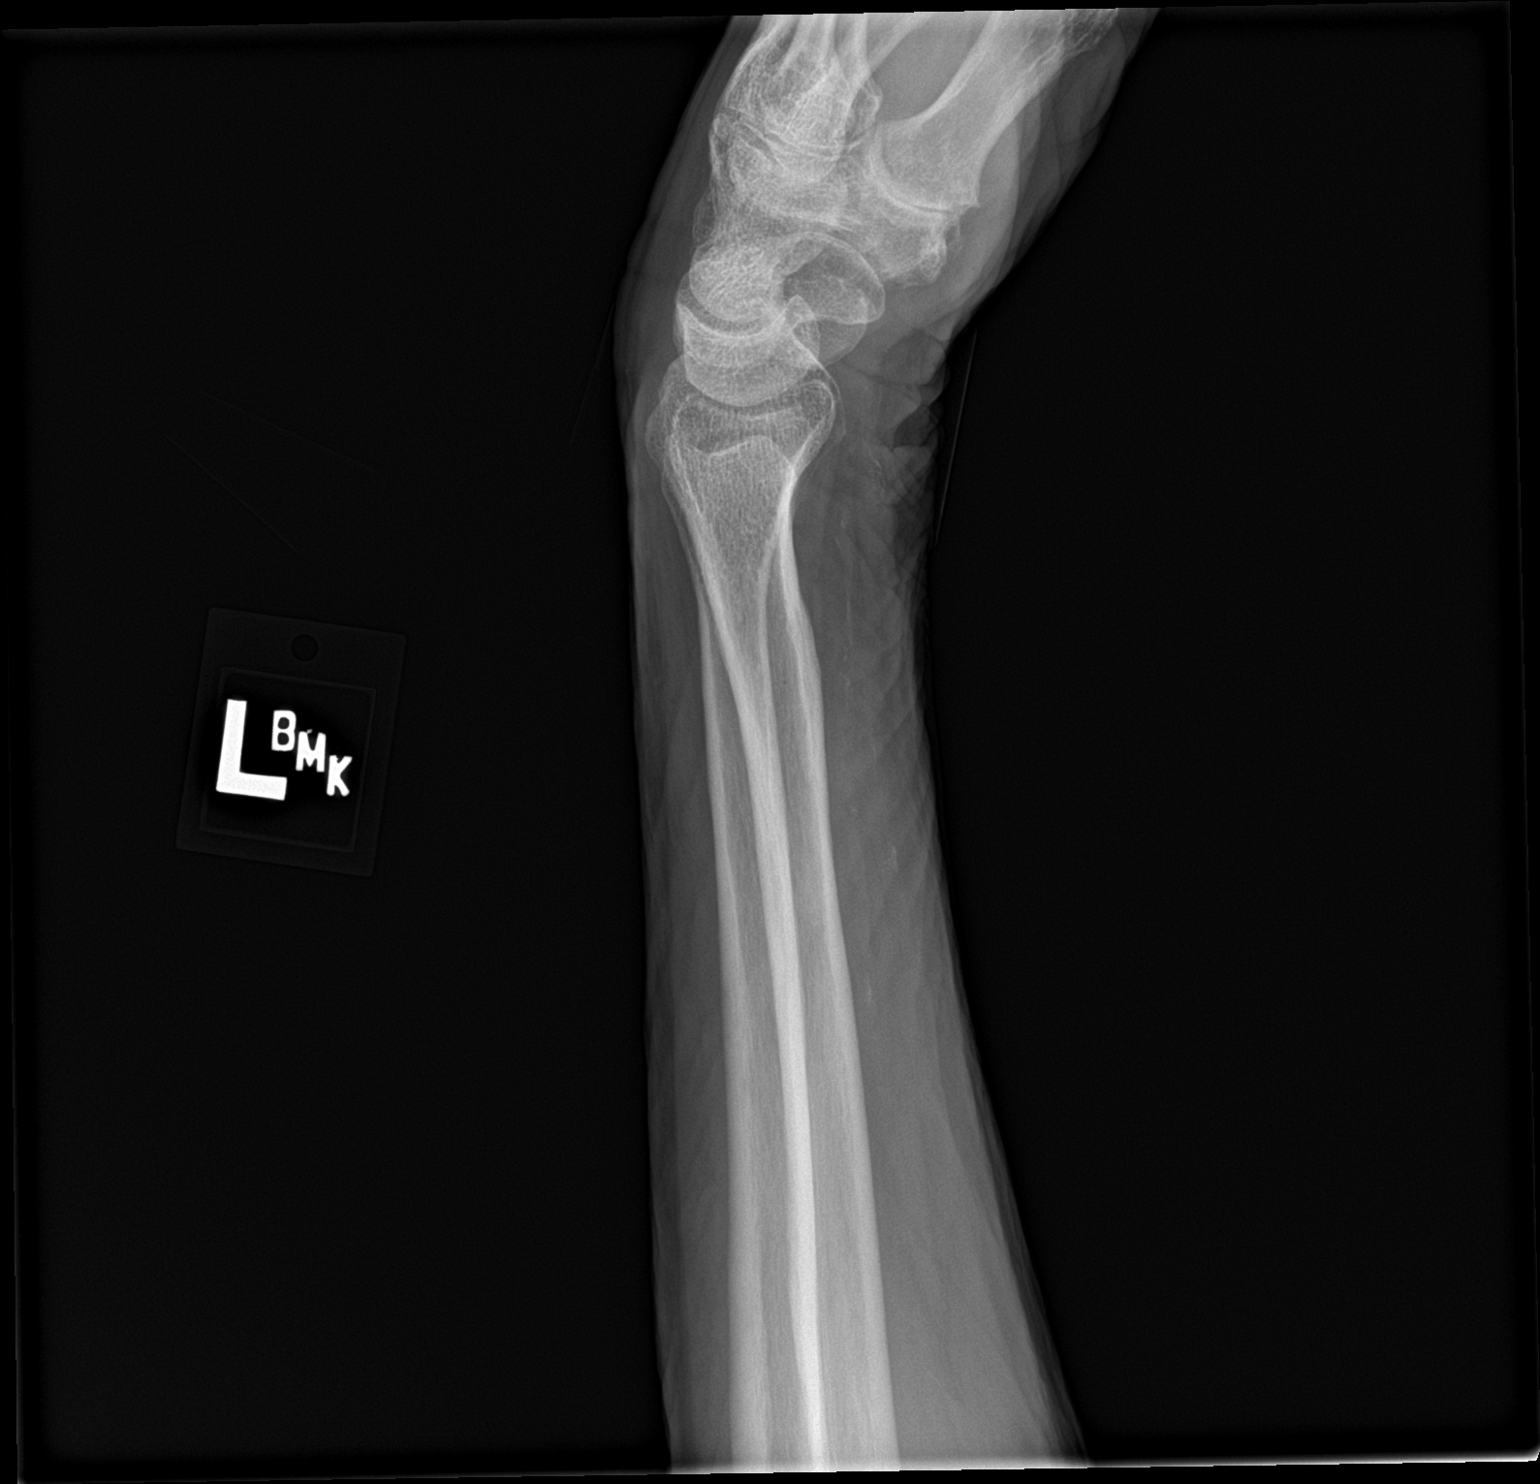

[forearm lat]
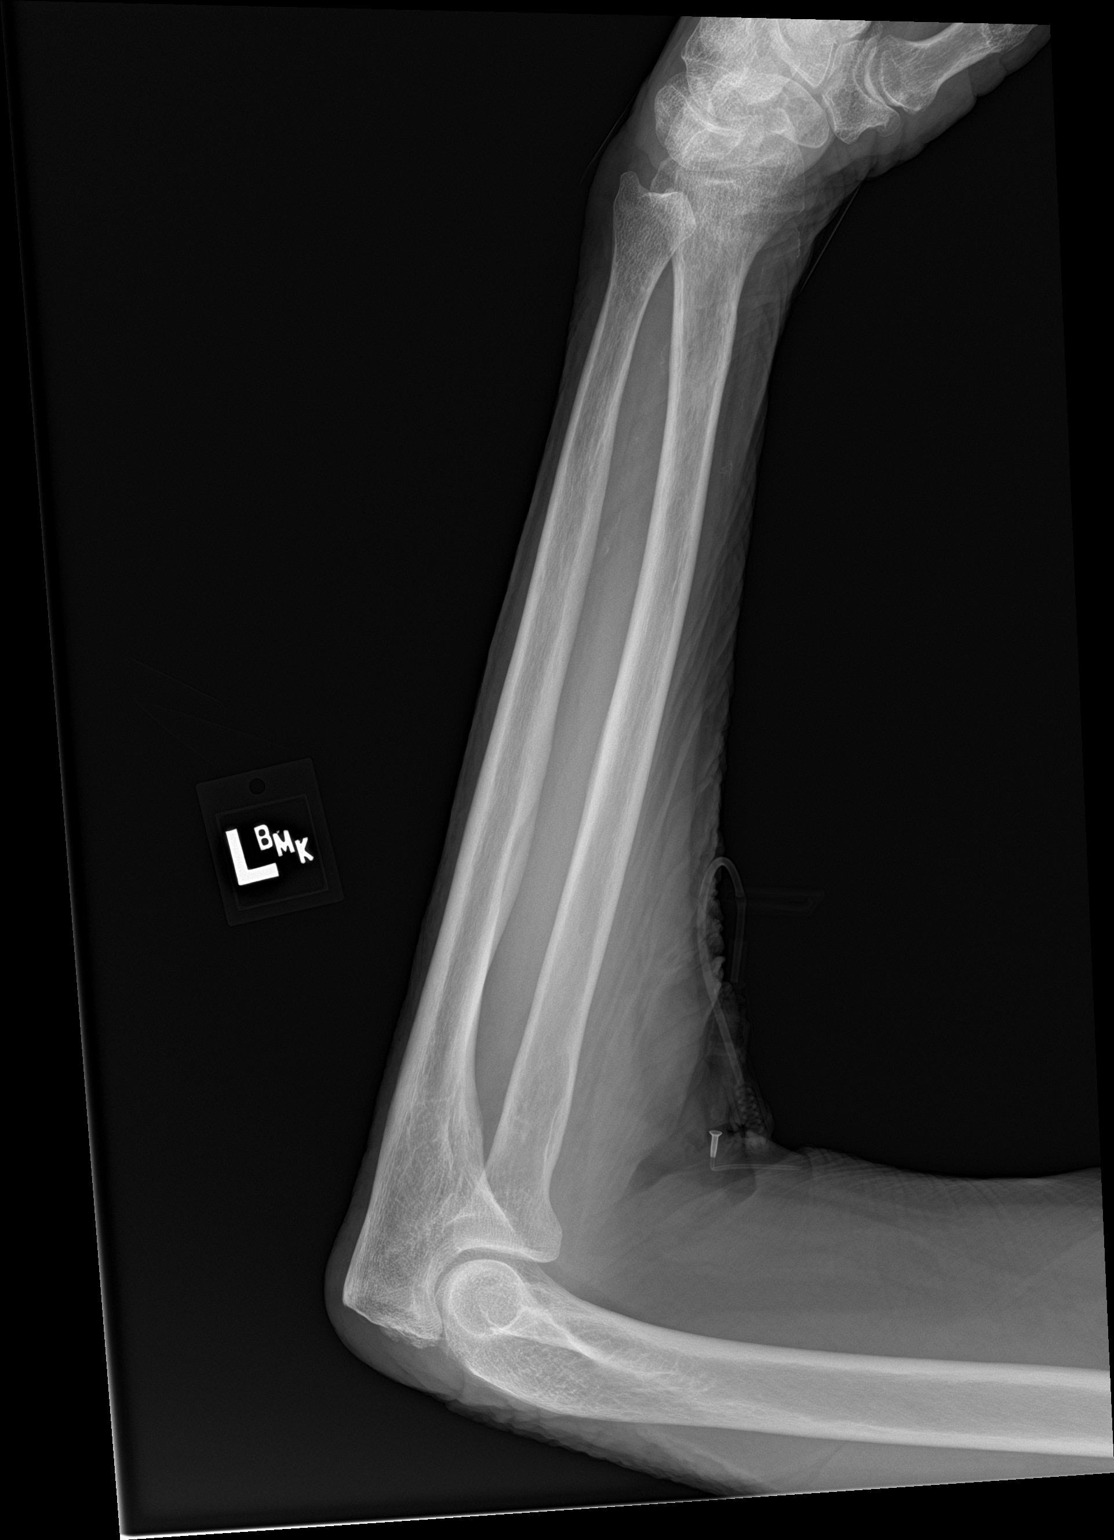

[forearm ap (2 of 2)]
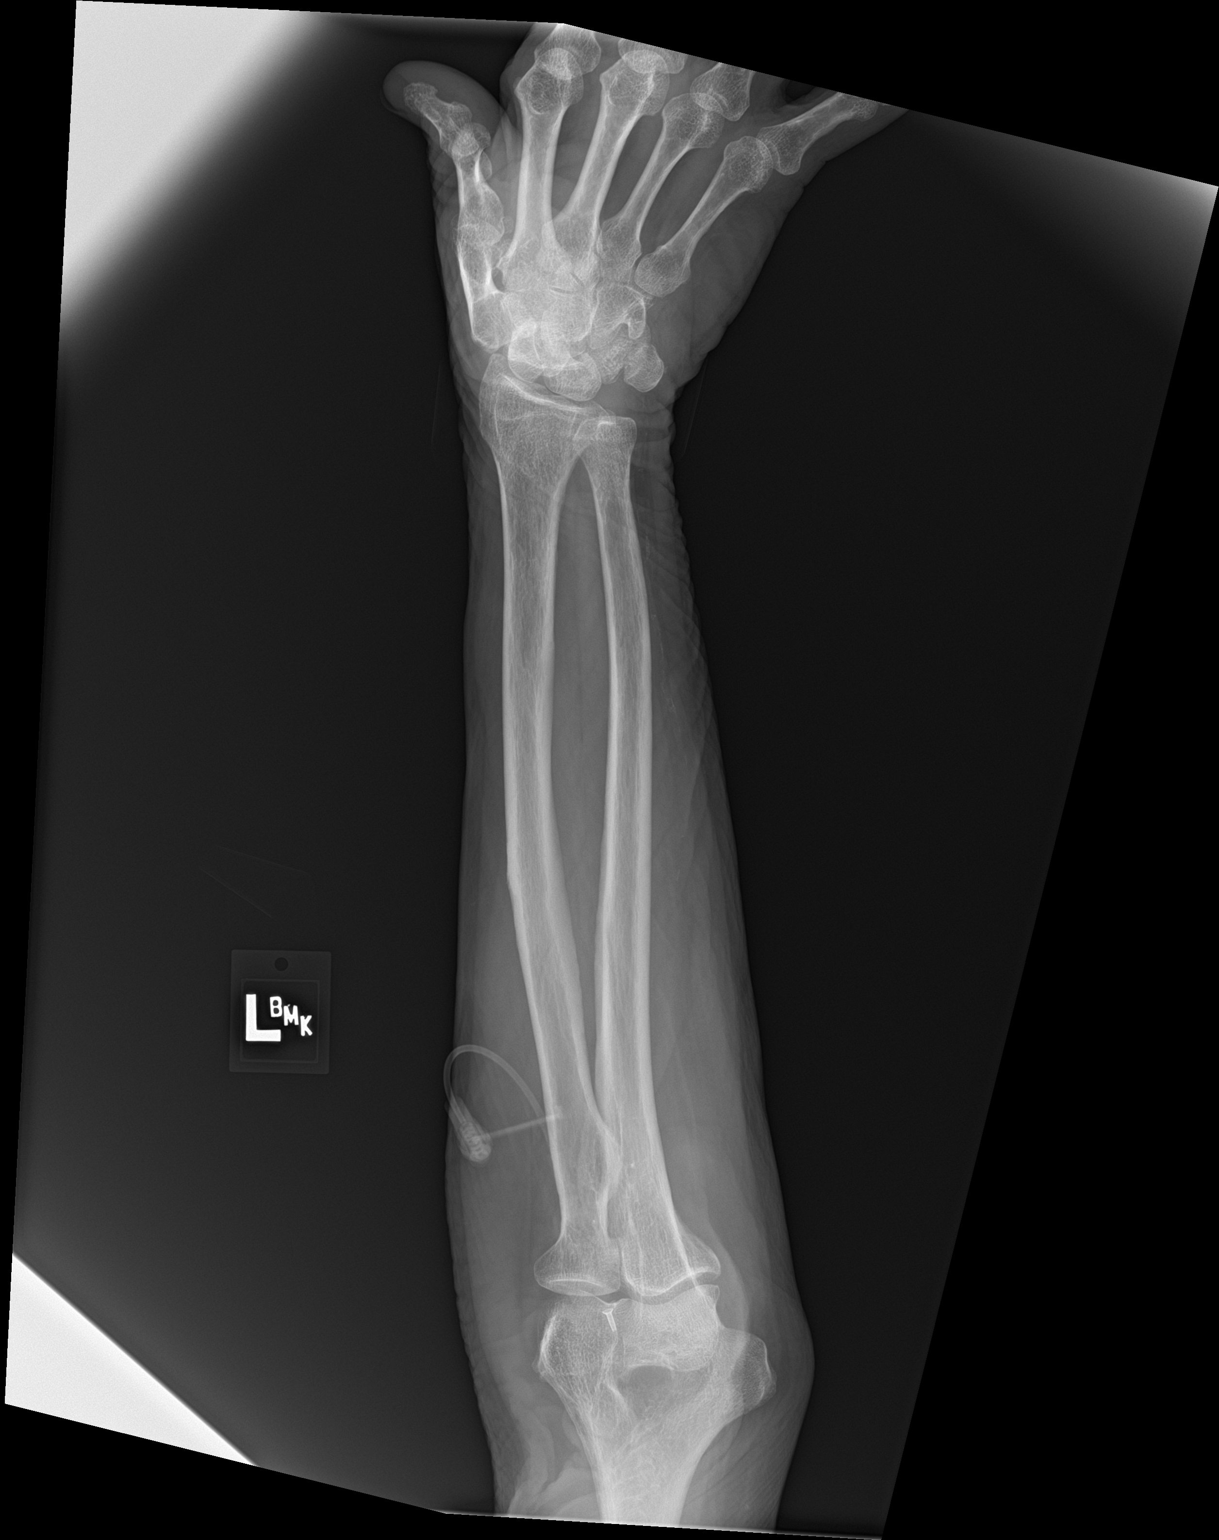

[3 of 3 positions shown; findings below may reference images not displayed]

FINDINGS: There is no acute fracture or dislocation. The bones are osteopenic.
Degenerative changes of the base of the thumb. The soft tissues are
unremarkable.
IMPRESSION: No acute fracture or dislocation.

## 2021-07-06 ENCOUNTER — Emergency Department
Admission: EM | Admit: 2021-07-06 | Discharge: 2021-07-06 | Disposition: A | Discharge status: Home / Self Care | Payer: Medicare Other | Attending: Student in an Organized Health Care Education/Training Program | Admitting: Student in an Organized Health Care Education/Training Program

## 2021-07-06 ENCOUNTER — Other Ambulatory Visit: Payer: Self-pay

## 2021-07-06 ENCOUNTER — Emergency Department: Payer: Medicare Other

## 2021-07-06 ENCOUNTER — Encounter: Payer: Self-pay | Admitting: Emergency Medicine

## 2021-07-06 DIAGNOSIS — R443 Hallucinations, unspecified: Secondary | ICD-10-CM | POA: Insufficient documentation

## 2021-07-06 DIAGNOSIS — R531 Weakness: Secondary | ICD-10-CM | POA: Diagnosis present

## 2021-07-06 LAB — CBC WITH DIFFERENTIAL/PLATELET
Abs Immature Granulocytes: 0.03 10*3/uL (ref 0.00–0.07)
Basophils Absolute: 0.1 10*3/uL (ref 0.0–0.1)
Basophils Relative: 1 %
Eosinophils Absolute: 0.1 10*3/uL (ref 0.0–0.5)
Eosinophils Relative: 1 %
HCT: 47.6 % (ref 39.0–52.0)
Hemoglobin: 15 g/dL (ref 13.0–17.0)
Immature Granulocytes: 0 %
Lymphocytes Relative: 22 %
Lymphs Abs: 2.1 10*3/uL (ref 0.7–4.0)
MCH: 27.4 pg (ref 26.0–34.0)
MCHC: 31.5 g/dL (ref 30.0–36.0)
MCV: 86.9 fL (ref 80.0–100.0)
Monocytes Absolute: 1 10*3/uL (ref 0.1–1.0)
Monocytes Relative: 11 %
Neutro Abs: 6.3 10*3/uL (ref 1.7–7.7)
Neutrophils Relative %: 65 %
Platelets: 183 10*3/uL (ref 150–400)
RBC: 5.48 MIL/uL (ref 4.22–5.81)
RDW: 16.3 % — ABNORMAL HIGH (ref 11.5–15.5)
WBC: 9.6 10*3/uL (ref 4.0–10.5)
nRBC: 0 % (ref 0.0–0.2)

## 2021-07-06 LAB — COMPREHENSIVE METABOLIC PANEL
ALT: 13 U/L (ref 0–44)
AST: 23 U/L (ref 15–41)
Albumin: 3.9 g/dL (ref 3.5–5.0)
Alkaline Phosphatase: 56 U/L (ref 38–126)
Anion gap: 7 (ref 5–15)
BUN: 25 mg/dL — ABNORMAL HIGH (ref 8–23)
CO2: 25 mmol/L (ref 22–32)
Calcium: 9.5 mg/dL (ref 8.9–10.3)
Chloride: 110 mmol/L (ref 98–111)
Creatinine, Ser: 1.12 mg/dL (ref 0.61–1.24)
GFR, Estimated: >60 mL/min (ref >60)
Glucose, Bld: 131 mg/dL — ABNORMAL HIGH (ref 70–99)
Potassium: 4.5 mmol/L (ref 3.5–5.1)
Sodium: 142 mmol/L (ref 135–145)
Total Bilirubin: 1.2 mg/dL (ref 0.3–1.2)
Total Protein: 6.9 g/dL (ref 6.5–8.1)

## 2021-07-06 LAB — URINALYSIS, COMPLETE (UACMP) WITH MICROSCOPIC
Bacteria, UA: NONE SEEN
Bilirubin Urine: NEGATIVE
Glucose, UA: NEGATIVE mg/dL
Hgb urine dipstick: NEGATIVE
Ketones, ur: NEGATIVE mg/dL
Leukocytes,Ua: NEGATIVE
Nitrite: NEGATIVE
Protein, ur: NEGATIVE mg/dL
Specific Gravity, Urine: 1.018 (ref 1.005–1.030)
Squamous Epithelial / HPF: NONE SEEN (ref 0–5)
pH: 5 (ref 5.0–8.0)

## 2021-07-06 MED ORDER — SODIUM CHLORIDE 0.9 % IV BOLUS
500.0000 mL | Freq: Once | INTRAVENOUS | Status: AC
  Administered 2021-07-06: 500 mL via INTRAVENOUS

## 2021-07-06 MED ORDER — RISPERIDONE 0.5 MG PO TABS: 0.5000 mg | ORAL_TABLET | Freq: Two times a day (BID) | ORAL | 0 refills | Status: DC

## 2021-07-06 NOTE — ED Provider Notes (Addendum)
? ?[facility] ?Provider Note ? ? ? Event Date/Time  ? First MD Initiated Contact with Patient 07/06/21 1045   ?  (approximate) ? ? ?History  ? ?Hallucinations and Weakness ? ? ?HPI ? ?Steve Bryant is a 88 y.o. male presents to the ER for evaluation of weakness and worsening hallucinations the past few days.  Brought in by family member he states that last night was having severe hallucinations and a woman's eyeballs on the floor.  She states that he has had similar episodes when he got dehydrated when he had a urinary tract infection.  Has had some falls.  Patient denies any new pain.  No new medications.  Denies any chest discomfort no shortness of breath. ?  ? ? ?Physical Exam  ? ?Triage Vital Signs: ?ED Triage Vitals  ?Enc Vitals Group  ?   BP 07/06/21 1038 (!) 147/79  ?   Pulse Rate 07/06/21 1038 62  ?   Resp 07/06/21 1038 18  ?   Temp 07/06/21 1038 97.6 ?F (36.4 ?C)  ?   Temp Source 07/06/21 1038 Oral  ?   SpO2 07/06/21 1038 98 %  ?   Weight 07/06/21 1036 154 lb 5.2 oz (70 kg)  ?   Height 07/06/21 1036 '5\' 9"'$  (1.753 m)  ?   Head Circumference --   ?   Peak Flow --   ?   Pain Score 07/06/21 1036 0  ?   Pain Loc --   ?   Pain Edu? --   ?   Excl. in [place]? --   ? ? ?Most recent vital signs: ?Vitals:  ? 07/06/21 1048 07/06/21 1315  ?BP: (!) 160/96 (!) 187/71  ?Pulse: 66 60  ?Resp: 15 14  ?Temp:    ?SpO2: 99% 98%  ? ? ? ?Constitutional: Alert  ?Eyes: Conjunctivae are normal.  ?Head: Atraumatic. ?Nose: No congestion/rhinnorhea. ?Mouth/Throat: Mucous membranes are moist.   ?Neck: Painless ROM.  ?Cardiovascular:   Good peripheral circulation. ?Respiratory: Normal respiratory effort.  No retractions.  ?Gastrointestinal: Soft and nontender.  ?Musculoskeletal:  no deformity ?Neurologic:  MAE spontaneously. No gross focal neurologic deficits are appreciated.  ?Skin:  Skin is warm, dry and intact. No rash noted. ?Psychiatric: Mood and affect are normal. Speech and behavior are normal. ? ? ? ?ED  Results / Procedures / Treatments  ? ?Labs ?(all labs ordered are listed, but only abnormal results are displayed) ?Labs Reviewed  ?COMPREHENSIVE METABOLIC PANEL - Abnormal; Notable for the following components:  ?    Result Value  ? Glucose, Bld 131 (*)   ? BUN 25 (*)   ? All other components within normal limits  ?CBC WITH DIFFERENTIAL/PLATELET - Abnormal; Notable for the following components:  ? RDW 16.3 (*)   ? All other components within normal limits  ?URINALYSIS, COMPLETE (UACMP) WITH MICROSCOPIC - Abnormal; Notable for the following components:  ? Color, Urine YELLOW (*)   ? APPearance CLEAR (*)   ? All other components within normal limits  ? ? ? ?EKG ? ?ED ECG REPORT ?I, Merlyn Lot, the attending physician, personally viewed and interpreted this ECG. ? ? Date: 07/06/2021 ? EKG Time: 10:46 ? Rate: 70 ? Rhythm: sinus ? Axis: normal ? Intervals:  normal ? ST&T Change: no stemi, no depressions ? ? ? ?RADIOLOGY ?Please see ED Course for my review and interpretation. ? ?I personally reviewed all radiographic images ordered to evaluate for the above acute complaints and reviewed radiology  reports and findings.  These findings were personally discussed with the patient.  Please see medical record for radiology report. ? ? ? ?PROCEDURES: ? ?Critical Care performed: No ? ?Procedures ? ? ?MEDICATIONS ORDERED IN ED: ?Medications  ?sodium chloride 0.9 % bolus 500 mL (0 mLs Intravenous Stopped 07/06/21 1339)  ? ? ? ?IMPRESSION / MDM / ASSESSMENT AND PLAN / ED COURSE  ?I reviewed the triage vital signs and the nursing notes. ?             ?               ? ?Differential diagnosis includes, but is not limited to, Dehydration, sepsis, pna, uti, hypoglycemia, cva, drug effect, withdrawal, encephalitis ? ?Patient presented to the ER for evaluation of symptoms as described above.  Nontoxic.  Family concerned he might have a UTI however has had similar symptoms in the past.  No formal diagnosis of dementia.  No numbness  or tingling.  Seems less consistent with CVA CT imaging ordered for the above differential but have a lower suspicion for TIA or CVA.  Sounds more consistent with sundowning. ? ?Clinical Course as of 07/06/21 1515  ?Sun Jul 06, 2021  ?1114 Chest x-ray on my review and interpretation does not show any evidence of infiltrate [PR]  ?1220 CT head with no evidence of acute intracranial abnormality. [PR]  ?89 Blood work is reassuring no sign of infection.  UA normal.  Denying any pain or discomfort.  Patient declining ER consultation with psychiatry since they have dealt with this before and prefer to follow-up with PCP.  They feel comfortable taking the patient home where they can monitor him.  Have recommended switching to respite all as may help with the agitation more discussed signs and symptoms for which she should return to the ER.  Patient family agree to plan. [PR]  ?  ?Clinical Course User Index ?[PR] Merlyn Lot, MD  ? ? ? ?FINAL CLINICAL IMPRESSION(S) / ED DIAGNOSES  ? ?Final diagnoses:  ?Hallucinations  ? ? ? ?Rx / DC Orders  ? ?ED Discharge Orders   ? ?      Ordered  ?  risperiDONE (RISPERDAL) 0.5 MG tablet  2 times daily       ? 07/06/21 1335  ? ?  ?  ? ?  ? ? ? ?Note:  This document was prepared using Dragon voice recognition software and may include unintentional dictation errors. ? ?  ?Merlyn Lot, MD ?07/06/21 1515 ? ?  ?Merlyn Lot, MD ?07/06/21 1515 ? ?

## 2021-07-06 NOTE — ED Notes (Signed)
Pt attempted to urinate for UA sample. Helped to stand and use urinal. Pt states he urinated right before coming to hospital. Will try again later. ?

## 2021-07-06 NOTE — ED Notes (Signed)
Pt to ED with daughter and granddaughter. Pt has had weak legs since 2 weeks. Since last week has been seeing people who are not there. Daughter states that last time this happened, pt had a UTI. Daughter states pt has been complaining that legs hurt and he has not been wanting to walk and do things as he usually does.  ?

## 2021-07-06 NOTE — Discharge Instructions (Signed)
I sent a prescription for respite all to your pharmacy.  I would take this instead of the Seroquel.  She noticed that there is a certain time in the evening where the agitation or hallucinations are worse I recommend starting this medication roughly 1 hour before that time. ?

## 2021-07-06 NOTE — ED Triage Notes (Signed)
Pt family reports pt started with hallucinations on Wednesday and has gotten more weak since then.  ?

## 2021-07-10 ENCOUNTER — Inpatient Hospital Stay
Admission: EM | Admit: 2021-07-10 | Discharge: 2021-07-12 | DRG: 871 | Disposition: A | Discharge status: Home / Self Care | Payer: Medicare Other | Attending: Internal Medicine | Admitting: Internal Medicine

## 2021-07-10 ENCOUNTER — Emergency Department: Payer: Medicare Other

## 2021-07-10 ENCOUNTER — Encounter: Payer: Self-pay | Admitting: Emergency Medicine

## 2021-07-10 ENCOUNTER — Other Ambulatory Visit: Payer: Self-pay

## 2021-07-10 DIAGNOSIS — E785 Hyperlipidemia, unspecified: Secondary | ICD-10-CM | POA: Diagnosis present

## 2021-07-10 DIAGNOSIS — F32A Depression, unspecified: Secondary | ICD-10-CM | POA: Diagnosis present

## 2021-07-10 DIAGNOSIS — R54 Age-related physical debility: Secondary | ICD-10-CM | POA: Diagnosis present

## 2021-07-10 DIAGNOSIS — I10 Essential (primary) hypertension: Secondary | ICD-10-CM | POA: Diagnosis present

## 2021-07-10 DIAGNOSIS — I959 Hypotension, unspecified: Secondary | ICD-10-CM | POA: Diagnosis present

## 2021-07-10 DIAGNOSIS — J449 Chronic obstructive pulmonary disease, unspecified: Secondary | ICD-10-CM | POA: Diagnosis not present

## 2021-07-10 DIAGNOSIS — I739 Peripheral vascular disease, unspecified: Secondary | ICD-10-CM | POA: Diagnosis present

## 2021-07-10 DIAGNOSIS — A419 Sepsis, unspecified organism: Principal | ICD-10-CM | POA: Diagnosis present

## 2021-07-10 DIAGNOSIS — J45909 Unspecified asthma, uncomplicated: Secondary | ICD-10-CM | POA: Diagnosis present

## 2021-07-10 DIAGNOSIS — Z6822 Body mass index (BMI) 22.0-22.9, adult: Secondary | ICD-10-CM | POA: Diagnosis not present

## 2021-07-10 DIAGNOSIS — N1831 Chronic kidney disease, stage 3a: Secondary | ICD-10-CM | POA: Diagnosis present

## 2021-07-10 DIAGNOSIS — Z7902 Long term (current) use of antithrombotics/antiplatelets: Secondary | ICD-10-CM

## 2021-07-10 DIAGNOSIS — N179 Acute kidney failure, unspecified: Secondary | ICD-10-CM | POA: Diagnosis present

## 2021-07-10 DIAGNOSIS — Z8673 Personal history of transient ischemic attack (TIA), and cerebral infarction without residual deficits: Secondary | ICD-10-CM | POA: Diagnosis not present

## 2021-07-10 DIAGNOSIS — I130 Hypertensive heart and chronic kidney disease with heart failure and stage 1 through stage 4 chronic kidney disease, or unspecified chronic kidney disease: Secondary | ICD-10-CM | POA: Diagnosis present

## 2021-07-10 DIAGNOSIS — K219 Gastro-esophageal reflux disease without esophagitis: Secondary | ICD-10-CM | POA: Diagnosis present

## 2021-07-10 DIAGNOSIS — J440 Chronic obstructive pulmonary disease with acute lower respiratory infection: Secondary | ICD-10-CM | POA: Diagnosis present

## 2021-07-10 DIAGNOSIS — I639 Cerebral infarction, unspecified: Secondary | ICD-10-CM | POA: Diagnosis present

## 2021-07-10 DIAGNOSIS — E039 Hypothyroidism, unspecified: Secondary | ICD-10-CM | POA: Diagnosis present

## 2021-07-10 DIAGNOSIS — I714 Abdominal aortic aneurysm, without rupture, unspecified: Secondary | ICD-10-CM | POA: Diagnosis present

## 2021-07-10 DIAGNOSIS — Z823 Family history of stroke: Secondary | ICD-10-CM

## 2021-07-10 DIAGNOSIS — Z79890 Hormone replacement therapy (postmenopausal): Secondary | ICD-10-CM

## 2021-07-10 DIAGNOSIS — H548 Legal blindness, as defined in USA: Secondary | ICD-10-CM | POA: Diagnosis present

## 2021-07-10 DIAGNOSIS — Z66 Do not resuscitate: Secondary | ICD-10-CM | POA: Diagnosis present

## 2021-07-10 DIAGNOSIS — Z87891 Personal history of nicotine dependence: Secondary | ICD-10-CM

## 2021-07-10 DIAGNOSIS — Z91013 Allergy to seafood: Secondary | ICD-10-CM

## 2021-07-10 DIAGNOSIS — I5032 Chronic diastolic (congestive) heart failure: Secondary | ICD-10-CM | POA: Diagnosis present

## 2021-07-10 DIAGNOSIS — I2510 Atherosclerotic heart disease of native coronary artery without angina pectoris: Secondary | ICD-10-CM | POA: Diagnosis present

## 2021-07-10 DIAGNOSIS — Z85828 Personal history of other malignant neoplasm of skin: Secondary | ICD-10-CM

## 2021-07-10 DIAGNOSIS — Z79899 Other long term (current) drug therapy: Secondary | ICD-10-CM

## 2021-07-10 DIAGNOSIS — J189 Pneumonia, unspecified organism: Principal | ICD-10-CM | POA: Diagnosis present

## 2021-07-10 DIAGNOSIS — Z20822 Contact with and (suspected) exposure to covid-19: Secondary | ICD-10-CM | POA: Diagnosis present

## 2021-07-10 DIAGNOSIS — Z7982 Long term (current) use of aspirin: Secondary | ICD-10-CM

## 2021-07-10 LAB — COMPREHENSIVE METABOLIC PANEL
ALT: 14 U/L (ref 0–44)
AST: 29 U/L (ref 15–41)
Albumin: 3.2 g/dL — ABNORMAL LOW (ref 3.5–5.0)
Alkaline Phosphatase: 55 U/L (ref 38–126)
Anion gap: 7 (ref 5–15)
BUN: 45 mg/dL — ABNORMAL HIGH (ref 8–23)
CO2: 22 mmol/L (ref 22–32)
Calcium: 9 mg/dL (ref 8.9–10.3)
Chloride: 112 mmol/L — ABNORMAL HIGH (ref 98–111)
Creatinine, Ser: 1.47 mg/dL — ABNORMAL HIGH (ref 0.61–1.24)
GFR, Estimated: 44 mL/min — ABNORMAL LOW (ref >60)
Glucose, Bld: 101 mg/dL — ABNORMAL HIGH (ref 70–99)
Potassium: 3.7 mmol/L (ref 3.5–5.1)
Sodium: 141 mmol/L (ref 135–145)
Total Bilirubin: 1.6 mg/dL — ABNORMAL HIGH (ref 0.3–1.2)
Total Protein: 6 g/dL — ABNORMAL LOW (ref 6.5–8.1)

## 2021-07-10 LAB — URINALYSIS, ROUTINE W REFLEX MICROSCOPIC
Bilirubin Urine: NEGATIVE
Glucose, UA: NEGATIVE mg/dL
Hgb urine dipstick: NEGATIVE
Ketones, ur: NEGATIVE mg/dL
Leukocytes,Ua: NEGATIVE
Nitrite: NEGATIVE
Protein, ur: NEGATIVE mg/dL
Specific Gravity, Urine: 1.023 (ref 1.005–1.030)
pH: 5 (ref 5.0–8.0)

## 2021-07-10 LAB — CBC WITH DIFFERENTIAL/PLATELET
Abs Immature Granulocytes: 0.06 10*3/uL (ref 0.00–0.07)
Basophils Absolute: 0.1 10*3/uL (ref 0.0–0.1)
Basophils Relative: 0 %
Eosinophils Absolute: 0 10*3/uL (ref 0.0–0.5)
Eosinophils Relative: 0 %
HCT: 45 % (ref 39.0–52.0)
Hemoglobin: 14.4 g/dL (ref 13.0–17.0)
Immature Granulocytes: 0 %
Lymphocytes Relative: 10 %
Lymphs Abs: 1.6 10*3/uL (ref 0.7–4.0)
MCH: 27.8 pg (ref 26.0–34.0)
MCHC: 32 g/dL (ref 30.0–36.0)
MCV: 86.9 fL (ref 80.0–100.0)
Monocytes Absolute: 1.1 10*3/uL — ABNORMAL HIGH (ref 0.1–1.0)
Monocytes Relative: 7 %
Neutro Abs: 13.1 10*3/uL — ABNORMAL HIGH (ref 1.7–7.7)
Neutrophils Relative %: 83 %
Platelets: 157 10*3/uL (ref 150–400)
RBC: 5.18 MIL/uL (ref 4.22–5.81)
RDW: 16.2 % — ABNORMAL HIGH (ref 11.5–15.5)
WBC: 15.9 10*3/uL — ABNORMAL HIGH (ref 4.0–10.5)
nRBC: 0 % (ref 0.0–0.2)

## 2021-07-10 LAB — RESP PANEL BY RT-PCR (FLU A&B, COVID) ARPGX2
Influenza A by PCR: NEGATIVE
Influenza B by PCR: NEGATIVE
SARS Coronavirus 2 by RT PCR: NEGATIVE

## 2021-07-10 LAB — LACTIC ACID, PLASMA: Lactic Acid, Venous: 1.6 mmol/L (ref 0.5–1.9)

## 2021-07-10 LAB — PROCALCITONIN: Procalcitonin: 1.44 ng/mL

## 2021-07-10 LAB — PROTIME-INR
INR: 1.1 (ref 0.8–1.2)
Prothrombin Time: 14.4 seconds (ref 11.4–15.2)

## 2021-07-10 LAB — STREP PNEUMONIAE URINARY ANTIGEN: Strep Pneumo Urinary Antigen: NEGATIVE

## 2021-07-10 LAB — BRAIN NATRIURETIC PEPTIDE: B Natriuretic Peptide: 101.9 pg/mL — ABNORMAL HIGH (ref 0.0–100.0)

## 2021-07-10 MED ORDER — ASPIRIN EC 81 MG PO TBEC
81.0000 mg | DELAYED_RELEASE_TABLET | Freq: Every day | ORAL | Status: DC
  Administered 2021-07-10 – 2021-07-12 (×3): 81 mg via ORAL
  Filled 2021-07-10 (×3): qty 1

## 2021-07-10 MED ORDER — DORZOLAMIDE HCL 2 % OP SOLN: 1.0000 [drp] | Freq: Every day | OPHTHALMIC | Status: DC

## 2021-07-10 MED ORDER — LEVOTHYROXINE SODIUM 50 MCG PO TABS
50.0000 ug | ORAL_TABLET | Freq: Every day | ORAL | Status: DC
  Administered 2021-07-11 – 2021-07-12 (×2): 50 ug via ORAL
  Filled 2021-07-10 (×2): qty 1

## 2021-07-10 MED ORDER — LACTATED RINGERS IV SOLN: INTRAVENOUS | Status: DC

## 2021-07-10 MED ORDER — LATANOPROST 0.005 % OP SOLN
1.0000 [drp] | Freq: Every day | OPHTHALMIC | Status: DC
  Filled 2021-07-10: qty 2.5

## 2021-07-10 MED ORDER — SODIUM CHLORIDE 0.9 % IV SOLN
2.0000 g | INTRAVENOUS | Status: DC
  Administered 2021-07-10 – 2021-07-11 (×2): 2 g via INTRAVENOUS
  Filled 2021-07-10 (×3): qty 20

## 2021-07-10 MED ORDER — LACTATED RINGERS IV BOLUS (SEPSIS)
1000.0000 mL | Freq: Once | INTRAVENOUS | Status: AC
  Administered 2021-07-10: 1000 mL via INTRAVENOUS

## 2021-07-10 MED ORDER — CLOPIDOGREL BISULFATE 75 MG PO TABS
75.0000 mg | ORAL_TABLET | Freq: Every day | ORAL | Status: DC
  Administered 2021-07-10 – 2021-07-12 (×3): 75 mg via ORAL
  Filled 2021-07-10 (×3): qty 1

## 2021-07-10 MED ORDER — QUETIAPINE FUMARATE 25 MG PO TABS
25.0000 mg | ORAL_TABLET | Freq: Two times a day (BID) | ORAL | Status: DC
  Administered 2021-07-10 – 2021-07-12 (×3): 25 mg via ORAL
  Filled 2021-07-10 (×6): qty 1

## 2021-07-10 MED ORDER — ROSUVASTATIN CALCIUM 5 MG PO TABS
5.0000 mg | ORAL_TABLET | Freq: Every day | ORAL | Status: DC
  Administered 2021-07-10 – 2021-07-11 (×2): 5 mg via ORAL
  Filled 2021-07-10 (×2): qty 1

## 2021-07-10 MED ORDER — ONDANSETRON HCL 4 MG/2ML IJ SOLN
4.0000 mg | Freq: Three times a day (TID) | INTRAMUSCULAR | Status: DC | PRN
Start: 2021-07-10 — End: 2021-07-12

## 2021-07-10 MED ORDER — DM-GUAIFENESIN ER 30-600 MG PO TB12
1.0000 | ORAL_TABLET | Freq: Two times a day (BID) | ORAL | Status: DC | PRN
Start: 2021-07-10 — End: 2021-07-12

## 2021-07-10 MED ORDER — ALBUTEROL SULFATE (2.5 MG/3ML) 0.083% IN NEBU
2.5000 mg | INHALATION_SOLUTION | RESPIRATORY_TRACT | Status: DC | PRN
Start: 2021-07-10 — End: 2021-07-12

## 2021-07-10 MED ORDER — HYDRALAZINE HCL 20 MG/ML IJ SOLN
5.0000 mg | INTRAMUSCULAR | Status: DC | PRN
  Administered 2021-07-11 – 2021-07-12 (×3): 5 mg via INTRAVENOUS
  Filled 2021-07-10 (×3): qty 1

## 2021-07-10 MED ORDER — LACTATED RINGERS IV BOLUS (SEPSIS): 250.0000 mL | Freq: Once | INTRAVENOUS | Status: DC

## 2021-07-10 MED ORDER — PREGABALIN 75 MG PO CAPS
75.0000 mg | ORAL_CAPSULE | Freq: Three times a day (TID) | ORAL | Status: DC
  Administered 2021-07-10 – 2021-07-12 (×5): 75 mg via ORAL
  Filled 2021-07-10 (×5): qty 1

## 2021-07-10 MED ORDER — SODIUM CHLORIDE 0.9 % IV SOLN
500.0000 mg | INTRAVENOUS | Status: DC
  Administered 2021-07-10 – 2021-07-11 (×2): 500 mg via INTRAVENOUS
  Filled 2021-07-10 (×2): qty 5

## 2021-07-10 MED ORDER — ADULT MULTIVITAMIN W/MINERALS CH
1.0000 | ORAL_TABLET | Freq: Every day | ORAL | Status: DC
  Administered 2021-07-10 – 2021-07-12 (×3): 1 via ORAL
  Filled 2021-07-10 (×3): qty 1

## 2021-07-10 MED ORDER — ACETAMINOPHEN 325 MG PO TABS: 650.0000 mg | ORAL_TABLET | Freq: Four times a day (QID) | ORAL | Status: DC | PRN

## 2021-07-10 MED ORDER — MIRABEGRON ER 25 MG PO TB24
25.0000 mg | ORAL_TABLET | Freq: Every day | ORAL | Status: DC
  Administered 2021-07-10 – 2021-07-12 (×3): 25 mg via ORAL
  Filled 2021-07-10 (×3): qty 1

## 2021-07-10 MED ORDER — ENOXAPARIN SODIUM 40 MG/0.4ML IJ SOSY
40.0000 mg | PREFILLED_SYRINGE | INTRAMUSCULAR | Status: DC
  Administered 2021-07-10 – 2021-07-11 (×2): 40 mg via SUBCUTANEOUS
  Filled 2021-07-10 (×2): qty 0.4

## 2021-07-10 MED ORDER — SODIUM CHLORIDE 0.9 % IV BOLUS
1000.0000 mL | Freq: Once | INTRAVENOUS | Status: AC
  Administered 2021-07-10: 1000 mL via INTRAVENOUS

## 2021-07-10 MED ORDER — PANTOPRAZOLE SODIUM 20 MG PO TBEC
20.0000 mg | DELAYED_RELEASE_TABLET | Freq: Every day | ORAL | Status: DC
  Administered 2021-07-10 – 2021-07-12 (×3): 20 mg via ORAL
  Filled 2021-07-10 (×3): qty 1

## 2021-07-10 MED ORDER — GABAPENTIN 100 MG PO CAPS
100.0000 mg | ORAL_CAPSULE | Freq: Two times a day (BID) | ORAL | Status: DC
Start: 2021-07-10 — End: 2021-07-12
  Administered 2021-07-10 – 2021-07-12 (×4): 100 mg via ORAL
  Filled 2021-07-10 (×4): qty 1

## 2021-07-10 MED ORDER — DORZOLAMIDE HCL-TIMOLOL MAL 2-0.5 % OP SOLN
1.0000 [drp] | Freq: Two times a day (BID) | OPHTHALMIC | Status: DC
  Filled 2021-07-10: qty 10

## 2021-07-10 NOTE — ED Notes (Signed)
Chuck from lab at bedside ?

## 2021-07-10 NOTE — Assessment & Plan Note (Addendum)
Sepsis due to CAP: Patient meets critical for sepsis with WBC 15.9, RR 24.  Initially hypotensive with blood pressure 83/53, which responded to IV fluid resuscitation.  Lactic acid 1.6. ?Clinically seems improving.  Preliminary blood cultures remain negative.  Procalcitonin at 1.44.  UA was not consistent with UTI and urine cultures pending. ?Strep pneumo negative, Legionella pending ?-Continue IV Rocephin and azithromycin ?- Mucinex for cough  ? -Bronchodilators ?-Follow-up urine legionella ? ? ?

## 2021-07-10 NOTE — ED Provider Notes (Signed)
? ?[facility] ?Provider Note ? ? Event Date/Time  ? First MD Initiated Contact with Patient 07/10/21 1444   ?  (approximate) ?History  ?Weakness ? ?HPI ?Steve Bryant is a 88 y.o. male with a stated past medical history of COPD, hyperlipidemia, hypothyroidism, CAD, CKD, CHF who presents for progressive weakness over the last 3 days.  Patient also states that he took his temperature this morning and had a fever of 100.6 Fahrenheit.  Family gave patient 1 g of Tylenol prior to arrival.  EMS noted patient to be hypotensive with blood pressures in the 88/55 range and placed him on 2 L nasal cannula given room air sat of 92%.  Patient does endorse cough over the last 2 days that is nonproductive as well as a history of recurrent urinary tract infections.  Patient currently denies any vision changes, tinnitus, difficulty speaking, facial droop, sore throat, chest pain, abdominal pain, nausea/vomiting/diarrhea, dysuria, or numbness/paresthesias in any extremity ?Physical Exam  ?Triage Vital Signs: ?ED Triage Vitals  ?Enc Vitals Group  ?   BP 07/10/21 1025 (!) 83/53  ?   Pulse Rate 07/10/21 1025 86  ?   Resp 07/10/21 1025 (!) 24  ?   Temp 07/10/21 1025 97.7 ?F (36.5 ?C)  ?   Temp Source 07/10/21 1025 Oral  ?   SpO2 07/10/21 1025 97 %  ?   Weight 07/10/21 1027 154 lb 5.2 oz (70 kg)  ?   Height 07/10/21 1027 '5\' 9"'$  (1.753 m)  ?   Head Circumference --   ?   Peak Flow --   ?   Pain Score 07/10/21 1027 0  ?   Pain Loc --   ?   Pain Edu? --   ?   Excl. in [place]? --   ? ?Most recent vital signs: ?Vitals:  ? 07/10/21 1345 07/10/21 1530  ?BP: 112/68 121/69  ?Pulse: 72 73  ?Resp: 15 17  ?Temp:    ?SpO2: 100% 100%  ? ?General: Awake, oriented x4. ?CV:  Good peripheral perfusion.  ?Resp:  Normal effort.  ?Abd:  No distention.  ?Other:  Elderly Caucasian male laying in bed in no distress ?ED Results / Procedures / Treatments  ?Labs ?(all labs ordered are listed, but only abnormal results are displayed) ?Labs  Reviewed  ?COMPREHENSIVE METABOLIC PANEL - Abnormal; Notable for the following components:  ?    Result Value  ? Chloride 112 (*)   ? Glucose, Bld 101 (*)   ? BUN 45 (*)   ? Creatinine, Ser 1.47 (*)   ? Total Protein 6.0 (*)   ? Albumin 3.2 (*)   ? Total Bilirubin 1.6 (*)   ? GFR, Estimated 44 (*)   ? All other components within normal limits  ?CBC WITH DIFFERENTIAL/PLATELET - Abnormal; Notable for the following components:  ? WBC 15.9 (*)   ? RDW 16.2 (*)   ? Neutro Abs 13.1 (*)   ? Monocytes Absolute 1.1 (*)   ? All other components within normal limits  ?BRAIN NATRIURETIC PEPTIDE - Abnormal; Notable for the following components:  ? B Natriuretic Peptide 101.9 (*)   ? All other components within normal limits  ?RESP PANEL BY RT-PCR (FLU A&B, COVID) [id number]  ?CULTURE, BLOOD (ROUTINE X 2)  ?CULTURE, BLOOD (ROUTINE X 2)  ?EXPECTORATED SPUTUM ASSESSMENT W GRAM STAIN, RFLX TO RESP C  ?LACTIC ACID, PLASMA  ?PROTIME-INR  ?LACTIC ACID, PLASMA  ?URINALYSIS, ROUTINE W REFLEX MICROSCOPIC  ?STREP PNEUMONIAE  URINARY ANTIGEN  ?LEGIONELLA PNEUMOPHILA SEROGP 1 UR AG  ? ?RADIOLOGY ?ED MD interpretation: 2 view chest x-ray shows new right lung pneumonia.  X-ray interpreted by me ?-Agree with radiology assessment ?Official radiology report(s): ?DG Chest 2 View ? ?Result Date: 07/10/2021 ?CLINICAL DATA:  Progressively worsening weakness.  Possible sepsis. EXAM: CHEST - 2 VIEW COMPARISON:  Chest x-ray dated July 06, 2021. FINDINGS: The heart size and mediastinal contours are within normal limits. New patchy airspace disease in the right lung, worst in the right upper lobe. Similar postsurgical changes in the right upper lobe and left apical pleural calcifications. No pleural effusion or pneumothorax. No acute osseous abnormality. Old healed left clavicle fracture again noted. IMPRESSION: 1. New right lung pneumonia. Electronically Signed   By: Titus Dubin M.D.   On: 07/10/2021 13:05   ?PROCEDURES: ?Critical Care performed: Yes, see  critical care procedure note(s) ?.1-3 Lead EKG Interpretation ?Performed by: Naaman Plummer, MD ?Authorized by: Naaman Plummer, MD  ? ?  Interpretation: normal   ?  ECG rate:  73 ?  ECG rate assessment: normal   ?  Rhythm: sinus rhythm   ?  Ectopy: none   ?  Conduction: normal   ?MEDICATIONS ORDERED IN ED: ?Medications  ?cefTRIAXone (ROCEPHIN) 2 g in sodium chloride 0.9 % 100 mL IVPB (0 g Intravenous Stopped 07/10/21 1437)  ?azithromycin (ZITHROMAX) 500 mg in sodium chloride 0.9 % 250 mL IVPB (0 mg Intravenous Stopped 07/10/21 1551)  ?lactated ringers bolus 1,000 mL (0 mLs Intravenous Stopped 07/10/21 1437)  ?  And  ?lactated ringers bolus 1,000 mL (0 mLs Intravenous Stopped 07/10/21 1437)  ?  And  ?lactated ringers bolus 250 mL (250 mLs Intravenous Not Given 07/10/21 1438)  ?ondansetron (ZOFRAN) injection 4 mg (has no administration in time range)  ?acetaminophen (TYLENOL) tablet 650 mg (has no administration in time range)  ?hydrALAZINE (APRESOLINE) injection 5 mg (has no administration in time range)  ?lactated ringers infusion ( Intravenous New Bag/Given 07/10/21 1456)  ?albuterol (PROVENTIL) (2.5 MG/3ML) 0.083% nebulizer solution 2.5 mg (has no administration in time range)  ?dextromethorphan-guaiFENesin (MUCINEX DM) 30-600 MG per 12 hr tablet 1 tablet (has no administration in time range)  ?levothyroxine (SYNTHROID) tablet 50 mcg (has no administration in time range)  ?aspirin EC tablet 81 mg (has no administration in time range)  ?rosuvastatin (CRESTOR) tablet 5 mg (has no administration in time range)  ?QUEtiapine (SEROQUEL) tablet 25 mg (has no administration in time range)  ?pantoprazole (PROTONIX) EC tablet 20 mg (has no administration in time range)  ?mirabegron ER (MYRBETRIQ) tablet 25 mg (has no administration in time range)  ?clopidogrel (PLAVIX) tablet 75 mg (has no administration in time range)  ?gabapentin (NEURONTIN) capsule 100 mg (has no administration in time range)  ?pregabalin (LYRICA) capsule 75 mg  (has no administration in time range)  ?multivitamin with minerals tablet 1 tablet (has no administration in time range)  ?dorzolamide-timolol (COSOPT) 22.3-6.8 MG/ML ophthalmic solution 1 drop (has no administration in time range)  ?latanoprost (XALATAN) 0.005 % ophthalmic solution 1 drop (has no administration in time range)  ?enoxaparin (LOVENOX) injection 40 mg (has no administration in time range)  ?sodium chloride 0.9 % bolus 1,000 mL (0 mLs Intravenous Stopped 07/10/21 1437)  ? ?IMPRESSION / MDM / ASSESSMENT AND PLAN / ED COURSE  ?I reviewed the triage vital signs and the nursing notes. ?             ?               ? ?  Presents with cough, and malaise concerning for pneumonia. ? ?DDx: PE, COPD exacerbation, Pneumothorax, TB, Atypical ACS, Esophageal Rupture, Toxic Exposure, Foreign Body Airway Obstruction. ? ?Workup: ?CXR ?CBC, CMP, lactate, troponin ? ?Given History, Exam, and Workup presentation most consistent with pneumonia. ? ?Findings: ?New right-sided pneumonia ?Leukocytosis to 16 ?Creatinine 1.47 ? ?Tx: ?Ceftriaxone 1g IV ?Azithromycin '500mg'$  IV ? ?Reassessment: Given patient's age and risk factors, patient will require admission to the internal medicine service for further evaluation and management ? ?Disposition: Admit ? ?  ?FINAL CLINICAL IMPRESSION(S) / ED DIAGNOSES  ? ?Final diagnoses:  ?Community acquired pneumonia of right lung, unspecified part of lung  ?Sepsis without acute organ dysfunction, due to unspecified organism [facility])  ? ?Rx / DC Orders  ? ?ED Discharge Orders   ? ? None  ? ?  ? ?Note:  This document was prepared using Dragon voice recognition software and may include unintentional dictation errors. ?  ?Naaman Plummer, MD ?07/10/21 1645 ? ?

## 2021-07-10 NOTE — Assessment & Plan Note (Signed)
-   Continue home medications 

## 2021-07-10 NOTE — ED Notes (Addendum)
Report sent to inpatient RN, Estanislado Spire. ?

## 2021-07-10 NOTE — Assessment & Plan Note (Signed)
No wheezing or rhonchi on auscultation. -Bronchodilators 

## 2021-07-10 NOTE — Assessment & Plan Note (Signed)
Baseline creatinine 1.12 on 07/06/2021.  His creatinine is 1.47, BUN 45 ?-On IV fluid as above ?-Hold Cozaar ?

## 2021-07-10 NOTE — Assessment & Plan Note (Signed)
-   Crestor 

## 2021-07-10 NOTE — ED Triage Notes (Signed)
Ems from home with co weakness progressively worsening.  Fever 100.6 this am and fam gave 1 gr tylenlol.  Ems vs 88/55, 92 % on ra on 2 liters now.  20 l wrist.cough and history of utis.   ?

## 2021-07-10 NOTE — Assessment & Plan Note (Signed)
Synthroid 

## 2021-07-10 NOTE — ED Notes (Signed)
Patient +-/0 ?

## 2021-07-10 NOTE — Consult Note (Signed)
CODE SEPSIS - PHARMACY COMMUNICATION ? ?**Broad Spectrum Antibiotics should be administered within 1 hour of Sepsis diagnosis** ? ?Time Code Sepsis Called/Page Received: 2426 ? ?Antibiotics Ordered: 1315 ? ?Time of 1st antibiotic administration: 1312 ? ?Additional action taken by pharmacy: Messaged RN to assist in triage of meds if unavailable. ? ?If necessary, Name of Provider/Nurse Contacted: Justice Britain (RN) ? ?Lorna Dibble ,PharmD ?Clinical Pharmacist  ?07/10/2021  1:05 PM ? ?

## 2021-07-10 NOTE — H&P (Signed)
?History and Physical  ? ? ?Steve Bryant PYK:[id number] DOB: 1926-01-14 DOA: 07/10/2021 ? ?Referring MD/NP/PA:  ? ?PCP: [name], MD  ? ?Patient coming from:  The patient is coming from home.  At baseline, pt is independent for most of ADL.       ? ?Chief Complaint: Cough, weakness ? ?HPI: Steve Bryant is a 88 y.o. male with medical history significant of hypertension, hyperlipidemia, COPD, asthma, stroke, GERD, hypothyroidism, dCHF, LAD, carotid artery stenosis, AAA, CKD-3A, SSS, bilateral eye blindness, who presents with cough and weakness. ? ?Per family members at the bedside, patient developed dry cough and generalized weakness today.  Mostly dry cough.  No significant shortness of breath.  Patient does not have chest pain.  Patient has chills and fever 100.6 at home.  His body temperature is 99.7 in the ED.  Patient does not have nausea, vomiting, diarrhea or abdominal pain.  No symptoms of UTI.  ? ?Patient had oxygen desaturation to 88% on room air initially, 2 L oxygen started, then improved.  Currently oxygen saturation 100% on room air.  Patient has hypotension with blood pressure 83/53, which improved to 112/68 after giving 3.25 L IV fluid bolus. ? ?Data Reviewed and ED Course: pt was found to have WBC 15.9, lactic acid of 1.6, INR 1.1, negative COVID PCR, worsening renal function, heart rate 86, RR 24, chest x-ray showed right-sided infiltration, worse on the right upper lobe.  Patient is admitted to PCU as inpatient ? ? ?EKG: I have personally reviewed.  Sinus rhythm, QTc 431, mild ST depression in the inferior leads, V4-V6. ? ? ?Review of Systems:  ? ?General: has fevers, chills, no body weight gain, has fatigue ?HEENT: No hearing changes or sore throat.  Has bilateral eye blindness ?Respiratory: no dyspnea, has coughing, no wheezing ?CV: no chest pain, no palpitations ?GI: no nausea, vomiting, abdominal pain, diarrhea, constipation ?GU: no dysuria, burning on urination, increased  urinary frequency, hematuria  ?Ext: has trace leg edema ?Neuro: no unilateral weakness, numbness, or tingling, no vision change or hearing loss ?Skin: no rash, no skin tear. ?MSK: No muscle spasm, no deformity, no limitation of range of movement in spin ?Heme: No easy bruising.  ?Travel history: No recent long distant travel. ? ? ?Allergy:  ?Allergies  ?Allergen Reactions  ? Shellfish-Derived Products Nausea Only  ?  Scallops  ? ? ?Past Medical History:  ?Diagnosis Date  ? AAA (abdominal aortic aneurysm) ([place])   ? a.) Abd [place] 12/22/16: measured 4.5 cm. b.) CTA 12/27/18: fusiform infrarenal AAA measured 4.7 cm. c.) Abd [place] 07/04/19: measured 5.0 cm. d.) CTA 11/03/19: measured 4.9 cm. e.) CTA 10/21/20: interval increase in size to 5.4 x 5.0 cm extending to the iliac bifurcation.  ? Acute ischemic stroke ([place]) 10/16/2016  ? a.) MRI --> 13 x 14 mm ischemic, non-hemorrhagic, RIGHT paramedian ventral pons territory  ? Aortic atherosclerosis ([place])   ? Arthritis   ? Bilateral renal cysts   ? Bradycardia   ? Carotid artery disease ([place])   ? a.) Doppler 07/02/09: RIGHT 50-75%, LEFT < 50%. b.) CTA 07/04/09: RIGHT 90%. LEFT 50-75%. c.) CTA 10/15/2016: RIGHT with no significant stenosis, LEFT 60%.  ? CHF (congestive heart failure) ([place])   ? Chronic anticoagulation   ? a.) DAPT therapy (ASA + clopidogrel)  ? Coronary artery disease   ? DDD (degenerative disc disease), lumbar   ? Diastolic dysfunction 53/97/6734  ? a.) TTE 04/16/20: EF 60-65%; G1DD.  ? DOE (dyspnea on  exertion)   ? Dyspnea   ? GERD (gastroesophageal reflux disease)   ? Glaucoma   ? Hyperlipidemia   ? Hypertension   ? Legally blind   ? Neck mass   ? a.) CT 07/04/09: 4.9 cm supraclavicular mass just posterior to clavicle; PET CT 07/05/09 showed mass to be FDG avid with max SUV of 6. b.) CT 10/15/16: measured 4 x 5 cm. c.) CT 04/15/20: measured 5.1 x 3.7 cm.  ? Pneumonia   ? 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 PVD (peripheral vascular disease) ([place])   ? Squamous cell  carcinoma of skin 06/15/2019  ? left distal medial pretibial. WD SCC. EDC  ? Squamous cell carcinoma of skin 06/15/2019  ? Right calf. KA-type. EDC  ? Squamous cell carcinoma of skin 11/13/2020  ? Right distal hand (in situ) - EDC  ? Squamous cell carcinoma of skin 11/13/2020  ? Right prox hand (in situ) - EDC  ? TIA (transient ischemic attack) 04/15/2020  ? Valvular regurgitation   ? a.) TTE 10/17/2016 : EF 55-60%; mild AR/MR. b.) TTE 04/16/2020: EF 60-65%; G1DD, mild AR, triv. MR. c.) TTE 12/09/20: EF >55%; mild AR/TR, triv. PR, mod. MR.  ? ? ?Past Surgical History:  ?Procedure Laterality Date  ? CERVICAL LAMINECTOMY N/A 05/2006  ? Fusion C3-C7  ? ENDOVASCULAR REPAIR/STENT GRAFT N/A 12/25/2020  ? Procedure: ENDOVASCULAR REPAIR/STENT GRAFT;  Surgeon: Algernon Huxley, MD;  Location: [facility];  Service: Cardiovascular;  Laterality: N/A;  ? GLAUCOMA SURGERY  2003  ? INGUINAL HERNIA REPAIR Right 2007  ? TUMOR REMOVAL Right 1977  ? Thoracotomy with RUL pulmonary mass resection  ? ? ?Social History:  reports that he quit smoking about 44 years ago. His smoking use included cigarettes. He has a 25.00 pack-year smoking history. He has never used smokeless tobacco. He reports that he does not currently use alcohol. He reports that he does not use drugs. ? ?Family History:  ?Family History  ?Problem Relation Age of Onset  ? Stroke Mother   ? Stroke Father   ? Cancer Brother   ?  ? ?Prior to Admission medications   ?Medication Sig Start Date End Date Taking? Authorizing Provider  ?aspirin EC 81 MG tablet Take 81 mg by mouth daily.   Yes [provider]  ?clopidogrel (PLAVIX) 75 MG tablet Take 75 mg by mouth daily. 06/09/18  Yes [provider]  ?dorzolamide (TRUSOPT) 2 % ophthalmic solution Place 1 drop into the right eye daily with lunch. 08/12/20  Yes [provider]  ?dorzolamide-timolol (COSOPT) 22.3-6.8 MG/ML ophthalmic solution Place 1 drop into both eyes 2 (two) times daily. 08/10/16  Yes  [provider]  ?gabapentin (NEURONTIN) 100 MG capsule Take 100 mg by mouth 2 (two) times daily with a meal. 05/09/19  Yes [provider]  ?latanoprost (XALATAN) 0.005 % ophthalmic solution Place 1 drop into the right eye at bedtime. 11/16/13  Yes [provider]  ?levothyroxine (SYNTHROID) 50 MCG tablet Take 50 mcg by mouth daily. 07/08/21  Yes [provider]  ?losartan (COZAAR) 25 MG tablet Take 1 tablet (25 mg total) by mouth daily. 10/16/16  Yes Gladstone Lighter, MD  ?Multiple Vitamin (MULTIVITAMIN) tablet Take 1 tablet by mouth daily.   Yes [provider]  ?MYRBETRIQ 25 MG TB24 tablet Take 25 mg by mouth daily. 05/05/21  Yes [provider]  ?pantoprazole (PROTONIX) 20 MG tablet Take 20 mg by mouth daily. 07/19/20  Yes [provider]  ?  pregabalin (LYRICA) 75 MG capsule Take 75 mg by mouth 3 (three) times daily with meals. 02/24/19  Yes [provider]  ?QUEtiapine (SEROQUEL) 25 MG tablet Take 25 mg by mouth 2 (two) times daily.   Yes [provider]  ?rosuvastatin (CRESTOR) 5 MG tablet Take 5 mg by mouth daily with supper.   Yes [provider]  ?risperiDONE (RISPERDAL) 0.5 MG tablet Take 1 tablet (0.5 mg total) by mouth 2 (two) times daily. ?Patient not taking: Reported on 07/10/2021 07/06/21 08/05/21  Merlyn Lot, MD  ? ? ?Physical Exam: ?Vitals:  ? 07/10/21 1315 07/10/21 1330 07/10/21 1345 07/10/21 1530  ?BP: 92/67 108/63 112/68 121/69  ?Pulse: 77 72 72 73  ?Resp: '15 16 15 17  '$ ?Temp:      ?TempSrc:      ?SpO2: 100% 100% 100% 100%  ?Weight:      ?Height:      ? ?General: Not in acute distress ?HEENT: ?      Eyes:  no scleral icterus. ?      ENT: No discharge from the ears and nose, no pharynx injection, no tonsillar enlargement.  ?      Neck: No JVD, no bruit, no mass felt. ?Heme: No neck lymph node enlargement. ?Cardiac: S1/S2, RRR, No gallops or rubs. ?Respiratory: No rales, wheezing, rhonchi or rubs. ?GI: Soft,  nondistended, nontender, no rebound pain, no organomegaly, BS present. ?GU: No hematuria ?Ext: has trace leg edema bilaterally. 1+DP/PT pulse bilaterally. ?Musculoskeletal: No joint deformities, No joint redness o

## 2021-07-10 NOTE — Assessment & Plan Note (Signed)
-   Aspirin, Plavix, Crestor 

## 2021-07-10 NOTE — ED Notes (Signed)
Messaged pharmacist about 2200 seroquel as patient just received seroquel at 1726. Pharmacist rescheduled 2200 dose of seroquel for 2300 ?

## 2021-07-10 NOTE — Assessment & Plan Note (Signed)
-   Continue aspirin, Plavix, Crestor 

## 2021-07-10 NOTE — ED Notes (Signed)
This RN attempted to draw blood from patient unsuccessfully. Lab called to collect blood work and informed them that pt is a septic work up. ?

## 2021-07-10 NOTE — Assessment & Plan Note (Addendum)
Blood pressure elevated.  Initial home Cozaar was held due to softer blood pressure.  AKI has been resolved. ?-Restart home Cozaar ?

## 2021-07-10 NOTE — Assessment & Plan Note (Addendum)
Patient meets critical for sepsis with WBC 15.9, RR 24.  Initially hypotensive with blood pressure 83/53, which responded to IV fluid resuscitation.  ? ?IVF: total of 3.25 L of bolus in ED, followed by 75 mL per hour of LR  ?Blood pressure trending up, stopping IV fluid now. ?Preliminary blood cultures negative, urine cultures pending but UA was not consistent with UTI. ? ?See A&P under CAP ?

## 2021-07-10 NOTE — Assessment & Plan Note (Signed)
2D echo on 04/16/2020 showed EF 60 to 65% with grade 1 diastolic dysfunction.  Patient has trace leg edema, no JVD.  CHF seem to be compensated. ?- Check BNP ?

## 2021-07-10 NOTE — Progress Notes (Signed)
Elink following Sepsis bundle. 

## 2021-07-11 DIAGNOSIS — J189 Pneumonia, unspecified organism: Secondary | ICD-10-CM | POA: Diagnosis not present

## 2021-07-11 DIAGNOSIS — A419 Sepsis, unspecified organism: Secondary | ICD-10-CM | POA: Diagnosis not present

## 2021-07-11 DIAGNOSIS — I5032 Chronic diastolic (congestive) heart failure: Secondary | ICD-10-CM | POA: Diagnosis not present

## 2021-07-11 DIAGNOSIS — N179 Acute kidney failure, unspecified: Secondary | ICD-10-CM | POA: Diagnosis not present

## 2021-07-11 LAB — CBC
HCT: 40.2 % (ref 39.0–52.0)
Hemoglobin: 13.1 g/dL (ref 13.0–17.0)
MCH: 28.3 pg (ref 26.0–34.0)
MCHC: 32.6 g/dL (ref 30.0–36.0)
MCV: 86.8 fL (ref 80.0–100.0)
Platelets: 161 10*3/uL (ref 150–400)
RBC: 4.63 MIL/uL (ref 4.22–5.81)
RDW: 16.3 % — ABNORMAL HIGH (ref 11.5–15.5)
WBC: 14.3 10*3/uL — ABNORMAL HIGH (ref 4.0–10.5)
nRBC: 0 % (ref 0.0–0.2)

## 2021-07-11 LAB — BASIC METABOLIC PANEL
Anion gap: 7 (ref 5–15)
BUN: 30 mg/dL — ABNORMAL HIGH (ref 8–23)
CO2: 22 mmol/L (ref 22–32)
Calcium: 8.6 mg/dL — ABNORMAL LOW (ref 8.9–10.3)
Chloride: 114 mmol/L — ABNORMAL HIGH (ref 98–111)
Creatinine, Ser: 1.01 mg/dL (ref 0.61–1.24)
GFR, Estimated: >60 mL/min (ref >60)
Glucose, Bld: 97 mg/dL (ref 70–99)
Potassium: 3.6 mmol/L (ref 3.5–5.1)
Sodium: 143 mmol/L (ref 135–145)

## 2021-07-11 MED ORDER — DORZOLAMIDE HCL 2 % OP SOLN
1.0000 [drp] | Freq: Every day | OPHTHALMIC | Status: DC
  Administered 2021-07-11: 1 [drp] via OPHTHALMIC
  Filled 2021-07-11: qty 10

## 2021-07-11 MED ORDER — LOSARTAN POTASSIUM 25 MG PO TABS
25.0000 mg | ORAL_TABLET | Freq: Every day | ORAL | Status: DC
  Administered 2021-07-11: 25 mg via ORAL
  Filled 2021-07-11: qty 1

## 2021-07-11 MED ORDER — DORZOLAMIDE HCL-TIMOLOL MAL 2-0.5 % OP SOLN
1.0000 [drp] | Freq: Two times a day (BID) | OPHTHALMIC | Status: DC
  Administered 2021-07-11 – 2021-07-12 (×3): 1 [drp] via OPHTHALMIC
  Filled 2021-07-11: qty 10

## 2021-07-11 MED ORDER — LATANOPROST 0.005 % OP SOLN
1.0000 [drp] | Freq: Every day | OPHTHALMIC | Status: DC
  Administered 2021-07-11: 1 [drp] via OPHTHALMIC
  Filled 2021-07-11: qty 2.5

## 2021-07-11 MED ORDER — POLYETHYLENE GLYCOL 3350 17 G PO PACK
17.0000 g | PACK | Freq: Every day | ORAL | Status: DC
  Administered 2021-07-11 – 2021-07-12 (×2): 17 g via ORAL
  Filled 2021-07-11 (×2): qty 1

## 2021-07-11 NOTE — Progress Notes (Signed)
Medications (3 bottles of eye drops) returned to patient and daughter at bedside.  Eye drops reviewed by [facility] [place] pharmacy for hospital use.  Eye drops kept at bedside.   ?

## 2021-07-11 NOTE — Evaluation (Signed)
Physical Therapy Evaluation ?Patient Details ?Name: Steve Bryant ?MRN: [id number] ?DOB: [birth year] ?Today's Date: 07/11/2021 ? ?History of Present Illness ? Pt is a 88 y/o M admitted on 07/10/21 after presenting with cough & weakness. Pt is being treated for sepsis 2/2 CAP. PMH: HTN, HLD, COPD, asthma, stroke, GERD, hypothyroidism, dCHF, LAD, carotid artery stenosis, AAA, CKD3A, SSS, B eye blindness  ?Clinical Impression ? Pt seen for PT evaluation with pt agreeable to tx & pt's daughter [name]) present for session. Pt received in handoff from OT. Prior to admission pt was living alone with PRN assistance on the main level of a 2 level home with 2 steps with L rail to enter the house. Pt ambulates with a rollator at home. On this date, pt is able to ambulate short distances with RW & CGA<>Min assist with frequent cuing for path finding/safety 2/2 pt's visual deficits. Pt's daughter is attempting to coordinate 24 hr assistance at d/c but at this time pt does not have it. Pt would benefit from STR upon d/c to maximize independence with functional mobility & reduce fall risk prior to return home. ?   ? ?Recommendations for follow up therapy are one component of a multi-disciplinary discharge planning process, led by the attending physician.  Recommendations may be updated based on patient status, additional functional criteria and insurance authorization. ? ?Follow Up Recommendations Skilled nursing-short term rehab (<3 hours/day) ? ?  ?Assistance Recommended at Discharge Frequent or constant Supervision/Assistance  ?Patient can return home with the following ? A little help with walking and/or transfers;A little help with bathing/dressing/bathroom;Assistance with cooking/housework;Assist for transportation;Direct supervision/assist for financial management;Assistance with feeding;Direct supervision/assist for medications management;Help with stairs or ramp for entrance ? ?  ?Equipment Recommendations None recommended  by PT (pt already has rollator)  ?Recommendations for Other Services ?    ?  ?Functional Status Assessment Patient has had a recent decline in their functional status and demonstrates the ability to make significant improvements in function in a reasonable and predictable amount of time.  ? ?  ?Precautions / Restrictions Precautions ?Precautions: Fall ?Precaution Comments: legally blind ?Restrictions ?Weight Bearing Restrictions: No  ? ?  ? ?Mobility ? Bed Mobility ?  ?  ?  ?  ?  ?  ?  ?General bed mobility comments: pt received & left in recliner ?  ? ?Transfers ?Overall transfer level: Needs assistance ?Equipment used: Rolling walker (2 wheels) ?Transfers: Sit to/from Stand ?Sit to Stand: Min guard ?  ?  ?  ?  ?  ?General transfer comment: cuing for safe hand placement & to locate RW as pt is legally blind ?  ? ?Ambulation/Gait ?Ambulation/Gait assistance: Min assist, Min guard ?Gait Distance (Feet): 35 Feet ?Assistive device: Rolling walker (2 wheels) ?Gait Pattern/deviations: Decreased dorsiflexion - right, Decreased dorsiflexion - left, Decreased step length - right, Decreased step length - left ?Gait velocity: decreased ?  ?  ?General Gait Details: Pt provides mulitmodal cuing for steering/pathfinding 2/2 legally blind (pt appears to see some shadows during session). ? ?Stairs ?  ?  ?  ?  ?  ? ?Wheelchair Mobility ?  ? ?Modified Rankin (Stroke Patients Only) ?  ? ?  ? ?Balance Overall balance assessment: Needs assistance ?Sitting-balance support: Feet supported ?Sitting balance-Leahy Scale: Fair ?  ?  ?Standing balance support: Bilateral upper extremity supported, During functional activity ?Standing balance-Leahy Scale: Fair ?  ?  ?  ?  ?  ?  ?  ?  ?  ?  ?  ?  ?   ? ? ? ?  Pertinent Vitals/Pain Pain Assessment ?Pain Assessment: No/denies pain  ? ? ?Home Living Family/patient expects to be discharged to:: Private residence ?Living Arrangements: Alone ?Available Help at Discharge: Family;Available  PRN/intermittently;Personal care attendant ?Type of Home: House ?Home Access: Stairs to enter ?Entrance Stairs-Rails: Left ?Entrance Stairs-Number of Steps: 2 ?  ?Home Layout: Two level;Able to live on main level with bedroom/bathroom ?Home Equipment: Rollator (4 wheels) ?   ?  ?Prior Function Prior Level of Function : Independent/Modified Independent ?  ?  ?  ?  ?  ?  ?Mobility Comments: household mobility ?ADLs Comments: neice in 3x/week half days to assist for cooking/cleaning (pt can do microwave meals) ?  ? ? ?Hand Dominance  ?   ? ?  ?Extremity/Trunk Assessment  ? Upper Extremity Assessment ?Upper Extremity Assessment: Overall WFL for tasks assessed ?  ? ?Lower Extremity Assessment ?Lower Extremity Assessment: Generalized weakness;Overall [facility] for tasks assessed ?  ? ?   ?Communication  ? Communication: HOH  ?Cognition Arousal/Alertness: Awake/alert ?Behavior During Therapy: [facility] for tasks assessed/performed ?Overall Cognitive Status: Difficult to assess ?  ?  ?  ?  ?  ?  ?  ?  ?  ?  ?  ?  ?  ?  ?  ?  ?General Comments: Pt's daughter [name]) present, reporting pt's cognition waxes & wanes & this isn't new. Reports pt has visual hallucinations 2/2 blindness. Reports pt was able to manage medications up to a week prior to admission. ?  ?  ? ?  ?General Comments   ? ?  ?Exercises    ? ?Assessment/Plan  ?  ?PT Assessment Patient needs continued PT services  ?PT Problem List Decreased strength;Decreased mobility;Decreased safety awareness;Decreased activity tolerance;Decreased cognition;Decreased balance;Decreased knowledge of use of DME ? ?   ?  ?PT Treatment Interventions DME instruction;Therapeutic activities;Modalities;Gait training;Therapeutic exercise;Patient/family education;Stair training;Balance training;Functional mobility training;Neuromuscular re-education;Manual techniques   ? ?PT Goals (Current goals can be found in the Care Plan section)  ?Acute Rehab PT Goals ?Patient Stated Goal: get better ?PT Goal  Formulation: With patient/family ?Time For Goal Achievement: 07/25/21 ?Potential to Achieve Goals: Good ? ?  ?Frequency Min 2X/week ?  ? ? ?Co-evaluation   ?  ?  ?  ?  ? ? ?  ?AM-PAC PT "6 Clicks" Mobility  ?Outcome Measure Help needed turning from your back to your side while in a flat bed without using bedrails?: None ?Help needed moving from lying on your back to sitting on the side of a flat bed without using bedrails?: A Little ?Help needed moving to and from a bed to a chair (including a wheelchair)?: A Little ?Help needed standing up from a chair using your arms (e.g., wheelchair or bedside chair)?: A Little ?Help needed to walk in hospital room?: A Little ?Help needed climbing 3-5 steps with a railing? : A Lot ?6 Click Score: 18 ? ?  ?End of Session Equipment Utilized During Treatment: Gait belt ?Activity Tolerance: Patient tolerated treatment well ?Patient left: in chair;with chair alarm set;with call bell/phone within reach;with family/visitor present;with nursing/sitter in room ?Nurse Communication: Mobility status ?PT Visit Diagnosis: Unsteadiness on feet (R26.81);Muscle weakness (generalized) (M62.81) ?  ? ?Time: 7494-4967 ?PT Time Calculation (min) (ACUTE ONLY): 16 min ? ? ?Charges:   PT Evaluation ?$PT Eval Moderate Complexity: 1 Mod ?  ?  ?   ? ?Lavone Nian, PT, DPT ?07/11/21, 11:53 AM ? ? ?Waunita Schooner ?07/11/2021, 11:51 AM ? ?

## 2021-07-11 NOTE — Assessment & Plan Note (Signed)
Baseline creatinine 1.12 on 07/06/2021.  His creatinine is 1.47, BUN 45 on admission, now improved to 1.01 with IV fluid. ?-Restarting Cozaar ?-Stop IV fluid ?-Encourage p.o. hydration ?

## 2021-07-11 NOTE — Evaluation (Signed)
Occupational Therapy Evaluation ?Patient Details ?Name: Steve Bryant ?MRN: [id number] ?DOB: [birth year] ?Today's Date: 07/11/2021 ? ? ?History of Present Illness Pt is a 88 y/o M admitted on 07/10/21 after presenting with cough & weakness. Pt is being treated for sepsis 2/2 CAP. PMH: HTN, HLD, COPD, asthma, stroke, GERD, hypothyroidism, dCHF, LAD, carotid artery stenosis, AAA, CKD3A, SSS, B eye blindness  ? ?Clinical Impression ?  ?Mr Faro was seen for OT evaluation this date. Prior to hospital admission, pt was MOD I for mobility and ADLs using 4WW. Pt lives alone with PRN assistance from family/aids. Pt presents to acute OT demonstrating impaired ADL performance and functional mobility 2/2 decreased activity tolerance and functional strength/ROM/balance deficits.  ? ?Pt currently requires MOD A don B shoes seated EOB. MIN A + RW for ADL t/f - assist to manage RW. Pt would benefit from skilled OT to address noted impairments and functional limitations (see below for any additional details). Upon hospital discharge, recommend STR to maximize pt safety and return to PLOF, may d/c home with 24/7 assistance as pt likely will do better in familiar environment 2/2 blindness.   ? ?Recommendations for follow up therapy are one component of a multi-disciplinary discharge planning process, led by the attending physician.  Recommendations may be updated based on patient status, additional functional criteria and insurance authorization.  ? ?Follow Up Recommendations ? Skilled nursing-short term rehab (<3 hours/day)  ?  ?Assistance Recommended at Discharge Frequent or constant Supervision/Assistance  ?Patient can return home with the following A lot of help with walking and/or transfers;A lot of help with bathing/dressing/bathroom;Help with stairs or ramp for entrance;Assistance with cooking/housework ? ?  ?Functional Status Assessment ? Patient has had a recent decline in their functional status and demonstrates the ability  to make significant improvements in function in a reasonable and predictable amount of time.  ?Equipment Recommendations ? BSC/3in1  ?  ?Recommendations for Other Services   ? ? ?  ?Precautions / Restrictions Precautions ?Precautions: Fall ?Precaution Comments: legally blind ?Restrictions ?Weight Bearing Restrictions: No  ? ?  ? ?Mobility Bed Mobility ?Overal bed mobility: Needs Assistance ?Bed Mobility: Supine to Sit ?  ?  ?Supine to sit: Min assist ?  ?  ?  ?  ? ?Transfers ?Overall transfer level: Needs assistance ?Equipment used: Rolling walker (2 wheels) ?Transfers: Sit to/from Stand ?Sit to Stand: Min assist ?  ?  ?  ?  ?  ?General transfer comment: CGA from chair, MIN A from bed. ?  ? ?  ?Balance Overall balance assessment: Needs assistance ?Sitting-balance support: No upper extremity supported, Feet supported ?Sitting balance-Leahy Scale: Good ?  ?  ?Standing balance support: Bilateral upper extremity supported, During functional activity ?Standing balance-Leahy Scale: Fair ?  ?  ?  ?  ?  ?  ?  ?  ?  ?  ?  ?  ?   ? ?ADL either performed or assessed with clinical judgement  ? ?ADL Overall ADL's : Needs assistance/impaired ?  ?  ?  ?  ?  ?  ?  ?  ?  ?  ?  ?  ?  ?  ?  ?  ?  ?  ?  ?General ADL Comments: MOD A don B shoes seated EOB. MIN A + RW for ADL t/f - assist to manage RW.  ? ? ? ? ?Pertinent Vitals/Pain Pain Assessment ?Pain Assessment: No/denies pain  ? ? ? ?Hand Dominance   ?  ?Extremity/Trunk Assessment Upper  Extremity Assessment ?Upper Extremity Assessment: Overall WFL for tasks assessed ?  ?Lower Extremity Assessment ?Lower Extremity Assessment: Generalized weakness;Overall [facility] for tasks assessed ?  ?  ?  ?Communication Communication ?Communication: HOH ?  ?Cognition Arousal/Alertness: Awake/alert ?Behavior During Therapy: [facility] for tasks assessed/performed ?Overall Cognitive Status: Impaired/Different from baseline ?Area of Impairment: Problem solving ?  ?  ?  ?  ?  ?  ?  ?  ?  ?  ?  ?  ?  ?  ?Problem  Solving: Slow processing, Decreased initiation, Difficulty sequencing, Requires verbal cues, Requires tactile cues ?General Comments: pt's daughter reports ?  ?  ?   ?   ?   ? ? ?Home Living Family/patient expects to be discharged to:: Private residence ?Living Arrangements: Alone ?Available Help at Discharge: Family;Available PRN/intermittently;Personal care attendant ?Type of Home: House ?Home Access: Stairs to enter ?Entrance Stairs-Number of Steps: 2 ?Entrance Stairs-Rails: Left ?Home Layout: Two level;Able to live on main level with bedroom/bathroom ?  ?  ?Bathroom Shower/Tub: Walk-in shower ?  ?  ?  ?  ?Home Equipment: Rollator (4 wheels) ?  ?  ?  ? ?  ?Prior Functioning/Environment Prior Level of Function : Independent/Modified Independent ?  ?  ?  ?  ?  ?  ?Mobility Comments: household mobility ?ADLs Comments: neice in 3x/week half days to assist for cooking/cleaning (pt can do microwave meals) ?  ? ?  ?  ?OT Problem List: Decreased strength;Decreased range of motion;Decreased activity tolerance;Impaired balance (sitting and/or standing);Decreased safety awareness ?  ?   ?OT Treatment/Interventions: Self-care/ADL training;Therapeutic exercise;Energy conservation;DME and/or AE instruction;Therapeutic activities;Balance training;Patient/family education  ?  ?OT Goals(Current goals can be found in the care plan section) Acute Rehab OT Goals ?Patient Stated Goal: to go home ?OT Goal Formulation: With patient/family ?Time For Goal Achievement: 07/25/21 ?Potential to Achieve Goals: Good ?ADL Goals ?Pt Will Perform Grooming: with modified independence;standing ?Pt Will Perform Lower Body Dressing: with modified independence;sit to/from stand ?Pt Will Transfer to Toilet: with modified independence;ambulating;regular height toilet  ?OT Frequency: Min 2X/week ?  ? ?Co-evaluation   ?  ?  ?  ?  ? ?  ?AM-PAC OT "6 Clicks" Daily Activity     ?Outcome Measure Help from another person eating meals?: A Little ?Help from  another person taking care of personal grooming?: A Little ?Help from another person toileting, which includes using toliet, bedpan, or urinal?: A Little ?Help from another person bathing (including washing, rinsing, drying)?: A Lot ?Help from another person to put on and taking off regular upper body clothing?: A Little ?Help from another person to put on and taking off regular lower body clothing?: A Lot ?6 Click Score: 16 ?  ?End of Session Equipment Utilized During Treatment: Gait belt;Rolling walker (2 wheels) ?Nurse Communication: Mobility status ? ?Activity Tolerance: Patient tolerated treatment well ?Patient left: in chair;with call bell/phone within reach (PT in room) ? ?OT Visit Diagnosis: Other abnormalities of gait and mobility (R26.89);Muscle weakness (generalized) (M62.81)  ?              ?Time: 0865-7846 ?OT Time Calculation (min): 32 min ?Charges:  OT General Charges ?$OT Visit: 1 Visit ?OT Evaluation ?$OT Eval Moderate Complexity: 1 Mod ?OT Treatments ?$Self Care/Home Management : 8-22 mins ? ?Dessie Coma, M.S. OTR/L  ?07/11/21, 1:36 PM  ?ascom [phone] ? ?

## 2021-07-11 NOTE — Hospital Course (Addendum)
Taken from H&P. ? ?Steve Bryant is a 88 y.o. male with medical history significant of hypertension, hyperlipidemia, COPD, asthma, stroke, GERD, hypothyroidism, dCHF, LAD, carotid artery stenosis, AAA, CKD-3A, SSS, bilateral eye blindness, who presents with cough and weakness. ?  ?Per family members at the bedside, patient developed dry cough and generalized weakness today.  Mostly dry cough.  No significant shortness of breath.  Patient does not have chest pain.  Patient has chills and fever 100.6 at home.  His body temperature is 99.7 in the ED.  Patient does not have nausea, vomiting, diarrhea or abdominal pain.  No symptoms of UTI.  ?  ?Patient had oxygen desaturation to 88% on room air initially, 2 L oxygen started, then improved.  Currently oxygen saturation 100% on room air.  Patient has hypotension with blood pressure 83/53, which improved to 112/68 after giving 3.25 L IV fluid bolus. ?  ?pt was found to have WBC 15.9, lactic acid of 1.6, INR 1.1, negative COVID PCR, worsening renal function, heart rate 86, RR 24, chest x-ray showed right-sided infiltration, worse on the right upper lobe. ? ?Patient met sepsis criteria with leukocytosis, tachypnea.  Hypotensive which responded well to IV fluid.  Mild AKI.  Secondary to community-acquired pneumonia. ?He was started on ceftriaxone and Zithromax. ? ?5/5: Procalcitonin at 1.44, UA was not consistent with UTI.  BNP mildly elevated at 101, preliminary blood cultures negative.  Strep pneumo antigen negative, Legionella pending. ? ?PT/OT suggested SNF but patient and family would like to go back home, they will arrange private caregivers for further help. ?Seems improving today.   ? ?5/6: Patient remained stable.  Had a good night but was becoming little more hallucinated in the morning.  He was discharged on 3 more days of Zithromax and Augmentin. ?We also discontinue Lyrica as he was on gabapentin and Lyrica both. ?Discussed with son at bedside. ? ?Patient  will continue the rest of his home medications and follow-up with his providers. ?

## 2021-07-11 NOTE — Progress Notes (Signed)
Pt oriented to person - he stated the time was March 2024 and he stated that his son is taking him to a MD appt today.  Attempted to re-orient patient.  Per granddaughter at bedside, pt has been hallucinating and believed he had people at his bedside overnight.  Per family at bedside, pts hallucinations have occurred since last Friday and pt has a history of being disoriented. ?

## 2021-07-11 NOTE — Progress Notes (Addendum)
?Progress Note ? ? ?Patient: Steve Bryant GYJ:[id number] DOB: 01/02/1926 DOA: 07/10/2021     1 ?DOS: the patient was seen and examined on 07/11/2021 ?  ?Brief hospital course: ?Taken from H&P. ? ?Steve Bryant is a 88 y.o. male with medical history significant of hypertension, hyperlipidemia, COPD, asthma, stroke, GERD, hypothyroidism, dCHF, LAD, carotid artery stenosis, AAA, CKD-3A, SSS, bilateral eye blindness, who presents with cough and weakness. ?  ?Per family members at the bedside, patient developed dry cough and generalized weakness today.  Mostly dry cough.  No significant shortness of breath.  Patient does not have chest pain.  Patient has chills and fever 100.6 at home.  His body temperature is 99.7 in the ED.  Patient does not have nausea, vomiting, diarrhea or abdominal pain.  No symptoms of UTI.  ?  ?Patient had oxygen desaturation to 88% on room air initially, 2 L oxygen started, then improved.  Currently oxygen saturation 100% on room air.  Patient has hypotension with blood pressure 83/53, which improved to 112/68 after giving 3.25 L IV fluid bolus. ?  ?pt was found to have WBC 15.9, lactic acid of 1.6, INR 1.1, negative COVID PCR, worsening renal function, heart rate 86, RR 24, chest x-ray showed right-sided infiltration, worse on the right upper lobe. ? ?Patient met sepsis criteria with leukocytosis, tachypnea.  Hypotensive which responded well to IV fluid.  Mild AKI.  Secondary to community-acquired pneumonia. ?He was started on ceftriaxone and Zithromax. ? ?5/5: Procalcitonin at 1.44, UA was not consistent with UTI.  BNP mildly elevated at 101, preliminary blood cultures negative.  Strep pneumo antigen negative, Legionella pending. ? ?PT/OT suggested SNF but patient and family would like to go back home, they will arrange private caregivers for further help. ?Seems improving today.  If remain stable and continue to improve, will be discharged tomorrow. ? ? ?Assessment and Plan: ?* Sepsis  ([place]) ?Patient meets critical for sepsis with WBC 15.9, RR 24.  Initially hypotensive with blood pressure 83/53, which responded to IV fluid resuscitation.  ? ?IVF: total of 3.25 L of bolus in ED, followed by 75 mL per hour of LR  ?Blood pressure trending up, stopping IV fluid now. ?Preliminary blood cultures negative, urine cultures pending but UA was not consistent with UTI. ? ?See A&P under CAP ? ?CAP (community acquired pneumonia) ?Sepsis due to CAP: Patient meets critical for sepsis with WBC 15.9, RR 24.  Initially hypotensive with blood pressure 83/53, which responded to IV fluid resuscitation.  Lactic acid 1.6. ?Clinically seems improving.  Preliminary blood cultures remain negative.  Procalcitonin at 1.44.  UA was not consistent with UTI and urine cultures pending. ?Strep pneumo negative, Legionella pending ?-Continue IV Rocephin and azithromycin ?- Mucinex for cough  ? -Bronchodilators ?-Follow-up urine legionella ? ? ? ?COPD (chronic obstructive pulmonary disease) ([place]) ?No wheezing or rhonchi on auscultation ?- Bronchodilators ? ?Acute renal failure superimposed on stage 3a chronic kidney disease ([place]) ?Baseline creatinine 1.12 on 07/06/2021.  His creatinine is 1.47, BUN 45 on admission, now improved to 1.01 with IV fluid. ?-Restarting Cozaar ?-Stop IV fluid ?-Encourage p.o. hydration ? ?Stroke [facility]) ?- Continue aspirin, Plavix, Crestor ? ?Asthma ?No wheezing or rhonchi on auscultation ?- Bronchodilators ? ?Hyperlipidemia ?- Crestor ? ?Chronic diastolic CHF (congestive heart failure) ([place]) ? 2D echo on 04/16/2020 showed EF 60 to 65% with grade 1 diastolic dysfunction.  Patient has trace leg edema, no JVD.  CHF seem to be compensated. ?- Check BNP ? ?HTN (  hypertension) ?Blood pressure elevated.  Initial home Cozaar was held due to softer blood pressure.  AKI has been resolved. ?-Restart home Cozaar ? ?Hypothyroidism ?- Synthroid ? ?Depression ?- Continue home medications ? ?CAD (coronary artery disease) ?-  Aspirin, Plavix, Crestor ? ? ?Subjective: Patient was sitting comfortably in chair and eating lunch when seen today.  Son and daughter at bedside.  Thinking that he has improved.  Cough improved.  Denies any pain. ?He does not want to go to any rehab and would like to return home with his private caretakers. ? ?Physical Exam: ?Vitals:  ? 07/11/21 0445 07/11/21 0500 07/11/21 0737 07/11/21 1251  ?BP: (!) 166/76  (!) 188/91 (!) 191/80  ?Pulse: 90  96 69  ?Resp: 18  16 16   ?Temp: 98.9 ?F (37.2 ?C)  98.7 ?F (37.1 ?C) 98.7 ?F (37.1 ?C)  ?TempSrc: Oral  Oral   ?SpO2: 94%  95% 97%  ?Weight:  71.6 kg    ?Height:      ? ?General.  Frail and blind elderly man, in no acute distress. ?Pulmonary.  Lungs clear bilaterally, normal respiratory effort. ?CV.  Regular rate and rhythm, no JVD, rub or murmur. ?Abdomen.  Soft, nontender, nondistended, BS positive. ?CNS.  Alert and oriented .  No focal neurologic deficit. ?Extremities.  No edema, no cyanosis, pulses intact and symmetrical. ?Psychiatry.  Judgment and insight appears normal. ? ?Data Reviewed: ?Notes, labs and images reviewed ? ?Family Communication: Discussed with son and daughter at bedside ? ?Disposition: ?Status is: Inpatient ?Remains inpatient appropriate because: Severity of illness ? ? Planned Discharge Destination: Home ? ?DVT prophylaxis.  Lovenox ?Time spent: 45 minutes ? ?This record has been created using Systems analyst. Errors have been sought and corrected,but may not always be located. Such creation errors do not reflect on the standard of care. ? ?Author: ?Lorella Nimrod, MD ?07/11/2021 2:02 PM ? ?For on call review [email or web address].  ?

## 2021-07-11 NOTE — TOC Initial Note (Signed)
Transition of Care (TOC) - Initial/Assessment Note  ? ? ?Patient Details  ?Name: Steve Bryant ?MRN: [id number] ?Date of Birth: 02/21/1926 ? ?Transition of Care (TOC) CM/SW Contact:    ? C , LCSW ?Phone Number: ?07/11/2021, 1:57 PM ? ?Clinical Narrative: Met with patient. Son at bedside. CSW introduced role and explained that therapy recommendations would be discussed. They are not interested in SNF or home health. Son said they have secured 24/7 supervision. They will call patient's PCP if they change their mind about home health. Son said they have all needed DME. No further concerns. CSW encouraged patient and his son to contact CSW as needed. CSW will continue to follow patient for support and facilitate discharge home once stable.                ? ?Expected Discharge Plan: Home/Self Care ?Barriers to Discharge: Continued Medical Work up ? ? ?Patient Goals and CMS Choice ?  ?  ?  ? ?Expected Discharge Plan and Services ?Expected Discharge Plan: Home/Self Care ?  ?  ?Post Acute Care Choice: NA ?Living arrangements for the past 2 months: Single Family Home ?                ?  ?  ?  ?  ?  ?  ?  ?  ?  ?  ? ?Prior Living Arrangements/Services ?Living arrangements for the past 2 months: Single Family Home ?Lives with:: Self ?Patient language and need for interpreter reviewed:: Yes ?Do you feel safe going back to the place where you live?: Yes      ?Need for Family Participation in Patient Care: Yes (Comment) ?Care giver support system in place?: Yes (comment) ?  ?Criminal Activity/Legal Involvement Pertinent to Current Situation/Hospitalization: No - Comment as needed ? ?Activities of Daily Living ?Home Assistive Devices/Equipment: Walker (specify type) ?ADL Screening (condition at time of admission) ?Patient's cognitive ability adequate to safely complete daily activities?: Yes ?Is the patient deaf or have difficulty hearing?: No ?Does the patient have difficulty seeing, even when wearing  glasses/contacts?: Yes ?Does the patient have difficulty concentrating, remembering, or making decisions?: Yes ?Patient able to express need for assistance with ADLs?: Yes ?Does the patient have difficulty dressing or bathing?: Yes ?Independently performs ADLs?: No ?Communication: Independent ?Dressing (OT): Needs assistance ?Grooming: Needs assistance ?Feeding: Needs assistance ?Bathing: Needs assistance ?Toileting: Needs assistance ?In/Out Bed: Needs assistance ?Walks in Home: Needs assistance ?Does the patient have difficulty walking or climbing stairs?: Yes ?Weakness of Legs: Both ?Weakness of Arms/Hands: Both ? ?Permission Sought/Granted ?Permission sought to share information with : Family Supports ?  ?   ?   ? Permission granted to share info w Relationship: Son at bedside ?   ? ?Emotional Assessment ?Appearance:: Appears stated age ?Attitude/Demeanor/Rapport: Engaged ?Affect (typically observed): Appropriate, Calm, Pleasant ?Orientation: : Oriented to Self (Per last RN assessment this morning, AOx1. During TOC assessment seemed oriented.) ?Alcohol / Substance Use: Not Applicable ?Psych Involvement: No (comment) ? ?Admission diagnosis:  CAP (community acquired pneumonia) [J18.9] ?Community acquired pneumonia of right lung, unspecified part of lung [J18.9] ?Sepsis without acute organ dysfunction, due to unspecified organism (HCC) [A41.9] ?Patient Active Problem List  ? Diagnosis Date Noted  ? CAP (community acquired pneumonia) 07/10/2021  ? Sepsis (HCC) 07/10/2021  ? COPD (chronic obstructive pulmonary disease) (HCC) 07/10/2021  ? Chronic diastolic CHF (congestive heart failure) (HCC) 07/10/2021  ? HTN (hypertension) 07/10/2021  ? Hypothyroidism 07/10/2021  ? Depression 07/10/2021  ? CAD (coronary artery   disease) 07/10/2021  ? Acute renal failure superimposed on stage 3a chronic kidney disease (HCC) 07/10/2021  ? SOBOE (shortness of breath on exertion) 11/13/2020  ? Pneumonia 05/28/2020  ? Frequent falls  05/28/2020  ? Acute respiratory failure (HCC) 05/28/2020  ? Aphasia   ? Stenosis of left carotid artery   ? Subclavian vein stenosis, right   ? TIA (transient ischemic attack) 04/15/2020  ? Osteoarthritis of both hips 09/28/2019  ? Hallucinations, visual 02/09/2019  ? Hyperlipidemia 11/04/2016  ? CVA (cerebral vascular accident) (HCC) 10/15/2016  ? Essential hypertension, benign 09/18/2016  ? Stroke (HCC) 09/18/2016  ? AAA (abdominal aortic aneurysm) without rupture (HCC) 09/18/2016  ? Gait instability 08/26/2016  ? History of COPD 11/22/2013  ? SSS (sick sinus syndrome) (HCC) 11/22/2013  ? DDD (degenerative disc disease), lumbar 10/02/2013  ? Lumbar radiculitis 10/02/2013  ? Arthritis 05/18/2013  ? Asthma 05/18/2013  ? Degenerative joint disease of cervical and lumbar spine 05/18/2013  ? Peripheral neuropathy 05/18/2013  ? Renal insufficiency 05/18/2013  ? ?PCP:  Bronstein, David, MD ?Pharmacy:   ?[id number] - [place], [place] - [address] ?[address] ?[place] [address] ?Phone: [phone] Fax: [phone] ? ? ? ? ?Social Determinants of Health (SDOH) Interventions ?  ? ?Readmission Risk Interventions ?   ? View : No data to display.  ?  ?  ?  ? ? ? ?

## 2021-07-12 DIAGNOSIS — N1831 Chronic kidney disease, stage 3a: Secondary | ICD-10-CM | POA: Diagnosis not present

## 2021-07-12 DIAGNOSIS — N179 Acute kidney failure, unspecified: Secondary | ICD-10-CM | POA: Diagnosis not present

## 2021-07-12 DIAGNOSIS — J189 Pneumonia, unspecified organism: Secondary | ICD-10-CM | POA: Diagnosis not present

## 2021-07-12 DIAGNOSIS — A419 Sepsis, unspecified organism: Secondary | ICD-10-CM | POA: Diagnosis not present

## 2021-07-12 MED ORDER — AZITHROMYCIN 250 MG PO TABS: ORAL_TABLET | ORAL | 0 refills | Status: AC

## 2021-07-12 MED ORDER — AMOXICILLIN-POT CLAVULANATE 875-125 MG PO TABS: 1.0000 | ORAL_TABLET | Freq: Two times a day (BID) | ORAL | 0 refills | Status: AC

## 2021-07-12 MED ORDER — DM-GUAIFENESIN ER 30-600 MG PO TB12: 1.0000 | ORAL_TABLET | Freq: Two times a day (BID) | ORAL | 0 refills | Status: DC | PRN

## 2021-07-12 MED ORDER — LOSARTAN POTASSIUM 50 MG PO TABS
50.0000 mg | ORAL_TABLET | Freq: Every day | ORAL | Status: DC
  Administered 2021-07-12: 50 mg via ORAL
  Filled 2021-07-12: qty 1

## 2021-07-12 NOTE — Progress Notes (Signed)
Steve Bryant to be D/C'd Home per MD order.  Discussed prescriptions and follow up appointments with the patient and son. Prescriptions electronically submitted medication list explained in detail. Son verbalized understanding. ? ?Allergies as of 07/12/2021   ? ?   Reactions  ? Shellfish-derived Products Nausea Only  ? Scallops  ? ?  ? ?  ?Medication List  ?  ? ?STOP taking these medications   ? ?pregabalin 75 MG capsule ?Commonly known as: LYRICA ?  ?risperiDONE 0.5 MG tablet ?Commonly known as: RisperDAL ?  ? ?  ? ?TAKE these medications   ? ?amoxicillin-clavulanate 875-125 MG tablet ?Commonly known as: AUGMENTIN ?Take 1 tablet by mouth 2 (two) times daily for 3 days. ?  ?aspirin EC 81 MG tablet ?Take 81 mg by mouth daily. ?  ?azithromycin 250 MG tablet ?Commonly known as: Zithromax Z-Pak ?Take 1 tablet daily for the next 3 days ?  ?clopidogrel 75 MG tablet ?Commonly known as: PLAVIX ?Take 75 mg by mouth daily. ?  ?dextromethorphan-guaiFENesin 30-600 MG 12hr tablet ?Commonly known as: [place] DM ?Take 1 tablet by mouth 2 (two) times daily as needed for cough. ?  ?dorzolamide 2 % ophthalmic solution ?Commonly known as: TRUSOPT ?Place 1 drop into the right eye daily with lunch. ?  ?dorzolamide-timolol 22.3-6.8 MG/ML ophthalmic solution ?Commonly known as: COSOPT ?Place 1 drop into both eyes 2 (two) times daily. ?  ?gabapentin 100 MG capsule ?Commonly known as: NEURONTIN ?Take 100 mg by mouth 2 (two) times daily with a meal. ?  ?latanoprost 0.005 % ophthalmic solution ?Commonly known as: XALATAN ?Place 1 drop into the right eye at bedtime. ?  ?levothyroxine 50 MCG tablet ?Commonly known as: SYNTHROID ?Take 50 mcg by mouth daily. ?  ?losartan 25 MG tablet ?Commonly known as: COZAAR ?Take 1 tablet (25 mg total) by mouth daily. ?  ?multivitamin tablet ?Take 1 tablet by mouth daily. ?  ?Myrbetriq 25 MG Tb24 tablet ?Generic drug: mirabegron ER ?Take 25 mg by mouth daily. ?  ?pantoprazole 20 MG tablet ?Commonly known  as: PROTONIX ?Take 20 mg by mouth daily. ?  ?QUEtiapine 25 MG tablet ?Commonly known as: SEROQUEL ?Take 25 mg by mouth 2 (two) times daily. ?  ?rosuvastatin 5 MG tablet ?Commonly known as: CRESTOR ?Take 5 mg by mouth daily with supper. ?  ? ?  ? ? ?Vitals:  ? 07/12/21 0758 07/12/21 1103  ?BP: (!) 190/92 (!) 167/82  ?Pulse: 91 81  ?Resp: 16 16  ?Temp: 97.7 ?F (36.5 ?C) 97.9 ?F (36.6 ?C)  ?SpO2: 95% 96%  ? ? ?Skin clean, dry and intact without evidence of skin break down, no evidence of skin tears noted. IV catheter discontinued intact. Site without signs and symptoms of complications. Dressing and pressure applied. Pt denies pain at this time. No complaints noted. Home medications returned to patient ? ?An After Visit Summary was printed and given to the patient. ?Patient escorted via [place], and D/C home via private auto. ? ?[place]  ?

## 2021-07-12 NOTE — Discharge Summary (Signed)
?Physician Discharge Summary ?  ?Patient: Steve Bryant MRN: [id number] DOB: 09/01/1925  ?Admit date:     07/10/2021  ?Discharge date: 07/12/21  ?Discharge Physician:    ? ?PCP: Bronstein, David, MD  ? ?Recommendations at discharge:  ?Obtain CBC and BMP in 1 week ?Ensure that he completed his antibiotics, he was given 3 days of Augmentin and Zithromax. ?Follow-up with primary care provider within a week ? ?Discharge Diagnoses: ?Principal Problem: ?  Sepsis (HCC) ?Active Problems: ?  CAP (community acquired pneumonia) ?  COPD (chronic obstructive pulmonary disease) (HCC) ?  Acute renal failure superimposed on stage 3a chronic kidney disease (HCC) ?  Stroke (HCC) ?  Asthma ?  Hyperlipidemia ?  Chronic diastolic CHF (congestive heart failure) (HCC) ?  HTN (hypertension) ?  Hypothyroidism ?  Depression ?  CAD (coronary artery disease) ? ? ?Hospital Course: ?Taken from H&P. ? ?Steve Bryant is a 88 y.o. male with medical history significant of hypertension, hyperlipidemia, COPD, asthma, stroke, GERD, hypothyroidism, dCHF, LAD, carotid artery stenosis, AAA, CKD-3A, SSS, bilateral eye blindness, who presents with cough and weakness. ?  ?Per family members at the bedside, patient developed dry cough and generalized weakness today.  Mostly dry cough.  No significant shortness of breath.  Patient does not have chest pain.  Patient has chills and fever 100.6 at home.  His body temperature is 99.7 in the ED.  Patient does not have nausea, vomiting, diarrhea or abdominal pain.  No symptoms of UTI.  ?  ?Patient had oxygen desaturation to 88% on room air initially, 2 L oxygen started, then improved.  Currently oxygen saturation 100% on room air.  Patient has hypotension with blood pressure 83/53, which improved to 112/68 after giving 3.25 L IV fluid bolus. ?  ?pt was found to have WBC 15.9, lactic acid of 1.6, INR 1.1, negative COVID PCR, worsening renal function, heart rate 86, RR 24, chest x-ray showed  right-sided infiltration, worse on the right upper lobe. ? ?Patient met sepsis criteria with leukocytosis, tachypnea.  Hypotensive which responded well to IV fluid.  Mild AKI.  Secondary to community-acquired pneumonia. ?He was started on ceftriaxone and Zithromax. ? ?5/5: Procalcitonin at 1.44, UA was not consistent with UTI.  BNP mildly elevated at 101, preliminary blood cultures negative.  Strep pneumo antigen negative, Legionella pending. ? ?PT/OT suggested SNF but patient and family would like to go back home, they will arrange private caregivers for further help. ?Seems improving today.   ? ?5/6: Patient remained stable.  Had a good night but was becoming little more hallucinated in the morning.  He was discharged on 3 more days of Zithromax and Augmentin. ?We also discontinue Lyrica as he was on gabapentin and Lyrica both. ?Discussed with son at bedside. ? ?Patient will continue the rest of his home medications and follow-up with his providers. ? ?Assessment and Plan: ?* Sepsis (HCC) ?Patient meets critical for sepsis with WBC 15.9, RR 24.  Initially hypotensive with blood pressure 83/53, which responded to IV fluid resuscitation.  ? ?IVF: total of 3.25 L of bolus in ED, followed by 75 mL per hour of LR  ?Blood pressure trending up, stopping IV fluid now. ?Preliminary blood cultures negative, urine cultures pending but UA was not consistent with UTI. ? ?See A&P under CAP ? ?CAP (community acquired pneumonia) ?Sepsis due to CAP: Patient meets critical for sepsis with WBC 15.9, RR 24.  Initially hypotensive with blood pressure 83/53, which responded to IV fluid resuscitation.    Lactic acid 1.6. ?Clinically seems improving.  Preliminary blood cultures remain negative.  Procalcitonin at 1.44.  UA was not consistent with UTI and urine cultures pending. ?Strep pneumo negative, Legionella pending ?-Continue IV Rocephin and azithromycin ?- Mucinex for cough  ? -Bronchodilators ?-Follow-up urine legionella ? ? ? ?COPD  (chronic obstructive pulmonary disease) ([place]) ?No wheezing or rhonchi on auscultation ?- Bronchodilators ? ?Acute renal failure superimposed on stage 3a chronic kidney disease ([place]) ?Baseline creatinine 1.12 on 07/06/2021.  His creatinine is 1.47, BUN 45 on admission, now improved to 1.01 with IV fluid. ?-Restarting Cozaar ?-Stop IV fluid ?-Encourage p.o. hydration ? ?Stroke [facility]) ?- Continue aspirin, Plavix, Crestor ? ?Asthma ?No wheezing or rhonchi on auscultation ?- Bronchodilators ? ?Hyperlipidemia ?- Crestor ? ?Chronic diastolic CHF (congestive heart failure) ([place]) ? 2D echo on 04/16/2020 showed EF 60 to 65% with grade 1 diastolic dysfunction.  Patient has trace leg edema, no JVD.  CHF seem to be compensated. ?- Check BNP ? ?HTN (hypertension) ?Blood pressure elevated.  Initial home Cozaar was held due to softer blood pressure.  AKI has been resolved. ?-Restart home Cozaar ? ?Hypothyroidism ?- Synthroid ? ?Depression ?- Continue home medications ? ?CAD (coronary artery disease) ?- Aspirin, Plavix, Crestor ? ? ?Consultants: None ?Procedures performed: None ?Disposition: Home ?Diet recommendation:  ?Discharge Diet Orders (From admission, onward)  ? ?  Start     Ordered  ? 07/12/21 0000  Diet - low sodium heart healthy       ? 07/12/21 1205  ? ?  ?  ? ?  ? ?Cardiac diet ?DISCHARGE MEDICATION: ?Allergies as of 07/12/2021   ? ?   Reactions  ? Shellfish-derived Products Nausea Only  ? Scallops  ? ?  ? ?  ?Medication List  ?  ? ?STOP taking these medications   ? ?pregabalin 75 MG capsule ?Commonly known as: LYRICA ?  ?risperiDONE 0.5 MG tablet ?Commonly known as: RisperDAL ?  ? ?  ? ?TAKE these medications   ? ?amoxicillin-clavulanate 875-125 MG tablet ?Commonly known as: AUGMENTIN ?Take 1 tablet by mouth 2 (two) times daily for 3 days. ?  ?aspirin EC 81 MG tablet ?Take 81 mg by mouth daily. ?  ?azithromycin 250 MG tablet ?Commonly known as: Zithromax Z-Pak ?Take 1 tablet daily for the next 3 days ?  ?clopidogrel 75 MG  tablet ?Commonly known as: PLAVIX ?Take 75 mg by mouth daily. ?  ?dextromethorphan-guaiFENesin 30-600 MG 12hr tablet ?Commonly known as: [place] DM ?Take 1 tablet by mouth 2 (two) times daily as needed for cough. ?  ?dorzolamide 2 % ophthalmic solution ?Commonly known as: TRUSOPT ?Place 1 drop into the right eye daily with lunch. ?  ?dorzolamide-timolol 22.3-6.8 MG/ML ophthalmic solution ?Commonly known as: COSOPT ?Place 1 drop into both eyes 2 (two) times daily. ?  ?gabapentin 100 MG capsule ?Commonly known as: NEURONTIN ?Take 100 mg by mouth 2 (two) times daily with a meal. ?  ?latanoprost 0.005 % ophthalmic solution ?Commonly known as: XALATAN ?Place 1 drop into the right eye at bedtime. ?  ?levothyroxine 50 MCG tablet ?Commonly known as: SYNTHROID ?Take 50 mcg by mouth daily. ?  ?losartan 25 MG tablet ?Commonly known as: COZAAR ?Take 1 tablet (25 mg total) by mouth daily. ?  ?multivitamin tablet ?Take 1 tablet by mouth daily. ?  ?Myrbetriq 25 MG Tb24 tablet ?Generic drug: mirabegron ER ?Take 25 mg by mouth daily. ?  ?pantoprazole 20 MG tablet ?Commonly known as: PROTONIX ?Take 20 mg by mouth  daily. ?  ?QUEtiapine 25 MG tablet ?Commonly known as: SEROQUEL ?Take 25 mg by mouth 2 (two) times daily. ?  ?rosuvastatin 5 MG tablet ?Commonly known as: CRESTOR ?Take 5 mg by mouth daily with supper. ?  ? ?  ? ? Follow-up Information   ? ? Bronstein, David, MD. Schedule an appointment as soon as possible for a visit in 1 week(s).   ?Specialty: Family Medicine ?Contact information: ?[address] ?[place] [address] ?[phone] ? ? ?  ?  ? ?  ?  ? ?  ? ?Discharge Exam: ?Filed Weights  ? 07/10/21 1027 07/11/21 0500 07/12/21 0500  ?Weight: 70 kg 71.6 kg 67.7 kg  ? ?General.  Frail, blind elderly man, in no acute distress. ?Pulmonary.  Lungs clear bilaterally, normal respiratory effort. ?CV.  Regular rate and rhythm, no JVD, rub or murmur. ?Abdomen.  Soft, nontender, nondistended, BS positive. ?CNS.  Alert and oriented .   No focal neurologic deficit. ?Extremities.  No edema, no cyanosis, pulses intact and symmetrical. ?Psychiatry.  Judgment and insight appears normal.  ? ?Condition at discharge: stable ? ?The results of significa

## 2021-07-12 NOTE — TOC Transition Note (Signed)
Transition of Care (TOC) - CM/SW Discharge Note ? ? ?Patient Details  ?Name: Steve Bryant ?MRN: [id number] ?Date of Birth: [birth year] ? ?Transition of Care (TOC) CM/SW Contact:  ?Izola Price, RN ?Phone Number: ?07/12/2021, 12:35 PM ? ? ?Clinical Narrative:  5 /6: Patient has discharge orders for today. Prior CM notes indicate son has 24 hour supervision arranged and patient has declines STR/SNF and HH. Son stated they have all DME needs. PCP: Dr. [name] for follow up in one week. Labs to obtain in 1 week in DC summary. Antibiotics changed to oral for discharge. Patient has Medicare A/B. Simmie Davies RN CM ? ? ? ? ?Final next level of care: Home/Self Care ?Barriers to Discharge: Barriers Resolved ? ? ?Patient Goals and CMS Choice ?  ?  ?  ? ?Discharge Placement ?  ?           ?  ?  ?  ?  ? ?Discharge Plan and Services ?  ?  ?Post Acute Care Choice: NA          ?DME Arranged: N/A ?  ?  ?  ?  ?HH Arranged: NA ?  ?  ?  ?  ? ?Social Determinants of Health (SDOH) Interventions ?  ? ? ?Readmission Risk Interventions ?   ? View : No data to display.  ?  ?  ?  ? ? ? ? ? ?

## 2021-07-15 LAB — CULTURE, BLOOD (ROUTINE X 2)
Culture: NO GROWTH
Culture: NO GROWTH
Special Requests: ADEQUATE

## 2021-07-15 LAB — LEGIONELLA PNEUMOPHILA SEROGP 1 UR AG: L. pneumophila Serogp 1 Ur Ag: NEGATIVE

## 2021-08-08 ENCOUNTER — Other Ambulatory Visit (INDEPENDENT_AMBULATORY_CARE_PROVIDER_SITE_OTHER): Payer: Medicare Other

## 2021-08-08 ENCOUNTER — Ambulatory Visit (INDEPENDENT_AMBULATORY_CARE_PROVIDER_SITE_OTHER): Payer: Medicare Other | Admitting: Nurse Practitioner

## 2021-09-11 ENCOUNTER — Other Ambulatory Visit (INDEPENDENT_AMBULATORY_CARE_PROVIDER_SITE_OTHER): Payer: Self-pay | Admitting: Vascular Surgery

## 2021-09-11 ENCOUNTER — Other Ambulatory Visit (INDEPENDENT_AMBULATORY_CARE_PROVIDER_SITE_OTHER): Payer: Self-pay | Admitting: Nurse Practitioner

## 2021-09-11 DIAGNOSIS — I714 Abdominal aortic aneurysm, without rupture, unspecified: Secondary | ICD-10-CM

## 2021-09-12 ENCOUNTER — Ambulatory Visit (INDEPENDENT_AMBULATORY_CARE_PROVIDER_SITE_OTHER): Payer: Medicare Other | Admitting: Nurse Practitioner

## 2021-09-12 ENCOUNTER — Ambulatory Visit (INDEPENDENT_AMBULATORY_CARE_PROVIDER_SITE_OTHER): Payer: Medicare Other

## 2021-09-12 ENCOUNTER — Encounter (INDEPENDENT_AMBULATORY_CARE_PROVIDER_SITE_OTHER): Payer: Self-pay | Admitting: Nurse Practitioner

## 2021-09-12 VITALS — BP 181/74 | HR 60 | Resp 17 | Wt 152.0 lb

## 2021-09-12 DIAGNOSIS — I10 Essential (primary) hypertension: Secondary | ICD-10-CM | POA: Diagnosis not present

## 2021-09-12 DIAGNOSIS — I714 Abdominal aortic aneurysm, without rupture, unspecified: Secondary | ICD-10-CM

## 2021-09-12 DIAGNOSIS — E785 Hyperlipidemia, unspecified: Secondary | ICD-10-CM

## 2021-09-13 ENCOUNTER — Encounter (INDEPENDENT_AMBULATORY_CARE_PROVIDER_SITE_OTHER): Payer: Self-pay | Admitting: Nurse Practitioner

## 2021-09-13 NOTE — Progress Notes (Signed)
Subjective:    Patient ID: Steve Bryant, male    DOB: 06-17-1925, 88 y.o.   MRN: [id number] No chief complaint on file.   The patient returns to the office for surveillance of an abdominal aortic aneurysm status post stent graft placement on 12/25/2020.   Patient denies abdominal pain or back pain, no other abdominal complaints. No groin related complaints. No symptoms consistent with distal embolization No changes in claudication distance or new rest pain symptoms.   There have been no interval changes in his overall healthcare since his last visit.   Patient denies amaurosis fugax or TIA symptoms.  The patient denies recent episodes of angina or shortness of breath.   Duplex US of the aorta and iliac arteries shows a 4.9 AAA sac with no endoleak, minimal change in the sac compared to the previous study.     Review of Systems  All other systems reviewed and are negative.      Objective:   Physical Exam Vitals reviewed.  HENT:     Head: Normocephalic.  Cardiovascular:     Rate and Rhythm: Normal rate and regular rhythm.     Pulses: Normal pulses.     Heart sounds: Murmur heard.  Pulmonary:     Effort: Pulmonary effort is normal.  Skin:    General: Skin is warm and dry.  Neurological:     Mental Status: He is alert and oriented to person, place, and time.  Psychiatric:        Mood and Affect: Mood normal.        Behavior: Behavior normal.        Thought Content: Thought content normal.        Judgment: Judgment normal.     BP (!) 181/74 (BP Location: Left Arm)   Pulse 60   Resp 17   Wt 152 lb (68.9 kg)   BMI 22.45 kg/m   Past Medical History:  Diagnosis Date   AAA (abdominal aortic aneurysm) ([place])    a.) Abd [place] 12/22/16: measured 4.5 cm. b.) CTA 12/27/18: fusiform infrarenal AAA measured 4.7 cm. c.) Abd [place] 07/04/19: measured 5.0 cm. d.) CTA 11/03/19: measured 4.9 cm. e.) CTA 10/21/20: interval increase in size to 5.4 x 5.0 cm extending to the iliac  bifurcation.   Acute ischemic stroke ([place]) 10/16/2016   a.) MRI --> 13 x 14 mm ischemic, non-hemorrhagic, RIGHT paramedian ventral pons territory   Aortic atherosclerosis (H[facility])    Arthritis    Bilateral renal cysts    Bradycardia    Carotid artery disease ([place])    a.) Doppler 07/02/09: RIGHT 50-75%, LEFT < 50%. b.) CTA 07/04/09: RIGHT 90%. LEFT 50-75%. c.) CTA 10/15/2016: RIGHT with no significant stenosis, LEFT 60%.   CHF (congestive heart failure) (HCC)    Chronic anticoagulation    a.) DAPT therapy (ASA + clopidogrel)   Coronary artery disease    DDD (degenerative disc disease), lumbar    Diastolic dysfunction 38/12/1749   a.) TTE 04/16/20: EF 60-65%; G1DD.   DOE (dyspnea on exertion)    Dyspnea    GERD (gastroesophageal reflux disease)    Glaucoma    Hyperlipidemia    Hypertension    Legally blind    Neck mass    a.) CT 07/04/09: 4.9 cm supraclavicular mass just posterior to clavicle; PET CT 07/05/09 showed mass to be FDG avid with max SUV of 6. b.) CT 10/15/16: measured 4 x 5 cm. c.) CT 04/15/20: measured 5.1 x 3.7  cm.   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 WD SCC. EDC   Squamous cell carcinoma of skin 06/15/2019   Right calf. KA-type. EDC   Squamous cell carcinoma of skin 11/13/2020   Right distal hand (in situ) - EDC   Squamous cell carcinoma of skin 11/13/2020   Right prox hand (in situ) - EDC   TIA (transient ischemic attack) 04/15/2020   Valvular regurgitation    a.) TTE 10/17/2016 : EF 55-60%; mild AR/MR. b.) TTE 04/16/2020: EF 60-65%; G1DD, mild AR, triv. MR. c.) TTE 12/09/20: EF >55%; mild AR/TR, triv. PR, mod. MR.    Social History   Socioeconomic History   Marital status: Widowed    Spouse name: Not on file   Number of children: Not on file   Years of education: Not on file   Highest education level: Not on file  Occupational  History   Not on file  Tobacco Use   Smoking status: Former    Packs/day: 1.00    Years: 25.00    Total pack years: 25.00    Types: Cigarettes    Quit date: 21    Years since quitting: 44.5   Smokeless tobacco: Never  Vaping Use   Vaping Use: Never used  Substance and Sexual Activity   Alcohol use: Not Currently    Comment: occasionally   Drug use: No   Sexual activity: Not on file  Other Topics Concern   Not on file  Social History Narrative   Lives alone, has care takers    Social Determinants of Health   Financial Resource Strain: Not on file  Food Insecurity: Not on file  Transportation Needs: Not on file  Physical Activity: Not on file  Stress: Not on file  Social Connections: Not on file  Intimate Partner Violence: Not on file    Past Surgical History:  Procedure Laterality Date   CERVICAL LAMINECTOMY N/A 05/2006   Fusion C3-C7   ENDOVASCULAR REPAIR/STENT GRAFT N/A 12/25/2020   Procedure: ENDOVASCULAR REPAIR/STENT GRAFT;  Surgeon: Algernon Huxley, MD;  Location: [facility];  Service: Cardiovascular;  Laterality: N/A;   GLAUCOMA SURGERY  2003   INGUINAL HERNIA REPAIR Right 2007   TUMOR REMOVAL Right 1977   Thoracotomy with RUL pulmonary mass resection    Family History  Problem Relation Age of Onset   Stroke Mother    Stroke Father    Cancer Brother     Allergies  Allergen Reactions   Shellfish-Derived Products Nausea Only    Scallops       Latest Ref Rng & Units 07/11/2021    6:29 AM 07/10/2021   11:26 AM 07/06/2021   10:54 AM  CBC  WBC 4.0 - 10.5 K/uL 14.3  15.9  9.6   Hemoglobin 13.0 - 17.0 g/dL 13.1  14.4  15.0   Hematocrit 39.0 - 52.0 % 40.2  45.0  47.6   Platelets 150 - 400 K/uL 161  157  183       CMP     Component Value Date/Time   NA 143 07/11/2021 0629   K 3.6 07/11/2021 0629   CL 114 (H) 07/11/2021 0629   CO2 22 07/11/2021 0629   GLUCOSE 97 07/11/2021 0629   BUN 30 (H) 07/11/2021 0629   CREATININE 1.01 07/11/2021  0629   CALCIUM 8.6 (L) 07/11/2021 1308  PROT 6.0 (L) 07/10/2021 1126   ALBUMIN 3.2 (L) 07/10/2021 1126   AST 29 07/10/2021 1126   ALT 14 07/10/2021 1126   ALKPHOS 55 07/10/2021 1126   BILITOT 1.6 (H) 07/10/2021 1126   GFRNONAA >60 07/11/2021 0629   GFRAA 50 (L) 01/28/2019 2127     No results found.     Assessment & Plan:   1. Abdominal aortic aneurysm (AAA) without rupture, unspecified part ([place]) Recommend:  Patient is status post successful endovascular repair of the AAA.   No further intervention is required at this time.   No endoleak is detected and the aneurysm sac is stable.  The patient will continue antiplatelet therapy as prescribed as well as aggressive management of hyperlipidemia. Exercise is again strongly encouraged.   However, endografts require continued surveillance with ultrasound or CT scan. This is mandatory to detect any changes that allow repressurization of the aneurysm sac.  The patient is informed that this would be asymptomatic.  The patient is reminded that lifelong routine surveillance is a necessity with an endograft. Patient will continue to follow-up at the specified interval with ultrasound of the aorta.  Patient will follow-up in 1 year - VAS [place] EVAR DUPLEX  2. Essential hypertension, benign Continue antihypertensive medications as already ordered, these medications have been reviewed and there are no changes at this time.   3. Hyperlipidemia, unspecified hyperlipidemia type Continue statin as ordered and reviewed, no changes at this time    Current Outpatient Medications on File Prior to Visit  Medication Sig Dispense Refill   aspirin EC 81 MG tablet Take 81 mg by mouth daily.     clopidogrel (PLAVIX) 75 MG tablet Take 75 mg by mouth daily.     dorzolamide (TRUSOPT) 2 % ophthalmic solution Place 1 drop into the right eye daily with lunch.     dorzolamide-timolol (COSOPT) 22.3-6.8 MG/ML ophthalmic solution Place 1 drop into both eyes 2  (two) times daily.  2   gabapentin (NEURONTIN) 100 MG capsule Take 100 mg by mouth 2 (two) times daily with a meal.     latanoprost (XALATAN) 0.005 % ophthalmic solution Place 1 drop into the right eye at bedtime.     levothyroxine (SYNTHROID) 50 MCG tablet Take 50 mcg by mouth daily.     losartan (COZAAR) 25 MG tablet Take 1 tablet (25 mg total) by mouth daily. 30 tablet 2   Multiple Vitamin (MULTIVITAMIN) tablet Take 1 tablet by mouth daily.     MYRBETRIQ 25 MG TB24 tablet Take 25 mg by mouth daily.     pantoprazole (PROTONIX) 20 MG tablet Take 20 mg by mouth daily.     QUEtiapine (SEROQUEL) 25 MG tablet Take 25 mg by mouth 2 (two) times daily.     rosuvastatin (CRESTOR) 5 MG tablet Take 5 mg by mouth daily with supper.     No current facility-administered medications on file prior to visit.    There are no Patient Instructions on file for this visit. No follow-ups on file.   Kris Hartmann, NP

## 2021-11-18 ENCOUNTER — Ambulatory Visit (INDEPENDENT_AMBULATORY_CARE_PROVIDER_SITE_OTHER): Payer: Medicare Other | Admitting: Dermatology

## 2021-11-18 DIAGNOSIS — L570 Actinic keratosis: Secondary | ICD-10-CM

## 2021-11-18 DIAGNOSIS — L578 Other skin changes due to chronic exposure to nonionizing radiation: Secondary | ICD-10-CM

## 2021-11-18 DIAGNOSIS — C44729 Squamous cell carcinoma of skin of left lower limb, including hip: Secondary | ICD-10-CM | POA: Diagnosis not present

## 2021-11-18 DIAGNOSIS — C4492 Squamous cell carcinoma of skin, unspecified: Secondary | ICD-10-CM

## 2021-11-18 DIAGNOSIS — D492 Neoplasm of unspecified behavior of bone, soft tissue, and skin: Secondary | ICD-10-CM

## 2021-11-18 HISTORY — DX: Squamous cell carcinoma of skin, unspecified: C44.92

## 2021-11-18 NOTE — Patient Instructions (Addendum)
Cryotherapy Aftercare  Wash gently with soap and water everyday.   Apply Vaseline and Band-Aid daily until healed.  Wound Care Instructions  Cleanse wound gently with soap and water once a day then pat dry with clean gauze. Apply a thin coat of Petrolatum (petroleum jelly, "Vaseline") over the wound (unless you have an allergy to this). We recommend that you use a new, sterile tube of Vaseline. Do not pick or remove scabs. Do not remove the yellow or white "healing tissue" from the base of the wound.  Cover the wound with fresh, clean, nonstick gauze and secure with paper tape. You may use Band-Aids in place of gauze and tape if the wound is small enough, but would recommend trimming much of the tape off as there is often too much. Sometimes Band-Aids can irritate the skin.  You should call the office for your biopsy report after 1 week if you have not already been contacted.  If you experience any problems, such as abnormal amounts of bleeding, swelling, significant bruising, significant pain, or evidence of infection, please call the office immediately.  FOR ADULT SURGERY PATIENTS: If you need something for pain relief you may take 1 extra strength Tylenol (acetaminophen) AND 2 Ibuprofen (200mg each) together every 4 hours as needed for pain. (do not take these if you are allergic to them or if you have a reason you should not take them.) Typically, you may only need pain medication for 1 to 3 days.      Due to recent changes in healthcare laws, you may see results of your pathology and/or laboratory studies on MyChart before the doctors have had a chance to review them. We understand that in some cases there may be results that are confusing or concerning to you. Please understand that not all results are received at the same time and often the doctors may need to interpret multiple results in order to provide you with the best plan of care or course of treatment. Therefore, we ask that you  please give us 2 business days to thoroughly review all your results before contacting the office for clarification. Should we see a critical lab result, you will be contacted sooner.   If You Need Anything After Your Visit  If you have any questions or concerns for your doctor, please call our main line at [phone] and press option 4 to reach your doctor's medical assistant. If no one answers, please leave a voicemail as directed and we will return your call as soon as possible. Messages left after 4 pm will be answered the following business day.   You may also send us a message via MyChart. We typically respond to MyChart messages within 1-2 business days.  For prescription refills, please ask your pharmacy to contact our office. Our fax number is [phone].  If you have an urgent issue when the clinic is closed that cannot wait until the next business day, you can page your doctor at the number below.    Please note that while we do our best to be available for urgent issues outside of office hours, we are not available 24/7.   If you have an urgent issue and are unable to reach us, you may choose to seek medical care at your doctor's office, retail clinic, urgent care center, or emergency room.  If you have a medical emergency, please immediately call 911 or go to the emergency department.  Pager Numbers  - Dr. Kowalski: [phone]  -   Dr. Moye: [phone]  - Dr. Stewart: [phone]  In the event of inclement weather, please call our main line at [phone] for an update on the status of any delays or closures.  Dermatology Medication Tips: Please keep the boxes that topical medications come in in order to help keep track of the instructions about where and how to use these. Pharmacies typically print the medication instructions only on the boxes and not directly on the medication tubes.   If your medication is too expensive, please contact our office at  [phone] option 4 or send us a message through MyChart.   We are unable to tell what your co-pay for medications will be in advance as this is different depending on your insurance coverage. However, we may be able to find a substitute medication at lower cost or fill out paperwork to get insurance to cover a needed medication.   If a prior authorization is required to get your medication covered by your insurance company, please allow us 1-2 business days to complete this process.  Drug prices often vary depending on where the prescription is filled and some pharmacies may offer cheaper prices.  The website www.goodrx.com contains coupons for medications through different pharmacies. The prices here do not account for what the cost may be with help from insurance (it may be cheaper with your insurance), but the website can give you the price if you did not use any insurance.  - You can print the associated coupon and take it with your prescription to the pharmacy.  - You may also stop by our office during regular business hours and pick up a GoodRx coupon card.  - If you need your prescription sent electronically to a different pharmacy, notify our office through [facility] MyChart or by phone at [phone] option 4.     Si Usted Necesita Algo Despus de Su Visita  Tambin puede enviarnos un mensaje a travs de MyChart. Por lo general respondemos a los mensajes de MyChart en el transcurso de 1 a 2 das hbiles.  Para renovar recetas, por favor pida a su farmacia que se ponga en contacto con nuestra oficina. Nuestro nmero de fax es el [phone].  Si tiene un asunto urgente cuando la clnica est cerrada y que no puede esperar hasta el siguiente da hbil, puede llamar/localizar a su doctor(a) al nmero que aparece a continuacin.   Por favor, tenga en cuenta que aunque hacemos todo lo posible para estar disponibles para asuntos urgentes fuera del horario de oficina, no estamos  disponibles las 24 horas del da, los 7 das de la semana.   Si tiene un problema urgente y no puede comunicarse con nosotros, puede optar por buscar atencin mdica  en el consultorio de su doctor(a), en una clnica privada, en un centro de atencin urgente o en una sala de emergencias.  Si tiene una emergencia mdica, por favor llame inmediatamente al 911 o vaya a la sala de emergencias.  Nmeros de bper  - Dr. Kowalski: [phone]  - Dra. Moye: [phone]  - Dra. Stewart: [phone]  En caso de inclemencias del tiempo, por favor llame a nuestra lnea principal al [phone] para una actualizacin sobre el estado de cualquier retraso o cierre.  Consejos para la medicacin en dermatologa: Por favor, guarde las cajas en las que vienen los medicamentos de uso tpico para ayudarle a seguir las instrucciones sobre dnde y cmo usarlos. Las farmacias generalmente imprimen las instrucciones del medicamento slo en las cajas y   no directamente en los tubos del medicamento.   Si su medicamento es muy caro, por favor, pngase en contacto con nuestra oficina llamando al [phone] y presione la opcin 4 o envenos un mensaje a travs de MyChart.   No podemos decirle cul ser su copago por los medicamentos por adelantado ya que esto es diferente dependiendo de la cobertura de su seguro. Sin embargo, es posible que podamos encontrar un medicamento sustituto a menor costo o llenar un formulario para que el seguro cubra el medicamento que se considera necesario.   Si se requiere una autorizacin previa para que su compaa de seguros cubra su medicamento, por favor permtanos de 1 a 2 das hbiles para completar este proceso.  Los precios de los medicamentos varan con frecuencia dependiendo del lugar de dnde se surte la receta y alguna farmacias pueden ofrecer precios ms baratos.  El sitio web www.goodrx.com tiene cupones para medicamentos de diferentes farmacias. Los precios aqu no  tienen en cuenta lo que podra costar con la ayuda del seguro (puede ser ms barato con su seguro), pero el sitio web puede darle el precio si no utiliz ningn seguro.  - Puede imprimir el cupn correspondiente y llevarlo con su receta a la farmacia.  - Tambin puede pasar por nuestra oficina durante el horario de atencin regular y recoger una tarjeta de cupones de GoodRx.  - Si necesita que su receta se enve electrnicamente a una farmacia diferente, informe a nuestra oficina a travs de MyChart de [facility] o por telfono llamando al [phone] y presione la opcin 4.  

## 2021-11-18 NOTE — Progress Notes (Signed)
Follow-Up Visit   Subjective  Steve Bryant is a 88 y.o. male who presents for the following: Skin Problem (Patient c/o growth on the left lower leg his PCP recently noticed ).  The patient has spots, moles and lesions to be evaluated, some may be new or changing and the patient has concerns that these could be cancer.   Care taker with patient   The following portions of the chart were reviewed this encounter and updated as appropriate:   Tobacco  Allergies  Meds  Problems  Med Hx  Surg Hx  Fam Hx      Review of Systems:  No other skin or systemic complaints except as noted in HPI or Assessment and Plan.  Objective  Well appearing patient in no apparent distress; mood and affect are within normal limits.  A focused examination was performed including legs, face, arms. Relevant physical exam findings are noted in the Assessment and Plan.  Left Lower Leg - Anterior 0.9 cm scaly pink nodule        left pretibial x 6, right pretibial x 3, right calf x 1, right 4th finger x 1, right 2nd finger x 1, right dorsal hand x 5, right dorsal wrist x 3, right dorsal forearm x 5, left dorsal foream x 1  (26) (26) Erythematous thin papules/macules with gritty scale.     Assessment & Plan  Neoplasm of skin Left Lower Leg - Anterior  Skin / nail biopsy Type of biopsy: tangential   Informed consent: discussed and consent obtained   Patient was prepped and draped in usual sterile fashion: area prepped with alochol. Anesthesia: the lesion was anesthetized in a standard fashion   Anesthetic:  1% lidocaine w/ epinephrine 1-100,000 buffered w/ 8.4% NaHCO3 Instrument used: flexible razor blade   Hemostasis achieved with: pressure, aluminum chloride and electrodesiccation   Outcome: patient tolerated procedure well   Post-procedure details: wound care instructions given   Post-procedure details comment:  Ointment and small bandage  Specimen 1 - Surgical pathology Differential  Diagnosis: R/O SCC  Check Margins: No  AK (actinic keratosis) (26) left pretibial x 6, right pretibial x 3, right calf x 1, right 4th finger x 1, right 2nd finger x 1, right dorsal hand x 5, right dorsal wrist x 3, right dorsal forearm x 5, left dorsal foream x 1  (26)  Hypertrophic Aks- if not gone at next visit may consider biopsies   Actinic keratoses are precancerous spots that appear secondary to cumulative UV radiation exposure/sun exposure over time. They are chronic with expected duration over 1 year. A portion of actinic keratoses will progress to squamous cell carcinoma of the skin. It is not possible to reliably predict which spots will progress to skin cancer and so treatment is recommended to prevent development of skin cancer.  Recommend daily broad spectrum sunscreen SPF 30+ to sun-exposed areas, reapply every 2 hours as needed.  Recommend staying in the shade or wearing long sleeves, sun glasses (UVA+UVB protection) and wide brim hats (4-inch brim around the entire circumference of the hat). Call for new or changing lesions.   Destruction of lesion - left pretibial x 6, right pretibial x 3, right calf x 1, right 4th finger x 1, right 2nd finger x 1, right dorsal hand x 5, right dorsal wrist x 3, right dorsal forearm x 5, left dorsal foream x 1  (26) Complexity: simple   Destruction method: cryotherapy   Informed consent: discussed and consent obtained  Timeout:  patient name, date of birth, surgical site, and procedure verified Lesion destroyed using liquid nitrogen: Yes   Region frozen until ice ball extended beyond lesion: Yes   Outcome: patient tolerated procedure well with no complications   Post-procedure details: wound care instructions given   Additional details:  Prior to procedure, discussed risks of blister formation, small wound, skin dyspigmentation, or rare scar following cryotherapy. Recommend Vaseline ointment to treated areas while healing.   Actinic  Damage - Severe, confluent actinic changes with pre-cancerous actinic keratoses  - Severe, chronic, not at goal, secondary to cumulative UV radiation exposure over time - diffuse scaly erythematous macules and papules with underlying dyspigmentation - Discussed Prescription "Field Treatment" for Severe, Chronic Confluent Actinic Changes with Pre-Cancerous Actinic Keratoses May consider PDT in the future to reduce risk of multiple SCCs - Recommend daily broad spectrum sunscreen SPF 30+ to sun-exposed areas, reapply every 2 hours as needed.  - Staying in the shade or wearing long sleeves, sun glasses (UVA+UVB protection) and wide brim hats (4-inch brim around the entire circumference of the hat) are also recommended. - Call for new or changing lesions.   Return in about 4 weeks (around 12/16/2021) for Aks, possible EDC-20 min appt .  I, Marye Round, CMA, am acting as scribe for Forest Gleason, MD .   Documentation: I have reviewed the above documentation for accuracy and completeness, and I agree with the above.  Forest Gleason, MD

## 2021-11-20 ENCOUNTER — Telehealth: Payer: Self-pay

## 2021-11-20 ENCOUNTER — Encounter: Payer: Self-pay | Admitting: Dermatology

## 2021-11-20 NOTE — Telephone Encounter (Addendum)
  Called and discussed patient's with results with daughter. She verbalized understanding and denied further questions. She is ok with scheduling treatment for patient on his next follow up that is already scheduled 12/16/21.     ----- Message from Alfonso Patten, MD sent at 11/20/2021 10:21 AM EDT ----- Skin , left lower leg-anterior SQUAMOUS CELL CARCINOMA, KERATOACANTHOMA TYPE --> ED&C  MAs please call with results and schedule in next 2-3 weeks. Thank you!

## 2021-12-04 ENCOUNTER — Other Ambulatory Visit (HOSPITAL_BASED_OUTPATIENT_CLINIC_OR_DEPARTMENT_OTHER): Payer: Self-pay | Admitting: Physician Assistant

## 2021-12-04 ENCOUNTER — Other Ambulatory Visit: Payer: Self-pay | Admitting: Physician Assistant

## 2021-12-04 DIAGNOSIS — R441 Visual hallucinations: Secondary | ICD-10-CM

## 2021-12-04 DIAGNOSIS — R413 Other amnesia: Secondary | ICD-10-CM

## 2021-12-04 DIAGNOSIS — R200 Anesthesia of skin: Secondary | ICD-10-CM

## 2021-12-04 DIAGNOSIS — R296 Repeated falls: Secondary | ICD-10-CM

## 2021-12-16 ENCOUNTER — Encounter: Payer: Self-pay | Admitting: Dermatology

## 2021-12-16 ENCOUNTER — Ambulatory Visit (INDEPENDENT_AMBULATORY_CARE_PROVIDER_SITE_OTHER): Payer: Medicare Other | Admitting: Dermatology

## 2021-12-16 DIAGNOSIS — C4492 Squamous cell carcinoma of skin, unspecified: Secondary | ICD-10-CM

## 2021-12-16 DIAGNOSIS — C44729 Squamous cell carcinoma of skin of left lower limb, including hip: Secondary | ICD-10-CM | POA: Diagnosis not present

## 2021-12-16 NOTE — Patient Instructions (Addendum)
Wound Care Instructions  Cleanse wound gently with soap and water once a day then pat dry with clean gauze. Apply a thin coat of Petrolatum (petroleum jelly, "Vaseline") over the wound (unless you have an allergy to this). We recommend that you use a new, sterile tube of Vaseline. Do not pick or remove scabs. Do not remove the yellow or white "healing tissue" from the base of the wound.  Cover the wound with fresh, clean, nonstick gauze and secure with paper tape. You may use Band-Aids in place of gauze and tape if the wound is small enough, but would recommend trimming much of the tape off as there is often too much. Sometimes Band-Aids can irritate the skin.  You should call the office for your biopsy report after 1 week if you have not already been contacted.  If you experience any problems, such as abnormal amounts of bleeding, swelling, significant bruising, significant pain, or evidence of infection, please call the office immediately.  FOR ADULT SURGERY PATIENTS: If you need something for pain relief you may take 1 extra strength Tylenol (acetaminophen) AND 2 Ibuprofen (200mg each) together every 4 hours as needed for pain. (do not take these if you are allergic to them or if you have a reason you should not take them.) Typically, you may only need pain medication for 1 to 3 days.     Due to recent changes in healthcare laws, you may see results of your pathology and/or laboratory studies on MyChart before the doctors have had a chance to review them. We understand that in some cases there may be results that are confusing or concerning to you. Please understand that not all results are received at the same time and often the doctors may need to interpret multiple results in order to provide you with the best plan of care or course of treatment. Therefore, we ask that you please give us 2 business days to thoroughly review all your results before contacting the office for clarification. Should  we see a critical lab result, you will be contacted sooner.   If You Need Anything After Your Visit  If you have any questions or concerns for your doctor, please call our main line at [phone] and press option 4 to reach your doctor's medical assistant. If no one answers, please leave a voicemail as directed and we will return your call as soon as possible. Messages left after 4 pm will be answered the following business day.   You may also send us a message via MyChart. We typically respond to MyChart messages within 1-2 business days.  For prescription refills, please ask your pharmacy to contact our office. Our fax number is [phone].  If you have an urgent issue when the clinic is closed that cannot wait until the next business day, you can page your doctor at the number below.    Please note that while we do our best to be available for urgent issues outside of office hours, we are not available 24/7.   If you have an urgent issue and are unable to reach us, you may choose to seek medical care at your doctor's office, retail clinic, urgent care center, or emergency room.  If you have a medical emergency, please immediately call 911 or go to the emergency department.  Pager Numbers  - Dr. Kowalski: [phone]  - Dr. Moye: [phone]  - Dr. Stewart: [phone]  In the event of inclement weather, please call our main line at   [phone] for an update on the status of any delays or closures.  Dermatology Medication Tips: Please keep the boxes that topical medications come in in order to help keep track of the instructions about where and how to use these. Pharmacies typically print the medication instructions only on the boxes and not directly on the medication tubes.   If your medication is too expensive, please contact our office at [phone] option 4 or send us a message through MyChart.   We are unable to tell what your co-pay for medications will be in  advance as this is different depending on your insurance coverage. However, we may be able to find a substitute medication at lower cost or fill out paperwork to get insurance to cover a needed medication.   If a prior authorization is required to get your medication covered by your insurance company, please allow us 1-2 business days to complete this process.  Drug prices often vary depending on where the prescription is filled and some pharmacies may offer cheaper prices.  The website www.goodrx.com contains coupons for medications through different pharmacies. The prices here do not account for what the cost may be with help from insurance (it may be cheaper with your insurance), but the website can give you the price if you did not use any insurance.  - You can print the associated coupon and take it with your prescription to the pharmacy.  - You may also stop by our office during regular business hours and pick up a GoodRx coupon card.  - If you need your prescription sent electronically to a different pharmacy, notify our office through [facility] MyChart or by phone at [phone] option 4.     Si Usted Necesita Algo Despus de Su Visita  Tambin puede enviarnos un mensaje a travs de MyChart. Por lo general respondemos a los mensajes de MyChart en el transcurso de 1 a 2 das hbiles.  Para renovar recetas, por favor pida a su farmacia que se ponga en contacto con nuestra oficina. Nuestro nmero de fax es el [phone].  Si tiene un asunto urgente cuando la clnica est cerrada y que no puede esperar hasta el siguiente da hbil, puede llamar/localizar a su doctor(a) al nmero que aparece a continuacin.   Por favor, tenga en cuenta que aunque hacemos todo lo posible para estar disponibles para asuntos urgentes fuera del horario de oficina, no estamos disponibles las 24 horas del da, los 7 das de la semana.   Si tiene un problema urgente y no puede comunicarse con nosotros, puede  optar por buscar atencin mdica  en el consultorio de su doctor(a), en una clnica privada, en un centro de atencin urgente o en una sala de emergencias.  Si tiene una emergencia mdica, por favor llame inmediatamente al 911 o vaya a la sala de emergencias.  Nmeros de bper  - Dr. Kowalski: [phone]  - Dra. Moye: [phone]  - Dra. Stewart: [phone]  En caso de inclemencias del tiempo, por favor llame a nuestra lnea principal al [phone] para una actualizacin sobre el estado de cualquier retraso o cierre.  Consejos para la medicacin en dermatologa: Por favor, guarde las cajas en las que vienen los medicamentos de uso tpico para ayudarle a seguir las instrucciones sobre dnde y cmo usarlos. Las farmacias generalmente imprimen las instrucciones del medicamento slo en las cajas y no directamente en los tubos del medicamento.   Si su medicamento es muy caro, por favor, pngase en contacto con   nuestra oficina llamando al [phone] y presione la opcin 4 o envenos un mensaje a travs de MyChart.   No podemos decirle cul ser su copago por los medicamentos por adelantado ya que esto es diferente dependiendo de la cobertura de su seguro. Sin embargo, es posible que podamos encontrar un medicamento sustituto a menor costo o llenar un formulario para que el seguro cubra el medicamento que se considera necesario.   Si se requiere una autorizacin previa para que su compaa de seguros cubra su medicamento, por favor permtanos de 1 a 2 das hbiles para completar este proceso.  Los precios de los medicamentos varan con frecuencia dependiendo del lugar de dnde se surte la receta y alguna farmacias pueden ofrecer precios ms baratos.  El sitio web www.goodrx.com tiene cupones para medicamentos de diferentes farmacias. Los precios aqu no tienen en cuenta lo que podra costar con la ayuda del seguro (puede ser ms barato con su seguro), pero el sitio web puede darle el  precio si no utiliz ningn seguro.  - Puede imprimir el cupn correspondiente y llevarlo con su receta a la farmacia.  - Tambin puede pasar por nuestra oficina durante el horario de atencin regular y recoger una tarjeta de cupones de GoodRx.  - Si necesita que su receta se enve electrnicamente a una farmacia diferente, informe a nuestra oficina a travs de MyChart de [facility] o por telfono llamando al [phone] y presione la opcin 4.  

## 2021-12-16 NOTE — Progress Notes (Signed)
    Follow-Up Visit   Subjective  Steve Bryant is a 88 y.o. male who presents for the following: Follow-up (Treatment of biopsy proven SCC on the left lower leg). Per care take patient daughter do not want any LN2 treatment for precancers for her dad today.  Caretaker with patient   The following portions of the chart were reviewed this encounter and updated as appropriate:   Tobacco  Allergies  Meds  Problems  Med Hx  Surg Hx  Fam Hx      Review of Systems:  No other skin or systemic complaints except as noted in HPI or Assessment and Plan.  Objective  Well appearing patient in no apparent distress; mood and affect are within normal limits.  A focused examination was performed including left lower leg. Relevant physical exam findings are noted in the Assessment and Plan.  Left Lower Leg - Anterior Keratotic pink papule/nodule or plaque.     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  Destruction of lesion  Destruction method: electrodesiccation and curettage   Informed consent: discussed and consent obtained   Timeout:  patient name, date of birth, surgical site, and procedure verified Anesthesia: the lesion was anesthetized in a standard fashion   Anesthetic:  1% lidocaine w/ epinephrine 1-100,000 buffered w/ 8.4% NaHCO3 Curettage performed in three different directions: Yes   Electrodesiccation performed over the curetted area: Yes   Curettage cycles:  3 Final wound size (cm):  1.6 Hemostasis achieved with:  electrodesiccation Outcome: patient tolerated procedure well with no complications   Post-procedure details: sterile dressing applied and wound care instructions given   Dressing type: petrolatum     Return in about 3 months (around 03/18/2022) for 1-3 months AKs .  I, Marye Round, CMA, am acting as scribe for Forest Gleason, MD .   Documentation: I have reviewed the above documentation for accuracy and  completeness, and I agree with the above.  Forest Gleason, MD

## 2021-12-19 ENCOUNTER — Encounter: Payer: Self-pay | Admitting: Dermatology

## 2021-12-24 ENCOUNTER — Ambulatory Visit
Admission: RE | Admit: 2021-12-24 | Discharge: 2021-12-24 | Disposition: A | Discharge status: Home / Self Care | Payer: Medicare Other | Source: Ambulatory Visit | Attending: Physician Assistant | Admitting: Physician Assistant

## 2021-12-24 DIAGNOSIS — R441 Visual hallucinations: Secondary | ICD-10-CM | POA: Diagnosis present

## 2021-12-24 DIAGNOSIS — R200 Anesthesia of skin: Secondary | ICD-10-CM | POA: Insufficient documentation

## 2021-12-24 DIAGNOSIS — R202 Paresthesia of skin: Secondary | ICD-10-CM | POA: Diagnosis present

## 2021-12-24 DIAGNOSIS — R413 Other amnesia: Secondary | ICD-10-CM | POA: Insufficient documentation

## 2021-12-24 DIAGNOSIS — R296 Repeated falls: Secondary | ICD-10-CM | POA: Diagnosis present

## 2022-01-05 ENCOUNTER — Encounter (INDEPENDENT_AMBULATORY_CARE_PROVIDER_SITE_OTHER): Payer: Self-pay

## 2022-01-19 ENCOUNTER — Ambulatory Visit: Payer: Medicare Other | Admitting: Dermatology

## 2022-02-18 ENCOUNTER — Ambulatory Visit: Payer: Medicare Other | Admitting: Dermatology

## 2022-04-16 ENCOUNTER — Ambulatory Visit (INDEPENDENT_AMBULATORY_CARE_PROVIDER_SITE_OTHER): Payer: Medicare Other | Admitting: Dermatology

## 2022-04-16 VITALS — BP 178/95 | HR 74

## 2022-04-16 DIAGNOSIS — C4491 Basal cell carcinoma of skin, unspecified: Secondary | ICD-10-CM

## 2022-04-16 DIAGNOSIS — L578 Other skin changes due to chronic exposure to nonionizing radiation: Secondary | ICD-10-CM | POA: Diagnosis not present

## 2022-04-16 DIAGNOSIS — D692 Other nonthrombocytopenic purpura: Secondary | ICD-10-CM

## 2022-04-16 DIAGNOSIS — C44319 Basal cell carcinoma of skin of other parts of face: Secondary | ICD-10-CM | POA: Diagnosis not present

## 2022-04-16 DIAGNOSIS — D485 Neoplasm of uncertain behavior of skin: Secondary | ICD-10-CM

## 2022-04-16 DIAGNOSIS — L821 Other seborrheic keratosis: Secondary | ICD-10-CM

## 2022-04-16 DIAGNOSIS — Z85828 Personal history of other malignant neoplasm of skin: Secondary | ICD-10-CM

## 2022-04-16 DIAGNOSIS — C44719 Basal cell carcinoma of skin of left lower limb, including hip: Secondary | ICD-10-CM

## 2022-04-16 DIAGNOSIS — L570 Actinic keratosis: Secondary | ICD-10-CM | POA: Diagnosis not present

## 2022-04-16 HISTORY — DX: Basal cell carcinoma of skin, unspecified: C44.91

## 2022-04-16 NOTE — Patient Instructions (Addendum)
Electrodesiccation and Curettage ("Scrape and Burn") Wound Care Instructions  Leave the original bandage on for 24 hours if possible.  If the bandage becomes soaked or soiled before that time, it is OK to remove it and examine the wound.  A small amount of post-operative bleeding is normal.  If excessive bleeding occurs, remove the bandage, place gauze over the site and apply continuous pressure (no peeking) over the area for 30 minutes. If this does not work, please call our clinic as soon as possible or page your doctor if it is after hours.   Once a day, cleanse the wound with soap and water. It is fine to shower. If a thick crust develops you may use a Q-tip dipped into dilute hydrogen peroxide (mix 1:1 with water) to dissolve it.  Hydrogen peroxide can slow the healing process, so use it only as needed.    After washing, apply petroleum jelly (Vaseline) or an antibiotic ointment if your doctor prescribed one for you, followed by a bandage.    For best healing, the wound should be covered with a layer of ointment at all times. If you are not able to keep the area covered with a bandage to hold the ointment in place, this may mean re-applying the ointment several times a day.  Continue this wound care until the wound has healed and is no longer open. It may take several weeks for the wound to heal and close.  Itching and mild discomfort is normal during the healing process.  If you have any discomfort, you can take Tylenol (acetaminophen) or ibuprofen as directed on the bottle. (Please do not take these if you have an allergy to them or cannot take them for another reason).  Some redness, tenderness and white or yellow material in the wound is normal healing.  If the area becomes very sore and red, or develops a thick yellow-green material (pus), it may be infected; please notify us.    Wound healing continues for up to one year following surgery. It is not unusual to experience pain in the scar  from time to time during the interval.  If the pain becomes severe or the scar thickens, you should notify the office.    A slight amount of redness in a scar is expected for the first six months.  After six months, the redness will fade and the scar will soften and fade.  The color difference becomes less noticeable with time.  If there are any problems, return for a post-op surgery check at your earliest convenience.  To improve the appearance of the scar, you can use silicone scar gel, cream, or sheets (such as Mederma or Serica) every night for up to one year. These are available over the counter (without a prescription).  Please call our office at [phone] for any questions or concerns.     Due to recent changes in healthcare laws, you may see results of your pathology and/or laboratory studies on MyChart before the doctors have had a chance to review them. We understand that in some cases there may be results that are confusing or concerning to you. Please understand that not all results are received at the same time and often the doctors may need to interpret multiple results in order to provide you with the best plan of care or course of treatment. Therefore, we ask that you please give us 2 business days to thoroughly review all your results before contacting the office for clarification. Should   we see a critical lab result, you will be contacted sooner.   If You Need Anything After Your Visit  If you have any questions or concerns for your doctor, please call our main line at [phone] and press option 4 to reach your doctor's medical assistant. If no one answers, please leave a voicemail as directed and we will return your call as soon as possible. Messages left after 4 pm will be answered the following business day.   You may also send us a message via MyChart. We typically respond to MyChart messages within 1-2 business days.  For prescription refills, please ask your  pharmacy to contact our office. Our fax number is [phone].  If you have an urgent issue when the clinic is closed that cannot wait until the next business day, you can page your doctor at the number below.    Please note that while we do our best to be available for urgent issues outside of office hours, we are not available 24/7.   If you have an urgent issue and are unable to reach us, you may choose to seek medical care at your doctor's office, retail clinic, urgent care center, or emergency room.  If you have a medical emergency, please immediately call 911 or go to the emergency department.  Pager Numbers  - Dr. Kowalski: [phone]  - Dr. Moye: [phone]  - Dr. Stewart: [phone]  In the event of inclement weather, please call our main line at [phone] for an update on the status of any delays or closures.  Dermatology Medication Tips: Please keep the boxes that topical medications come in in order to help keep track of the instructions about where and how to use these. Pharmacies typically print the medication instructions only on the boxes and not directly on the medication tubes.   If your medication is too expensive, please contact our office at [phone] option 4 or send us a message through MyChart.   We are unable to tell what your co-pay for medications will be in advance as this is different depending on your insurance coverage. However, we may be able to find a substitute medication at lower cost or fill out paperwork to get insurance to cover a needed medication.   If a prior authorization is required to get your medication covered by your insurance company, please allow us 1-2 business days to complete this process.  Drug prices often vary depending on where the prescription is filled and some pharmacies may offer cheaper prices.  The website www.goodrx.com contains coupons for medications through different pharmacies. The prices here do not  account for what the cost may be with help from insurance (it may be cheaper with your insurance), but the website can give you the price if you did not use any insurance.  - You can print the associated coupon and take it with your prescription to the pharmacy.  - You may also stop by our office during regular business hours and pick up a GoodRx coupon card.  - If you need your prescription sent electronically to a different pharmacy, notify our office through [facility] MyChart or by phone at [phone] option 4.     Si Usted Necesita Algo Despus de Su Visita  Tambin puede enviarnos un mensaje a travs de MyChart. Por lo general respondemos a los mensajes de MyChart en el transcurso de 1 a 2 das hbiles.  Para renovar recetas, por favor pida a su farmacia que se ponga en   contacto con nuestra oficina. Nuestro nmero de fax es el [phone].  Si tiene un asunto urgente cuando la clnica est cerrada y que no puede esperar hasta el siguiente da hbil, puede llamar/localizar a su doctor(a) al nmero que aparece a continuacin.   Por favor, tenga en cuenta que aunque hacemos todo lo posible para estar disponibles para asuntos urgentes fuera del horario de oficina, no estamos disponibles las 24 horas del da, los 7 das de la semana.   Si tiene un problema urgente y no puede comunicarse con nosotros, puede optar por buscar atencin mdica  en el consultorio de su doctor(a), en una clnica privada, en un centro de atencin urgente o en una sala de emergencias.  Si tiene una emergencia mdica, por favor llame inmediatamente al 911 o vaya a la sala de emergencias.  Nmeros de bper  - Dr. Kowalski: [phone]  - Dra. Moye: [phone]  - Dra. Stewart: [phone]  En caso de inclemencias del tiempo, por favor llame a nuestra lnea principal al [phone] para una actualizacin sobre el estado de cualquier retraso o cierre.  Consejos para la medicacin en dermatologa: Por  favor, guarde las cajas en las que vienen los medicamentos de uso tpico para ayudarle a seguir las instrucciones sobre dnde y cmo usarlos. Las farmacias generalmente imprimen las instrucciones del medicamento slo en las cajas y no directamente en los tubos del medicamento.   Si su medicamento es muy caro, por favor, pngase en contacto con nuestra oficina llamando al [phone] y presione la opcin 4 o envenos un mensaje a travs de MyChart.   No podemos decirle cul ser su copago por los medicamentos por adelantado ya que esto es diferente dependiendo de la cobertura de su seguro. Sin embargo, es posible que podamos encontrar un medicamento sustituto a menor costo o llenar un formulario para que el seguro cubra el medicamento que se considera necesario.   Si se requiere una autorizacin previa para que su compaa de seguros cubra su medicamento, por favor permtanos de 1 a 2 das hbiles para completar este proceso.  Los precios de los medicamentos varan con frecuencia dependiendo del lugar de dnde se surte la receta y alguna farmacias pueden ofrecer precios ms baratos.  El sitio web www.goodrx.com tiene cupones para medicamentos de diferentes farmacias. Los precios aqu no tienen en cuenta lo que podra costar con la ayuda del seguro (puede ser ms barato con su seguro), pero el sitio web puede darle el precio si no utiliz ningn seguro.  - Puede imprimir el cupn correspondiente y llevarlo con su receta a la farmacia.  - Tambin puede pasar por nuestra oficina durante el horario de atencin regular y recoger una tarjeta de cupones de GoodRx.  - Si necesita que su receta se enve electrnicamente a una farmacia diferente, informe a nuestra oficina a travs de MyChart de [facility] o por telfono llamando al [phone] y presione la opcin 4.  

## 2022-04-16 NOTE — Progress Notes (Signed)
Follow-Up Visit   Subjective  Steve Bryant is a 89 y.o. male who presents for the following: Actinic Keratosis (Recheck previously treated precancerous skin lesions on the legs, hands, and face). The patient has spots, moles and lesions to be evaluated, some may be new or changing and the patient has concerns that these could be cancer.   The following portions of the chart were reviewed this encounter and updated as appropriate:   Tobacco  Allergies  Meds  Problems  Med Hx  Surg Hx  Fam Hx      Review of Systems:  No other skin or systemic complaints except as noted in HPI or Assessment and Plan.  Objective  Well appearing patient in no apparent distress; mood and affect are within normal limits.  A focused examination was performed including the face, hands, arms, legs, and scalp. Relevant physical exam findings are noted in the Assessment and Plan.  R thigh x 1, L pretibia x 2, R pretibia x 6, R wrist x 3, R thigh x 1, R elbow x 1, R 2nd finger x 1, R dorsum hand  x 1, L hand dorsum x 8, L 3rd finger x 1, L forearm x 1, scalp x 2, nose x 1 (29) Erythematous thin papules/macules with gritty scale.   L prox pretibia 0.6 cm pink papule with pigment globules     R cheek 0.5 cm skin colored papule with pigmented globule.        Assessment & Plan  AK (actinic keratosis) (29) R thigh x 1, L pretibia x 2, R pretibia x 6, R wrist x 3, R thigh x 1, R elbow x 1, R 2nd finger x 1, R dorsum hand  x 1, L hand dorsum x 8, L 3rd finger x 1, L forearm x 1, scalp x 2, nose x 1  Actinic keratoses are precancerous spots that appear secondary to cumulative UV radiation exposure/sun exposure over time. They are chronic with expected duration over 1 year. A portion of actinic keratoses will progress to squamous cell carcinoma of the skin. It is not possible to reliably predict which spots will progress to skin cancer and so treatment is recommended to prevent development of skin  cancer.  Recommend daily broad spectrum sunscreen SPF 30+ to sun-exposed areas, reapply every 2 hours as needed.  Recommend staying in the shade or wearing long sleeves, sun glasses (UVA+UVB protection) and wide brim hats (4-inch brim around the entire circumference of the hat). Call for new or changing lesions.  Prior to procedure, discussed risks of blister formation, small wound, skin dyspigmentation, or rare scar following cryotherapy. Recommend Vaseline ointment to treated areas while healing.    Destruction of lesion - R thigh x 1, L pretibia x 2, R pretibia x 6, R wrist x 3, R thigh x 1, R elbow x 1, R 2nd finger x 1, R dorsum hand  x 1, L hand dorsum x 8, L 3rd finger x 1, L forearm x 1, scalp x 2, nose x 1 Complexity: simple   Destruction method: cryotherapy   Informed consent: discussed and consent obtained   Timeout:  patient name, date of birth, surgical site, and procedure verified Lesion destroyed using liquid nitrogen: Yes   Region frozen until ice ball extended beyond lesion: Yes   Outcome: patient tolerated procedure well with no complications   Post-procedure details: wound care instructions given    Neoplasm of uncertain behavior of skin (2) L prox pretibia  Epidermal / dermal shaving  Lesion diameter (cm):  0.6 Informed consent: discussed and consent obtained   Timeout: patient name, date of birth, surgical site, and procedure verified   Procedure prep:  Patient was prepped and draped in usual sterile fashion Prep type:  Isopropyl alcohol Anesthesia: the lesion was anesthetized in a standard fashion   Anesthetic:  1% lidocaine w/ epinephrine 1-100,000 buffered w/ 8.4% NaHCO3 Instrument used: flexible razor blade   Hemostasis achieved with: pressure, aluminum chloride and electrodesiccation   Outcome: patient tolerated procedure well   Post-procedure details: sterile dressing applied and wound care instructions given   Dressing type: bandage and petrolatum     Destruction of lesion Complexity: extensive   Destruction method: electrodesiccation and curettage   Informed consent: discussed and consent obtained   Timeout:  patient name, date of birth, surgical site, and procedure verified Procedure prep:  Patient was prepped and draped in usual sterile fashion Prep type:  Isopropyl alcohol Anesthesia: the lesion was anesthetized in a standard fashion   Anesthetic:  1% lidocaine w/ epinephrine 1-100,000 buffered w/ 8.4% NaHCO3 Curettage performed in three different directions: Yes   Electrodesiccation performed over the curetted area: Yes   Curettage cycles:  3 Lesion length (cm):  0.6 Lesion width (cm):  0.6 Margin per side (cm):  0.2 Final wound size (cm):  1 Hemostasis achieved with:  pressure, aluminum chloride and electrodesiccation Outcome: patient tolerated procedure well with no complications   Post-procedure details: sterile dressing applied and wound care instructions given   Dressing type: bandage and petrolatum    Specimen 1 - Surgical pathology Differential Diagnosis: D48.5 r/o BCC  ED&C today Check Margins: No  R cheek  Skin / nail biopsy Type of biopsy: tangential   Informed consent: discussed and consent obtained   Timeout: patient name, date of birth, surgical site, and procedure verified   Procedure prep:  Patient was prepped and draped in usual sterile fashion Prep type:  Isopropyl alcohol Anesthesia: the lesion was anesthetized in a standard fashion   Anesthetic:  1% lidocaine w/ epinephrine 1-100,000 buffered w/ 8.4% NaHCO3 Instrument used: flexible razor blade   Hemostasis achieved with: pressure, aluminum chloride and electrodesiccation   Outcome: patient tolerated procedure well   Post-procedure details: sterile dressing applied and wound care instructions given   Dressing type: bandage and petrolatum    Specimen 2 - Surgical pathology Differential Diagnosis: D48.5 r/o BCC  Check Margins: No   Actinic  Damage - chronic, secondary to cumulative UV radiation exposure/sun exposure over time - diffuse scaly erythematous macules with underlying dyspigmentation - Recommend daily broad spectrum sunscreen SPF 30+ to sun-exposed areas, reapply every 2 hours as needed.  - Recommend staying in the shade or wearing long sleeves, sun glasses (UVA+UVB protection) and wide brim hats (4-inch brim around the entire circumference of the hat). - Call for new or changing lesions.  Seborrheic Keratoses - Stuck-on, waxy, tan-brown papules and/or plaques  - Benign-appearing - Discussed benign etiology and prognosis. - Observe - Call for any changes  Purpura - Chronic; persistent and recurrent.  Treatable, but not curable. - Violaceous macules and patches - Benign - Related to trauma, age, sun damage and/or use of blood thinners, chronic use of topical and/or oral steroids - Observe - Can use OTC arnica containing moisturizer such as Dermend Bruise Formula if desired - Call for worsening or other concerns  History of Squamous Cell Carcinoma of the Skin - No evidence of recurrence today - No lymphadenopathy -  Recommend regular full body skin exams - Recommend daily broad spectrum sunscreen SPF 30+ to sun-exposed areas, reapply every 2 hours as needed.  - Call if any new or changing lesions are noted between office visits  Return for UBSE and legs in 2-3 mths.  Luther Redo, CMA, am acting as scribe for Forest Gleason, MD .  Documentation: I have reviewed the above documentation for accuracy and completeness, and I agree with the above.  Forest Gleason, MD

## 2022-04-22 ENCOUNTER — Telehealth: Payer: Self-pay

## 2022-04-22 NOTE — Telephone Encounter (Signed)
Patient's daughter, Lysle Rubens, advised pathology showed [place]. Left prox pretibia already treated, scheduled EDC to treat right cheek. Lurlean Horns., RMA

## 2022-04-22 NOTE — Telephone Encounter (Signed)
-----   Message from Alfonso Patten, MD sent at 04/22/2022  4:58 PM EST ----- 1. Skin , left prox pretibia SUPERFICIAL AND NODULAR BASAL CELL CARCINOMA--> already treated with ED&C  2. Skin , right cheek BASAL CELL CARCINOMA, NODULAR PATTERN --> Suggest ED&C in the next 4-8 weeks to minimize surgery or more invasive treatment. However, Mohs surgery would give the highest cure rate. This type of skin cancer grows deeper and wider where it is, but usually does not spread or become life threatening.   ED&C has about an 85% cure rate and leaves a round wound the size of the skin cancer which is healed with ointment and a bandage over a few weeks time. It leaves a round white scar. No additional pathology is done. If the skin cancer were to come back, we would need to do a surgery to remove it.   Mohs involves cutting out right around the spot and then checking under the microscope to be sure the whole skin cancer is out. The cure rate is about 98-99%. It is done at another office outside of [place] ([place], [place], or [place]). Once the Mohs surgeon confirms the skin cancer is out, they will discuss the options to repair or heal the area. You must take it easy for about two weeks after surgery (no lifting over 10-15 lbs, avoid activity to get your heart rate and blood pressure up).    MAs please call with results and schedule.  Please let me know if they have questions for me.  Thank you!

## 2022-04-28 ENCOUNTER — Encounter: Payer: Self-pay | Admitting: Dermatology

## 2022-05-12 ENCOUNTER — Ambulatory Visit: Payer: Medicare Other | Admitting: Dermatology

## 2022-06-25 ENCOUNTER — Ambulatory Visit (INDEPENDENT_AMBULATORY_CARE_PROVIDER_SITE_OTHER): Payer: Medicare Other | Admitting: Dermatology

## 2022-06-25 ENCOUNTER — Encounter: Payer: Self-pay | Admitting: Dermatology

## 2022-06-25 VITALS — BP 188/86 | HR 61

## 2022-06-25 DIAGNOSIS — L578 Other skin changes due to chronic exposure to nonionizing radiation: Secondary | ICD-10-CM | POA: Diagnosis not present

## 2022-06-25 DIAGNOSIS — L814 Other melanin hyperpigmentation: Secondary | ICD-10-CM | POA: Diagnosis not present

## 2022-06-25 DIAGNOSIS — D229 Melanocytic nevi, unspecified: Secondary | ICD-10-CM

## 2022-06-25 DIAGNOSIS — L570 Actinic keratosis: Secondary | ICD-10-CM

## 2022-06-25 DIAGNOSIS — C4491 Basal cell carcinoma of skin, unspecified: Secondary | ICD-10-CM

## 2022-06-25 DIAGNOSIS — C44319 Basal cell carcinoma of skin of other parts of face: Secondary | ICD-10-CM | POA: Diagnosis not present

## 2022-06-25 DIAGNOSIS — L720 Epidermal cyst: Secondary | ICD-10-CM

## 2022-06-25 DIAGNOSIS — D485 Neoplasm of uncertain behavior of skin: Secondary | ICD-10-CM

## 2022-06-25 DIAGNOSIS — L821 Other seborrheic keratosis: Secondary | ICD-10-CM | POA: Diagnosis not present

## 2022-06-25 HISTORY — DX: Basal cell carcinoma of skin, unspecified: C44.91

## 2022-06-25 NOTE — Progress Notes (Signed)
Follow-Up Visit   Subjective  Steve Bryant is a 89 y.o. male who presents for the following: Bx proven BCC of the R cheek, patient is here today for [facility]. The patient has spots, moles and lesions to be evaluated, some may be new or changing and the patient has concerns that these could be cancer.   The following portions of the chart were reviewed this encounter and updated as appropriate:   Tobacco  Allergies  Meds  Problems  Med Hx  Surg Hx  Fam Hx      Review of Systems:  No other skin or systemic complaints except as noted in HPI or Assessment and Plan.  Objective  Well appearing patient in no apparent distress; mood and affect are within normal limits.  All skin waist up examined. As well as legs.  L forearm x 1, L preauricular x 1, L hand x 6, L med thigh x 1 (9) Erythematous thin papules/macules with gritty scale.   R cheek Pink bx site.       R preauricular 1.2 cm pink plaque       Assessment & Plan  AK (actinic keratosis) (9) L forearm x 1, L preauricular x 1, L hand x 6, L med thigh x 1  Hypertrophic   Actinic keratoses are precancerous spots that appear secondary to cumulative UV radiation exposure/sun exposure over time. They are chronic with expected duration over 1 year. A portion of actinic keratoses will progress to squamous cell carcinoma of the skin. It is not possible to reliably predict which spots will progress to skin cancer and so treatment is recommended to prevent development of skin cancer.  Recommend daily broad spectrum sunscreen SPF 30+ to sun-exposed areas, reapply every 2 hours as needed.  Recommend staying in the shade or wearing long sleeves, sun glasses (UVA+UVB protection) and wide brim hats (4-inch brim around the entire circumference of the hat). Call for new or changing lesions.  Prior to procedure, discussed risks of blister formation, small wound, skin dyspigmentation, or rare scar following cryotherapy. Recommend  Vaseline ointment to treated areas while healing.   Destruction of lesion - L forearm x 1, L preauricular x 1, L hand x 6, L med thigh x 1 Complexity: simple   Destruction method: cryotherapy   Informed consent: discussed and consent obtained   Timeout:  patient name, date of birth, surgical site, and procedure verified Lesion destroyed using liquid nitrogen: Yes   Region frozen until ice ball extended beyond lesion: Yes   Outcome: patient tolerated procedure well with no complications   Post-procedure details: wound care instructions given    Basal cell carcinoma (BCC) of skin of other part of face R cheek  Destruction of lesion Complexity: extensive   Destruction method: electrodesiccation and curettage   Informed consent: discussed and consent obtained   Timeout:  patient name, date of birth, surgical site, and procedure verified Procedure prep:  Patient was prepped and draped in usual sterile fashion Prep type:  Isopropyl alcohol Anesthesia: the lesion was anesthetized in a standard fashion   Anesthetic:  1% lidocaine w/ epinephrine 1-100,000 buffered w/ 8.4% NaHCO3 Curettage performed in three different directions: Yes   Electrodesiccation performed over the curetted area: Yes   Final wound size (cm):  0.8 Hemostasis achieved with:  pressure, aluminum chloride and electrodesiccation Outcome: patient tolerated procedure well with no complications   Post-procedure details: sterile dressing applied and wound care instructions given   Dressing type: bandage and  petrolatum    Bx proven - ED&C has about an 85% cure rate and leaves a round wound the size of the skin cancer which is healed with ointment and a bandage over a few weeks time. It leaves a round white scar. No additional pathology is done. If the skin cancer were to come back, we would need to do a surgery to remove it.   Mohs involves cutting out right around the spot and then checking under the microscope to be sure the  whole skin cancer is out. The cure rate is about 98-99%. It is done at another office outside of [place] ([place], [place], or [place]). Once the Mohs surgeon confirms the skin cancer is out, they will discuss the options to repair or heal the area. You must take it easy for about two weeks after surgery (no lifting over 10-15 lbs, avoid activity to get your heart rate and blood pressure up).   Pt prefers ED&C  Neoplasm of uncertain behavior of skin R preauricular  Epidermal / dermal shaving  Lesion diameter (cm):  1.2 Informed consent: discussed and consent obtained   Timeout: patient name, date of birth, surgical site, and procedure verified   Procedure prep:  Patient was prepped and draped in usual sterile fashion Prep type:  Isopropyl alcohol Anesthesia: the lesion was anesthetized in a standard fashion   Anesthetic:  1% lidocaine w/ epinephrine 1-100,000 buffered w/ 8.4% NaHCO3 Instrument used: flexible razor blade   Hemostasis achieved with: pressure, aluminum chloride and electrodesiccation   Outcome: patient tolerated procedure well   Post-procedure details: sterile dressing applied and wound care instructions given   Dressing type: bandage and petrolatum    Destruction of lesion Complexity: extensive   Destruction method: electrodesiccation and curettage   Informed consent: discussed and consent obtained   Timeout:  patient name, date of birth, surgical site, and procedure verified Procedure prep:  Patient was prepped and draped in usual sterile fashion Prep type:  Isopropyl alcohol Anesthesia: the lesion was anesthetized in a standard fashion   Anesthetic:  1% lidocaine w/ epinephrine 1-100,000 buffered w/ 8.4% NaHCO3 Curettage performed in three different directions: Yes   Electrodesiccation performed over the curetted area: Yes   Lesion length (cm):  1.2 Lesion width (cm):  1.2 Final wound size (cm):  2.1 Hemostasis achieved with:  pressure, aluminum chloride  and electrodesiccation Outcome: patient tolerated procedure well with no complications   Post-procedure details: sterile dressing applied and wound care instructions given   Dressing type: bandage and petrolatum    Specimen 1 - Surgical pathology Differential Diagnosis: D48.5 r/o BCC ED&C today  Check Margins: No  Pt prefers same day ED&C. He does not wish to consider Mohs.   EPIDERMAL INCLUSION CYST Exam: Subcutaneous nodule at R neck and back   Benign-appearing. Exam most consistent with an epidermal inclusion cyst. Discussed that a cyst is a benign growth that can grow over time and sometimes get irritated or inflamed. Recommend observation if it is not bothersome. Discussed option of surgical excision to remove it if it is growing, symptomatic, or other changes noted. Please call for new or changing lesions so they can be evaluated.  ACTINIC DAMAGE - chronic, secondary to cumulative UV radiation exposure/sun exposure over time - diffuse scaly erythematous macules with underlying dyspigmentation - Recommend daily broad spectrum sunscreen SPF 30+ to sun-exposed areas, reapply every 2 hours as needed.  - Recommend staying in the shade or wearing long sleeves, sun glasses (UVA+UVB protection) and  wide brim hats (4-inch brim around the entire circumference of the hat). - Call for new or changing lesions.  LENTIGINES Exam: scattered tan macules Due to sun exposure Treatment Plan: Benign-appearing, observe. Recommend daily broad spectrum sunscreen SPF 30+ to sun-exposed areas, reapply every 2 hours as needed.  Call for any changes  SEBORRHEIC KERATOSIS - Stuck-on, waxy, tan-brown papules and/or plaques  - Benign-appearing - Discussed benign etiology and prognosis. - Observe - Call for any changes  MELANOCYTIC NEVI Exam: Tan-brown and/or pink-flesh-colored symmetric macules and papules  Treatment Plan: Benign appearing on exam today. Recommend observation. Call clinic for new or  changing moles. Recommend daily use of broad spectrum spf 30+ sunscreen to sun-exposed areas.   Return in about 3 months (around 09/24/2022) for AK and skin CA follow up .  Maylene Roes, CMA, am acting as scribe for Darden Dates, MD .  Documentation: I have reviewed the above documentation for accuracy and completeness, and I agree with the above.  Darden Dates, MD

## 2022-06-25 NOTE — Patient Instructions (Signed)
Electrodesiccation and Curettage ("Scrape and Burn") Wound Care Instructions  Leave the original bandage on for 24 hours if possible.  If the bandage becomes soaked or soiled before that time, it is OK to remove it and examine the wound.  A small amount of post-operative bleeding is normal.  If excessive bleeding occurs, remove the bandage, place gauze over the site and apply continuous pressure (no peeking) over the area for 30 minutes. If this does not work, please call our clinic as soon as possible or page your doctor if it is after hours.   Once a day, cleanse the wound with soap and water. It is fine to shower. If a thick crust develops you may use a Q-tip dipped into dilute hydrogen peroxide (mix 1:1 with water) to dissolve it.  Hydrogen peroxide can slow the healing process, so use it only as needed.    After washing, apply petroleum jelly (Vaseline) or an antibiotic ointment if your doctor prescribed one for you, followed by a bandage.    For best healing, the wound should be covered with a layer of ointment at all times. If you are not able to keep the area covered with a bandage to hold the ointment in place, this may mean re-applying the ointment several times a day.  Continue this wound care until the wound has healed and is no longer open. It may take several weeks for the wound to heal and close.  Itching and mild discomfort is normal during the healing process.  If you have any discomfort, you can take Tylenol (acetaminophen) or ibuprofen as directed on the bottle. (Please do not take these if you have an allergy to them or cannot take them for another reason).  Some redness, tenderness and white or yellow material in the wound is normal healing.  If the area becomes very sore and red, or develops a thick yellow-green material (pus), it may be infected; please notify us.    Wound healing continues for up to one year following surgery. It is not unusual to experience pain in the scar  from time to time during the interval.  If the pain becomes severe or the scar thickens, you should notify the office.    A slight amount of redness in a scar is expected for the first six months.  After six months, the redness will fade and the scar will soften and fade.  The color difference becomes less noticeable with time.  If there are any problems, return for a post-op surgery check at your earliest convenience.  To improve the appearance of the scar, you can use silicone scar gel, cream, or sheets (such as Mederma or Serica) every night for up to one year. These are available over the counter (without a prescription).  Please call our office at [phone] for any questions or concerns.     Due to recent changes in healthcare laws, you may see results of your pathology and/or laboratory studies on MyChart before the doctors have had a chance to review them. We understand that in some cases there may be results that are confusing or concerning to you. Please understand that not all results are received at the same time and often the doctors may need to interpret multiple results in order to provide you with the best plan of care or course of treatment. Therefore, we ask that you please give us 2 business days to thoroughly review all your results before contacting the office for clarification. Should   we see a critical lab result, you will be contacted sooner.   If You Need Anything After Your Visit  If you have any questions or concerns for your doctor, please call our main line at [phone] and press option 4 to reach your doctor's medical assistant. If no one answers, please leave a voicemail as directed and we will return your call as soon as possible. Messages left after 4 pm will be answered the following business day.   You may also send us a message via MyChart. We typically respond to MyChart messages within 1-2 business days.  For prescription refills, please ask your  pharmacy to contact our office. Our fax number is [phone].  If you have an urgent issue when the clinic is closed that cannot wait until the next business day, you can page your doctor at the number below.    Please note that while we do our best to be available for urgent issues outside of office hours, we are not available 24/7.   If you have an urgent issue and are unable to reach us, you may choose to seek medical care at your doctor's office, retail clinic, urgent care center, or emergency room.  If you have a medical emergency, please immediately call 911 or go to the emergency department.  Pager Numbers  - Dr. Kowalski: [phone]  - Dr. Moye: [phone]  - Dr. Stewart: [phone]  In the event of inclement weather, please call our main line at [phone] for an update on the status of any delays or closures.  Dermatology Medication Tips: Please keep the boxes that topical medications come in in order to help keep track of the instructions about where and how to use these. Pharmacies typically print the medication instructions only on the boxes and not directly on the medication tubes.   If your medication is too expensive, please contact our office at [phone] option 4 or send us a message through MyChart.   We are unable to tell what your co-pay for medications will be in advance as this is different depending on your insurance coverage. However, we may be able to find a substitute medication at lower cost or fill out paperwork to get insurance to cover a needed medication.   If a prior authorization is required to get your medication covered by your insurance company, please allow us 1-2 business days to complete this process.  Drug prices often vary depending on where the prescription is filled and some pharmacies may offer cheaper prices.  The website www.goodrx.com contains coupons for medications through different pharmacies. The prices here do not  account for what the cost may be with help from insurance (it may be cheaper with your insurance), but the website can give you the price if you did not use any insurance.  - You can print the associated coupon and take it with your prescription to the pharmacy.  - You may also stop by our office during regular business hours and pick up a GoodRx coupon card.  - If you need your prescription sent electronically to a different pharmacy, notify our office through [place] MyChart or by phone at [phone] option 4.     Si Usted Necesita Algo Despus de Su Visita  Tambin puede enviarnos un mensaje a travs de MyChart. Por lo general respondemos a los mensajes de MyChart en el transcurso de 1 a 2 das hbiles.  Para renovar recetas, por favor pida a su farmacia que se ponga en   contacto con nuestra oficina. Nuestro nmero de fax es el [phone].  Si tiene un asunto urgente cuando la clnica est cerrada y que no puede esperar hasta el siguiente da hbil, puede llamar/localizar a su doctor(a) al nmero que aparece a continuacin.   Por favor, tenga en cuenta que aunque hacemos todo lo posible para estar disponibles para asuntos urgentes fuera del horario de oficina, no estamos disponibles las 24 horas del da, los 7 das de la semana.   Si tiene un problema urgente y no puede comunicarse con nosotros, puede optar por buscar atencin mdica  en el consultorio de su doctor(a), en una clnica privada, en un centro de atencin urgente o en una sala de emergencias.  Si tiene una emergencia mdica, por favor llame inmediatamente al 911 o vaya a la sala de emergencias.  Nmeros de bper  - Dr. Kowalski: [phone]  - Dra. Moye: [phone]  - Dra. Stewart: [phone]  En caso de inclemencias del tiempo, por favor llame a nuestra lnea principal al [phone] para una actualizacin sobre el estado de cualquier retraso o cierre.  Consejos para la medicacin en dermatologa: Por  favor, guarde las cajas en las que vienen los medicamentos de uso tpico para ayudarle a seguir las instrucciones sobre dnde y cmo usarlos. Las farmacias generalmente imprimen las instrucciones del medicamento slo en las cajas y no directamente en los tubos del medicamento.   Si su medicamento es muy caro, por favor, pngase en contacto con nuestra oficina llamando al [phone] y presione la opcin 4 o envenos un mensaje a travs de MyChart.   No podemos decirle cul ser su copago por los medicamentos por adelantado ya que esto es diferente dependiendo de la cobertura de su seguro. Sin embargo, es posible que podamos encontrar un medicamento sustituto a menor costo o llenar un formulario para que el seguro cubra el medicamento que se considera necesario.   Si se requiere una autorizacin previa para que su compaa de seguros cubra su medicamento, por favor permtanos de 1 a 2 das hbiles para completar este proceso.  Los precios de los medicamentos varan con frecuencia dependiendo del lugar de dnde se surte la receta y alguna farmacias pueden ofrecer precios ms baratos.  El sitio web www.goodrx.com tiene cupones para medicamentos de diferentes farmacias. Los precios aqu no tienen en cuenta lo que podra costar con la ayuda del seguro (puede ser ms barato con su seguro), pero el sitio web puede darle el precio si no utiliz ningn seguro.  - Puede imprimir el cupn correspondiente y llevarlo con su receta a la farmacia.  - Tambin puede pasar por nuestra oficina durante el horario de atencin regular y recoger una tarjeta de cupones de GoodRx.  - Si necesita que su receta se enve electrnicamente a una farmacia diferente, informe a nuestra oficina a travs de MyChart de [facility] o por telfono llamando al [phone] y presione la opcin 4.  

## 2022-07-01 ENCOUNTER — Telehealth: Payer: Self-pay

## 2022-07-01 NOTE — Telephone Encounter (Signed)
-----   Message from Sandi Mealy, MD sent at 07/01/2022  1:57 PM EDT ----- Skin , right preauricular BASAL CELL CARCINOMA, NODULAR PATTERN --> already treated with [facility], recheck at f/u, call if anything coming back before then  MAs please call. Thank you!

## 2022-07-01 NOTE — Telephone Encounter (Signed)
Left pt's daughter message to call for bx results/sh 

## 2022-07-02 ENCOUNTER — Encounter: Payer: Self-pay | Admitting: Dermatology

## 2022-07-06 ENCOUNTER — Telehealth: Payer: Self-pay

## 2022-07-06 NOTE — Telephone Encounter (Addendum)
Called and spoke with patient's daughter regarding bx results. Will recheck at patient's next follow up.   ----- Message from Sandi Mealy, MD sent at 07/01/2022  1:57 PM EDT ----- Skin , right preauricular BASAL CELL CARCINOMA, NODULAR PATTERN --> already treated with [facility], recheck at f/u, call if anything coming back before then  MAs please call. Thank you!

## 2022-07-06 NOTE — Telephone Encounter (Signed)
-----   Message from Virginia Moye, MD sent at 07/01/2022  1:57 PM EDT ----- Skin , right preauricular BASAL CELL CARCINOMA, NODULAR PATTERN --> already treated with ED&C, recheck at f/u, call if anything coming back before then  MAs please call. Thank you! 

## 2022-09-07 ENCOUNTER — Other Ambulatory Visit (INDEPENDENT_AMBULATORY_CARE_PROVIDER_SITE_OTHER): Payer: Self-pay | Admitting: Nurse Practitioner

## 2022-09-07 DIAGNOSIS — I714 Abdominal aortic aneurysm, without rupture, unspecified: Secondary | ICD-10-CM

## 2022-09-15 ENCOUNTER — Ambulatory Visit (INDEPENDENT_AMBULATORY_CARE_PROVIDER_SITE_OTHER): Payer: Medicare Other | Admitting: Vascular Surgery

## 2022-09-15 ENCOUNTER — Encounter (INDEPENDENT_AMBULATORY_CARE_PROVIDER_SITE_OTHER): Payer: Self-pay

## 2022-09-15 ENCOUNTER — Ambulatory Visit (INDEPENDENT_AMBULATORY_CARE_PROVIDER_SITE_OTHER): Payer: Medicare Other

## 2022-09-15 DIAGNOSIS — I714 Abdominal aortic aneurysm, without rupture, unspecified: Secondary | ICD-10-CM | POA: Diagnosis not present

## 2022-09-25 ENCOUNTER — Ambulatory Visit (INDEPENDENT_AMBULATORY_CARE_PROVIDER_SITE_OTHER): Payer: Medicare Other | Admitting: Vascular Surgery

## 2022-09-25 VITALS — BP 199/81 | HR 53 | Resp 18

## 2022-09-25 DIAGNOSIS — I7143 Infrarenal abdominal aortic aneurysm, without rupture: Secondary | ICD-10-CM | POA: Diagnosis not present

## 2022-09-25 DIAGNOSIS — E785 Hyperlipidemia, unspecified: Secondary | ICD-10-CM | POA: Diagnosis not present

## 2022-09-25 DIAGNOSIS — I10 Essential (primary) hypertension: Secondary | ICD-10-CM | POA: Diagnosis not present

## 2022-09-25 NOTE — Assessment & Plan Note (Signed)
blood pressure control important in reducing the progression of atherosclerotic disease and aneurysmal growth. On appropriate oral medications.  

## 2022-09-25 NOTE — Progress Notes (Unsigned)
MRN : [id number]  Steve Bryant is a 89 y.o. (Nov 19, 1925) male who presents with chief complaint of No chief complaint on file. [name]  History of Present Illness: Patient returns today in follow up of ***  Current Outpatient Medications  Medication Sig Dispense Refill   aspirin EC 81 MG tablet Take 81 mg by mouth daily.     clopidogrel (PLAVIX) 75 MG tablet Take 75 mg by mouth daily.     dorzolamide (TRUSOPT) 2 % ophthalmic solution Place 1 drop into the right eye daily with lunch.     dorzolamide-timolol (COSOPT) 22.3-6.8 MG/ML ophthalmic solution Place 1 drop into both eyes 2 (two) times daily.  2   gabapentin (NEURONTIN) 100 MG capsule Take 100 mg by mouth 2 (two) times daily with a meal.     latanoprost (XALATAN) 0.005 % ophthalmic solution Place 1 drop into the right eye at bedtime.     levothyroxine (SYNTHROID) 50 MCG tablet Take 50 mcg by mouth daily.     losartan (COZAAR) 25 MG tablet Take 1 tablet (25 mg total) by mouth daily. 30 tablet 2   Multiple Vitamin (MULTIVITAMIN) tablet Take 1 tablet by mouth daily.     MYRBETRIQ 25 MG TB24 tablet Take 25 mg by mouth daily.     pantoprazole (PROTONIX) 20 MG tablet Take 20 mg by mouth daily.     QUEtiapine (SEROQUEL) 25 MG tablet Take 25 mg by mouth 2 (two) times daily.     rosuvastatin (CRESTOR) 5 MG tablet Take 5 mg by mouth daily with supper.     No current facility-administered medications for this visit.    Past Medical History:  Diagnosis Date   AAA (abdominal aortic aneurysm) (HCC)    a.) Abd [place] 12/22/16: measured 4.5 cm. b.) CTA 12/27/18: fusiform infrarenal AAA measured 4.7 cm. c.) Abd [place] 07/04/19: measured 5.0 cm. d.) CTA 11/03/19: measured 4.9 cm. e.) CTA 10/21/20: interval increase in size to 5.4 x 5.0 cm extending to the iliac bifurcation.   Acute ischemic stroke (HCC) 10/16/2016   a.) MRI --> 13 x 14 mm ischemic, non-hemorrhagic, RIGHT paramedian ventral pons territory   Aortic atherosclerosis (HCC)    Arthritis     Basal cell carcinoma 06/25/2022   R preauricular, EDC done   BCC (basal cell carcinoma) 04/16/2022   right cheek, tx with [facility] 06/25/22   BCC (basal cell carcinoma) 04/16/2022   left prox pretibia, tx'd with EDC   Bilateral renal cysts    Bradycardia    Carotid artery disease (HCC)    a.) Doppler 07/02/09: RIGHT 50-75%, LEFT < 50%. b.) CTA 07/04/09: RIGHT 90%. LEFT 50-75%. c.) CTA 10/15/2016: RIGHT with no significant stenosis, LEFT 60%.   CHF (congestive heart failure) (HCC)    Chronic anticoagulation    a.) DAPT therapy (ASA + clopidogrel)   Coronary artery disease    DDD (degenerative disc disease), lumbar    Diastolic dysfunction 04/16/2020   a.) TTE 04/16/20: EF 60-65%; G1DD.   DOE (dyspnea on exertion)    Dyspnea    GERD (gastroesophageal reflux disease)    Glaucoma    Hyperlipidemia    Hypertension    Legally blind    Neck mass    a.) CT 07/04/09: 4.9 cm supraclavicular mass just posterior to clavicle; PET CT 07/05/09 showed mass to be FDG avid with max SUV of 6. b.) CT 10/15/16: measured 4 x 5 cm. c.) CT 04/15/20: measured 5.1 x 3.7 cm.   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 WD SCC. EDC   Squamous cell carcinoma of skin 06/15/2019   Right calf. KA-type. EDC   Squamous cell carcinoma of skin 11/13/2020   Right distal hand (in situ) - EDC   Squamous cell carcinoma of skin 11/13/2020   Right prox hand (in situ) - EDC   TIA (transient ischemic attack) 04/15/2020   Valvular regurgitation    a.) TTE 10/17/2016 : EF 55-60%; mild AR/MR. b.) TTE 04/16/2020: EF 60-65%; G1DD, mild AR, triv. MR. c.) TTE 12/09/20: EF >55%; mild AR/TR, triv. PR, mod. MR.    Past Surgical History:  Procedure Laterality Date   CERVICAL LAMINECTOMY N/A 05/2006   Fusion  C3-C7   ENDOVASCULAR REPAIR/STENT GRAFT N/A 12/25/2020   Procedure: ENDOVASCULAR REPAIR/STENT GRAFT;  Surgeon: Annice Needy, MD;  Location: [facility] INVASIVE CV LAB;  Service: Cardiovascular;  Laterality: N/A;   GLAUCOMA SURGERY  2003   INGUINAL HERNIA REPAIR Right 2007   TUMOR REMOVAL Right 1977   Thoracotomy with RUL pulmonary mass resection     Social History   Tobacco Use   Smoking status: Former    Current packs/day: 0.00    Average packs/day: 1 pack/day for 25.0 years (25.0 ttl pk-yrs)    Types: Cigarettes    Start date: 10    Quit date: 53    Years since quitting: 45.5   Smokeless tobacco: Never  Vaping Use   Vaping status: Never Used  Substance Use Topics   Alcohol use: Not Currently    Comment: occasionally   Drug use: No   ***    Family History  Problem Relation Age of Onset   Stroke Mother    Stroke Father    Cancer Brother    ***  Allergies  Allergen Reactions   Shellfish-Derived Products Nausea Only    Scallops     REVIEW OF SYSTEMS (Negative unless checked)  Constitutional: [] Weight loss  [] Fever  [] Chills Cardiac: [] Chest pain   [] Chest pressure   [x] Palpitations   [] Shortness of breath when laying flat   [] Shortness of breath at rest   [x] Shortness of breath with exertion. Vascular:  [] Pain in legs with walking   [] Pain in legs at rest   [] Pain in legs when laying flat   [] Claudication   [] Pain in feet when walking  [] Pain in feet at rest  [] Pain in feet when laying flat   [] History of DVT   [] Phlebitis   [] Swelling in legs   [] Varicose veins   [] Non-healing ulcers Pulmonary:   [] Uses home oxygen   [] Productive cough   [] Hemoptysis   [] Wheeze  [] COPD   [] Asthma Neurologic:  [] Dizziness  [] Blackouts   [] Seizures   [x] History of stroke   [] History of TIA  [] Aphasia   [] Temporary blindness   [] Dysphagia   [] Weakness or numbness in arms   [] Weakness or numbness in legs Musculoskeletal:  [x] Arthritis   [] Joint swelling   [] Joint pain   [x] Low back  pain Hematologic:  [] Easy bruising  [] Easy bleeding   [] Hypercoagulable state   [] Anemic   Gastrointestinal:  [] Blood in stool   [] Vomiting blood  [] Gastroesophageal reflux/heartburn   [] Abdominal pain Genitourinary:  [] Chronic kidney disease   [] Difficult urination  [] Frequent urination  [] Burning with urination   []   Hematuria Skin:  [] Rashes   [] Ulcers   [] Wounds Psychological:  [] History of anxiety   []  History of major depression.  Physical Examination  There were no vitals taken for this visit. Gen:  WD/WN, NAD Head: [place]/AT, No temporalis wasting. Ear/Nose/Throat: Hearing grossly intact, nares w/o erythema or drainage Eyes: Conjunctiva clear. Sclera non-icteric Neck: Supple.  Trachea midline Pulmonary:  Good air movement, no use of accessory muscles.  Cardiac: RRR, no JVD Vascular: *** Vessel Right Left  Radial Palpable Palpable                          PT Palpable Palpable  DP Palpable Palpable   Gastrointestinal: soft, non-tender/non-distended. No guarding/reflex.  Musculoskeletal: M/S 5/5 throughout.  No deformity or atrophy. *** edema. Neurologic: Sensation grossly intact in extremities.  Symmetrical.  Speech is fluent.  Psychiatric: Judgment intact, Mood & affect appropriate for pt's clinical situation. Dermatologic: No rashes or ulcers noted.  No cellulitis or open wounds.      Labs No results found for this or any previous visit (from the past 2160 hour(s)).  Radiology VAS [place] EVAR DUPLEX  Result Date: 09/16/2022 Endovascular Aortic Repair Study (EVAR) Patient Name:  Steve Bryant  Date of Exam:   09/15/2022 Medical Rec #: [id number]          Accession #:    [id number] Date of Birth: 29-Jun-1925         Patient Gender: M Patient Age:   68 years Exam Location:  [facility] Procedure:      VAS [place] EVAR DUPLEX Referring Phys: Sheppard Plumber --------------------------------------------------------------------------------  Indications: Follow up exam for  EVAR. Vascular Interventions: Evar placed 12/25/2020.  Comparison Study: 09/12/2021 Performing Technologist: Debbe Bales RVS  Examination Guidelines: A complete evaluation includes B-mode imaging, spectral Doppler, color Doppler, and power Doppler as needed of all accessible portions of each vessel. Bilateral testing is considered an integral part of a complete examination. Limited examinations for reoccurring indications may be performed as noted.  Endovascular Aortic Repair (EVAR): +----------+----------------+-------------------+-------------------+           Diameter AP (cm)Diameter Trans (cm)Velocities (cm/sec) +----------+----------------+-------------------+-------------------+ Aorta     5.41            5.27               30                  +----------+----------------+-------------------+-------------------+ Right Limb1.30            1.13               34                  +----------+----------------+-------------------+-------------------+ Left Limb 1.17            1.06               28                  +----------+----------------+-------------------+-------------------+  Summary: Abdominal Aorta: Patent endovascular aneurysm repair with no evidence of endoleak. The EVAR appears to be increased compared to prior study on 09/12/2021; measuring 5.41 cm today from a measurement of 4.79 cm.  *See table(s) above for measurements and observations.  Electronically signed by Levora Dredge MD on 09/16/2022 at 7:25:34 AM.    Final     Assessment/Plan  No problem-specific Assessment & Plan notes found for this encounter.    Festus Barren,  MD  09/25/2022 11:04 AM    This note was created with Dragon medical transcription system.  Any errors from dictation are purely unintentional

## 2022-09-25 NOTE — Assessment & Plan Note (Signed)
lipid control important in reducing the progression of atherosclerotic disease. Continue statin therapy  

## 2022-09-30 NOTE — Assessment & Plan Note (Signed)
His duplex study from earlier this month does demonstrate significant enlargement of the aortic aneurysm sac.  This is now measuring 5.4 cm.  1 year ago, it measured 4.8 cm.  No obvious endoleak was identified by duplex. This is a concerning finding and further interrogation at this point is indicated.  A CT angiogram of the abdomen and pelvis has been ordered and will be performed in the near future.  We will see him back following the CT scan to discuss the results and determine further treatment options.  We discussed the types of endoleak's that can be problematic, and discussed that a type II endoleak is the most common endoleak identified.  We also discussed briefly the general treatment options for type II endoleaks.

## 2022-10-01 ENCOUNTER — Telehealth (INDEPENDENT_AMBULATORY_CARE_PROVIDER_SITE_OTHER): Payer: Self-pay

## 2022-10-01 NOTE — Telephone Encounter (Signed)
Pt's daughter to schedule CT w/ no PA required

## 2022-10-05 ENCOUNTER — Ambulatory Visit
Admission: RE | Admit: 2022-10-05 | Discharge: 2022-10-05 | Disposition: A | Discharge status: Home / Self Care | Payer: Medicare Other | Source: Ambulatory Visit | Attending: Vascular Surgery | Admitting: Vascular Surgery

## 2022-10-05 DIAGNOSIS — I7143 Infrarenal abdominal aortic aneurysm, without rupture: Secondary | ICD-10-CM | POA: Diagnosis present

## 2022-10-05 MED ORDER — IOHEXOL 350 MG/ML SOLN
100.0000 mL | Freq: Once | INTRAVENOUS | Status: AC | PRN
  Administered 2022-10-05: 100 mL via INTRAVENOUS

## 2022-10-12 ENCOUNTER — Encounter: Payer: Self-pay | Admitting: Dermatology

## 2022-10-12 ENCOUNTER — Ambulatory Visit (INDEPENDENT_AMBULATORY_CARE_PROVIDER_SITE_OTHER): Payer: Medicare Other | Admitting: Dermatology

## 2022-10-12 VITALS — BP 127/76 | HR 61

## 2022-10-12 DIAGNOSIS — L729 Follicular cyst of the skin and subcutaneous tissue, unspecified: Secondary | ICD-10-CM

## 2022-10-12 DIAGNOSIS — L570 Actinic keratosis: Secondary | ICD-10-CM

## 2022-10-12 DIAGNOSIS — L821 Other seborrheic keratosis: Secondary | ICD-10-CM

## 2022-10-12 DIAGNOSIS — L814 Other melanin hyperpigmentation: Secondary | ICD-10-CM

## 2022-10-12 DIAGNOSIS — L578 Other skin changes due to chronic exposure to nonionizing radiation: Secondary | ICD-10-CM

## 2022-10-12 DIAGNOSIS — D692 Other nonthrombocytopenic purpura: Secondary | ICD-10-CM

## 2022-10-12 DIAGNOSIS — W908XXA Exposure to other nonionizing radiation, initial encounter: Secondary | ICD-10-CM | POA: Diagnosis not present

## 2022-10-12 DIAGNOSIS — I781 Nevus, non-neoplastic: Secondary | ICD-10-CM

## 2022-10-12 DIAGNOSIS — Z85828 Personal history of other malignant neoplasm of skin: Secondary | ICD-10-CM

## 2022-10-12 NOTE — Progress Notes (Signed)
Follow-Up Visit   Subjective  Steve Bryant is a 89 y.o. male who presents for the following: Basal cell carcinoma of R cheek and R preauricular that was treated with ED&C on 4/24. Also had Aks frozen last visit Pt has spot on right upper inner thigh that has been there for around 12 months but doesn't itch or bleed. He has some spots on his shoulders that have not been there long and get irritated.   The patient has spots, moles and lesions to be evaluated, some may be new or changing and the patient may have concern these could be cancer.   The following portions of the chart were reviewed this encounter and updated as appropriate: medications, allergies, medical history  Review of Systems:  No other skin or systemic complaints except as noted in HPI or Assessment and Plan.  Objective  Well appearing patient in no apparent distress; mood and affect are within normal limits.  A focused examination was performed of the following areas:   Relevant exam findings are noted in the Assessment and Plan.  left Dorsal Hand x3,left forearm x 3, right dorsal hand x1, right forearm x 2 (9) Keratotic macules    Assessment & Plan   HISTORY OF BASAL CELL CARCINOMA OF THE SKIN - No evidence of recurrence today R cheek, R preauricular - Recommend regular full body skin exams - Recommend daily broad spectrum sunscreen SPF 30+ to sun-exposed areas, reapply every 2 hours as needed.  - Call if any new or changing lesions are noted between office visits   ACTINIC DAMAGE - chronic, secondary to cumulative UV radiation exposure/sun exposure over time - diffuse scaly erythematous macules with underlying dyspigmentation - Recommend daily broad spectrum sunscreen SPF 30+ to sun-exposed areas, reapply every 2 hours as needed.  - Recommend staying in the shade or wearing long sleeves, sun glasses (UVA+UVB protection) and wide brim hats (4-inch brim around the entire circumference of the hat). -  Call for new or changing lesions.   TELANGIECTASIA Exam: dilated blood vessel(s), pink macule superior to previously treated BCC site R cheek  Treatment Plan: Benign appearing on exam Call for changes    SEBORRHEIC KERATOSIS - Stuck-on, waxy, tan-brown papules and/or plaque right medial upper thigh, shoulders and back - Benign-appearing - Discussed benign etiology and prognosis. - Observe - Call for any changes  LENTIGINES Exam: scattered tan macules Due to sun exposure Treatment Plan: Benign-appearing, observe. Recommend daily broad spectrum sunscreen SPF 30+ to sun-exposed areas, reapply every 2 hours as needed.  Call for any changes   Purpura - Chronic; persistent and recurrent.  Treatable, but not curable. - Violaceous macules and patches on both lower legs - Benign - Related to trauma, age, sun damage and/or use of blood thinners, chronic use of topical and/or oral steroids - Observe - Can use OTC arnica containing moisturizer such as Dermend Bruise Formula if desired - Call for worsening or other concerns   CYST Exam: firm nodule with punctum at right lower neck  Benign-appearing. Exam most consistent with an epidermal inclusion cyst. Discussed that a cyst is a benign growth that can grow over time and sometimes get irritated or inflamed. Recommend observation if it is not bothersome. Discussed option of surgical excision to remove it if it is growing, symptomatic, or other changes noted. Please call for new or changing lesions so they can be evaluated.   Assessment & Plan   AK (actinic keratosis) (9) left Dorsal Hand x3,left forearm  x 3, right dorsal hand x1, right forearm x 2  Hypertrophic  Actinic keratoses are precancerous spots that appear secondary to cumulative UV radiation exposure/sun exposure over time. They are chronic with expected duration over 1 year. A portion of actinic keratoses will progress to squamous cell carcinoma of the skin. It is not possible  to reliably predict which spots will progress to skin cancer and so treatment is recommended to prevent development of skin cancer.  Recommend daily broad spectrum sunscreen SPF 30+ to sun-exposed areas, reapply every 2 hours as needed.  Recommend staying in the shade or wearing long sleeves, sun glasses (UVA+UVB protection) and wide brim hats (4-inch brim around the entire circumference of the hat). Call for new or changing lesions.  Destruction of lesion - left Dorsal Hand x3,left forearm x 3, right dorsal hand x1, right forearm x 2 (9)  Destruction method: cryotherapy   Informed consent: discussed and consent obtained   Lesion destroyed using liquid nitrogen: Yes   Region frozen until ice ball extended beyond lesion: Yes   Outcome: patient tolerated procedure well with no complications   Post-procedure details: wound care instructions given   Additional details:  Prior to procedure, discussed risks of blister formation, small wound, skin dyspigmentation, or rare scar following cryotherapy. Recommend Vaseline ointment to treated areas while healing.   Cyst of skin  Senile purpura (HCC)  Actinic skin damage  Seborrheic keratosis  Lentigo  Telangiectasia  History of basal cell carcinoma (BCC) of skin  Return in about 6 months (around 04/14/2023) for UBSE.  I, Tillie Fantasia, CMA, am acting as scribe for Willeen Niece, MD.   Documentation: I have reviewed the above documentation for accuracy and completeness, and I agree with the above.  Willeen Niece, MD

## 2022-10-12 NOTE — Patient Instructions (Addendum)
Cryotherapy Aftercare  Wash gently with soap and water everyday.   Apply Vaseline and Band-Aid daily until healed.   Due to recent changes in healthcare laws, you may see results of your pathology and/or laboratory studies on MyChart before the doctors have had a chance to review them. We understand that in some cases there may be results that are confusing or concerning to you. Please understand that not all results are received at the same time and often the doctors may need to interpret multiple results in order to provide you with the best plan of care or course of treatment. Therefore, we ask that you please give [place] 2 business days to thoroughly review all your results before contacting the office for clarification. Should we see a critical lab result, you will be contacted sooner.   If You Need Anything After Your Visit  If you have any questions or concerns for your doctor, please call our main line at [phone] If no one answers, please leave a voicemail as directed and we will return your call as soon as possible. Messages left after 4 pm will be answered the following business day.   You may also send [place] a message via [place]. We typically respond to MyChart messages within 1-2 business days.  For prescription refills, please ask your pharmacy to contact our office. Our fax number is [phone].  If you have an urgent issue when the clinic is closed that cannot wait until the next business day, you can page your doctor at the number below.    Please note that while we do our best to be available for urgent issues outside of office hours, we are not available 24/7.   If you have an urgent issue and are unable to reach [place], you may choose to seek medical care at your doctor's office, retail clinic, urgent care center, or emergency room.  If you have a medical emergency, please immediately call 911 or go to the emergency department. In the event of inclement weather, please call our  main line at [phone] for an update on the status of any delays or closures.  Dermatology Medication Tips: Please keep the boxes that topical medications come in in order to help keep track of the instructions about where and how to use these. Pharmacies typically print the medication instructions only on the boxes and not directly on the medication tubes.   If your medication is too expensive, please contact our office at [phone] or send [place] a message through [place].   We are unable to tell what your co-pay for medications will be in advance as this is different depending on your insurance coverage. However, we may be able to find a substitute medication at lower cost or fill out paperwork to get insurance to cover a needed medication.   If a prior authorization is required to get your medication covered by your insurance company, please allow [place] 1-2 business days to complete this process.  Drug prices often vary depending on where the prescription is filled and some pharmacies may offer cheaper prices.  The website www.goodrx.com contains coupons for medications through different pharmacies. The prices here do not account for what the cost may be with help from insurance (it may be cheaper with your insurance), but the website can give you the price if you did not use any insurance.  - You can print the associated coupon and take it with your prescription to the pharmacy.  - You may also  stop by our office during regular business hours and pick up a GoodRx coupon card.  - If you need your prescription sent electronically to a different pharmacy, notify our office through [facility] or by phone at [phone]

## 2022-10-20 ENCOUNTER — Ambulatory Visit (INDEPENDENT_AMBULATORY_CARE_PROVIDER_SITE_OTHER): Payer: Medicare Other | Admitting: Vascular Surgery

## 2022-10-20 ENCOUNTER — Encounter (INDEPENDENT_AMBULATORY_CARE_PROVIDER_SITE_OTHER): Payer: Self-pay | Admitting: Vascular Surgery

## 2022-10-20 VITALS — BP 198/90 | HR 52 | Resp 18 | Ht 69.0 in | Wt 152.0 lb

## 2022-10-20 DIAGNOSIS — I10 Essential (primary) hypertension: Secondary | ICD-10-CM

## 2022-10-20 DIAGNOSIS — I7143 Infrarenal abdominal aortic aneurysm, without rupture: Secondary | ICD-10-CM | POA: Diagnosis not present

## 2022-10-20 DIAGNOSIS — E785 Hyperlipidemia, unspecified: Secondary | ICD-10-CM

## 2022-10-20 NOTE — H&P (View-Only) (Signed)
 MRN : [id number]  Steve Bryant is a 89 y.o. ([birth year]) male who presents with chief complaint of  Chief Complaint  Patient presents with   Follow-up    ct result  .  History of Present Illness: Patient returns today in follow up of his abdominal aortic aneurysm.  He is doing well today.  He denies any aneurysm related symptoms. Specifically, the patient denies new back or abdominal pain, or signs of peripheral embolization.  He has undergone a CT angiogram of the abdomen pelvis to evaluate his sac growth.  He is status post endovascular repair of his aneurysm almost 2 years ago.  I have independently reviewed his CT angiogram performed since his last visit.  This clearly demonstrates a type II endoleak and a maximal sac diameter which is now 5.7 cm.  This is nearly a centimeter larger than it was a year ago.  The radiologist report that it is unchanged is from 2022 which is prior to endovascular repair which is not necessarily a good comparison.  Current Outpatient Medications  Medication Sig Dispense Refill   aspirin EC 81 MG tablet Take 81 mg by mouth daily.     clopidogrel (PLAVIX) 75 MG tablet Take 75 mg by mouth daily.     dorzolamide (TRUSOPT) 2 % ophthalmic solution Place 1 drop into the right eye daily with lunch.     dorzolamide-timolol (COSOPT) 22.3-6.8 MG/ML ophthalmic solution Place 1 drop into both eyes 2 (two) times daily.  2   gabapentin (NEURONTIN) 100 MG capsule Take 100 mg by mouth 2 (two) times daily with a meal.     latanoprost (XALATAN) 0.005 % ophthalmic solution Place 1 drop into the right eye at bedtime.     levothyroxine (SYNTHROID) 50 MCG tablet Take 50 mcg by mouth daily.     losartan (COZAAR) 25 MG tablet Take 1 tablet (25 mg total) by mouth daily. 30 tablet 2   Multiple Vitamin (MULTIVITAMIN) tablet Take 1 tablet by mouth daily.     MYRBETRIQ 25 MG TB24 tablet Take 25 mg by mouth daily.     pantoprazole (PROTONIX) 20 MG tablet Take 20 mg by mouth daily.      pregabalin (LYRICA) 75 MG capsule Take 75 mg by mouth 3 (three) times daily.     QUEtiapine (SEROQUEL) 25 MG tablet Take 25 mg by mouth 2 (two) times daily.     rosuvastatin (CRESTOR) 5 MG tablet Take 5 mg by mouth daily with supper. (Patient not taking: Reported on 09/25/2022)     No current facility-administered medications for this visit.    Past Medical History:  Diagnosis Date   AAA (abdominal aortic aneurysm) (HCC)    a.) Abd [place] 12/22/16: measured 4.5 cm. b.) CTA 12/27/18: fusiform infrarenal AAA measured 4.7 cm. c.) Abd [place] 07/04/19: measured 5.0 cm. d.) CTA 11/03/19: measured 4.9 cm. e.) CTA 10/21/20: interval increase in size to 5.4 x 5.0 cm extending to the iliac bifurcation.   Acute ischemic stroke (HCC) 10/16/2016   a.) MRI --> 13 x 14 mm ischemic, non-hemorrhagic, RIGHT paramedian ventral pons territory   Aortic atherosclerosis (HCC)    Arthritis    Basal cell carcinoma 06/25/2022   R preauricular, EDC done   BCC (basal cell carcinoma) 04/16/2022   right cheek, tx with [facility] 06/25/22   BCC (basal cell carcinoma) 04/16/2022   left prox pretibia, tx'd with EDC   Bilateral renal cysts    Bradycardia    Carotid artery disease (  HCC)    a.) Doppler 07/02/09: RIGHT 50-75%, LEFT < 50%. b.) CTA 07/04/09: RIGHT 90%. LEFT 50-75%. c.) CTA 10/15/2016: RIGHT with no significant stenosis, LEFT 60%.   CHF (congestive heart failure) (HCC)    Chronic anticoagulation    a.) DAPT therapy (ASA + clopidogrel)   Coronary artery disease    DDD (degenerative disc disease), lumbar    Diastolic dysfunction 04/16/2020   a.) TTE 04/16/20: EF 60-65%; G1DD.   DOE (dyspnea on exertion)    Dyspnea    GERD (gastroesophageal reflux disease)    Glaucoma    Hyperlipidemia    Hypertension    Legally blind    Neck mass    a.) CT 07/04/09: 4.9 cm supraclavicular mass just posterior to clavicle; PET CT 07/05/09 showed mass to be FDG avid with max SUV of 6. b.) CT 10/15/16: measured 4 x 5 cm. c.) CT  04/15/20: measured 5.1 x 3.7 cm.   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 WD SCC. EDC   Squamous cell carcinoma of skin 06/15/2019   Right calf. KA-type. EDC   Squamous cell carcinoma of skin 11/13/2020   Right distal hand (in situ) - EDC   Squamous cell carcinoma of skin 11/13/2020   Right prox hand (in situ) - EDC   TIA (transient ischemic attack) 04/15/2020   Valvular regurgitation    a.) TTE 10/17/2016 : EF 55-60%; mild AR/MR. b.) TTE 04/16/2020: EF 60-65%; G1DD, mild AR, triv. MR. c.) TTE 12/09/20: EF >55%; mild AR/TR, triv. PR, mod. MR.    Past Surgical History:  Procedure Laterality Date   CERVICAL LAMINECTOMY N/A 05/2006   Fusion C3-C7   ENDOVASCULAR REPAIR/STENT GRAFT N/A 12/25/2020   Procedure: ENDOVASCULAR REPAIR/STENT GRAFT;  Surgeon: Annice Needy, MD;  Location: [facility] INVASIVE CV LAB;  Service: Cardiovascular;  Laterality: N/A;   GLAUCOMA SURGERY  2003   INGUINAL HERNIA REPAIR Right 2007   TUMOR REMOVAL Right 1977   Thoracotomy with RUL pulmonary mass resection     Social History   Tobacco Use   Smoking status: Former    Current packs/day: 0.00    Average packs/day: 1 pack/day for 25.0 years (25.0 ttl pk-yrs)    Types: Cigarettes    Start date: 45    Quit date: 53    Years since quitting: 45.6   Smokeless tobacco: Never  Vaping Use   Vaping status: Never Used  Substance Use Topics   Alcohol use: Not Currently    Comment: occasionally   Drug use: No       Family History  Problem Relation Age of Onset   Stroke Mother    Stroke Father    Cancer Brother      Allergies  Allergen Reactions   Shellfish-Derived Products Nausea Only    Scallops     REVIEW OF SYSTEMS (Negative unless checked)   Constitutional: [] Weight  loss  [] Fever  [] Chills Cardiac: [] Chest pain   [] Chest pressure   [x] Palpitations   [] Shortness of breath when laying flat   [] Shortness of breath at rest   [x] Shortness of breath with exertion. Vascular:  [] Pain in legs with walking   [] Pain in legs at rest   [] Pain in legs when laying flat   [] Claudication   []   Pain in feet when walking  [] Pain in feet at rest  [] Pain in feet when laying flat   [] History of DVT   [] Phlebitis   [] Swelling in legs   [] Varicose veins   [] Non-healing ulcers Pulmonary:   [] Uses home oxygen   [] Productive cough   [] Hemoptysis   [] Wheeze  [] COPD   [] Asthma Neurologic:  [] Dizziness  [] Blackouts   [] Seizures   [x] History of stroke   [] History of TIA  [] Aphasia   [] Temporary blindness   [] Dysphagia   [] Weakness or numbness in arms   [] Weakness or numbness in legs Musculoskeletal:  [x] Arthritis   [] Joint swelling   [] Joint pain   [x] Low back pain Hematologic:  [] Easy bruising  [] Easy bleeding   [] Hypercoagulable state   [] Anemic   Gastrointestinal:  [] Blood in stool   [] Vomiting blood  [] Gastroesophageal reflux/heartburn   [] Abdominal pain Genitourinary:  [] Chronic kidney disease   [] Difficult urination  [] Frequent urination  [] Burning with urination   [] Hematuria Skin:  [] Rashes   [] Ulcers   [] Wounds Psychological:  [] History of anxiety   []  History of major depression.  Physical Examination  BP (!) 198/90 (BP Location: Left Arm)   Pulse (!) 52   Resp 18   Ht 5\' 9"  (1.753 m)   Wt 152 lb (68.9 kg)   BMI 22.45 kg/m  Gen:  WD/WN, NAD. Appears younger than stated age. Head: [place]/AT, No temporalis wasting. Ear/Nose/Throat: Hearing grossly intact, nares w/o erythema or drainage Eyes: Conjunctiva clear. Sclera non-icteric. Visual acuity diminished Neck: Supple.  Trachea midline Pulmonary:  Good air movement, no use of accessory muscles.  Cardiac: irregular Vascular:  Vessel Right Left  Radial Palpable Palpable                                    Gastrointestinal: soft, non-tender/non-distended. No guarding/reflex.  Musculoskeletal: M/S 5/5 throughout.  No deformity or atrophy. No edema. Neurologic: Sensation grossly intact in extremities.  Symmetrical.  Speech is fluent.  Psychiatric: Judgment intact, Mood & affect appropriate for pt's clinical situation. Dermatologic: No rashes or ulcers noted.  No cellulitis or open wounds.      Labs No results found for this or any previous visit (from the past 2160 hour(s)).  Radiology CT Angio Abd/Pel w/ and/or w/o  Result Date: 10/07/2022 CLINICAL DATA:  89 year old male with history of abdominal aortic aneurysm status post endograft repair. Initial follow-up. EXAM: CT ANGIOGRAPHY ABDOMEN AND PELVIS WITH CONTRAST AND WITHOUT CONTRAST TECHNIQUE: Multidetector CT imaging of the abdomen and pelvis was performed using the standard protocol during bolus administration of intravenous contrast. Multiplanar reconstructed images and MIPs were obtained and reviewed to evaluate the vascular anatomy. RADIATION DOSE REDUCTION: This exam was performed according to the departmental dose-optimization program which includes automated exposure control, adjustment of the mA and/or kV according to patient size and/or use of iterative reconstruction technique. CONTRAST:  OMNIPAQUE IOHEXOL 350 MG/ML SOLN COMPARISON:  None Available. FINDINGS: VASCULAR Aorta: Status post aortobi-iliac endograft repair of infrarenal fusiform abdominal aortic aneurysm. There is arterial aneurysm sac flow at the level of the IMA ostium. The endograft is patent throughout and well apposed proximally and distally. The aneurysm sac measures up to 5.7 cm in greatest short axis dimension, unchanged by my measurements. The suprarenal, uncovered native abdominal aorta is similar in caliber with circumferential fibrofatty and calcific atherosclerotic changes without evidence of flow-limiting stenosis. Celiac: Moderate proximal stenosis,  likely secondary  to median arcuate ligament compression, unchanged. Patent distally. SMA: Mild ostial stenosis secondary to calcified atherosclerotic plaque. Patent distally. Marginal artery of [place] is patent. Renals: Single bilateral renal arteries with unchanged moderate ostial stenoses secondary to calcified atherosclerotic plaque. IMA: Patent without evidence of aneurysm, dissection, vasculitis or significant stenosis. Inflow: Endograft limbs extend through the common iliac arteries bilaterally. The bilateral, internal and external iliac arteries are patent with scattered atherosclerotic calcifications. Proximal Outflow: Bilateral common femoral and visualized portions of the superficial and profunda femoral arteries are patent without evidence of aneurysm, dissection, vasculitis or significant stenosis. Veins: The hepatic veins are widely patent. The portal system is widely patent and normal in caliber. The renal veins are patent bilaterally in standard anatomic configuration. No evidence of iliocaval thrombosis or anomaly. Review of the MIP images confirms the above findings. NON-VASCULAR Lower chest: Interval development of reticular opacities with possible honeycombing in the posterior bilateral lower lobes. There is mild lower lobe predominant interlobular septal thickening. Mild global cardiomegaly. Hepatobiliary: No focal liver abnormality is seen. Similar appearing gallbladder wall calcifications. No intra or extrahepatic biliary ductal dilation. Pancreas: Unremarkable. No pancreatic ductal dilatation or surrounding inflammatory changes. Spleen: No splenic injury or perisplenic hematoma. Again seen are a few punctate calcified granulomas. Adrenals/Urinary Tract: Adrenal glands are unremarkable. Similar appearing mild diffuse cortical thinning and scattered bilateral simple cortical cysts, none of which require additional follow-up. Kidneys are otherwise normal, without renal calculi, focal lesion, or  hydronephrosis. Bladder is unremarkable. Stomach/Bowel: Stomach is within normal limits. Appendix appears normal. Scattered colonic diverticula without surrounding inflammatory changes. No evidence of bowel wall thickening, distention, or inflammatory changes. Lymphatic: No abdominopelvic lymphadenopathy. Reproductive: Prostate is unremarkable. Other: Small fat containing left inguinal hernia, unchanged. Postsurgical changes after right inguinal hernia repair without evidence of recurrence. No abdominopelvic fluid collections. Musculoskeletal: Multilevel degenerative changes of the visualized thoracolumbar spine. No acute osseous abnormality. IMPRESSION: VASCULAR 1. Postsurgical changes after aortobi-iliac endograft repair of infrarenal fusiform abdominal aortic aneurysm. There is a type 2 endoleak arising from the inferior mesenteric artery, no evidence of significant aneurysm sac enlargement since 2022 comparison. 2.  Aortic Atherosclerosis (ICD10-I70.0). NON-VASCULAR 1. Indeterminate fibrotic changes about the visualized lung bases. Consider dedicated interstitial lung disease protocol CT chest for further characterization. 2. Similar appearing peripherally calcified gallbladder wall. Marliss Coots, MD [facility] Radiology Electronically Signed   By: Marliss Coots M.D.   On: 10/07/2022 11:28    Assessment/Plan  Essential hypertension, benign blood pressure control important in reducing the progression of atherosclerotic disease and aneurysmal growth. On appropriate oral medications.     Hyperlipidemia lipid control important in reducing the progression of atherosclerotic disease. Continue statin therapy     AAA (abdominal aortic aneurysm) without rupture I have independently reviewed his CT angiogram performed since his last visit.  This clearly demonstrates a type II endoleak and a maximal sac diameter which is now 5.7 cm.  This is nearly a centimeter  larger than it was a year ago.  The radiologist report that it is unchanged is from 2022 which is prior to endovascular repair which is not necessarily a good comparison.  Given the sac growth up to the preoperative size of his aneurysm, the type II endoleak will need to be addressed and treated.  We had a long discussion about the pathophysiology and natural history of type II endoleak's.  Many are benign, but when they cause sac growth, intervention is necessary.  His should be treated.  We will schedule him for endovascular treatment of the type II endoleak likely with coil embolization of branches.  Risks and benefits were discussed and he is agreeable to proceed.   Festus Barren, MD  10/20/2022 11:56 AM    This note was created with Dragon medical transcription system.  Any errors from dictation are purely unintentional

## 2022-10-20 NOTE — Progress Notes (Signed)
MRN : [id number]  Steve Bryant is a 89 y.o. ([birth year]) male who presents with chief complaint of  Chief Complaint  Patient presents with   Follow-up    ct result  .  History of Present Illness: Patient returns today in follow up of his abdominal aortic aneurysm.  He is doing well today.  He denies any aneurysm related symptoms. Specifically, the patient denies new back or abdominal pain, or signs of peripheral embolization.  He has undergone a CT angiogram of the abdomen pelvis to evaluate his sac growth.  He is status post endovascular repair of his aneurysm almost 2 years ago.  I have independently reviewed his CT angiogram performed since his last visit.  This clearly demonstrates a type II endoleak and a maximal sac diameter which is now 5.7 cm.  This is nearly a centimeter larger than it was a year ago.  The radiologist report that it is unchanged is from 2022 which is prior to endovascular repair which is not necessarily a good comparison.  Current Outpatient Medications  Medication Sig Dispense Refill   aspirin EC 81 MG tablet Take 81 mg by mouth daily.     clopidogrel (PLAVIX) 75 MG tablet Take 75 mg by mouth daily.     dorzolamide (TRUSOPT) 2 % ophthalmic solution Place 1 drop into the right eye daily with lunch.     dorzolamide-timolol (COSOPT) 22.3-6.8 MG/ML ophthalmic solution Place 1 drop into both eyes 2 (two) times daily.  2   gabapentin (NEURONTIN) 100 MG capsule Take 100 mg by mouth 2 (two) times daily with a meal.     latanoprost (XALATAN) 0.005 % ophthalmic solution Place 1 drop into the right eye at bedtime.     levothyroxine (SYNTHROID) 50 MCG tablet Take 50 mcg by mouth daily.     losartan (COZAAR) 25 MG tablet Take 1 tablet (25 mg total) by mouth daily. 30 tablet 2   Multiple Vitamin (MULTIVITAMIN) tablet Take 1 tablet by mouth daily.     MYRBETRIQ 25 MG TB24 tablet Take 25 mg by mouth daily.     pantoprazole (PROTONIX) 20 MG tablet Take 20 mg by mouth daily.      pregabalin (LYRICA) 75 MG capsule Take 75 mg by mouth 3 (three) times daily.     QUEtiapine (SEROQUEL) 25 MG tablet Take 25 mg by mouth 2 (two) times daily.     rosuvastatin (CRESTOR) 5 MG tablet Take 5 mg by mouth daily with supper. (Patient not taking: Reported on 09/25/2022)     No current facility-administered medications for this visit.    Past Medical History:  Diagnosis Date   AAA (abdominal aortic aneurysm) (HCC)    a.) Abd [place] 12/22/16: measured 4.5 cm. b.) CTA 12/27/18: fusiform infrarenal AAA measured 4.7 cm. c.) Abd [place] 07/04/19: measured 5.0 cm. d.) CTA 11/03/19: measured 4.9 cm. e.) CTA 10/21/20: interval increase in size to 5.4 x 5.0 cm extending to the iliac bifurcation.   Acute ischemic stroke (HCC) 10/16/2016   a.) MRI --> 13 x 14 mm ischemic, non-hemorrhagic, RIGHT paramedian ventral pons territory   Aortic atherosclerosis (HCC)    Arthritis    Basal cell carcinoma 06/25/2022   R preauricular, EDC done   BCC (basal cell carcinoma) 04/16/2022   right cheek, tx with [facility] 06/25/22   BCC (basal cell carcinoma) 04/16/2022   left prox pretibia, tx'd with EDC   Bilateral renal cysts    Bradycardia    Carotid artery disease (  HCC)    a.) Doppler 07/02/09: RIGHT 50-75%, LEFT < 50%. b.) CTA 07/04/09: RIGHT 90%. LEFT 50-75%. c.) CTA 10/15/2016: RIGHT with no significant stenosis, LEFT 60%.   CHF (congestive heart failure) (HCC)    Chronic anticoagulation    a.) DAPT therapy (ASA + clopidogrel)   Coronary artery disease    DDD (degenerative disc disease), lumbar    Diastolic dysfunction 04/16/2020   a.) TTE 04/16/20: EF 60-65%; G1DD.   DOE (dyspnea on exertion)    Dyspnea    GERD (gastroesophageal reflux disease)    Glaucoma    Hyperlipidemia    Hypertension    Legally blind    Neck mass    a.) CT 07/04/09: 4.9 cm supraclavicular mass just posterior to clavicle; PET CT 07/05/09 showed mass to be FDG avid with max SUV of 6. b.) CT 10/15/16: measured 4 x 5 cm. c.) CT  04/15/20: measured 5.1 x 3.7 cm.   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 WD SCC. EDC   Squamous cell carcinoma of skin 06/15/2019   Right calf. KA-type. EDC   Squamous cell carcinoma of skin 11/13/2020   Right distal hand (in situ) - EDC   Squamous cell carcinoma of skin 11/13/2020   Right prox hand (in situ) - EDC   TIA (transient ischemic attack) 04/15/2020   Valvular regurgitation    a.) TTE 10/17/2016 : EF 55-60%; mild AR/MR. b.) TTE 04/16/2020: EF 60-65%; G1DD, mild AR, triv. MR. c.) TTE 12/09/20: EF >55%; mild AR/TR, triv. PR, mod. MR.    Past Surgical History:  Procedure Laterality Date   CERVICAL LAMINECTOMY N/A 05/2006   Fusion C3-C7   ENDOVASCULAR REPAIR/STENT GRAFT N/A 12/25/2020   Procedure: ENDOVASCULAR REPAIR/STENT GRAFT;  Surgeon: Annice Needy, MD;  Location: [facility] INVASIVE CV LAB;  Service: Cardiovascular;  Laterality: N/A;   GLAUCOMA SURGERY  2003   INGUINAL HERNIA REPAIR Right 2007   TUMOR REMOVAL Right 1977   Thoracotomy with RUL pulmonary mass resection     Social History   Tobacco Use   Smoking status: Former    Current packs/day: 0.00    Average packs/day: 1 pack/day for 25.0 years (25.0 ttl pk-yrs)    Types: Cigarettes    Start date: 45    Quit date: 53    Years since quitting: 45.6   Smokeless tobacco: Never  Vaping Use   Vaping status: Never Used  Substance Use Topics   Alcohol use: Not Currently    Comment: occasionally   Drug use: No       Family History  Problem Relation Age of Onset   Stroke Mother    Stroke Father    Cancer Brother      Allergies  Allergen Reactions   Shellfish-Derived Products Nausea Only    Scallops     REVIEW OF SYSTEMS (Negative unless checked)   Constitutional: [] Weight  loss  [] Fever  [] Chills Cardiac: [] Chest pain   [] Chest pressure   [x] Palpitations   [] Shortness of breath when laying flat   [] Shortness of breath at rest   [x] Shortness of breath with exertion. Vascular:  [] Pain in legs with walking   [] Pain in legs at rest   [] Pain in legs when laying flat   [] Claudication   []   Pain in feet when walking  [] Pain in feet at rest  [] Pain in feet when laying flat   [] History of DVT   [] Phlebitis   [] Swelling in legs   [] Varicose veins   [] Non-healing ulcers Pulmonary:   [] Uses home oxygen   [] Productive cough   [] Hemoptysis   [] Wheeze  [] COPD   [] Asthma Neurologic:  [] Dizziness  [] Blackouts   [] Seizures   [x] History of stroke   [] History of TIA  [] Aphasia   [] Temporary blindness   [] Dysphagia   [] Weakness or numbness in arms   [] Weakness or numbness in legs Musculoskeletal:  [x] Arthritis   [] Joint swelling   [] Joint pain   [x] Low back pain Hematologic:  [] Easy bruising  [] Easy bleeding   [] Hypercoagulable state   [] Anemic   Gastrointestinal:  [] Blood in stool   [] Vomiting blood  [] Gastroesophageal reflux/heartburn   [] Abdominal pain Genitourinary:  [] Chronic kidney disease   [] Difficult urination  [] Frequent urination  [] Burning with urination   [] Hematuria Skin:  [] Rashes   [] Ulcers   [] Wounds Psychological:  [] History of anxiety   []  History of major depression.  Physical Examination  BP (!) 198/90 (BP Location: Left Arm)   Pulse (!) 52   Resp 18   Ht 5\' 9"  (1.753 m)   Wt 152 lb (68.9 kg)   BMI 22.45 kg/m  Gen:  WD/WN, NAD. Appears younger than stated age. Head: [place]/AT, No temporalis wasting. Ear/Nose/Throat: Hearing grossly intact, nares w/o erythema or drainage Eyes: Conjunctiva clear. Sclera non-icteric. Visual acuity diminished Neck: Supple.  Trachea midline Pulmonary:  Good air movement, no use of accessory muscles.  Cardiac: irregular Vascular:  Vessel Right Left  Radial Palpable Palpable                                    Gastrointestinal: soft, non-tender/non-distended. No guarding/reflex.  Musculoskeletal: M/S 5/5 throughout.  No deformity or atrophy. No edema. Neurologic: Sensation grossly intact in extremities.  Symmetrical.  Speech is fluent.  Psychiatric: Judgment intact, Mood & affect appropriate for pt's clinical situation. Dermatologic: No rashes or ulcers noted.  No cellulitis or open wounds.      Labs No results found for this or any previous visit (from the past 2160 hour(s)).  Radiology CT Angio Abd/Pel w/ and/or w/o  Result Date: 10/07/2022 CLINICAL DATA:  89 year old male with history of abdominal aortic aneurysm status post endograft repair. Initial follow-up. EXAM: CT ANGIOGRAPHY ABDOMEN AND PELVIS WITH CONTRAST AND WITHOUT CONTRAST TECHNIQUE: Multidetector CT imaging of the abdomen and pelvis was performed using the standard protocol during bolus administration of intravenous contrast. Multiplanar reconstructed images and MIPs were obtained and reviewed to evaluate the vascular anatomy. RADIATION DOSE REDUCTION: This exam was performed according to the departmental dose-optimization program which includes automated exposure control, adjustment of the mA and/or kV according to patient size and/or use of iterative reconstruction technique. CONTRAST:  OMNIPAQUE IOHEXOL 350 MG/ML SOLN COMPARISON:  None Available. FINDINGS: VASCULAR Aorta: Status post aortobi-iliac endograft repair of infrarenal fusiform abdominal aortic aneurysm. There is arterial aneurysm sac flow at the level of the IMA ostium. The endograft is patent throughout and well apposed proximally and distally. The aneurysm sac measures up to 5.7 cm in greatest short axis dimension, unchanged by my measurements. The suprarenal, uncovered native abdominal aorta is similar in caliber with circumferential fibrofatty and calcific atherosclerotic changes without evidence of flow-limiting stenosis. Celiac: Moderate proximal stenosis,  likely secondary  to median arcuate ligament compression, unchanged. Patent distally. SMA: Mild ostial stenosis secondary to calcified atherosclerotic plaque. Patent distally. Marginal artery of [place] is patent. Renals: Single bilateral renal arteries with unchanged moderate ostial stenoses secondary to calcified atherosclerotic plaque. IMA: Patent without evidence of aneurysm, dissection, vasculitis or significant stenosis. Inflow: Endograft limbs extend through the common iliac arteries bilaterally. The bilateral, internal and external iliac arteries are patent with scattered atherosclerotic calcifications. Proximal Outflow: Bilateral common femoral and visualized portions of the superficial and profunda femoral arteries are patent without evidence of aneurysm, dissection, vasculitis or significant stenosis. Veins: The hepatic veins are widely patent. The portal system is widely patent and normal in caliber. The renal veins are patent bilaterally in standard anatomic configuration. No evidence of iliocaval thrombosis or anomaly. Review of the MIP images confirms the above findings. NON-VASCULAR Lower chest: Interval development of reticular opacities with possible honeycombing in the posterior bilateral lower lobes. There is mild lower lobe predominant interlobular septal thickening. Mild global cardiomegaly. Hepatobiliary: No focal liver abnormality is seen. Similar appearing gallbladder wall calcifications. No intra or extrahepatic biliary ductal dilation. Pancreas: Unremarkable. No pancreatic ductal dilatation or surrounding inflammatory changes. Spleen: No splenic injury or perisplenic hematoma. Again seen are a few punctate calcified granulomas. Adrenals/Urinary Tract: Adrenal glands are unremarkable. Similar appearing mild diffuse cortical thinning and scattered bilateral simple cortical cysts, none of which require additional follow-up. Kidneys are otherwise normal, without renal calculi, focal lesion, or  hydronephrosis. Bladder is unremarkable. Stomach/Bowel: Stomach is within normal limits. Appendix appears normal. Scattered colonic diverticula without surrounding inflammatory changes. No evidence of bowel wall thickening, distention, or inflammatory changes. Lymphatic: No abdominopelvic lymphadenopathy. Reproductive: Prostate is unremarkable. Other: Small fat containing left inguinal hernia, unchanged. Postsurgical changes after right inguinal hernia repair without evidence of recurrence. No abdominopelvic fluid collections. Musculoskeletal: Multilevel degenerative changes of the visualized thoracolumbar spine. No acute osseous abnormality. IMPRESSION: VASCULAR 1. Postsurgical changes after aortobi-iliac endograft repair of infrarenal fusiform abdominal aortic aneurysm. There is a type 2 endoleak arising from the inferior mesenteric artery, no evidence of significant aneurysm sac enlargement since 2022 comparison. 2.  Aortic Atherosclerosis (ICD10-I70.0). NON-VASCULAR 1. Indeterminate fibrotic changes about the visualized lung bases. Consider dedicated interstitial lung disease protocol CT chest for further characterization. 2. Similar appearing peripherally calcified gallbladder wall. Marliss Coots, MD [facility] Radiology Electronically Signed   By: Marliss Coots M.D.   On: 10/07/2022 11:28    Assessment/Plan  Essential hypertension, benign blood pressure control important in reducing the progression of atherosclerotic disease and aneurysmal growth. On appropriate oral medications.     Hyperlipidemia lipid control important in reducing the progression of atherosclerotic disease. Continue statin therapy     AAA (abdominal aortic aneurysm) without rupture I have independently reviewed his CT angiogram performed since his last visit.  This clearly demonstrates a type II endoleak and a maximal sac diameter which is now 5.7 cm.  This is nearly a centimeter  larger than it was a year ago.  The radiologist report that it is unchanged is from 2022 which is prior to endovascular repair which is not necessarily a good comparison.  Given the sac growth up to the preoperative size of his aneurysm, the type II endoleak will need to be addressed and treated.  We had a long discussion about the pathophysiology and natural history of type II endoleak's.  Many are benign, but when they cause sac growth, intervention is necessary.  His should be treated.  We will schedule him for endovascular treatment of the type II endoleak likely with coil embolization of branches.  Risks and benefits were discussed and he is agreeable to proceed.   Festus Barren, MD  10/20/2022 11:56 AM    This note was created with Dragon medical transcription system.  Any errors from dictation are purely unintentional

## 2022-10-22 ENCOUNTER — Telehealth (INDEPENDENT_AMBULATORY_CARE_PROVIDER_SITE_OTHER): Payer: Self-pay

## 2022-10-22 NOTE — Telephone Encounter (Signed)
Spoke with the patient's daughter and he is scheduled with Dr. Wyn Quaker on 10/29/22 with a 1:00 pm arrival time to the [facility]. Pre-procedure instructions were discussed and will be sent to Mychart and mailed.

## 2022-10-29 ENCOUNTER — Other Ambulatory Visit: Payer: Self-pay

## 2022-10-29 ENCOUNTER — Encounter: Payer: Self-pay | Admitting: Vascular Surgery

## 2022-10-29 ENCOUNTER — Ambulatory Visit
Admission: RE | Admit: 2022-10-29 | Discharge: 2022-10-29 | Disposition: A | Discharge status: Home / Self Care | Payer: Medicare Other | Attending: Vascular Surgery | Admitting: Vascular Surgery

## 2022-10-29 ENCOUNTER — Encounter
Admission: RE | Disposition: A | Discharge status: Home / Self Care | Payer: Self-pay | Source: Home / Self Care | Attending: Vascular Surgery

## 2022-10-29 DIAGNOSIS — T82330A Leakage of aortic (bifurcation) graft (replacement), initial encounter: Secondary | ICD-10-CM

## 2022-10-29 DIAGNOSIS — I714 Abdominal aortic aneurysm, without rupture, unspecified: Secondary | ICD-10-CM | POA: Diagnosis present

## 2022-10-29 DIAGNOSIS — I10 Essential (primary) hypertension: Secondary | ICD-10-CM | POA: Insufficient documentation

## 2022-10-29 DIAGNOSIS — Z9889 Other specified postprocedural states: Secondary | ICD-10-CM

## 2022-10-29 DIAGNOSIS — Z87891 Personal history of nicotine dependence: Secondary | ICD-10-CM | POA: Insufficient documentation

## 2022-10-29 DIAGNOSIS — E785 Hyperlipidemia, unspecified: Secondary | ICD-10-CM | POA: Diagnosis not present

## 2022-10-29 HISTORY — PX: EMBOLIZATION (CATH LAB): CATH118239

## 2022-10-29 LAB — CREATININE, SERUM
Creatinine, Ser: 0.99 mg/dL (ref 0.61–1.24)
GFR, Estimated: >60 mL/min (ref >60)

## 2022-10-29 LAB — BUN: BUN: 18 mg/dL (ref 8–23)

## 2022-10-29 SURGERY — EMBOLIZATION
Anesthesia: Moderate Sedation

## 2022-10-29 MED ORDER — HYDROMORPHONE HCL 1 MG/ML IJ SOLN: 1.0000 mg | Freq: Once | INTRAMUSCULAR | Status: DC | PRN

## 2022-10-29 MED ORDER — CEFAZOLIN SODIUM-DEXTROSE 2-4 GM/100ML-% IV SOLN
INTRAVENOUS | Status: AC
  Filled 2022-10-29: qty 100

## 2022-10-29 MED ORDER — METHYLPREDNISOLONE SODIUM SUCC 125 MG IJ SOLR
125.0000 mg | Freq: Once | INTRAMUSCULAR | Status: AC | PRN
  Administered 2022-10-29: 125 mg via INTRAVENOUS

## 2022-10-29 MED ORDER — SODIUM CHLORIDE 0.9 % IV SOLN: INTRAVENOUS | Status: DC

## 2022-10-29 MED ORDER — MIDAZOLAM HCL 2 MG/ML PO SYRP: 8.0000 mg | ORAL_SOLUTION | Freq: Once | ORAL | Status: DC | PRN

## 2022-10-29 MED ORDER — MIDAZOLAM HCL 2 MG/2ML IJ SOLN
INTRAMUSCULAR | Status: DC | PRN
  Administered 2022-10-29: .5 mg via INTRAVENOUS

## 2022-10-29 MED ORDER — METHYLPREDNISOLONE SODIUM SUCC 125 MG IJ SOLR
INTRAMUSCULAR | Status: AC
  Filled 2022-10-29: qty 2

## 2022-10-29 MED ORDER — MIDAZOLAM HCL 2 MG/2ML IJ SOLN
INTRAMUSCULAR | Status: AC
  Filled 2022-10-29: qty 2

## 2022-10-29 MED ORDER — HEPARIN SODIUM (PORCINE) 1000 UNIT/ML IJ SOLN
INTRAMUSCULAR | Status: DC | PRN
  Administered 2022-10-29: 3000 [IU] via INTRAVENOUS

## 2022-10-29 MED ORDER — FENTANYL CITRATE (PF) 100 MCG/2ML IJ SOLN
INTRAMUSCULAR | Status: DC | PRN
  Administered 2022-10-29: 25 ug via INTRAVENOUS

## 2022-10-29 MED ORDER — ONDANSETRON HCL 4 MG/2ML IJ SOLN: 4.0000 mg | Freq: Four times a day (QID) | INTRAMUSCULAR | Status: DC | PRN

## 2022-10-29 MED ORDER — DIPHENHYDRAMINE HCL 50 MG/ML IJ SOLN
INTRAMUSCULAR | Status: AC
  Filled 2022-10-29: qty 1

## 2022-10-29 MED ORDER — IODIXANOL 320 MG/ML IV SOLN
INTRAVENOUS | Status: DC | PRN
  Administered 2022-10-29: 50 mL

## 2022-10-29 MED ORDER — LIDOCAINE-EPINEPHRINE (PF) 1 %-1:200000 IJ SOLN
INTRAMUSCULAR | Status: DC | PRN
  Administered 2022-10-29: 10 mL

## 2022-10-29 MED ORDER — FAMOTIDINE 20 MG PO TABS
40.0000 mg | ORAL_TABLET | Freq: Once | ORAL | Status: AC | PRN
  Administered 2022-10-29: 40 mg via ORAL

## 2022-10-29 MED ORDER — HEPARIN (PORCINE) IN NACL 1000-0.9 UT/500ML-% IV SOLN
INTRAVENOUS | Status: DC | PRN
  Administered 2022-10-29: 1000 mL

## 2022-10-29 MED ORDER — HYDRALAZINE HCL 20 MG/ML IJ SOLN
INTRAMUSCULAR | Status: AC
  Filled 2022-10-29: qty 1

## 2022-10-29 MED ORDER — HEPARIN SODIUM (PORCINE) 1000 UNIT/ML IJ SOLN
INTRAMUSCULAR | Status: AC
  Filled 2022-10-29: qty 10

## 2022-10-29 MED ORDER — HYDRALAZINE HCL 20 MG/ML IJ SOLN
INTRAMUSCULAR | Status: DC | PRN
  Administered 2022-10-29: 20 mg via INTRAVENOUS

## 2022-10-29 MED ORDER — CEFAZOLIN SODIUM-DEXTROSE 2-4 GM/100ML-% IV SOLN
2.0000 g | INTRAVENOUS | Status: AC
  Administered 2022-10-29: 2 g via INTRAVENOUS

## 2022-10-29 MED ORDER — DIPHENHYDRAMINE HCL 50 MG/ML IJ SOLN
50.0000 mg | Freq: Once | INTRAMUSCULAR | Status: AC | PRN
  Administered 2022-10-29: 50 mg via INTRAVENOUS

## 2022-10-29 MED ORDER — FENTANYL CITRATE (PF) 100 MCG/2ML IJ SOLN
INTRAMUSCULAR | Status: AC
  Filled 2022-10-29: qty 2

## 2022-10-29 MED ORDER — FAMOTIDINE 20 MG PO TABS
ORAL_TABLET | ORAL | Status: AC
  Filled 2022-10-29: qty 2

## 2022-10-29 SURGICAL SUPPLY — 16 items
CATH ANGIO 5F PIGTAIL 65CM (CATHETERS) IMPLANT
CATH BEACON 5 .035 40 KMP TP (CATHETERS) IMPLANT
CATH MICROCATH PRGRT 2.8F 130 (MICROCATHETER) IMPLANT
CATH VS15FR (CATHETERS) IMPLANT
COIL 400 COMPLEX SOFT 3X15CM (Vascular Products) IMPLANT
COVER PROBE ULTRASOUND 5X96 (MISCELLANEOUS) IMPLANT
DEVICE OCCLUSION PODJ15 (Vascular Products) IMPLANT
DEVICE STARCLOSE SE CLOSURE (Vascular Products) IMPLANT
GLIDEWIRE ANGLED SS 035X260CM (WIRE) IMPLANT
HANDLE DETACHMENT COIL (MISCELLANEOUS) IMPLANT
MICROCATH PROGREAT 2.8F 130CM (MICROCATHETER) ×1
OCCLUSION DEVICE PODJ15 (Vascular Products) ×2 IMPLANT
PACK ANGIOGRAPHY (CUSTOM PROCEDURE TRAY) ×1 IMPLANT
SHEATH BRITE TIP 5FRX11 (SHEATH) IMPLANT
TUBING CONTRAST HIGH PRESS 72 (TUBING) IMPLANT
WIRE GUIDERIGHT .035X150 (WIRE) IMPLANT

## 2022-10-29 NOTE — Interval H&P Note (Signed)
History and Physical Interval Note:  10/29/2022 1:53 PM  Steve Bryant  has presented today for surgery, with the diagnosis of Endoleak Embolization   AAA.  The various methods of treatment have been discussed with the patient and family. After consideration of risks, benefits and other options for treatment, the patient has consented to  Procedure(s): EMBOLIZATION (N/A) as a surgical intervention.  The patient's history has been reviewed, patient examined, no change in status, stable for surgery.  I have reviewed the patient's chart and labs.  Questions were answered to the patient's satisfaction.     Festus Barren

## 2022-10-29 NOTE — Op Note (Addendum)
[facility]  Percutaneous Study/Intervention Procedural Note   Date of Surgery: 10/29/2022  Surgeon(s): Festus Barren   Assistants:none  Pre-operative Diagnosis: AAA with type 2 endoleak  Post-operative diagnosis:  Same  Procedure(s) Performed:             1.  Ultrasound guidance for vascular access bilateral femoral arteries             2.  Catheter placement into tertiary right hypogastric artery branches from right femoral approach and into tertiary left hypogastric artery branches from left femoral approach             3.  Aortogram and lateral pelvic angiograms in both hypogastric arteries             4.  Coil embolization of a tertiary right hypogastric artery branch with a 3 mm diameter by 15 cm length soft coil             5.  Coil embolization of a tertiary left hypogastric artery branch with a 3 mm diameter by 15 cm length soft coil and two 15 cm packing coils  6.  StarClose closure device bilateral femoral arteries  EBL: 10 cc  Contrast: 50 cc  Fluoro Time: 11.3 min  Moderate Conscious Sedation time: approximately 64 minutes using 0.5 mg of Versed and 25 mcg of Fentanyl              Indications:  Patient is a 89 y.o. male with a type II endoleak status post endovascular aneurysm repair in the past. The patient has noninvasive study showing growth of the aneurysm sac followed by CT scan which confirmed this and suggested a type II endoleak. The patient is brought in for angiography for further evaluation and potential treatment. Risks and benefits are discussed and informed consent is obtained  Procedure:  The patient was identified and appropriate procedural time out was performed.  The patient was then placed supine on the table and prepped and draped in the usual sterile fashion. Moderate conscious sedation was administered throughout the procedure with a face to face encounter with the patient throughout and with my supervision of the RN administering  medicines and monitoring the patients vital signs and mental status throughout from the start of the procedure until the patient was taken to the recovery room.   Ultrasound was used to evaluate the right common femoral artery.  It was patent .  A digital ultrasound image was acquired.  A Seldinger needle was used to access the right common femoral artery under direct ultrasound guidance and a permanent image was performed.  A 0.035 J wire was advanced without resistance and a 5Fr sheath was placed.  Pigtail catheter was placed into the aorta at the L1 level above the renal arteries and an AP aortogram was performed. This demonstrated normal renal arteries and a patent stent graft without an obvious type I or III endoleak.  The iliac limbs were both patent.  Both hypogastric arteries and external iliac arteries appeared patent.  On the initial images, no obvious endoleak was seen.  I then elected to image to the right hypogastric artery to look for a feeding branch.  Imaging through the right femoral sheath required a steep caudal and LAO projection to show the origin of the internal iliac artery.  This was then cannulated with a V S1 catheter after a rim and a Kumpe catheter could not gain good catheter access.  Through the V S1 catheter,  there was a small branch that was coming off as a third order branch that then fed into the aneurysm sac creating a potential type II endoleak.  Tediously, I used the prograde microcatheter to navigate out this branch into its distal portion about 10 cm from the aneurysm sac.  I then placed a 3 mm diameter by 15 cm length coil that completely embolize this branch and there was no further flow going into the aneurysm sac.  This was a relatively small branch and I felt it was important to look on the left side to see if there was a larger branch feeding the aneurysm sac.  Ultrasound was used to visualize the left femoral artery which was found to be patent.  It was then accessed  under direct ultrasound guidance without difficulty with a Seldinger needle.  A J-wire and 5 French sheath were then placed.  A caudal and RAO projection were performed which showed the origin of the left hypogastric artery.  This was then cannulated with a rim catheter where selective imaging of the left hypogastric artery showed a large tertiary branch which fed into the aneurysm sac.  Tediously, using the prograde microcatheter was able to navigate out this tertiary branch and get within about 4 to 5 cm of the aneurysm sac.  I then began with coil embolization.  A 3 mm diameter by 15 cm length soft Ruby coil was then placed followed by two 15 cm packing coils which completely embolize this branch and no further blush was seen in the aneurysm sac on injection through the rim catheter on the left hypogastric artery.  I elected to terminate the procedure. The sheath was removed and StarClose closure device was deployed in the left femoral artery with excellent hemostatic result.  Similarly, StarClose closure device was deployed in the right femoral artery with excellent hemostatic result.  The patient was taken to the recovery room in stable condition having tolerated the procedure well.  Findings:               Patent stent graft in the aorta without a type I or III endoleak.  Small branch of a tertiary right hypogastric artery branch that seem to be creating a type II endoleak.  Larger branch of the left hypogastric artery tertiary branch that was creating a type II endoleak.  Both of these branches were successfully treated with coil embolization.   Disposition: Patient was taken to the recovery room in stable condition having tolerated the procedure well.  Complications: None  Festus Barren 10/29/2022 4:15 PM     This note was created with Dragon Medical transcription system. Any errors in dictation are purely unintentional.

## 2022-10-30 ENCOUNTER — Encounter: Payer: Self-pay | Admitting: Vascular Surgery

## 2022-11-12 ENCOUNTER — Encounter (INDEPENDENT_AMBULATORY_CARE_PROVIDER_SITE_OTHER): Payer: Self-pay | Admitting: Vascular Surgery

## 2023-01-06 ENCOUNTER — Encounter: Payer: Self-pay | Admitting: Dermatology

## 2023-01-06 ENCOUNTER — Ambulatory Visit (INDEPENDENT_AMBULATORY_CARE_PROVIDER_SITE_OTHER): Payer: Medicare Other | Admitting: Dermatology

## 2023-01-06 DIAGNOSIS — Z85828 Personal history of other malignant neoplasm of skin: Secondary | ICD-10-CM

## 2023-01-06 DIAGNOSIS — C44729 Squamous cell carcinoma of skin of left lower limb, including hip: Secondary | ICD-10-CM

## 2023-01-06 DIAGNOSIS — D489 Neoplasm of uncertain behavior, unspecified: Secondary | ICD-10-CM

## 2023-01-06 DIAGNOSIS — D492 Neoplasm of unspecified behavior of bone, soft tissue, and skin: Secondary | ICD-10-CM | POA: Diagnosis not present

## 2023-01-06 NOTE — Progress Notes (Signed)
   Follow-Up Visit   Subjective  Steve Bryant is a 89 y.o. male who presents for the following: Spot  The patient has spots, moles and lesions to be evaluated, some may be new or changing and the patient may have concern these could be cancer.  New and changing spot on left lower leg. Hx of SCC and BCC. Spot does not itch or bleed   The following portions of the chart were reviewed this encounter and updated as appropriate: medications, allergies, medical history  Review of Systems:  No other skin or systemic complaints except as noted in HPI or Assessment and Plan.  Objective  Well appearing patient in no apparent distress; mood and affect are within normal limits.  A focused examination was performed of the following areas: L Lower Leg  Relevant physical exam findings are noted in the Assessment and Plan.  Left Lower Leg - Anterior 14 x 13 mm keratotic plaque         Assessment & Plan   Neoplasm of uncertain behavior Left Lower Leg - Anterior  Skin / nail biopsy Type of biopsy: tangential   Informed consent: discussed and consent obtained   Timeout: patient name, date of birth, surgical site, and procedure verified   Patient was prepped and draped in usual sterile fashion: Area prepped with alcohol. Anesthesia: the lesion was anesthetized in a standard fashion   Anesthetic:  1% lidocaine w/ epinephrine 1-100,000 buffered w/ 8.4% NaHCO3 Instrument used: DermaBlade   Hemostasis achieved with: pressure, aluminum chloride and electrodesiccation   Outcome: patient tolerated procedure well   Post-procedure details: wound care instructions given   Post-procedure details comment:  Ointment and small bandage applied  Specimen 1 - Surgical pathology Differential Diagnosis: r/o SCC  Check Margins: No  Discussed that base is positive for visible tumour and Mohs will be the best treatment. Mohs offered in [place]. Patient will be able to get there     Return  for follow up in march-already scheduled.  I, Germaine Pomfret, CMA, am acting as scribe for Elie Goody, MD.   Documentation: I have reviewed the above documentation for accuracy and completeness, and I agree with the above.  Elie Goody, MD

## 2023-01-06 NOTE — Patient Instructions (Signed)
Wound Care Instructions  Cleanse wound gently with soap and water once a day then pat dry with clean gauze. Apply a thin coat of Petrolatum (petroleum jelly, "Vaseline") over the wound (unless you have an allergy to this). We recommend that you use a new, sterile tube of Vaseline. Do not pick or remove scabs. Do not remove the yellow or white "healing tissue" from the base of the wound.  Cover the wound with fresh, clean, nonstick gauze and secure with paper tape. You may use Band-Aids in place of gauze and tape if the wound is small enough, but would recommend trimming much of the tape off as there is often too much. Sometimes Band-Aids can irritate the skin.  You should call the office for your biopsy report after 1 week if you have not already been contacted.  If you experience any problems, such as abnormal amounts of bleeding, swelling, significant bruising, significant pain, or evidence of infection, please call the office immediately.  FOR ADULT SURGERY PATIENTS: If you need something for pain relief you may take 1 extra strength Tylenol (acetaminophen) AND 2 Ibuprofen (200mg each) together every 4 hours as needed for pain. (do not take these if you are allergic to them or if you have a reason you should not take them.) Typically, you may only need pain medication for 1 to 3 days.    

## 2023-01-11 DIAGNOSIS — C4492 Squamous cell carcinoma of skin, unspecified: Secondary | ICD-10-CM

## 2023-01-11 LAB — SURGICAL PATHOLOGY

## 2023-01-26 ENCOUNTER — Ambulatory Visit: Payer: Medicare Other | Admitting: Dermatology

## 2023-01-26 ENCOUNTER — Encounter: Payer: Self-pay | Admitting: Dermatology

## 2023-01-26 VITALS — BP 131/88 | HR 100 | Temp 98.2°F

## 2023-01-26 DIAGNOSIS — C44729 Squamous cell carcinoma of skin of left lower limb, including hip: Secondary | ICD-10-CM

## 2023-01-26 DIAGNOSIS — C4492 Squamous cell carcinoma of skin, unspecified: Secondary | ICD-10-CM

## 2023-01-26 DIAGNOSIS — L814 Other melanin hyperpigmentation: Secondary | ICD-10-CM

## 2023-01-26 DIAGNOSIS — L579 Skin changes due to chronic exposure to nonionizing radiation, unspecified: Secondary | ICD-10-CM

## 2023-01-26 MED ORDER — MUPIROCIN 2 % EX OINT: 1.0000 | TOPICAL_OINTMENT | Freq: Every day | CUTANEOUS | 2 refills | Status: DC

## 2023-01-26 MED ORDER — TRAMADOL HCL 50 MG PO TABS
50.0000 mg | ORAL_TABLET | Freq: Four times a day (QID) | ORAL | 0 refills | Status: DC | PRN
Start: 2023-01-26 — End: 2023-12-28

## 2023-01-26 NOTE — Progress Notes (Signed)
Follow-Up Visit   Subjective  Steve Bryant is a 89 y.o. male who presents for the following: Mohs of left lower leg  The following portions of the chart were reviewed this encounter and updated as appropriate: medications, allergies, medical history  Review of Systems:  No other skin or systemic complaints except as noted in HPI or Assessment and Plan.  Objective  Well appearing patient in no apparent distress; mood and affect are within normal limits.  A focused examination was performed of the following areas: Left lower leg Relevant physical exam findings are noted in the Assessment and Plan.   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 Yes   Was the anticoagulation regimen changed prior to Mohs? No    Procedure Details: Timeout: pre-procedure verification complete Procedure Prep: patient was prepped and draped in usual sterile fashion Prep type: chlorhexidine Frozen section biopsy performed: No   Specimen debulked: No   Pre-Op diagnosis: squamous cell carcinoma SCC subtype: KA type MohsAIQ Surgical site (if tumor spans multiple areas, please select predominant area): shin Surgery side: left Surgical site (from skin exam): Left Lower Leg Pre-operative length (cm): 2.8 Pre-operative width (cm): 2.5 Indications for Mohs surgery: anatomic location where tissue conservation is critical Previously treated? No    Micrographic Surgery Details: Post-operative length (cm): 3 Post-operative width (cm): 2.8 Number of Mohs stages: 1 Is this a complex case (associate members only): No    Patient tolerance of procedure: tolerated well, no immediate complications  Reconstruction: Was the defect reconstructed? Yes   Was reconstruction performed  by the same Mohs surgeon? Yes   Setting of reconstruction: outpatient office  Opioids: Did the patient recieve a prescription for opioid/narcotic related to Mohs surgery? Yes   Indications for opioid/narcotics: patient required additional pain relief despite trial of non-opioid analgesia  Antibiotics: Does patient meet AHA guidelines for orthopedic prophylaxis?: No   Were antibiotics given on the day of surgery?: No   Did surgery breach mucosa, expose cartilage/bone, involve an area of lymphedema/inflamed/infected tissue? No    Skin repair  Informed consent: discussed and consent obtained   Timeout: patient name, date of birth, surgical site, and procedure verified   Procedure prep:  Patient was prepped and draped in usual sterile fashion Prep type:  Chlorhexidine Anesthesia: the lesion was anesthetized in a standard fashion   Undermining: edges could be approximated without difficulty   Fine/surface layer approximation (top stitches):  Hemostasis achieved with: suture and electrodesiccation Outcome: patient tolerated procedure well with no complications   Post-procedure details: sterile dressing applied and wound care instructions given   Dressing type: pressure dressing and petrolatum    Related Medications mupirocin ointment (BACTROBAN) 2 % Apply 1 Application topically daily. To wound under a bandage  traMADol (ULTRAM) 50 MG tablet Take 1 tablet (50 mg total) by mouth every 6 (six) hours as needed for up to 8 doses.   Return in about 4 weeks (around 02/23/2023).  Owens Shark, CMA, am acting as scribe for Gwenith Daily, MD.    01/26/2023  HISTORY OF PRESENT ILLNESS  Steve Bryant is seen in consultation at the request of Dr. Herbert Deaner for biopsy-proven SCC-KA type on the left lower leg. They note that the area has  been present for about 2-3 months increasing in size with time.  There is no history of previous treatment.  Reports no other new or changing  lesions and has no other complaints today. He is accompanied by his daughter  Medications and allergies: see patient chart.  Review of systems: Reviewed 8 systems and notable for the above skin cancer.  All other systems reviewed are unremarkable/negative, unless noted in the HPI. Past medical history, surgical history, family history, social history were also reviewed and are noted in the chart/questionnaire.    PHYSICAL EXAMINATION  General: Well-appearing, in no acute distress, alert and oriented x 4. Vitals reviewed in chart (if available).   Skin: Exam reveals a 2.8 x 2.5 cm erythematous papule and biopsy scar on the left lower leg. There are rhytids, telangiectasias, and lentigines, consistent with photodamage.   Biopsy report(s) reviewed, confirming the diagnosis.   ASSESSMENT  1) Squamous Cell Carcinoma- KA type- left lower leg 2) photodamage 3) solar lentigines   PLAN   1. Due to location, size, histology, or recurrence and the likelihood of subclinical extension as well as the need to conserve normal surrounding tissue, the patient was deemed acceptable for Mohs micrographic surgery (MMS).  The nature and purpose of the procedure, associated benefits and risks including recurrence and scarring, possible complications such as pain, infection, and bleeding, and alternative methods of treatment if appropriate were discussed with the patient during consent. The lesion location was verified by the patient, by reviewing previous notes, pathology reports, and by photographs as well as angulation measurements if available.  Informed consent was reviewed and signed by the patient, and timeout was performed at 9:15 AM. See op note below.  2. For the photodamage and solar lentigines, sun protection discussed/information given on OTC sunscreens, and we recommend continued regular follow-up with primary dermatologist every 6 months or sooner for any growing, bleeding, or changing lesions. 3.  Prognosis and future surveillance discussed. 4. Letter with treatment outcome sent to referring provider. 5. Pain acetaminophen/ibuprofen/tramadol 50 mg   MOHS MICROGRAPHIC SURGERY AND RECONSTRUCTION  Initial size:   2.8 x 2.5 cm Surgical defect/wound size: 3.0 x 2.8 cm Anesthesia:    0.33% lidocaine with 1:200,000 epinephrine EBL:    <5 mL Complications:  None Repair type:   Secondary Intention   Stages: 1   STAGE I: Anesthesia achieved with 0.5% lidocaine with 1:200,000 epinephrine. ChloraPrep applied. 2 section(s) excised using Mohs technique (this includes total peripheral and deep tissue margin excision and evaluation with frozen sections, excised and interpreted by the same physician). The tumor was first debulked and then excised with an approx. 2mm margin.  Hemostasis was achieved with electrocautery as needed.  The specimen was then oriented, subdivided/relaxed, inked, and processed using Mohs technique.    Frozen section analysis revealed a clear deep and peripheral margin.  Reconstruction  Patient was notified of results and repair options were discussed, including second intention healing. After reviewing the advantages and disadvantages of each, we agreed on second intention healing as appropriate.   The surgical site was then lightly scrubbed with sterile, saline-soaked gauze.  The area was bandaged using Vaseline ointment, non-adherent gauze, gauze pads, and tape to provide an adequate pressure dressing.   The patient tolerated the procedure well, was given detailed written and verbal wound care instructions, and was discharged in good condition.  The patient will follow-up in 4 weeks and as scheduled with primary dermatologist.  - mupirocin ointment (BACTROBAN) 2 %; Apply 1 Application  topically daily. To wound under a bandage  Dispense: 22 g; Refill: 2 - traMADol (ULTRAM) 50 MG tablet; Take 1 tablet (50 mg total) by mouth every 6 (six) hours as needed for up to 8 doses.   Dispense: 8 tablet; Refill: 0   Documentation: I have reviewed the above documentation for accuracy and completeness, and I agree with the above.  Gwenith Daily, MD

## 2023-01-26 NOTE — Patient Instructions (Signed)

## 2023-01-28 ENCOUNTER — Encounter: Payer: Self-pay | Admitting: Dermatology

## 2023-02-24 ENCOUNTER — Encounter: Payer: Self-pay | Admitting: Dermatology

## 2023-02-24 ENCOUNTER — Ambulatory Visit: Payer: Medicare Other | Admitting: Dermatology

## 2023-02-24 VITALS — BP 165/83 | HR 56

## 2023-02-24 DIAGNOSIS — Z85828 Personal history of other malignant neoplasm of skin: Secondary | ICD-10-CM

## 2023-02-24 DIAGNOSIS — C4492 Squamous cell carcinoma of skin, unspecified: Secondary | ICD-10-CM

## 2023-02-24 DIAGNOSIS — L539 Erythematous condition, unspecified: Secondary | ICD-10-CM

## 2023-02-24 MED ORDER — MUPIROCIN 2 % EX OINT
1.0000 | TOPICAL_OINTMENT | Freq: Two times a day (BID) | CUTANEOUS | 0 refills | Status: AC
Start: 2023-02-24 — End: ?

## 2023-02-24 NOTE — Progress Notes (Signed)
   New Patient Visit   Subjective  Steve Bryant is a 89 y.o. male who presents for the following: follow up from Mohs surgery.  The patient presents for follow up from Mohs surgery for a SCC on the left lower leg, treated on 01/26/23, repaired with second intention. The patient has been bandaging the wound as directed. The endorse the following concerns: draining a lot and sometimes its green. It is not painful  The following portions of the chart were reviewed this encounter and updated as appropriate: medications, allergies, medical history  Review of Systems:  No other skin or systemic complaints except as noted in HPI or Assessment and Plan.  Objective  Well appearing patient in no apparent distress; mood and affect are within normal limits.  A full examination was performed including scalp, head, face and left lower leg. All findings within normal limits unless otherwise noted below.  Healing wound with mild erythema  Relevant physical exam findings are noted in the Assessment and Plan.    Assessment & Plan   Healing s/p Mohs for [facility], treated on 01/26/23, repaired with second intention - Reassured that wound is healing well - No evidence of infection - No swelling, induration, purulence, dehiscence, or tenderness out of proportion to the clinical exam, see photo above - Discussed that scars take up to 12 months to mature from the date of surgery - Recommend SPF 30+ to scar daily to prevent purple color from UV exposure during scar maturation process - Discussed that erythema and raised appearance of scar will fade over the next 4-6 months - Ok to continue ointment daily to wound under a bandage for another 2-3 weeks or until completely healed. - Mupirocin refill given  Return if symptoms worsen or fail to improve.  Dominga Ferry, Surg Tech III, am acting as scribe for Gwenith Daily, MD.   Documentation: I have reviewed the above documentation for accuracy and  completeness, and I agree with the above.  Gwenith Daily, MD

## 2023-05-18 ENCOUNTER — Encounter: Payer: Self-pay | Admitting: Dermatology

## 2023-05-18 ENCOUNTER — Ambulatory Visit: Payer: Medicare Other | Admitting: Dermatology

## 2023-05-18 ENCOUNTER — Ambulatory Visit (INDEPENDENT_AMBULATORY_CARE_PROVIDER_SITE_OTHER): Payer: Medicare Other | Admitting: Dermatology

## 2023-05-18 DIAGNOSIS — Z1283 Encounter for screening for malignant neoplasm of skin: Secondary | ICD-10-CM

## 2023-05-18 DIAGNOSIS — L570 Actinic keratosis: Secondary | ICD-10-CM

## 2023-05-18 DIAGNOSIS — L821 Other seborrheic keratosis: Secondary | ICD-10-CM

## 2023-05-18 DIAGNOSIS — L814 Other melanin hyperpigmentation: Secondary | ICD-10-CM

## 2023-05-18 DIAGNOSIS — D1801 Hemangioma of skin and subcutaneous tissue: Secondary | ICD-10-CM

## 2023-05-18 DIAGNOSIS — L578 Other skin changes due to chronic exposure to nonionizing radiation: Secondary | ICD-10-CM

## 2023-05-18 DIAGNOSIS — D229 Melanocytic nevi, unspecified: Secondary | ICD-10-CM

## 2023-05-18 DIAGNOSIS — W908XXA Exposure to other nonionizing radiation, initial encounter: Secondary | ICD-10-CM

## 2023-05-18 DIAGNOSIS — Z85828 Personal history of other malignant neoplasm of skin: Secondary | ICD-10-CM

## 2023-05-18 NOTE — Progress Notes (Signed)
 Follow-Up Visit   Subjective  Steve Bryant is a 89 y.o. male who presents for the following: Skin Cancer Screening and Upper Body Skin Exam  The patient presents for Upper Body Skin Exam (UBSE) for skin cancer screening and mole check. The patient has spots, moles and lesions to be evaluated, some may be new or changing and the patient may have concern these could be cancer.  Hx BCC, SCC.   The following portions of the chart were reviewed this encounter and updated as appropriate: medications, allergies, medical history  Review of Systems:  No other skin or systemic complaints except as noted in HPI or Assessment and Plan.  Objective  Well appearing patient in no apparent distress; mood and affect are within normal limits.  All skin waist up, lower legs examined. Relevant physical exam findings are noted in the Assessment and Plan.  arms, dorsal hands Erythematous thin papules/macules with gritty scale.   Assessment & Plan   AK (ACTINIC KERATOSIS) arms, dorsal hands Actinic keratoses are precancerous spots that appear secondary to cumulative UV radiation exposure/sun exposure over time. They are chronic with expected duration over 1 year. A portion of actinic keratoses will progress to squamous cell carcinoma of the skin. It is not possible to reliably predict which spots will progress to skin cancer and so treatment is recommended to prevent development of skin cancer.  Recommend daily broad spectrum sunscreen SPF 30+ to sun-exposed areas, reapply every 2 hours as needed.  Recommend staying in the shade or wearing long sleeves, sun glasses (UVA+UVB protection) and wide brim hats (4-inch brim around the entire circumference of the hat). Call for new or changing lesions.  Observe and treat if necessary MULTIPLE BENIGN NEVI   LENTIGINES   ACTINIC ELASTOSIS   SEBORRHEIC KERATOSES   CHERRY ANGIOMA   Skin cancer screening performed today.  Actinic Damage -  Chronic condition, secondary to cumulative UV/sun exposure - diffuse scaly erythematous macules with underlying dyspigmentation - Recommend daily broad spectrum sunscreen SPF 30+ to sun-exposed areas, reapply every 2 hours as needed.  - Staying in the shade or wearing long sleeves, sun glasses (UVA+UVB protection) and wide brim hats (4-inch brim around the entire circumference of the hat) are also recommended for sun protection.  - Call for new or changing lesions.  Lentigines, Seborrheic Keratoses, Hemangiomas - Benign normal skin lesions - Benign-appearing - Call for any changes  Melanocytic Nevi - Tan-brown and/or pink-flesh-colored symmetric macules and papules - Benign appearing on exam today - Observation - Call clinic for new or changing moles - Recommend daily use of broad spectrum spf 30+ sunscreen to sun-exposed areas.   HISTORY OF BASAL CELL CARCINOMA OF THE SKIN - No evidence of recurrence today - Recommend regular full body skin exams - Recommend daily broad spectrum sunscreen SPF 30+ to sun-exposed areas, reapply every 2 hours as needed.  - Call if any new or changing lesions are noted between office visits  HISTORY OF SQUAMOUS CELL CARCINOMA OF THE SKIN - No evidence of recurrence today - No lymphadenopathy - Recommend regular full body skin exams - Recommend daily broad spectrum sunscreen SPF 30+ to sun-exposed areas, reapply every 2 hours as needed.  - Call if any new or changing lesions are noted between office visits     Return in about 6 months (around 11/18/2023) for UBSE, with Dr. Katrinka Blazing, [facility], HxSCC, [place] follow up.  Anise Salvo, RMA, am acting as scribe for Elie Goody, MD .  Documentation: I have reviewed the above documentation for accuracy and completeness, and I agree with the above.  Elie Goody, MD

## 2023-05-18 NOTE — Patient Instructions (Signed)

## 2023-06-05 ENCOUNTER — Other Ambulatory Visit: Payer: Self-pay | Admitting: Family Medicine

## 2023-06-05 DIAGNOSIS — M4807 Spinal stenosis, lumbosacral region: Secondary | ICD-10-CM

## 2023-06-21 ENCOUNTER — Ambulatory Visit
Admission: RE | Admit: 2023-06-21 | Discharge: 2023-06-21 | Disposition: A | Discharge status: Home / Self Care | Source: Ambulatory Visit | Attending: Family Medicine | Admitting: Family Medicine

## 2023-06-21 DIAGNOSIS — M4807 Spinal stenosis, lumbosacral region: Secondary | ICD-10-CM

## 2023-07-27 ENCOUNTER — Encounter (INDEPENDENT_AMBULATORY_CARE_PROVIDER_SITE_OTHER): Payer: Self-pay

## 2023-09-17 ENCOUNTER — Other Ambulatory Visit (INDEPENDENT_AMBULATORY_CARE_PROVIDER_SITE_OTHER): Payer: Self-pay | Admitting: Vascular Surgery

## 2023-09-17 DIAGNOSIS — I714 Abdominal aortic aneurysm, without rupture, unspecified: Secondary | ICD-10-CM

## 2023-09-21 ENCOUNTER — Ambulatory Visit (INDEPENDENT_AMBULATORY_CARE_PROVIDER_SITE_OTHER): Payer: Medicare Other | Admitting: Vascular Surgery

## 2023-09-21 ENCOUNTER — Other Ambulatory Visit (INDEPENDENT_AMBULATORY_CARE_PROVIDER_SITE_OTHER): Payer: Medicare Other

## 2023-09-21 ENCOUNTER — Encounter (INDEPENDENT_AMBULATORY_CARE_PROVIDER_SITE_OTHER): Payer: Self-pay | Admitting: Vascular Surgery

## 2023-09-21 VITALS — BP 141/83 | HR 60

## 2023-09-21 DIAGNOSIS — I7143 Infrarenal abdominal aortic aneurysm, without rupture: Secondary | ICD-10-CM | POA: Diagnosis not present

## 2023-09-21 DIAGNOSIS — E785 Hyperlipidemia, unspecified: Secondary | ICD-10-CM

## 2023-09-21 DIAGNOSIS — I10 Essential (primary) hypertension: Secondary | ICD-10-CM

## 2023-09-21 DIAGNOSIS — I714 Abdominal aortic aneurysm, without rupture, unspecified: Secondary | ICD-10-CM | POA: Diagnosis not present

## 2023-09-21 NOTE — Progress Notes (Signed)
 MRN : [id number]  Steve Bryant is a 89 y.o. (09/23/1925) male who presents with chief complaint of  Chief Complaint  Patient presents with   f/u in 1 year with evar  .  History of Present Illness: Patient returns today in follow up of his abdominal aortic aneurysm.  About 9 months ago, he underwent coil embolization for a type II endoleak with sac growth.  He had previously underwent endovascular repair of the aneurysm 2 to 3 years ago.  His daughter provides the history today.  He has definitely had some physical decline as well as what sounds like some mental decline since his last visit.  He does not have any clear aneurysm related symptoms. Specifically, the patient denies new back or abdominal pain, or signs of peripheral embolization.  His duplex today shows no growth of the aortic sac measuring about 5.4 to 5.5 cm in maximal diameter and no obvious endoleak is identified on duplex.  Current Outpatient Medications  Medication Sig Dispense Refill   aspirin  EC 81 MG tablet Take 81 mg by mouth daily.     clopidogrel  (PLAVIX ) 75 MG tablet Take 75 mg by mouth daily.     dorzolamide  (TRUSOPT ) 2 % ophthalmic solution Place 1 drop into the right eye daily with lunch.     dorzolamide -timolol  (COSOPT ) 22.3-6.8 MG/ML ophthalmic solution Place 1 drop into both eyes 2 (two) times daily.  2   gabapentin  (NEURONTIN ) 100 MG capsule Take 100 mg by mouth 2 (two) times daily with a meal.     latanoprost  (XALATAN ) 0.005 % ophthalmic solution Place 1 drop into the right eye at bedtime.     levothyroxine  (SYNTHROID ) 50 MCG tablet Take 50 mcg by mouth daily.     losartan  (COZAAR ) 25 MG tablet Take 1 tablet (25 mg total) by mouth daily. 30 tablet 2   Multiple Vitamin (MULTIVITAMIN) tablet Take 1 tablet by mouth daily.     mupirocin  ointment (BACTROBAN ) 2 % Apply 1 Application topically daily. To wound under a bandage 22 g 2   mupirocin  ointment (BACTROBAN ) 2 % Apply 1 Application topically 2 (two)  times daily. To wound under a bandage 22 g 0   MYRBETRIQ  25 MG TB24 tablet Take 25 mg by mouth daily.     pantoprazole  (PROTONIX ) 20 MG tablet Take 20 mg by mouth daily.     pregabalin  (LYRICA ) 75 MG capsule Take 75 mg by mouth 3 (three) times daily.     QUEtiapine  (SEROQUEL ) 25 MG tablet Take 25 mg by mouth 2 (two) times daily.     traMADol  (ULTRAM ) 50 MG tablet Take 1 tablet (50 mg total) by mouth every 6 (six) hours as needed for up to 8 doses. 8 tablet 0   No current facility-administered medications for this visit.    Past Medical History:  Diagnosis Date   AAA (abdominal aortic aneurysm) (HCC)    a.) Abd US  12/22/16: measured 4.5 cm. b.) CTA 12/27/18: fusiform infrarenal AAA measured 4.7 cm. c.) Abd US  07/04/19: measured 5.0 cm. d.) CTA 11/03/19: measured 4.9 cm. e.) CTA 10/21/20: interval increase in size to 5.4 x 5.0 cm extending to the iliac bifurcation.   Acute ischemic stroke (HCC) 10/16/2016   a.) MRI --> 13 x 14 mm ischemic, non-hemorrhagic, RIGHT paramedian ventral pons territory   Aortic atherosclerosis (HCC)    Arthritis    Basal cell carcinoma 06/25/2022   R preauricular, EDC done   BCC (basal cell carcinoma) 04/16/2022  right cheek, tx with [facility] 06/25/22   BCC (basal cell carcinoma) 04/16/2022   left prox pretibia, tx'd with EDC   Bilateral renal cysts    Bradycardia    Carotid artery disease (HCC)    a.) Doppler 07/02/09: RIGHT 50-75%, LEFT < 50%. b.) CTA 07/04/09: RIGHT 90%. LEFT 50-75%. c.) CTA 10/15/2016: RIGHT with no significant stenosis, LEFT 60%.   CHF (congestive heart failure) (HCC)    Chronic anticoagulation    a.) DAPT therapy (ASA + clopidogrel )   Coronary artery disease    DDD (degenerative disc disease), lumbar    Diastolic dysfunction 04/16/2020   a.) TTE 04/16/20: EF 60-65%; G1DD.   DOE (dyspnea on exertion)    Dyspnea    GERD (gastroesophageal reflux disease)    Glaucoma    Hyperlipidemia    Hypertension    Legally blind    Neck mass     a.) CT 07/04/09: 4.9 cm supraclavicular mass just posterior to clavicle; PET CT 07/05/09 showed mass to be FDG avid with max SUV of 6. b.) CT 10/15/16: measured 4 x 5 cm. c.) CT 04/15/20: measured 5.1 x 3.7 cm.   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 WD SCC. EDC   Squamous cell carcinoma of skin 06/15/2019   Right calf. KA-type. EDC   Squamous cell carcinoma of skin 11/13/2020   Right distal hand (in situ) - EDC   Squamous cell carcinoma of skin 11/13/2020   Right prox hand (in situ) - EDC   TIA (transient ischemic attack) 04/15/2020   Valvular regurgitation    a.) TTE 10/17/2016 : EF 55-60%; mild AR/MR. b.) TTE 04/16/2020: EF 60-65%; G1DD, mild AR, triv. MR. c.) TTE 12/09/20: EF >55%; mild AR/TR, triv. PR, mod. MR.    Past Surgical History:  Procedure Laterality Date   CERVICAL LAMINECTOMY N/A 05/2006   Fusion C3-C7   EMBOLIZATION (CATH LAB) N/A 10/29/2022   Procedure: EMBOLIZATION;  Surgeon: Marea Selinda RAMAN, MD;  Location: [facility] INVASIVE CV LAB;  Service: Cardiovascular;  Laterality: N/A;   ENDOVASCULAR REPAIR/STENT GRAFT N/A 12/25/2020   Procedure: ENDOVASCULAR REPAIR/STENT GRAFT;  Surgeon: Marea Selinda RAMAN, MD;  Location: [facility] INVASIVE CV LAB;  Service: Cardiovascular;  Laterality: N/A;   GLAUCOMA SURGERY  2003   INGUINAL HERNIA REPAIR Right 2007   TUMOR REMOVAL Right 1977   Thoracotomy with RUL pulmonary mass resection     Social History   Tobacco Use   Smoking status: Former    Current packs/day: 0.00    Average packs/day: 1 pack/day for 25.0 years (25.0 ttl pk-yrs)    Types: Cigarettes    Start date: 71    Quit date: 73    Years since quitting: 46.5   Smokeless tobacco:  Never  Vaping Use   Vaping status: Never Used  Substance Use Topics   Alcohol use: Not Currently    Comment: occasionally   Drug use: No      Family History  Problem Relation Age of Onset   Stroke Mother    Stroke Father    Cancer Brother      Allergies  Allergen Reactions   Shellfish-Derived Products Nausea Only  Scallops     REVIEW OF SYSTEMS (Negative unless checked)   Constitutional: [] Weight loss  [] Fever  [] Chills Cardiac: [] Chest pain   [] Chest pressure   [x] Palpitations   [] Shortness of breath when laying flat   [] Shortness of breath at rest   [x] Shortness of breath with exertion. Vascular:  [] Pain in legs with walking   [] Pain in legs at rest   [] Pain in legs when laying flat   [] Claudication   [] Pain in feet when walking  [] Pain in feet at rest  [] Pain in feet when laying flat   [] History of DVT   [] Phlebitis   [] Swelling in legs   [] Varicose veins   [] Non-healing ulcers Pulmonary:   [] Uses home oxygen   [] Productive cough   [] Hemoptysis   [] Wheeze  [] COPD   [] Asthma Neurologic:  [] Dizziness  [] Blackouts   [] Seizures   [x] History of stroke   [] History of TIA  [] Aphasia   [] Temporary blindness   [] Dysphagia   [] Weakness or numbness in arms   [] Weakness or numbness in legs Musculoskeletal:  [x] Arthritis   [] Joint swelling   [] Joint pain   [x] Low back pain Hematologic:  [] Easy bruising  [] Easy bleeding   [] Hypercoagulable state   [] Anemic   Gastrointestinal:  [] Blood in stool   [] Vomiting blood  [] Gastroesophageal reflux/heartburn   [] Abdominal pain Genitourinary:  [] Chronic kidney disease   [] Difficult urination  [] Frequent urination  [] Burning with urination   [] Hematuria Skin:  [] Rashes   [] Ulcers   [] Wounds Psychological:  [] History of anxiety   []  History of major depression.  Physical Examination  BP (!) 141/83   Pulse 60  Gen:   NAD. Appears frail and elderly Head: [place]/AT, + temporalis wasting. Ear/Nose/Throat: Hearing grossly intact, nares w/o erythema or  drainage Eyes: Conjunctiva clear.visual acuity poor Neck: Supple.  Trachea midline Pulmonary:  Good air movement, no use of accessory muscles.  Cardiac: irregular Vascular:  Vessel Right Left  Radial Palpable Palpable                                   Gastrointestinal: soft, non-tender/non-distended. No guarding/reflex.  Musculoskeletal: M/S 5/5 throughout.  No deformity or atrophy. No edema. Neurologic: Sensation grossly intact in extremities.  Symmetrical.  Speech is fluent.  Psychiatric: Judgment intact, Mood & affect appropriate for pt's clinical situation. Dermatologic: No rashes or ulcers noted.  No cellulitis or open wounds.      Labs No results found for this or any previous visit (from the past 2160 hours).  Radiology No results found.  Assessment/Plan  AAA (abdominal aortic aneurysm) without rupture His duplex today shows no growth of the aortic sac measuring about 5.4 to 5.5 cm in maximal diameter and no obvious endoleak is identified on duplex. No role for intervention at this time.  Plan follow-up duplex in 6 months.  Given his significant decline in advancing age, this would need to have some significant growth before he would consider any further evaluation or intervention beyond duplex.  Essential hypertension, benign blood pressure control important in reducing the progression of atherosclerotic disease and aneurysmal growth. On appropriate oral medications.     Hyperlipidemia lipid control important in reducing the progression of atherosclerotic disease. Continue statin therapy  Selinda Gu, MD  09/21/2023 12:03 PM    This note was created with Dragon medical transcription system.  Any errors from dictation are purely unintentional

## 2023-09-21 NOTE — Assessment & Plan Note (Signed)
 His duplex today shows no growth of the aortic sac measuring about 5.4 to 5.5 cm in maximal diameter and no obvious endoleak is identified on duplex. No role for intervention at this time.  Plan follow-up duplex in 6 months.  Given his significant decline in advancing age, this would need to have some significant growth before he would consider any further evaluation or intervention beyond duplex.

## 2023-11-18 ENCOUNTER — Other Ambulatory Visit: Payer: Self-pay | Admitting: Nurse Practitioner

## 2023-11-18 DIAGNOSIS — R1031 Right lower quadrant pain: Secondary | ICD-10-CM

## 2023-11-18 DIAGNOSIS — I739 Peripheral vascular disease, unspecified: Secondary | ICD-10-CM

## 2023-11-22 ENCOUNTER — Ambulatory Visit
Admission: RE | Admit: 2023-11-22 | Discharge: 2023-11-22 | Disposition: A | Discharge status: Home / Self Care | Source: Ambulatory Visit | Attending: Nurse Practitioner | Admitting: Nurse Practitioner

## 2023-11-22 DIAGNOSIS — I739 Peripheral vascular disease, unspecified: Secondary | ICD-10-CM | POA: Insufficient documentation

## 2023-11-22 DIAGNOSIS — R1031 Right lower quadrant pain: Secondary | ICD-10-CM | POA: Diagnosis present

## 2023-11-23 ENCOUNTER — Ambulatory Visit: Admitting: Dermatology

## 2023-12-07 ENCOUNTER — Other Ambulatory Visit (INDEPENDENT_AMBULATORY_CARE_PROVIDER_SITE_OTHER): Payer: Self-pay | Admitting: Vascular Surgery

## 2023-12-07 ENCOUNTER — Ambulatory Visit (INDEPENDENT_AMBULATORY_CARE_PROVIDER_SITE_OTHER)

## 2023-12-07 ENCOUNTER — Ambulatory Visit (INDEPENDENT_AMBULATORY_CARE_PROVIDER_SITE_OTHER): Admitting: Vascular Surgery

## 2023-12-07 ENCOUNTER — Encounter (INDEPENDENT_AMBULATORY_CARE_PROVIDER_SITE_OTHER)

## 2023-12-07 VITALS — BP 151/87 | HR 76

## 2023-12-07 DIAGNOSIS — I739 Peripheral vascular disease, unspecified: Secondary | ICD-10-CM

## 2023-12-07 DIAGNOSIS — R1031 Right lower quadrant pain: Secondary | ICD-10-CM | POA: Diagnosis not present

## 2023-12-07 DIAGNOSIS — I10 Essential (primary) hypertension: Secondary | ICD-10-CM

## 2023-12-07 DIAGNOSIS — E785 Hyperlipidemia, unspecified: Secondary | ICD-10-CM

## 2023-12-07 DIAGNOSIS — I7143 Infrarenal abdominal aortic aneurysm, without rupture: Secondary | ICD-10-CM

## 2023-12-07 NOTE — Assessment & Plan Note (Signed)
 He had an outpatient limited duplex study which suggested some diffuse atherosclerosis throughout the lower extremity on the right.  We did ABIs today to further elucidate his perfusion status.  Although his ABIs were likely falsely elevated due to medial calcification, they were greater than 1.3 bilaterally.  His right waveforms are strong and his digit pressure and waveform was normal.  He had triphasic flow in the right lower extremity and biphasic flow in the left lower extremity.  Left digital pressure was only mildly reduced at 93.  I do not think that PAD is the cause of his lower extremity symptoms.  I do not think any vascular intervention will be of benefit.  He has done some physical therapy in the past with no improvement.  I will defer further workup to his primary care physician.

## 2023-12-07 NOTE — Progress Notes (Signed)
 Today the history is gathered from: 50% - patient  50% - daughter   REFERRING PHYSICIAN: Glover Alm Hail* PRIMARY CARE PHYSICIAN:  Glover Alm Hail, MD  IMPRESSION/PLAN  Steve Bryant is a 89 y.o. male presenting for evaluation of  HALLUCINATIONS / NEUROPATHY Assessment & Plan Right leg weakness and pain Acute onset of right leg weakness and pain for three weeks, with significant weakness and pain upon lifting. Sudden onset concerning for possible stroke, but patient is currently on DAPT, further workup would not alter management. Physical therapy is deferred due to pain severity.  Has seen vascular today. -Patient and daughter deferred further workup at this time.  Will continue to monitor. - Increase gabapentin  to 200 mg in the morning and 100 mg at night, with further adjustments based on tolerance and effectiveness.  Can consider increasing to 200 mg twice daily. - Monitor pain levels and adjust gabapentin  dosage as needed. - Continue current regimen of aspirin  and Plavix   Peripheral neuropathy Chronic peripheral neuropathy with ongoing pain management challenges. Current medications include gabapentin  and pregabalin . Previous trial of topical compounded nerve cream was ineffective. - Increase gabapentin  as outlined above to address neuropathic pain. - Monitor response to increased gabapentin  dosage and adjust as needed. - Continue pregabalin  at current dose of 75 mg 3 times daily from other provider. - Coordinate with pain management for hydrocodone prescriptions as needed.  Hallucinations Intermittent hallucinations, primarily when sick. Managed with quetiapine , which effectively controls symptoms. No recent exacerbations reported. - Continue quetiapine  25 mg after dinner and 25 mg before bed. - Monitor for any changes in hallucination frequency or severity.  Blood pressure fluctuations Blood pressure fluctuations with recent readings as high as 200/100 mmHg at  recent doctors visit. Monitoring is ongoing.  Following closely with cardiology. - Monitor blood pressure fluctuations and report any significant changes. - BP elevated in visit today. Encouraged patient to monitor BP closely at home and follow up with PCP regarding elevated BP. If BP is higher than 180 systolic (top number) or any symptoms of hypertensive urgency/emergency (headaches, vision changes, chest pain, shortness of breath, abdominal pain, dizziness, etc), please call 911.  Patient verbalized understanding.   Follow-up in 6 to 12 months  Recording duration: 15 minutes   CHIEF COMPLAINT & HPI  Steve Bryant is a 89 y.o. male presenting for evaluation of: Chief Complaint  Patient presents with  . HALLUCINATIONS/NEUROPATHY    HALLUCINATIONS/NEUROPATHY History of Present Illness Steve Bryant is a 89 year old male presenting for 1 year follow up.  He has been experiencing significant weakness in his right leg for approximately three weeks. The onset was sudden, described as waking up one day unable to move or get out of bed. He cannot lift the leg without assistance and experiences pain when attempting to do so. He initially had weakness in both legs, but it is now more pronounced in the right leg.  He experienced a transient episode of weakness in his right arm today, which has since improved. There were no associated symptoms such as facial droop or slurred speech. He is currently taking aspirin  and Plavix .  For pain management, he is taking gabapentin  100 mg twice daily and Lyrica . He has used a compounded topical cream in the past, which was ineffective. He also takes hydrocodone sparingly for pain, as prescribed by his pain management specialist. Tylenol  is no longer effective for his pain.  His blood pressure has been fluctuating, with recent readings as high as 200/100 during  a cardiology visit. He reports feeling hungry but otherwise does not mention any new  symptoms.    DATA SUMMARY: 06/21/2023 MR LUMBAR SPINE WO CONTRAST IMPRESSION:  Degenerative changes of the lumbar spine as above.   Disc bulge at L2-3 results in left lateral recess narrowing with  possible impingement upon the traversing left L3 nerve roots.   Disc bulge at L4-5 results in lateral recess narrowing bilaterally  with impingement upon the traversing right L5 nerve roots. Disc  bulge also contacts the extraforaminal portion of the right L4 nerve  root.   Disc bulge at L5-S1 results in lateral recess narrowing with  possible impingement upon the traversing left S1 nerve roots.   Multilevel foraminal stenosis, greatest and severe on the right at  L4-5. Additional moderate to severe foraminal stenosis on the left  at L4-5 and on the right at L3-4.   Facet osteophytes at T12-L1 abut the exiting left T12 nerve root.   Redemonstrated abdominal aortic aneurysm and aortic bi-iliac  endograft stent.   12/24/21 CT Head without contrast Impression: IMPRESSION:  No acute intracranial abnormality.  Generalized cerebral atrophy and chronic microvascular ischemic  change.  Chronic paranasal sinusitis.   07/06/21 CT HEAD WO CONTRAST IMPRESSION:  1. No acute intracranial abnormality.  2. Atrophy with chronic small vessel white matter ischemic disease.  3. Chronic paranasal sinusitis.   01/28/2019 CT HEAD WO CONTRAST IMPRESSION: 1. No CT evidence for acute intracranial abnormality. 2. Atrophy and small vessel ischemic changes of the white matter. Multifocal chronic infarcts involving the cerebellum, right parietal lobe, left thalamus and pons.  VISIT SUMMARIES:   MEDICATIONS Current Outpatient Medications  Medication Sig Dispense Refill  . aspirin  81 MG EC tablet Take 81 mg by mouth once daily.    . clopidogreL  (PLAVIX ) 75 mg tablet TAKE 1 TABLET BY MOUTH EVERY DAY 90 tablet 3  . clotrimazole (LOTRIMIN) 1 % cream APPLY TOPICALLY TWICE A DAY 30 g 0  .  dorzolamide  (TRUSOPT ) 2 % ophthalmic solution INSTILL ONE DROP TO BOTH EYES TWICE DAILY  5  . dorzolamide -timoloL  (COSOPT ) 22.3-6.8 mg/mL ophthalmic solution INSTILL 1 DROP INTO BOTH EYES TWICE A DAY    . FUROsemide (LASIX) 20 MG tablet Take 1 tablet (20 mg total) by mouth once daily 30 tablet 1  . gabapentin  (NEURONTIN ) 100 MG capsule Take 2 capsules (200 mg total) by mouth 3 (three) times daily 540 capsule 0  . HYDROcodone-acetaminophen  (NORCO) 5-325 mg tablet Take 1 tablet by mouth 2 (two) times daily as needed 14 tablet 0  . latanoprost  (XALATAN ) 0.005 % ophthalmic solution Place 1 drop into the right eye nightly     3  . levothyroxine  (SYNTHROID ) 50 MCG tablet TAKE 1 TABLET ONCE DAILY ON EMPTY STOMACH WITH A GLASS OF WATER AT LEAST 30-60 MINS BEFORE BREAKFAST 90 tablet 3  . losartan  (COZAAR ) 25 MG tablet Take 2 tablets (50 mg total) by mouth once daily (Patient taking differently: Take 25 mg by mouth once daily) 180 tablet 3  . multivitamin tablet Take 1 tablet by mouth once daily    . MYRBETRIQ  25 mg ER Tablet TAKE 1 TABLET BY MOUTH EVERY DAY 90 tablet 3  . pantoprazole  (PROTONIX ) 20 MG DR tablet TAKE 1 TABLET BY MOUTH EVERY DAY 90 tablet 1  . pregabalin  (LYRICA ) 75 MG capsule Take 1 capsule (75 mg total) by mouth 3 (three) times daily 270 capsule 0  . QUEtiapine  (SEROQUEL ) 25 MG tablet Take one table  pm and  1 tablet 1 hour before bed. 180 tablet 3   No current facility-administered medications for this visit.    ALLERGIES Allergies  Allergen Reactions  . Shellfish Containing Products Nausea    Scallops     EXAM   Vitals:   12/07/23 1240  BP: (!) 170/88  Height: 175.3 cm (5' 9)  PainSc: 0-No pain  PainLoc: Leg   Body mass index is 22.74 kg/m.  GENERAL: Pleasant male, NAD. Normocephalic and atraumatic.  Baseline neurological exam below was obtained at prior office visit. Changes from today's visit appear in bold.    MUSCULOSKELETAL: Bulk - Normal Tone -  Normal Pronator Drift - Absent bilaterally. Ambulation - Gait and station are deferred today, patient in a wheel chair Romberg - deferred today  NEUROLOGICAL: MENTAL STATUS: Patient is oriented to person, place and time.   Short-term memory is very mild, age related changes Long-term memory is intact.   Attention span and concentration are intact.   Naming and repetition are intact. Comprehension is intact.   Expressive speech is intact.   Patient's fund of knowledge is within normal limits for educational level.  CRANIAL NERVES: Visual acuity and visual fields are intact         Extraocular muscles are intact                        Facial sensation is intact bilaterally                Facial strength is intact bilaterally                   Hearing is intact bilaterally                              Palate elevates midline, normal phonation     Shoulder shrug strength is intact                     COORDINATION/CEREBELLAR: Finger to nose testing is deferred      PAST MEDICAL HISTORY Past Medical History:  Diagnosis Date  . Abdominal aneurysm ()   . Arthritis   . Asthma without status asthmaticus (HHS-HCC)   . CVA (cerebral infarction) (CMS/HHS-HCC)   . Glaucoma (increased eye pressure)   . Hyperlipidemia   . Hypertension   . Lung mass   . MR (mitral regurgitation)   . Peripheral neuropathy   . Renal insufficiency   . Tricuspid regurgitation     PAST SURGICAL HISTORY Past Surgical History:  Procedure Laterality Date  . Lung Tumor Removal   1977  . GLAUCOMA EYE SURGERY  2003  . INGUINAL HERNIA REPAIR Right 2007  . LAMINOTOMY CERVICAL SITTING  March 2008    FAMILY HISTORY Family History  Problem Relation Name Age of Onset  . Stroke Mother    . Stroke Father      SOCIAL HISTORY  Social History   Tobacco Use  . Smoking status: Former    Current packs/day: 0.00    Average packs/day: 1 pack/day for 25.0 years (25.0 ttl pk-yrs)    Types: Cigarettes    Start  date: 03/09/1952    Quit date: 03/09/1977    Years since quitting: 46.7    Passive exposure: Never  . Smokeless tobacco: Never  Vaping Use  . Vaping status: Never Used  Substance Use Topics  . Alcohol use: No    Alcohol/week:  0.0 standard drinks of alcohol  . Drug use: No     REVIEW OF SYSTEMS:  13 system ROS was verbally reviewed with patient. Pertinent positives and negatives are mentioned above in the HPI and all other systems are negative.   DATA     01/28/2019 CT HEAD WO CONTRAST CLINICAL DATA:  Hallucinations  EXAM: CT HEAD WITHOUT CONTRAST  TECHNIQUE: Contiguous axial images were obtained from the base of the skull through the vertex without intravenous contrast.  COMPARISON:  CT brain 10/16/2016  FINDINGS: Brain: No acute territorial infarction, hemorrhage or intracranial mass. Atrophy and moderate chronic small vessel ischemic change of the white matter. Chronic multifocal infarcts involving the right parietal cortex and bilateral cerebellar hemispheres. Chronic lacunar infarct in the pons. Chronic lacunar infarcts in the left thalamus. Stable ventricle size.  Vascular: No hyperdense vessel. Vertebral and carotid vascular calcification.  Skull: No fracture. Bony thickening of the frontal sinus with suspected postsurgical change.  Sinuses/Orbits: Opacified sphenoid and ethmoid sinuses. Postsurgical changes of the ethmoid air cells. Bony thickening of the sinuses consistent with chronic disease.  Other: None  IMPRESSION: 1. No CT evidence for acute intracranial abnormality. 2. Atrophy and small vessel ischemic changes of the white matter. Multifocal chronic infarcts involving the cerebellum, right parietal lobe, left thalamus and pons.   Electronically Signed   By: Luke Bun M.D.   On: 01/28/2019 22:29  Ancillary Procedure on 11/11/2023  Component Date Value Ref Range Status  . LV Ejection Fraction (%) 11/11/2023 45  % Final  . Left Atrium  Diameter (cm) 11/11/2023 4.3  cm Final  . LV End Diastolic Diameter (cm) 11/11/2023 5.1  cm Final  . LV End Systolic Diameter (cm) 11/11/2023 4.4  cm Final  . LV Septum Wall Thickness (cm) 11/11/2023 1.3  cm Final  . LV Posterior Wall Thickness (cm) 11/11/2023 1.0  cm Final  . Tricuspid Valve Regurgitation Grade 11/11/2023 trivial   Final  . Mitral Valve Regurgitation Grade 11/11/2023 moderate   Final  . Mitral Valve Stenosis Grade 11/11/2023 none   Final  . Mitral Valve Stenosis Mean Gradien* 11/11/2023 2  mmHg Final  . Aortic Valve Regurgitation Grade 11/11/2023 moderate   Final  . Aortic Valve Stenosis Grade 11/11/2023 none   Final  . Aortic Valve Stenosis Mean Gradien* 11/11/2023 6  mmHg Final  . Aortic Valve Max Velocity (m/s) 11/11/2023 1.7  m/s Final  Appointment on 11/11/2023  Component Date Value Ref Range Status  . Glucose 11/11/2023 118 (H)  70 - 110 mg/dL Final  . Sodium 90/95/7974 145  136 - 145 mmol/L Final  . Potassium 11/11/2023 4.1  3.6 - 5.1 mmol/L Final  . Chloride 11/11/2023 108  97 - 109 mmol/L Final  . Carbon Dioxide (CO2) 11/11/2023 27.2  22.0 - 32.0 mmol/L Final  . Calcium  11/11/2023 9.6  8.7 - 10.3 mg/dL Final  . Urea  Nitrogen (BUN) 11/11/2023 19  7 - 25 mg/dL Final  . Creatinine 90/95/7974 1.0  0.7 - 1.3 mg/dL Final  . Glomerular Filtration Rate (eGFR) 11/11/2023 69  >60 mL/min/1.73sq m Final  . BUN/Crea Ratio 11/11/2023 19.0  6.0 - 20.0 Final  . Anion Gap w/K 11/11/2023 13.9  6.0 - 16.0 Final  Office Visit on 10/28/2023  Component Date Value Ref Range Status  . Vent Rate (bpm) 10/28/2023 64   Final  . PR Interval (msec) 10/28/2023 226   Final  . QRS Interval (msec) 10/28/2023 114   Final  . QT Interval (  msec) 10/28/2023 404   Final  . QTc (msec) 10/28/2023 416   Final  . Urine Culture, Routine - [facility] 10/28/2023 Final report   Final  . Result 1 - [facility] 10/28/2023 Comment   Final  . Color 10/28/2023 Yellow  Colorless, Straw, Light Yellow, Yellow, Dark  Yellow Final  . Clarity 10/28/2023 Clear  Clear Final  . Specific Gravity 10/28/2023 1.015  1.005 - 1.030 Final  . pH, Urine 10/28/2023 6.5  5.0 - 8.0 Final  . Protein, Urinalysis 10/28/2023 Negative  Negative mg/dL Final  . Glucose, Urinalysis 10/28/2023 Negative  Negative mg/dL Final  . Ketones, Urinalysis 10/28/2023 Negative  Negative mg/dL Final  . Blood, Urinalysis 10/28/2023 Negative  Negative Final  . Nitrite, Urinalysis 10/28/2023 Negative  Negative Final  . Leukocyte Esterase, Urinalysis 10/28/2023 Negative  Negative Final  . Bilirubin, Urinalysis 10/28/2023 Negative  Negative Final  . Urobilinogen, Urinalysis 10/28/2023 0.2  0.2 - 1.0 mg/dL Final  . WBC, UA 91/78/7974 <1  <=5 /hpf Final  . Red Blood Cells, Urinalysis 10/28/2023 1  <=3 /hpf Final  . Bacteria, Urinalysis 10/28/2023 0-5  0 - 5 /hpf Final  . Squamous Epithelial Cells, Urinaly* 10/28/2023 0  /hpf Final  . Glucose 10/28/2023 113 (H)  70 - 110 mg/dL Final  . Sodium 91/78/7974 142  136 - 145 mmol/L Final  . Potassium 10/28/2023 4.4  3.6 - 5.1 mmol/L Final  . Chloride 10/28/2023 107  97 - 109 mmol/L Final  . Carbon Dioxide (CO2) 10/28/2023 28.2  22.0 - 32.0 mmol/L Final  . Calcium  10/28/2023 9.9  8.7 - 10.3 mg/dL Final  . Urea  Nitrogen (BUN) 10/28/2023 14  7 - 25 mg/dL Final  . Creatinine 91/78/7974 1.1  0.7 - 1.3 mg/dL Final  . Glomerular Filtration Rate (eGFR) 10/28/2023 62  >60 mL/min/1.73sq m Final  . BUN/Crea Ratio 10/28/2023 12.7  6.0 - 20.0 Final  . Anion Gap w/K 10/28/2023 11.2  6.0 - 16.0 Final  . BNP (Brain Natriuretic Peptide) 10/28/2023 414.0 (H)  <=100.0 pg/mL Final   No follow-ups on file.  Payor: MEDICARE / Plan: MEDICARE A AND B / Product Type: Medicare /   This note is partially written by Roe Mater, in the presence of and acting as the scribe of Lauraine Rocks, PA-C.    I agree that the scribe documentation is complete and accurate.  This note was generated in part with voice recognition  software and I apologize for any typographical errors that were not detected and corrected.    Attestation Statement:   I personally performed the service, non-incident to. [facility])   SARAH ELIZABETH MASON, PA       This note has been created using automated tools and reviewed for accuracy by [facility]

## 2023-12-07 NOTE — Progress Notes (Signed)
 MRN : [id number]  Steve Bryant is a 89 y.o. (1925-12-13) male who presents with chief complaint of No chief complaint on file. SABRA  History of Present Illness: Patient returns today in follow up prior to his scheduled visit due to pain and weakness in his lower extremities particularly on the right side.  He had an arterial duplex done at the hospital which appeared somewhat limited but described diffuse atherosclerotic changes throughout the right lower extremity.  He continues to be very rigid and have difficulty with mobility in his legs.  He essentially has to pick up his legs to move them.  He has been on neuropathic medications for neuropathic pain for many years but this persists.  We have previously treated for an aneurysm repair.  He has no current aneurysm related symptoms.  Current Outpatient Medications  Medication Sig Dispense Refill   aspirin  EC 81 MG tablet Take 81 mg by mouth daily.     clopidogrel  (PLAVIX ) 75 MG tablet Take 75 mg by mouth daily.     dorzolamide  (TRUSOPT ) 2 % ophthalmic solution Place 1 drop into the right eye daily with lunch.     dorzolamide -timolol  (COSOPT ) 22.3-6.8 MG/ML ophthalmic solution Place 1 drop into both eyes 2 (two) times daily.  2   furosemide (LASIX) 20 MG tablet Take 20 mg by mouth daily.     gabapentin  (NEURONTIN ) 100 MG capsule Take 100 mg by mouth 2 (two) times daily with a meal.     latanoprost  (XALATAN ) 0.005 % ophthalmic solution Place 1 drop into the right eye at bedtime.     levothyroxine  (SYNTHROID ) 50 MCG tablet Take 50 mcg by mouth daily.     losartan  (COZAAR ) 25 MG tablet Take 1 tablet (25 mg total) by mouth daily. 30 tablet 2   Multiple Vitamin (MULTIVITAMIN) tablet Take 1 tablet by mouth daily.     mupirocin  ointment (BACTROBAN ) 2 % Apply 1 Application topically 2 (two) times daily. To wound under a bandage 22 g 0   MYRBETRIQ  25 MG TB24 tablet Take 25 mg by mouth daily.     pantoprazole  (PROTONIX ) 20 MG tablet Take 20 mg by  mouth daily.     pregabalin  (LYRICA ) 75 MG capsule Take 75 mg by mouth 3 (three) times daily.     QUEtiapine  (SEROQUEL ) 25 MG tablet Take 25 mg by mouth 2 (two) times daily.     traMADol  (ULTRAM ) 50 MG tablet Take 1 tablet (50 mg total) by mouth every 6 (six) hours as needed for up to 8 doses. 8 tablet 0   No current facility-administered medications for this visit.    Past Medical History:  Diagnosis Date   AAA (abdominal aortic aneurysm)    a.) Abd US  12/22/16: measured 4.5 cm. b.) CTA 12/27/18: fusiform infrarenal AAA measured 4.7 cm. c.) Abd US  07/04/19: measured 5.0 cm. d.) CTA 11/03/19: measured 4.9 cm. e.) CTA 10/21/20: interval increase in size to 5.4 x 5.0 cm extending to the iliac bifurcation.   Acute ischemic stroke (HCC) 10/16/2016   a.) MRI --> 13 x 14 mm ischemic, non-hemorrhagic, RIGHT paramedian ventral pons territory   Aortic atherosclerosis    Arthritis    Basal cell carcinoma 06/25/2022   R preauricular, EDC done   BCC (basal cell carcinoma) 04/16/2022   right cheek, tx with [facility] 06/25/22   BCC (basal cell carcinoma) 04/16/2022   left prox pretibia, tx'd with EDC   Bilateral renal cysts    Bradycardia  Carotid artery disease    a.) Doppler 07/02/09: RIGHT 50-75%, LEFT < 50%. b.) CTA 07/04/09: RIGHT 90%. LEFT 50-75%. c.) CTA 10/15/2016: RIGHT with no significant stenosis, LEFT 60%.   CHF (congestive heart failure) (HCC)    Chronic anticoagulation    a.) DAPT therapy (ASA + clopidogrel )   Coronary artery disease    DDD (degenerative disc disease), lumbar    Diastolic dysfunction 04/16/2020   a.) TTE 04/16/20: EF 60-65%; G1DD.   DOE (dyspnea on exertion)    Dyspnea    GERD (gastroesophageal reflux disease)    Glaucoma    Hyperlipidemia    Hypertension    Legally blind    Neck mass    a.) CT 07/04/09: 4.9 cm supraclavicular mass just posterior to clavicle; PET CT 07/05/09 showed mass to be FDG avid with max SUV of 6. b.) CT 10/15/16: measured 4 x 5 cm. c.) CT  04/15/20: measured 5.1 x 3.7 cm.   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 WD SCC. EDC   Squamous cell carcinoma of skin 06/15/2019   Right calf. KA-type. EDC   Squamous cell carcinoma of skin 11/13/2020   Right distal hand (in situ) - EDC   Squamous cell carcinoma of skin 11/13/2020   Right prox hand (in situ) - EDC   TIA (transient ischemic attack) 04/15/2020   Valvular regurgitation    a.) TTE 10/17/2016 : EF 55-60%; mild AR/MR. b.) TTE 04/16/2020: EF 60-65%; G1DD, mild AR, triv. MR. c.) TTE 12/09/20: EF >55%; mild AR/TR, triv. PR, mod. MR.    Past Surgical History:  Procedure Laterality Date   CERVICAL LAMINECTOMY N/A 05/2006   Fusion C3-C7   EMBOLIZATION (CATH LAB) N/A 10/29/2022   Procedure: EMBOLIZATION;  Surgeon: Marea Selinda RAMAN, MD;  Location: [facility] INVASIVE CV LAB;  Service: Cardiovascular;  Laterality: N/A;   ENDOVASCULAR STENT GRAFT (AAA) N/A 12/25/2020   Procedure: ENDOVASCULAR REPAIR/STENT GRAFT;  Surgeon: Marea Selinda RAMAN, MD;  Location: [facility] INVASIVE CV LAB;  Service: Cardiovascular;  Laterality: N/A;   GLAUCOMA SURGERY  2003   INGUINAL HERNIA REPAIR Right 2007   TUMOR REMOVAL Right 1977   Thoracotomy with RUL pulmonary mass resection     Social History   Tobacco Use   Smoking status: Former    Current packs/day: 0.00    Average packs/day: 1 pack/day for 25.0 years (25.0 ttl pk-yrs)    Types: Cigarettes    Start date: 58    Quit date: 21    Years since quitting: 46.7   Smokeless tobacco: Never  Vaping Use   Vaping status: Never Used  Substance Use Topics   Alcohol use: Not Currently    Comment: occasionally   Drug use: No      Family History  Problem  Relation Age of Onset   Stroke Mother    Stroke Father    Cancer Brother      Allergies  Allergen Reactions   Shellfish-Derived Products Nausea Only    Scallops      REVIEW OF SYSTEMS (Negative unless checked)   Constitutional: [] Weight loss  [] Fever  [] Chills Cardiac: [] Chest pain   [] Chest pressure   [x] Palpitations   []   Shortness of breath when laying flat   [] Shortness of breath at rest   [x] Shortness of breath with exertion. Vascular:  [] Pain in legs with walking   [] Pain in legs at rest   [] Pain in legs when laying flat   [] Claudication   [] Pain in feet when walking  [] Pain in feet at rest  [] Pain in feet when laying flat   [] History of DVT   [] Phlebitis   [] Swelling in legs   [] Varicose veins   [] Non-healing ulcers Pulmonary:   [] Uses home oxygen   [] Productive cough   [] Hemoptysis   [] Wheeze  [] COPD   [] Asthma Neurologic:  [] Dizziness  [] Blackouts   [] Seizures   [x] History of stroke   [] History of TIA  [] Aphasia   [] Temporary blindness   [] Dysphagia   [] Weakness or numbness in arms   [] Weakness or numbness in legs Musculoskeletal:  [x] Arthritis   [] Joint swelling   [] Joint pain   [x] Low back pain Hematologic:  [] Easy bruising  [] Easy bleeding   [] Hypercoagulable state   [] Anemic   Gastrointestinal:  [] Blood in stool   [] Vomiting blood  [] Gastroesophageal reflux/heartburn   [] Abdominal pain Genitourinary:  [] Chronic kidney disease   [] Difficult urination  [] Frequent urination  [] Burning with urination   [] Hematuria Skin:  [] Rashes   [] Ulcers   [] Wounds Psychological:  [] History of anxiety   []  History of major depression.  Physical Examination  BP (!) 151/87   Pulse 76  Gen:  WD/WN, NAD Head: [place]/AT, No temporalis wasting. Ear/Nose/Throat: Hearing grossly intact, nares w/o erythema or drainage Eyes: Conjunctiva clear. Sclera non-icteric.  Visual acuity is poor. Neck: Supple.  Trachea midline Pulmonary:  Good air movement, no use of accessory muscles.  Cardiac:  Irregular Vascular:  Vessel Right Left  Radial Palpable Palpable                          PT Palpable Palpable  DP Palpable Palpable   Gastrointestinal: soft, non-tender/non-distended. No guarding/reflex.  Musculoskeletal: Diffuse weakness throughout both lower extremities.  Rigidity is present in both knees and legs with difficulty extension.  Trace lower extremity edema Neurologic: Sensation grossly intact in extremities.  Symmetrical.  Speech is fluent.  Psychiatric: Judgment intact, Mood & affect appropriate for pt's clinical situation. Dermatologic: No rashes or ulcers noted.  No cellulitis or open wounds.     Labs No results found for this or any previous visit (from the past 2160 hours).  Radiology US  ARTERIAL LOWER EXTREMITY DUPLEX RIGHT(NON-ABI) Result Date: 11/22/2023 CLINICAL DATA:  Right groin pain.  Peripheral vascular disease. EXAM: RIGHT LOWER EXTREMITY ARTERIAL DUPLEX SCAN TECHNIQUE: Gray-scale sonography as well as color Doppler and duplex ultrasound was performed to evaluate the lower extremity arteries including the common, superficial and profunda femoral arteries, popliteal artery and calf arteries. COMPARISON:  CTA 10/05/2022 FINDINGS: Right/Left Lower Extremity ABI: Not obtained Inflow: Irregularity and atherosclerotic disease in the right common femoral artery. Right common femoral artery is patent with biphasic Doppler waveforms. Peak systolic velocity is 122 cm/sec. Outflow: Right profunda femoral artery is patent. Biphasic waveforms in the right SFA without focal velocity elevation. Extensive atherosclerotic disease in the right popliteal artery with biphasic waveforms and no significant velocity elevation. Runoff: Right posterior tibial artery is patent. Anterior tibial artery is patent with biphasic Doppler waveform. Dorsalis pedis artery is patent with biphasic Doppler waveform. IMPRESSION: 1. Right lower extremity arteries are patent with diffuse  atherosclerotic disease but no focal or significant stenosis. Electronically Signed  By: Juliene Balder M.D.   On: 11/22/2023 12:01    Assessment/Plan  AAA (abdominal aortic aneurysm) without rupture No new aneurysm related symptoms.  This does not appear to be peripheral embolization.  Perfusion remains intact distally.  Continue to follow-up with his regular duplex studies.  PAD (peripheral artery disease) He had an outpatient limited duplex study which suggested some diffuse atherosclerosis throughout the lower extremity on the right.  We did ABIs today to further elucidate his perfusion status.  Although his ABIs were likely falsely elevated due to medial calcification, they were greater than 1.3 bilaterally.  His right waveforms are strong and his digit pressure and waveform was normal.  He had triphasic flow in the right lower extremity and biphasic flow in the left lower extremity.  Left digital pressure was only mildly reduced at 93.  I do not think that PAD is the cause of his lower extremity symptoms.  I do not think any vascular intervention will be of benefit.  He has done some physical therapy in the past with no improvement.  I will defer further workup to his primary care physician.  Essential hypertension, benign blood pressure control important in reducing the progression of atherosclerotic disease and aneurysmal growth. On appropriate oral medications.     Hyperlipidemia lipid control important in reducing the progression of atherosclerotic disease. Continue statin therapy  Selinda Gu, MD  12/07/2023 11:42 AM    This note was created with Dragon medical transcription system.  Any errors from dictation are purely unintentional

## 2023-12-07 NOTE — Assessment & Plan Note (Signed)
 No new aneurysm related symptoms.  This does not appear to be peripheral embolization.  Perfusion remains intact distally.  Continue to follow-up with his regular duplex studies.

## 2023-12-10 LAB — VAS US ABI WITH/WO TBI

## 2023-12-17 ENCOUNTER — Ambulatory Visit (INDEPENDENT_AMBULATORY_CARE_PROVIDER_SITE_OTHER): Admitting: Vascular Surgery

## 2023-12-23 ENCOUNTER — Emergency Department

## 2023-12-23 ENCOUNTER — Inpatient Hospital Stay
Admission: EM | Admit: 2023-12-23 | Discharge: 2023-12-24 | DRG: 551 | Disposition: A | Discharge status: Hospice | Attending: Internal Medicine | Admitting: Internal Medicine

## 2023-12-23 ENCOUNTER — Other Ambulatory Visit: Payer: Self-pay

## 2023-12-23 DIAGNOSIS — R531 Weakness: Secondary | ICD-10-CM | POA: Diagnosis not present

## 2023-12-23 DIAGNOSIS — I700 Atherosclerosis of aorta: Secondary | ICD-10-CM | POA: Diagnosis present

## 2023-12-23 DIAGNOSIS — Z823 Family history of stroke: Secondary | ICD-10-CM

## 2023-12-23 DIAGNOSIS — I130 Hypertensive heart and chronic kidney disease with heart failure and stage 1 through stage 4 chronic kidney disease, or unspecified chronic kidney disease: Secondary | ICD-10-CM | POA: Diagnosis present

## 2023-12-23 DIAGNOSIS — N1831 Chronic kidney disease, stage 3a: Secondary | ICD-10-CM | POA: Diagnosis present

## 2023-12-23 DIAGNOSIS — I509 Heart failure, unspecified: Secondary | ICD-10-CM | POA: Diagnosis present

## 2023-12-23 DIAGNOSIS — E785 Hyperlipidemia, unspecified: Secondary | ICD-10-CM | POA: Diagnosis present

## 2023-12-23 DIAGNOSIS — Z8701 Personal history of pneumonia (recurrent): Secondary | ICD-10-CM

## 2023-12-23 DIAGNOSIS — Z79890 Hormone replacement therapy (postmenopausal): Secondary | ICD-10-CM

## 2023-12-23 DIAGNOSIS — Z85828 Personal history of other malignant neoplasm of skin: Secondary | ICD-10-CM

## 2023-12-23 DIAGNOSIS — I7123 Aneurysm of the descending thoracic aorta, without rupture: Secondary | ICD-10-CM | POA: Diagnosis present

## 2023-12-23 DIAGNOSIS — J449 Chronic obstructive pulmonary disease, unspecified: Secondary | ICD-10-CM | POA: Diagnosis present

## 2023-12-23 DIAGNOSIS — Z7902 Long term (current) use of antithrombotics/antiplatelets: Secondary | ICD-10-CM

## 2023-12-23 DIAGNOSIS — Y92009 Unspecified place in unspecified non-institutional (private) residence as the place of occurrence of the external cause: Secondary | ICD-10-CM

## 2023-12-23 DIAGNOSIS — S12101A Unspecified nondisplaced fracture of second cervical vertebra, initial encounter for closed fracture: Secondary | ICD-10-CM | POA: Diagnosis not present

## 2023-12-23 DIAGNOSIS — C3431 Malignant neoplasm of lower lobe, right bronchus or lung: Secondary | ICD-10-CM | POA: Diagnosis present

## 2023-12-23 DIAGNOSIS — H409 Unspecified glaucoma: Secondary | ICD-10-CM | POA: Diagnosis present

## 2023-12-23 DIAGNOSIS — I739 Peripheral vascular disease, unspecified: Secondary | ICD-10-CM | POA: Diagnosis present

## 2023-12-23 DIAGNOSIS — F0390 Unspecified dementia without behavioral disturbance: Secondary | ICD-10-CM | POA: Diagnosis present

## 2023-12-23 DIAGNOSIS — S12100A Unspecified displaced fracture of second cervical vertebra, initial encounter for closed fracture: Secondary | ICD-10-CM | POA: Diagnosis not present

## 2023-12-23 DIAGNOSIS — Z91013 Allergy to seafood: Secondary | ICD-10-CM

## 2023-12-23 DIAGNOSIS — Z87891 Personal history of nicotine dependence: Secondary | ICD-10-CM

## 2023-12-23 DIAGNOSIS — Z66 Do not resuscitate: Secondary | ICD-10-CM | POA: Diagnosis present

## 2023-12-23 DIAGNOSIS — C3491 Malignant neoplasm of unspecified part of right bronchus or lung: Secondary | ICD-10-CM

## 2023-12-23 DIAGNOSIS — Z8679 Personal history of other diseases of the circulatory system: Secondary | ICD-10-CM

## 2023-12-23 DIAGNOSIS — C7951 Secondary malignant neoplasm of bone: Secondary | ICD-10-CM | POA: Diagnosis present

## 2023-12-23 DIAGNOSIS — S129XXA Fracture of neck, unspecified, initial encounter: Secondary | ICD-10-CM | POA: Diagnosis present

## 2023-12-23 DIAGNOSIS — Z7189 Other specified counseling: Secondary | ICD-10-CM

## 2023-12-23 DIAGNOSIS — Z79899 Other long term (current) drug therapy: Secondary | ICD-10-CM

## 2023-12-23 DIAGNOSIS — I161 Hypertensive emergency: Secondary | ICD-10-CM | POA: Diagnosis present

## 2023-12-23 DIAGNOSIS — I2510 Atherosclerotic heart disease of native coronary artery without angina pectoris: Secondary | ICD-10-CM | POA: Diagnosis present

## 2023-12-23 DIAGNOSIS — C781 Secondary malignant neoplasm of mediastinum: Secondary | ICD-10-CM | POA: Diagnosis present

## 2023-12-23 DIAGNOSIS — Z7901 Long term (current) use of anticoagulants: Secondary | ICD-10-CM

## 2023-12-23 DIAGNOSIS — W010XXA Fall on same level from slipping, tripping and stumbling without subsequent striking against object, initial encounter: Secondary | ICD-10-CM | POA: Diagnosis present

## 2023-12-23 DIAGNOSIS — W19XXXA Unspecified fall, initial encounter: Principal | ICD-10-CM

## 2023-12-23 DIAGNOSIS — K828 Other specified diseases of gallbladder: Secondary | ICD-10-CM | POA: Diagnosis present

## 2023-12-23 DIAGNOSIS — H548 Legal blindness, as defined in USA: Secondary | ICD-10-CM | POA: Diagnosis present

## 2023-12-23 DIAGNOSIS — K219 Gastro-esophageal reflux disease without esophagitis: Secondary | ICD-10-CM | POA: Diagnosis present

## 2023-12-23 DIAGNOSIS — Z8673 Personal history of transient ischemic attack (TIA), and cerebral infarction without residual deficits: Secondary | ICD-10-CM

## 2023-12-23 DIAGNOSIS — Z7982 Long term (current) use of aspirin: Secondary | ICD-10-CM

## 2023-12-23 DIAGNOSIS — S0083XA Contusion of other part of head, initial encounter: Secondary | ICD-10-CM | POA: Diagnosis present

## 2023-12-23 DIAGNOSIS — R2681 Unsteadiness on feet: Secondary | ICD-10-CM | POA: Diagnosis present

## 2023-12-23 DIAGNOSIS — J9601 Acute respiratory failure with hypoxia: Secondary | ICD-10-CM | POA: Diagnosis present

## 2023-12-23 DIAGNOSIS — R296 Repeated falls: Secondary | ICD-10-CM | POA: Diagnosis present

## 2023-12-23 DIAGNOSIS — M1990 Unspecified osteoarthritis, unspecified site: Secondary | ICD-10-CM | POA: Diagnosis present

## 2023-12-23 DIAGNOSIS — Z515 Encounter for palliative care: Secondary | ICD-10-CM

## 2023-12-23 DIAGNOSIS — Z7984 Long term (current) use of oral hypoglycemic drugs: Secondary | ICD-10-CM

## 2023-12-23 LAB — URINALYSIS, ROUTINE W REFLEX MICROSCOPIC
Bacteria, UA: NONE SEEN
Bilirubin Urine: NEGATIVE
Glucose, UA: >=500 mg/dL — AB
Hgb urine dipstick: NEGATIVE
Ketones, ur: NEGATIVE mg/dL
Leukocytes,Ua: NEGATIVE
Nitrite: NEGATIVE
Protein, ur: NEGATIVE mg/dL
Specific Gravity, Urine: 1.015 (ref 1.005–1.030)
Squamous Epithelial / HPF: 0 /HPF (ref 0–5)
pH: 7 (ref 5.0–8.0)

## 2023-12-23 LAB — COMPREHENSIVE METABOLIC PANEL WITH GFR
ALT: 10 U/L (ref 0–44)
AST: 30 U/L (ref 15–41)
Albumin: 3.5 g/dL (ref 3.5–5.0)
Alkaline Phosphatase: 86 U/L (ref 38–126)
Anion gap: 13 (ref 5–15)
BUN: 22 mg/dL (ref 8–23)
CO2: 24 mmol/L (ref 22–32)
Calcium: 9.5 mg/dL (ref 8.9–10.3)
Chloride: 104 mmol/L (ref 98–111)
Creatinine, Ser: 1.08 mg/dL (ref 0.61–1.24)
GFR, Estimated: >60 mL/min (ref >60)
Glucose, Bld: 104 mg/dL — ABNORMAL HIGH (ref 70–99)
Potassium: 4.2 mmol/L (ref 3.5–5.1)
Sodium: 141 mmol/L (ref 135–145)
Total Bilirubin: 1.5 mg/dL — ABNORMAL HIGH (ref 0.0–1.2)
Total Protein: 7.1 g/dL (ref 6.5–8.1)

## 2023-12-23 LAB — CBC WITH DIFFERENTIAL/PLATELET
Abs Immature Granulocytes: 0.04 K/uL (ref 0.00–0.07)
Basophils Absolute: 0.1 K/uL (ref 0.0–0.1)
Basophils Relative: 1 %
Eosinophils Absolute: 0.5 K/uL (ref 0.0–0.5)
Eosinophils Relative: 5 %
HCT: 46 % (ref 39.0–52.0)
Hemoglobin: 14.8 g/dL (ref 13.0–17.0)
Immature Granulocytes: 0 %
Lymphocytes Relative: 20 %
Lymphs Abs: 1.9 K/uL (ref 0.7–4.0)
MCH: 27.4 pg (ref 26.0–34.0)
MCHC: 32.2 g/dL (ref 30.0–36.0)
MCV: 85 fL (ref 80.0–100.0)
Monocytes Absolute: 0.9 K/uL (ref 0.1–1.0)
Monocytes Relative: 10 %
Neutro Abs: 5.9 K/uL (ref 1.7–7.7)
Neutrophils Relative %: 64 %
Platelets: 223 K/uL (ref 150–400)
RBC: 5.41 MIL/uL (ref 4.22–5.81)
RDW: 15.7 % — ABNORMAL HIGH (ref 11.5–15.5)
WBC: 9.3 K/uL (ref 4.0–10.5)
nRBC: 0 % (ref 0.0–0.2)

## 2023-12-23 LAB — MAGNESIUM: Magnesium: 2.3 mg/dL (ref 1.7–2.4)

## 2023-12-23 LAB — TROPONIN I (HIGH SENSITIVITY)
Troponin I (High Sensitivity): 12 ng/L (ref <18)
Troponin I (High Sensitivity): 18 ng/L — ABNORMAL HIGH (ref <18)

## 2023-12-23 MED ORDER — SPIRONOLACTONE 12.5 MG HALF TABLET
12.5000 mg | ORAL_TABLET | Freq: Every day | ORAL | Status: DC
  Administered 2023-12-24: 12.5 mg via ORAL
  Filled 2023-12-23 (×2): qty 1

## 2023-12-23 MED ORDER — FUROSEMIDE 20 MG PO TABS: 20.0000 mg | ORAL_TABLET | Freq: Every day | ORAL | Status: DC

## 2023-12-23 MED ORDER — QUETIAPINE FUMARATE 25 MG PO TABS
25.0000 mg | ORAL_TABLET | Freq: Two times a day (BID) | ORAL | Status: DC
  Administered 2023-12-23: 25 mg via ORAL
  Filled 2023-12-23: qty 1

## 2023-12-23 MED ORDER — LORAZEPAM 0.5 MG PO TABS: 0.5000 mg | ORAL_TABLET | Freq: Once | ORAL | Status: DC | PRN

## 2023-12-23 MED ORDER — BENZONATATE 100 MG PO CAPS: 100.0000 mg | ORAL_CAPSULE | Freq: Three times a day (TID) | ORAL | Status: DC | PRN

## 2023-12-23 MED ORDER — TRAMADOL HCL 50 MG PO TABS: 50.0000 mg | ORAL_TABLET | Freq: Four times a day (QID) | ORAL | Status: DC | PRN

## 2023-12-23 MED ORDER — LOSARTAN POTASSIUM 25 MG PO TABS
25.0000 mg | ORAL_TABLET | Freq: Every day | ORAL | Status: DC
  Administered 2023-12-24: 25 mg via ORAL
  Filled 2023-12-23: qty 1

## 2023-12-23 MED ORDER — SODIUM CHLORIDE 0.9 % IV SOLN: 2.0000 g | Freq: Once | INTRAVENOUS | Status: DC

## 2023-12-23 MED ORDER — HYDRALAZINE HCL 20 MG/ML IJ SOLN: 5.0000 mg | Freq: Four times a day (QID) | INTRAMUSCULAR | Status: DC | PRN

## 2023-12-23 MED ORDER — CLOPIDOGREL BISULFATE 75 MG PO TABS
75.0000 mg | ORAL_TABLET | Freq: Every day | ORAL | Status: DC
  Administered 2023-12-24: 75 mg via ORAL
  Filled 2023-12-23: qty 1

## 2023-12-23 MED ORDER — LATANOPROST 0.005 % OP SOLN
1.0000 [drp] | Freq: Every day | OPHTHALMIC | Status: DC
  Administered 2023-12-23: 1 [drp] via OPHTHALMIC
  Filled 2023-12-23: qty 2.5

## 2023-12-23 MED ORDER — ONDANSETRON HCL 4 MG/2ML IJ SOLN: 4.0000 mg | Freq: Four times a day (QID) | INTRAMUSCULAR | Status: DC | PRN

## 2023-12-23 MED ORDER — ASPIRIN 81 MG PO TBEC
81.0000 mg | DELAYED_RELEASE_TABLET | Freq: Every day | ORAL | Status: DC
  Administered 2023-12-24: 81 mg via ORAL
  Filled 2023-12-23: qty 1

## 2023-12-23 MED ORDER — SODIUM CHLORIDE 0.9 % IV SOLN: 500.0000 mg | Freq: Once | INTRAVENOUS | Status: DC

## 2023-12-23 MED ORDER — DORZOLAMIDE HCL 2 % OP SOLN
1.0000 [drp] | Freq: Every day | OPHTHALMIC | Status: DC
  Filled 2023-12-23: qty 10

## 2023-12-23 MED ORDER — LEVOTHYROXINE SODIUM 50 MCG PO TABS
50.0000 ug | ORAL_TABLET | Freq: Every day | ORAL | Status: AC
Start: 2023-12-24 — End: ?
  Administered 2023-12-24: 50 ug via ORAL
  Filled 2023-12-23: qty 1

## 2023-12-23 MED ORDER — DOXYCYCLINE HYCLATE 100 MG PO TABS
100.0000 mg | ORAL_TABLET | Freq: Two times a day (BID) | ORAL | Status: DC
  Administered 2023-12-23 – 2023-12-24 (×2): 100 mg via ORAL
  Filled 2023-12-23 (×2): qty 1

## 2023-12-23 MED ORDER — HYDROMORPHONE HCL 1 MG/ML IJ SOLN
0.5000 mg | INTRAMUSCULAR | Status: AC | PRN
Start: 2023-12-23 — End: ?
  Administered 2023-12-23 – 2023-12-24 (×3): 0.5 mg via INTRAVENOUS
  Filled 2023-12-23 (×3): qty 0.5

## 2023-12-23 MED ORDER — ACETAMINOPHEN 500 MG PO TABS
1000.0000 mg | ORAL_TABLET | Freq: Once | ORAL | Status: AC
  Administered 2023-12-23: 1000 mg via ORAL
  Filled 2023-12-23: qty 2

## 2023-12-23 MED ORDER — GABAPENTIN 100 MG PO CAPS
100.0000 mg | ORAL_CAPSULE | Freq: Two times a day (BID) | ORAL | Status: DC
Start: 2023-12-23 — End: 2023-12-23

## 2023-12-23 MED ORDER — HYDRALAZINE HCL 20 MG/ML IJ SOLN
5.0000 mg | Freq: Once | INTRAMUSCULAR | Status: AC
  Administered 2023-12-23: 5 mg via INTRAVENOUS
  Filled 2023-12-23: qty 1

## 2023-12-23 MED ORDER — IOHEXOL 300 MG/ML  SOLN
80.0000 mL | Freq: Once | INTRAMUSCULAR | Status: AC | PRN
  Administered 2023-12-23: 80 mL via INTRAVENOUS

## 2023-12-23 MED ORDER — LATANOPROST 0.005 % OP SOLN: 1.0000 [drp] | Freq: Every day | OPHTHALMIC | Status: DC

## 2023-12-23 MED ORDER — ACETAMINOPHEN 650 MG RE SUPP: 650.0000 mg | Freq: Four times a day (QID) | RECTAL | Status: DC | PRN

## 2023-12-23 MED ORDER — OXYCODONE HCL 5 MG PO TABS
5.0000 mg | ORAL_TABLET | Freq: Four times a day (QID) | ORAL | Status: DC | PRN
  Administered 2023-12-24: 5 mg via ORAL
  Filled 2023-12-23: qty 1

## 2023-12-23 MED ORDER — ONDANSETRON HCL 4 MG PO TABS: 4.0000 mg | ORAL_TABLET | Freq: Four times a day (QID) | ORAL | Status: DC | PRN

## 2023-12-23 MED ORDER — PANTOPRAZOLE SODIUM 20 MG PO TBEC
20.0000 mg | DELAYED_RELEASE_TABLET | Freq: Every day | ORAL | Status: DC
  Administered 2023-12-24: 20 mg via ORAL
  Filled 2023-12-23 (×2): qty 1

## 2023-12-23 MED ORDER — ACETAMINOPHEN 325 MG PO TABS
650.0000 mg | ORAL_TABLET | Freq: Four times a day (QID) | ORAL | Status: DC | PRN
  Administered 2023-12-24 (×2): 650 mg via ORAL
  Filled 2023-12-23 (×2): qty 2

## 2023-12-23 MED ORDER — SENNOSIDES-DOCUSATE SODIUM 8.6-50 MG PO TABS: 1.0000 | ORAL_TABLET | Freq: Every evening | ORAL | Status: DC | PRN

## 2023-12-23 MED ORDER — BISACODYL 5 MG PO TBEC: 5.0000 mg | DELAYED_RELEASE_TABLET | Freq: Every day | ORAL | Status: DC | PRN

## 2023-12-23 MED ORDER — ENOXAPARIN SODIUM 40 MG/0.4ML IJ SOSY
40.0000 mg | PREFILLED_SYRINGE | INTRAMUSCULAR | Status: DC
  Administered 2023-12-23: 40 mg via SUBCUTANEOUS
  Filled 2023-12-23: qty 0.4

## 2023-12-23 MED ORDER — LOSARTAN POTASSIUM 50 MG PO TABS
25.0000 mg | ORAL_TABLET | Freq: Once | ORAL | Status: AC
  Administered 2023-12-23: 25 mg via ORAL
  Filled 2023-12-23: qty 1

## 2023-12-23 MED ORDER — GUAIFENESIN ER 600 MG PO TB12
1200.0000 mg | ORAL_TABLET | Freq: Two times a day (BID) | ORAL | Status: DC
  Administered 2023-12-23: 1200 mg via ORAL
  Filled 2023-12-23: qty 2

## 2023-12-23 MED ORDER — DORZOLAMIDE HCL-TIMOLOL MAL 2-0.5 % OP SOLN: 1.0000 [drp] | Freq: Two times a day (BID) | OPHTHALMIC | Status: DC

## 2023-12-23 NOTE — ED Triage Notes (Addendum)
 Pt arrived via EMS from home due to a fall. EMS sts that pt was sitting on the edge of the bed and fell and hit his head and has a skin tear on the left arm. Pt has a hematoma to the left eyebrow. EMS sts that pt has a systolic BP of 240's which per home health is pt baseline. Pt is blind. Pt does not take any blood thinners.

## 2023-12-23 NOTE — ED Provider Notes (Signed)
 Procedures  Clinical Course as of 12/23/23 1621  Thu Dec 23, 2023  1229 [facility] has been with him for 3 years [DS]  1449 Went to reassess, he is over in CT getting CT chest.  I discussed with his daughter who has now arrived.  We discussed workup so far, her concerns, plan of care.  She confirms that it has been greater than 1 month since he has had any antibiotics and this was for pneumonia 1-2 months ago. [DS]    Clinical Course User Index [DS] Claudene Rover, MD    ----------------------------------------- 4:21 PM on 12/23/2023 -----------------------------------------  CT chest shows pneumonia as well as likely malignancy.  With the patient's acute symptoms, will need to hospitalize.  Case discussed with the hospitalist for further management.  Low-dose IV hydralazine  ordered for initial blood pressure control    Viviann Pastor, MD 12/23/23 1621

## 2023-12-23 NOTE — H&P (Signed)
 History and Physical    Steve Bryant FMW:[id number] DOB: [birth year] DOA: 12/23/2023  PCP: Steve Lenis, MD (Confirm with patient/family/NH records and if not entered, this has to be entered at [facility] point of entry) Patient coming from: Home   Chief Complaint: Feeling weak and fell  HPI: Steve Bryant is a 89 y.o. male with medical history significant of HTN, HLD, COPD, stroke, GERD, AAA s/p repair, CKD stage IIIa, SSS, bilateral blindness, lumbar spine radiculopathy, presented with worsening of bilateral lower extremity weakness and fall.  Patient does have a chronic ambulation impairment which he read attributed to his lumbar spine problems, uses roller walker intermittently however last 2 to 3 weeks patient has had worsening of lower extremity weakness and he fell this morning after complaining about worsening of weakness especially on the right leg.  He hit his head but denied any LOC.  Complains about right hip and right chest pain.  Currently, patient has been having back pain for few months and right-sided chest pain worsening of his cough.  Last month patient was treated for LLL pneumonia.  He completed 1 course of p.o. antibiotics despite, he continued to cough up light-colored sputum. ED Course: Afebrile, nontachycardic blood pressure significant elevated 213/100, brain MRI negative for stroke.  Blood work showed WBC 9.3 hemoglobin 14.8 BUN 22 creatinine 1.0 K4.2.  CT chest abdomen pelvis showed LLL mass likely lung cancer with metastatic hilar adenopathy and mediastinum metastasis, T5 and L1 metastatic lesions.  CT cervical spine showed suspected C2 endplate fracture.  Patient was given ceftriaxone  and azithromycin  in the ED.  Review of Systems: As per HPI otherwise 14 point review of systems negative.    Past Medical History:  Diagnosis Date   AAA (abdominal aortic aneurysm)    a.) Abd US  12/22/16: measured 4.5 cm. b.) CTA 12/27/18: fusiform infrarenal AAA measured 4.7  cm. c.) Abd US  07/04/19: measured 5.0 cm. d.) CTA 11/03/19: measured 4.9 cm. e.) CTA 10/21/20: interval increase in size to 5.4 x 5.0 cm extending to the iliac bifurcation.   Acute ischemic stroke (HCC) 10/16/2016   a.) MRI --> 13 x 14 mm ischemic, non-hemorrhagic, RIGHT paramedian ventral pons territory   Aortic atherosclerosis    Arthritis    Basal cell carcinoma 06/25/2022   R preauricular, EDC done   BCC (basal cell carcinoma) 04/16/2022   right cheek, tx with [facility] 06/25/22   BCC (basal cell carcinoma) 04/16/2022   left prox pretibia, tx'd with EDC   Bilateral renal cysts    Bradycardia    Carotid artery disease    a.) Doppler 07/02/09: RIGHT 50-75%, LEFT < 50%. b.) CTA 07/04/09: RIGHT 90%. LEFT 50-75%. c.) CTA 10/15/2016: RIGHT with no significant stenosis, LEFT 60%.   CHF (congestive heart failure) (HCC)    Chronic anticoagulation    a.) DAPT therapy (ASA + clopidogrel )   Coronary artery disease    DDD (degenerative disc disease), lumbar    Diastolic dysfunction 04/16/2020   a.) TTE 04/16/20: EF 60-65%; G1DD.   DOE (dyspnea on exertion)    Dyspnea    GERD (gastroesophageal reflux disease)    Glaucoma    Hyperlipidemia    Hypertension    Legally blind    Neck mass    a.) CT 07/04/09: 4.9 cm supraclavicular mass just posterior to clavicle; PET CT 07/05/09 showed mass to be FDG avid with max SUV of 6. b.) CT 10/15/16: measured 4 x 5 cm. c.) CT 04/15/20: measured 5.1 x 3.7 cm.  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 WD SCC. EDC   Squamous cell carcinoma of skin 06/15/2019   Right calf. KA-type. EDC   Squamous cell carcinoma of skin 11/13/2020   Right distal hand (in situ) - EDC    Squamous cell carcinoma of skin 11/13/2020   Right prox hand (in situ) - EDC   TIA (transient ischemic attack) 04/15/2020   Valvular regurgitation    a.) TTE 10/17/2016 : EF 55-60%; mild AR/MR. b.) TTE 04/16/2020: EF 60-65%; G1DD, mild AR, triv. MR. c.) TTE 12/09/20: EF >55%; mild AR/TR, triv. PR, mod. MR.    Past Surgical History:  Procedure Laterality Date   CERVICAL LAMINECTOMY N/A 05/2006   Fusion C3-C7   EMBOLIZATION (CATH LAB) N/A 10/29/2022   Procedure: EMBOLIZATION;  Surgeon: Marea Selinda RAMAN, MD;  Location: [facility] INVASIVE CV LAB;  Service: Cardiovascular;  Laterality: N/A;   ENDOVASCULAR STENT GRAFT (AAA) N/A 12/25/2020   Procedure: ENDOVASCULAR REPAIR/STENT GRAFT;  Surgeon: Marea Selinda RAMAN, MD;  Location: [facility] INVASIVE CV LAB;  Service: Cardiovascular;  Laterality: N/A;   GLAUCOMA SURGERY  2003   INGUINAL HERNIA REPAIR Right 2007   TUMOR REMOVAL Right 1977   Thoracotomy with RUL pulmonary mass resection     reports that he quit smoking about 46 years ago. His smoking use included cigarettes. He started smoking about 71 years ago. He has a 25 pack-year smoking history. He has never used smokeless tobacco. He reports that he does not currently use alcohol. He reports that he does not use drugs.  Allergies  Allergen Reactions   Shellfish Protein-Containing Drug Products Nausea Only    Scallops    Family History  Problem Relation Age of Onset   Stroke Mother    Stroke Father    Cancer Brother      Prior to Admission medications   Medication Sig Start Date End Date Taking? Authorizing Provider  aspirin  EC 81 MG tablet Take 81 mg by mouth daily.   Yes [provider]  clopidogrel  (PLAVIX ) 75 MG tablet Take 75 mg by mouth daily. 06/09/18  Yes [provider]  dorzolamide  (TRUSOPT ) 2 % ophthalmic solution Place 1 drop into the right eye daily with lunch. 08/12/20  Yes [provider]  dorzolamide -timolol  (COSOPT ) 22.3-6.8 MG/ML ophthalmic solution Place 1  drop into both eyes 2 (two) times daily. 08/10/16  Yes [provider]  furosemide (LASIX) 20 MG tablet Take 20 mg by mouth daily. 10/29/23  Yes [provider]  gabapentin  (NEURONTIN ) 100 MG capsule Take 100 mg by mouth 2 (two) times daily with a meal. Pt stated he takes 200 mg in the morning and 100 mg in the evening 05/09/19  Yes [provider]  JARDIANCE 10 MG TABS tablet Take 10 mg by mouth daily.   Yes [provider]  latanoprost  (XALATAN ) 0.005 % ophthalmic solution Place 1 drop into the right eye at bedtime. 11/16/13  Yes [provider]  levothyroxine  (SYNTHROID ) 50 MCG tablet Take 50 mcg by mouth daily. 07/08/21  Yes [provider]  losartan  (COZAAR ) 25 MG tablet Take 1 tablet (25 mg total) by mouth daily. 10/16/16  Yes Kalisetti,  Lavenia, MD  Multiple Vitamin (MULTIVITAMIN) tablet Take 1 tablet by mouth daily.   Yes [provider]  MYRBETRIQ  25 MG TB24 tablet Take 25 mg by mouth daily. 05/05/21  Yes [provider]  pantoprazole  (PROTONIX ) 20 MG tablet Take 20 mg by mouth daily. 07/19/20  Yes [provider]  pregabalin  (LYRICA ) 75 MG capsule Take 75 mg by mouth 3 (three) times daily.   Yes [provider]  QUEtiapine  (SEROQUEL ) 25 MG tablet Take 25 mg by mouth 2 (two) times daily.   Yes [provider]  spironolactone (ALDACTONE) 25 MG tablet Take 12.5 mg by mouth. 12/09/23 12/08/24 Yes [provider]  traMADol  (ULTRAM ) 50 MG tablet Take 1 tablet (50 mg total) by mouth every 6 (six) hours as needed for up to 8 doses. 01/26/23  Yes Paci, Karina M, MD  mupirocin  ointment (BACTROBAN ) 2 % Apply 1 Application topically 2 (two) times daily. To wound under a bandage Patient not taking: Reported on 12/23/2023 02/24/23   Corey Rufus HERO, MD    Physical Exam: Vitals:   12/23/23 1219 12/23/23 1230 12/23/23 1400 12/23/23 1721  BP: (!) 217/92 (!) 213/99 (!) 201/88 (!) 213/112  Pulse: 68 65 (!) 59  79  Resp: 17 16 13 20   Temp: 98 F (36.7 C)   98.1 F (36.7 C)  TempSrc: Oral   Oral  SpO2: 98% 100% 98% 99%  Weight:      Height:        Constitutional: NAD, calm, comfortable Vitals:   12/23/23 1219 12/23/23 1230 12/23/23 1400 12/23/23 1721  BP: (!) 217/92 (!) 213/99 (!) 201/88 (!) 213/112  Pulse: 68 65 (!) 59 79  Resp: 17 16 13 20   Temp: 98 F (36.7 C)   98.1 F (36.7 C)  TempSrc: Oral   Oral  SpO2: 98% 100% 98% 99%  Weight:      Height:       Eyes: PERRL, lids and conjunctivae normal ENMT: Mucous membranes are moist. Posterior pharynx clear of any exudate or lesions.Normal dentition.  Neck: normal, supple, no masses, no thyromegaly Respiratory: clear to auscultation bilaterally, no wheezing, no crackles. Normal respiratory effort. No accessory muscle use.  Cardiovascular: Regular rate and rhythm, no murmurs / rubs / gallops. No extremity edema. 2+ pedal pulses. No carotid bruits.  Abdomen: no tenderness, no masses palpated. No hepatosplenomegaly. Bowel sounds positive.  Musculoskeletal: no clubbing / cyanosis. No joint deformity upper and lower extremities. Good ROM, no contractures. Normal muscle tone.  Skin: no rashes, lesions, ulcers. No induration Neurologic: CN 2-12 grossly intact. Sensation intact, DTR normal. Strength 4/5 bilateral lower extremities, worse on right side.  Psychiatric: Normal judgment and insight. Alert and oriented x 3. Normal mood.     Labs on Admission: I have personally reviewed following labs and imaging studies  CBC: Recent Labs  Lab 12/23/23 1224  WBC 9.3  NEUTROABS 5.9  HGB 14.8  HCT 46.0  MCV 85.0  PLT 223   Basic Metabolic Panel: Recent Labs  Lab 12/23/23 1224  NA 141  K 4.2  CL 104  CO2 24  GLUCOSE 104*  BUN 22  CREATININE 1.08  CALCIUM  9.5  MG 2.3   GFR: Estimated Creatinine Clearance: 39.6 mL/min (by C-G formula based on SCr of 1.08 mg/dL). Liver Function Tests: Recent Labs  Lab 12/23/23 1224  AST 30  ALT  10  ALKPHOS 86  BILITOT 1.5*  PROT 7.1  ALBUMIN 3.5   No results for input(s):  LIPASE, AMYLASE in the last 168 hours. No results for input(s): AMMONIA in the last 168 hours. Coagulation Profile: No results for input(s): INR, PROTIME in the last 168 hours. Cardiac Enzymes: No results for input(s): CKTOTAL, CKMB, CKMBINDEX, TROPONINI in the last 168 hours. BNP (last 3 results) No results for input(s): PROBNP in the last 8760 hours. HbA1C: No results for input(s): HGBA1C in the last 72 hours. CBG: No results for input(s): GLUCAP in the last 168 hours. Lipid Profile: No results for input(s): CHOL, HDL, LDLCALC, TRIG, CHOLHDL, LDLDIRECT in the last 72 hours. Thyroid Function Tests: No results for input(s): TSH, T4TOTAL, FREET4, T3FREE, THYROIDAB in the last 72 hours. Anemia Panel: No results for input(s): VITAMINB12, FOLATE, FERRITIN, TIBC, IRON, RETICCTPCT in the last 72 hours. Urine analysis:    Component Value Date/Time   COLORURINE YELLOW (A) 12/23/2023 1224   APPEARANCEUR CLEAR (A) 12/23/2023 1224   LABSPEC 1.015 12/23/2023 1224   PHURINE 7.0 12/23/2023 1224   GLUCOSEU >=500 (A) 12/23/2023 1224   HGBUR NEGATIVE 12/23/2023 1224   BILIRUBINUR NEGATIVE 12/23/2023 1224   KETONESUR NEGATIVE 12/23/2023 1224   PROTEINUR NEGATIVE 12/23/2023 1224   NITRITE NEGATIVE 12/23/2023 1224   LEUKOCYTESUR NEGATIVE 12/23/2023 1224    Radiological Exams on Admission: MR BRAIN WO CONTRAST Result Date: 12/23/2023 CLINICAL DATA:  Stroke.  Acute right leg weakness.  Fall. EXAM: MRI HEAD WITHOUT CONTRAST TECHNIQUE: Multiplanar, multiecho pulse sequences of the brain and surrounding structures were obtained without intravenous contrast. COMPARISON:  Head CT 12/23/2023 and MRI 04/15/2020 FINDINGS: Brain: There is no evidence of an acute infarct, mass, midline shift, or extra-axial fluid collection. Chronic microhemorrhages are again seen in the  cerebellum and brainstem as well as a few supratentorial chronic microhemorrhages including in the right thalamus and basal ganglia, potentially secondary to chronic hypertension. There is moderately advanced cerebral atrophy. Patchy to confluent T2 hyperintensities in the cerebral white matter bilaterally and in the pons are stable to slightly increased from the prior MRI and are nonspecific but compatible with severe chronic small vessel ischemic disease. Chronic infarcts are again noted in the deep gray nuclei, pons, and cerebellum. Vascular: Major intracranial vascular flow voids are preserved. Skull and upper cervical spine: Unremarkable bone marrow signal. Partially visualized anterior cervical spine fusion. Sinuses/Orbits: Bilateral cataract extraction. Mild-to-moderate mucosal thickening in the paranasal sinuses. Clear mastoid air cells. Other: Left forehead scalp hematoma. IMPRESSION: 1. No acute intracranial abnormality. 2. Severe chronic small vessel ischemic disease. Electronically Signed   By: Dasie Hamburg M.D.   On: 12/23/2023 16:44   CT Chest W Contrast Result Date: 12/23/2023 CLINICAL DATA:  Fall.  Abnormal hilar prominence. * Tracking Code: BO * EXAM: CT CHEST WITH CONTRAST TECHNIQUE: Multidetector CT imaging of the chest was performed during intravenous contrast administration. RADIATION DOSE REDUCTION: This exam was performed according to the departmental dose-optimization program which includes automated exposure control, adjustment of the mA and/or kV according to patient size and/or use of iterative reconstruction technique. CONTRAST:  80mL OMNIPAQUE  IOHEXOL  300 MG/ML  SOLN COMPARISON:  Chest radiograph 12/23/2023 FINDINGS: Cardiovascular: Right hilar adenopathy exerts some extrinsic mass effect on right pulmonary vascular structures. Coronary, aortic arch, and branch vessel atherosclerotic vascular disease. Prominent coronary sinus 4.0 cm in diameter. Partially thrombosed descending  thoracic aortic aneurysm 4.1 cm in diameter. Mediastinum/Nodes: Right upper mediastinal mass 5.5 by 3.9 by 4.3 cm, internal heterogeneous density, previously 5.4 by 3.9 cm on 04/15/2020. AP window lymph node 1.1 cm in short axis on image 59  series 2. Substantial right hilar and suprahilar adenopathy including a right hilar node measuring 2.0 cm in short axis on image 78 series 2. Right subcarinal node 1.5 cm in short axis, image 80 series 2. Lungs/Pleura: Superior segment right lower lobe mass 6.2 by 3.7 cm on image 82 series 2. Prior wedge resection of part of the right upper lobe. Postobstructive pneumonitis or pneumonia in the right lower lobe. Subsolid right lower lobe nodule 1.3 by 0.8 cm on image 98 series 4. Honeycombing at the lung bases. Clustered nodularity medially in the superior segment left lower lobe on images 62 through 85 of series 4, infectious etiology favored. Calcified pleural plaques at the right lung apex. 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 The partially thrombosed descending thoracic aortic aneurysm extends into the suprarenal abdominal aorta. Notable atheromatous plaque in the abdominal aorta. Narrowed but not occluded celiac trunk likely due to median arcuate ligament. Narrowed proximal SMA due to atheromatous plaque. Punctate calcifications in the spleen favoring old granulomatous disease. Musculoskeletal: Right second, third, and fourth rib deformities which may be postoperative. Lytic lesion of the right sixth rib measuring 1.8 by 1.2 cm on image 84 series 4. Small lytic lesion of the left third rib anteriorly. 2.0 cm lytic lesion of the anterior T5 vertebral body on image 116 series 6. 1.1 cm lytic lesion of the anterior T6 vertebral body. Schmorl's node along the inferior endplate of T12.  Schmorl's node versus lytic lesion along the right superior endplate of L1. Lower cervical plate and screw fixator. IMPRESSION: 1. Superior segment right lower lobe mass 6.2 by 3.7 cm, suspicious for malignancy. 2. Right hilar and mediastinal adenopathy, suspicious for malignancy. 3. Lytic lesions of the right sixth rib, left third rib, T5 vertebral body, and possibly the L1 vertebral body, suspicious for metastatic disease. 4. Subsolid right lower lobe nodule 1.3 by 0.8 cm. 5. Clustered nodularity medially in the superior segment left lower lobe, infectious etiology favored, but surveillance suggested. 6. Porcelain gallbladder versus very large gallstone filling the gallbladder. 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 7. Partially thrombosed descending thoracic aortic aneurysm 4.1 cm in diameter. This extends into a suprarenal abdominal aortic aneurysm measuring 4.1 cm. This has been followed with CT angiography, consider annual CTA of the chest, abdomen, and pelvis. 8. Narrowed but not occluded celiac trunk likely due to median arcuate ligament. Narrowed proximal SMA due to atheromatous plaque. 9. Honeycombing at the lung bases compatible with interstitial lung disease. 10.  Aortic Atherosclerosis (ICD10-I70.0). Electronically Signed   By: Ryan Salvage M.D.   On: 12/23/2023 15:10   DG Chest Portable 1 View Result Date: 12/23/2023 EXAM: 1 VIEW(S) XRAY OF THE CHEST 12/23/2023 01:28:00 PM COMPARISON: Comparison 07/10/2021. CLINICAL HISTORY: fall. Pt arrived via EMS from home due to a fall. EMS sts that pt was sitting on the edge of the bed and fell and hit his head and has a skin tear on the left arm. Pt has a hematoma to the left eyebrow. EMS sts that pt has a systolic BP of 240's which per ; home health is pt baseline. Pt is blind. FINDINGS: LUNGS AND PLEURA: Postsurgical changes are seen in right upper lobe.  Right hilar prominence is noted concerning for possible mass or adenopathy. CT scan of the chest with intravenous  contrast is recommended for further evaluation. Calcified pleural plaques are seen in left lung apex. No focal pulmonary opacity. No pulmonary edema. No pleural effusion. No pneumothorax. HEART AND MEDIASTINUM: Aortic atherosclerosis. No acute abnormality of the cardiac and mediastinal silhouettes. BONES AND SOFT TISSUES: No acute osseous abnormality. IMPRESSION: 1. Right hilar prominence, concerning for a mass or adenopathy; recommend contrast-enhanced chest CT for further evaluation. 2. Postsurgical changes in the right upper lobe. 3. Calcified pleural plaques in the left lung apex. 4. Aortic atherosclerosis. Electronically signed by: Lynwood Seip MD 12/23/2023 02:01 PM EDT RP Workstation: [id number]   DG Hip Unilat W or Wo Pelvis 2-3 Views Right Result Date: 12/23/2023 EXAM: 2 or 3 VIEW(S) XRAY OF THE RIGHT HIP 12/23/2023 01:28:00 PM COMPARISON: None available. CLINICAL HISTORY: fall. EMS sts that pt was sitting on the edge of the bed and fell and hit his head and has a skin tear on the left arm. FINDINGS: BONES AND JOINTS: No acute fracture or focal osseous lesion. The hip joint is maintained. No significant degenerative changes. SOFT TISSUES: The soft tissues are unremarkable. IMPRESSION: 1. No significant abnormality in the right hip or visualized pelvis. Electronically signed by: Lynwood Seip MD 12/23/2023 01:59 PM EDT RP Workstation: [id number]   CT Cervical Spine Wo Contrast Result Date: 12/23/2023 EXAM: CT CERVICAL SPINE WITHOUT CONTRAST 12/23/2023 01:02:34 PM TECHNIQUE: CT of the cervical spine was performed without the administration of intravenous contrast. Multiplanar reformatted images are provided for review. Automated exposure control, iterative reconstruction, and/or weight based adjustment of the mA/kV was utilized to reduce the radiation dose to as low as reasonably achievable.  COMPARISON: CT cervical spine 321 22. CLINICAL HISTORY: Fall. Pt arrived via EMS from home due to a fall. EMS states that pt was sitting on the edge of the bed and fell and hit his head and has a skin tear on the left arm. Pt has a hematoma to the left eyebrow. EMS states that pt has a systolic BP of 240's which per home health is pt baseline. Pt is blind. Pt does not take any blood thinners. FINDINGS: CERVICAL SPINE: BONES AND ALIGNMENT: Anterior cervical fusion hardware spanning C3-C7. Hardware is intact. There is solid osseous fusion from C4-C7. No definite solid osseous fusion across the C3-C4 level. No traumatic malalignment. Vertebral body heights are maintained. There is a vertically oriented lucency through the osteophytes along the C2 inferior endplate which is new from prior concerning for fractured osteophyte. Chronic deformity of the right 2nd rib. DEGENERATIVE CHANGES: Facet arthrosis and uncovertebral hypertrophy at multiple levels throughout the cervical spine. Disc osteophyte complexes at multiple levels. Mild spinal canal stenosis at C3-C4. There is at least moderate foraminal stenosis at multiple levels. SOFT TISSUES: No prevertebral soft tissue swelling. Similar appearance of a partially visualized hypoattenuating mass at the right lung apex/superior mediastinum which measures up to 5.3 cm. Bilateral pleural calcifications. IMPRESSION: 1. Vertically oriented lucency through osteophytes at the C2 inferior endplate, concerning for fractured osteophyte. Consider MRI for further evaluation. 2. Anterior cervical fusion hardware spanning C3C7. 3. Degenerative changes as above. 4. Similar appearance of right lung apex/superior mediastinal mass. Electronically signed by: Donnice Mania MD 12/23/2023 01:42 PM EDT RP Workstation: [id number]   CT HEAD WO CONTRAST ( ) Result Date: 12/23/2023 EXAM: CT HEAD WITHOUT CONTRAST 12/23/2023 01:02:34 PM TECHNIQUE: CT of the head was performed without the  administration of intravenous contrast. Automated exposure control, iterative reconstruction, and/or weight based adjustment of the mA/kV was utilized to reduce the radiation  dose to as low as reasonably achievable. COMPARISON: CT head 12/24/2021. CLINICAL HISTORY: Fall on DAPT, hypertensive, eval ICH. Pt arrived via EMS from home due to a fall. EMS states that pt was sitting on the edge of the bed and fell and hit his head and has a skin tear on the left arm. Pt has a hematoma to the left eyebrow. EMS states that pt has a systolic BP of 240s which per home health is pt baseline. Pt is blind. Pt does not take any blood thinners. FINDINGS: BRAIN AND VENTRICLES: No acute hemorrhage. No evidence of acute infarct. Patchy periventricular and subcortical white matter low-density changes compatible with chronic microvascular ischemic change. Chronic bilateral cerebellar infarcts. Moderate diffuse cerebral volume loss. No hydrocephalus. No extra-axial collection. No mass effect or midline shift. Atherosclerotic calcifications involving the large vessels of the skull base. ORBITS: Bilateral lens replacement. SINUSES: Complete opacification of the left frontal sinus, slightly increased from prior. Scattered mucosal thickening in the ethmoid sinuses with near complete opacification of the left ethmoid sinus. Mucosal thickening in the visualized maxillary sinuses, slightly increased from prior. Complete opacification of the left sphenoid sinus with findings suggestive of chronic sinusitis. SOFT TISSUES AND SKULL: Left frontal scalp hematoma. Similar chronic postsurgical changes of the anterior frontal bones bilaterally. There is no evidence of acute calvarial fracture. IMPRESSION: 1. No acute intracranial abnormality. 2. Left frontal scalp hematoma. 3. Chronic bilateral cerebellar infarcts. 4. Chronic sinusitis as above. Overall, increased opacification of the sinuses compared to prior. Electronically signed by: Donnice Mania MD  12/23/2023 01:33 PM EDT RP Workstation: [id number]    EKG: Independently reviewed.  Sinus rhythm, no acute ST changes.  Assessment/Plan Principal Problem:   Closed cervical spine fracture (HCC) Active Problems:   C2 cervical fracture (HCC)   Metastatic lung cancer (metastasis from lung to other site), right [facility])  (please populate well all problems here in Problem List. (For example, if patient is on BP meds at home and you resume or decide to hold them, it is a problem that needs to be her. Same for CAD, COPD, HLD and so on)  Fall Bilateral lower extremity weakness - PT evaluation - Bryant etiology appears to be probably worsening of lumbar radiculopathy especially L4-L5 and L5-S1 shown on the recent lumbar spine MRI earlier this year.  According to his neurologist note on 9/30, was attributed to possible neuro pain and gabapentin  dosage was increased.  Patient also takes Lyrica .  For now we will hold off Lyrica  given the patient's unsteady gait. 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  Brain MRI negative for stroke. -PT  Incidental finding of metastatic RLL cancer - Discussed with patient and his daughter at bedside, went through the image study.  Prognosis probably poor given the already fast reaching metastatic lesions to thoracic and lumbar spine. - Patient and family agreed on focus on symptomatic control.  But both of them do want to hear from oncology about possible therapeutic options.  Oncology consulted, who will see the patient tomorrow.  They will also agree with palliative care consult.  Confirmed with family that patient is DNR/DNI.  C2 inferior endplate fracture - Discussed with on-call neurosurgery who recommended hard neck collar.  Patient and family however refused neck collar. - As per recommendation from neurosurgeon, a cervical spine MRI ordered.  Probably obstructive pneumonia -  Family reported that the patient was treated for RLL pneumonia recently.  Went through patient records showed patient had first pneumonia on RLL in May and subsequently was treated for pneumonia 2 times for the same RLL.  Likely secondary to tumor obstructions of distal airways, given that patient does not have hypoxia or leukocytosis or fever, will downgrade antibiotic treatment to p.o. doxycycline.  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  Outpatient follow-up with vascular surgeon.  COPD - Stable, continue as needed breathing meds  Total time spent on patient care 75 minutes.  DVT prophylaxis: Heparin  subcu Code Status: DNR/DNI Family Communication: Daughter at bedside Disposition Plan: Expect less than 2 midnight hospital stay Consults called: Oncology and palliative care Admission status: Telemetry observation   Cort ONEIDA Mana MD [facility] Pager [phone]  12/23/2023, 5:39 PM

## 2023-12-23 NOTE — ED Provider Notes (Signed)
 [facility] Provider Note    Event Date/Time   First MD Initiated Contact with Patient 12/23/23 1212     (approximate)   History   Fall   HPI  Steve Bryant is a 89 y.o. male who presents to the ED for evaluation of Fall   Review a neurology clinic visit from 9/30.  History of HTN, dementia, stroke on DAPT.  Frequent falls. CHF  Patient presents to the ED from home via EMS for evaluation of a fall.  Much of history is provided by patient's caregiver, Angie.  Patient lives at home generally by himself but with 24/7 caregivers.  Blind at baseline.  Ambulatory with a walker often.  They report a fall that occurred this morning just prior to arrival or patient tripped and fell causing him to strike his head on the floor.  He reports feeling fine here and has no complaints, denies any pain.  He and caregiver both report primary concern with progressive weakness to his right leg   Physical Exam   Triage Vital Signs: ED Triage Vitals  Encounter Vitals Group     BP      Girls Systolic BP Percentile      Girls Diastolic BP Percentile      Boys Systolic BP Percentile      Boys Diastolic BP Percentile      Pulse      Resp      Temp      Temp src      SpO2      Weight      Height      Head Circumference      Peak Flow      Pain Score      Pain Loc      Pain Education      Exclude from Growth Chart     Most recent vital signs: Vitals:   12/23/23 1230 12/23/23 1400  BP: (!) 213/99 (!) 201/88  Pulse: 65 (!) 59  Resp: 16 13  Temp:    SpO2: 100% 98%    General: Awake, no distress.  Pleasant.  Mildly hard of hearing CV:  Good peripheral perfusion.  Resp:  Normal effort.  Abd:  No distention.  MSK:  No deformity noted.  Hematoma to the forehead that is closed without signs of EOM entrapment or proptosis.  Couple scattered skin tears to his left elbow, left knee without discrete laceration to require suture repair. Neuro:  No focal  deficits appreciated.  Full sensation throughout.  No clear cranial nerve deficits. Strength intact in bilateral upper extremities with bilateral arm raise, right thumb stays extended reportedly from previous stroke.  Right arm is somewhat tremulous with raising but he is able to keep it up. Left leg strength intact,  right leg with 2/5 strength he reports right hip pain and difficulty raising his leg due to pain. Other:     ED Results / Procedures / Treatments   Labs (all labs ordered are listed, but only abnormal results are displayed) Labs Reviewed  COMPREHENSIVE METABOLIC PANEL WITH GFR - Abnormal; Notable for the following components:      Result Value   Glucose, Bld 104 (*)    Total Bilirubin 1.5 (*)    All other components within normal limits  CBC WITH DIFFERENTIAL/PLATELET - Abnormal; Notable for the following components:   RDW 15.7 (*)    All other components within normal limits  URINALYSIS, ROUTINE W REFLEX  MICROSCOPIC - Abnormal; Notable for the following components:   Color, Urine YELLOW (*)    APPearance CLEAR (*)    Glucose, UA >=500 (*)    All other components within normal limits  MAGNESIUM   TROPONIN I (HIGH SENSITIVITY)  TROPONIN I (HIGH SENSITIVITY)    EKG Sinus rhythm with a rate of 64 bpm.  Normal axis and intervals without clear signs of acute ischemia.  RADIOLOGY CT head interpreted by me without evidence of acute intracranial pathology CT cervical spine interpreted by me without evidence of fracture or dislocation 1 view CXR interpreted by me with right hilar infiltrate/mass Plain film of the pelvis and right hip interpreted by me without evidence of acute fracture or dislocation  Official radiology report(s): DG Chest Portable 1 View Result Date: 12/23/2023 EXAM: 1 VIEW(S) XRAY OF THE CHEST 12/23/2023 01:28:00 PM COMPARISON: Comparison 07/10/2021. CLINICAL HISTORY: fall. Pt arrived via EMS from home due to a fall. EMS sts that pt was sitting on the  edge of the bed and fell and hit his head and has a skin tear on the left arm. Pt has a hematoma to the left eyebrow. EMS sts that pt has a systolic BP of 240's which per ; home health is pt baseline. Pt is blind. FINDINGS: LUNGS AND PLEURA: Postsurgical changes are seen in right upper lobe. Right hilar prominence is noted concerning for possible mass or adenopathy. CT scan of the chest with intravenous contrast is recommended for further evaluation. Calcified pleural plaques are seen in left lung apex. No focal pulmonary opacity. No pulmonary edema. No pleural effusion. No pneumothorax. HEART AND MEDIASTINUM: Aortic atherosclerosis. No acute abnormality of the cardiac and mediastinal silhouettes. BONES AND SOFT TISSUES: No acute osseous abnormality. IMPRESSION: 1. Right hilar prominence, concerning for a mass or adenopathy; recommend contrast-enhanced chest CT for further evaluation. 2. Postsurgical changes in the right upper lobe. 3. Calcified pleural plaques in the left lung apex. 4. Aortic atherosclerosis. Electronically signed by: Lynwood Seip MD 12/23/2023 02:01 PM EDT RP Workstation: [id number]   DG Hip Unilat W or Wo Pelvis 2-3 Views Right Result Date: 12/23/2023 EXAM: 2 or 3 VIEW(S) XRAY OF THE RIGHT HIP 12/23/2023 01:28:00 PM COMPARISON: None available. CLINICAL HISTORY: fall. EMS sts that pt was sitting on the edge of the bed and fell and hit his head and has a skin tear on the left arm. FINDINGS: BONES AND JOINTS: No acute fracture or focal osseous lesion. The hip joint is maintained. No significant degenerative changes. SOFT TISSUES: The soft tissues are unremarkable. IMPRESSION: 1. No significant abnormality in the right hip or visualized pelvis. Electronically signed by: Lynwood Seip MD 12/23/2023 01:59 PM EDT RP Workstation: [id number]   CT Cervical Spine Wo Contrast Result Date: 12/23/2023 EXAM: CT CERVICAL SPINE WITHOUT CONTRAST 12/23/2023 01:02:34 PM TECHNIQUE: CT of the cervical spine was  performed without the administration of intravenous contrast. Multiplanar reformatted images are provided for review. Automated exposure control, iterative reconstruction, and/or weight based adjustment of the mA/kV was utilized to reduce the radiation dose to as low as reasonably achievable. COMPARISON: CT cervical spine 321 22. CLINICAL HISTORY: Fall. Pt arrived via EMS from home due to a fall. EMS states that pt was sitting on the edge of the bed and fell and hit his head and has a skin tear on the left arm. Pt has a hematoma to the left eyebrow. EMS states that pt has a systolic BP of 240's which per home health is pt baseline. Pt  is blind. Pt does not take any blood thinners. FINDINGS: CERVICAL SPINE: BONES AND ALIGNMENT: Anterior cervical fusion hardware spanning C3-C7. Hardware is intact. There is solid osseous fusion from C4-C7. No definite solid osseous fusion across the C3-C4 level. No traumatic malalignment. Vertebral body heights are maintained. There is a vertically oriented lucency through the osteophytes along the C2 inferior endplate which is new from prior concerning for fractured osteophyte. Chronic deformity of the right 2nd rib. DEGENERATIVE CHANGES: Facet arthrosis and uncovertebral hypertrophy at multiple levels throughout the cervical spine. Disc osteophyte complexes at multiple levels. Mild spinal canal stenosis at C3-C4. There is at least moderate foraminal stenosis at multiple levels. SOFT TISSUES: No prevertebral soft tissue swelling. Similar appearance of a partially visualized hypoattenuating mass at the right lung apex/superior mediastinum which measures up to 5.3 cm. Bilateral pleural calcifications. IMPRESSION: 1. Vertically oriented lucency through osteophytes at the C2 inferior endplate, concerning for fractured osteophyte. Consider MRI for further evaluation. 2. Anterior cervical fusion hardware spanning C3C7. 3. Degenerative changes as above. 4. Similar appearance of right lung  apex/superior mediastinal mass. Electronically signed by: Donnice Mania MD 12/23/2023 01:42 PM EDT RP Workstation: [id number]   CT HEAD WO CONTRAST ( ) Result Date: 12/23/2023 EXAM: CT HEAD WITHOUT CONTRAST 12/23/2023 01:02:34 PM TECHNIQUE: CT of the head was performed without the administration of intravenous contrast. Automated exposure control, iterative reconstruction, and/or weight based adjustment of the mA/kV was utilized to reduce the radiation dose to as low as reasonably achievable. COMPARISON: CT head 12/24/2021. CLINICAL HISTORY: Fall on DAPT, hypertensive, eval ICH. Pt arrived via EMS from home due to a fall. EMS states that pt was sitting on the edge of the bed and fell and hit his head and has a skin tear on the left arm. Pt has a hematoma to the left eyebrow. EMS states that pt has a systolic BP of 240s which per home health is pt baseline. Pt is blind. Pt does not take any blood thinners. FINDINGS: BRAIN AND VENTRICLES: No acute hemorrhage. No evidence of acute infarct. Patchy periventricular and subcortical white matter low-density changes compatible with chronic microvascular ischemic change. Chronic bilateral cerebellar infarcts. Moderate diffuse cerebral volume loss. No hydrocephalus. No extra-axial collection. No mass effect or midline shift. Atherosclerotic calcifications involving the large vessels of the skull base. ORBITS: Bilateral lens replacement. SINUSES: Complete opacification of the left frontal sinus, slightly increased from prior. Scattered mucosal thickening in the ethmoid sinuses with near complete opacification of the left ethmoid sinus. Mucosal thickening in the visualized maxillary sinuses, slightly increased from prior. Complete opacification of the left sphenoid sinus with findings suggestive of chronic sinusitis. SOFT TISSUES AND SKULL: Left frontal scalp hematoma. Similar chronic postsurgical changes of the anterior frontal bones bilaterally. There is no evidence of  acute calvarial fracture. IMPRESSION: 1. No acute intracranial abnormality. 2. Left frontal scalp hematoma. 3. Chronic bilateral cerebellar infarcts. 4. Chronic sinusitis as above. Overall, increased opacification of the sinuses compared to prior. Electronically signed by: Donnice Mania MD 12/23/2023 01:33 PM EDT RP Workstation: [id number]    PROCEDURES and INTERVENTIONS:  .1-3 Lead EKG Interpretation  Performed by: Claudene Rover, MD Authorized by: Claudene Rover, MD     Interpretation: normal     ECG rate:  60   ECG rate assessment: normal     Rhythm: sinus rhythm     Ectopy: none     Conduction: normal     Medications  losartan  (COZAAR ) tablet 25 mg (has no administration in time range)  acetaminophen  (TYLENOL ) tablet 1,000 mg (1,000 mg Oral Given 12/23/23 1339)  iohexol  (OMNIPAQUE ) 300 MG/ML solution 80 mL (80 mLs Intravenous Contrast Given 12/23/23 1437)     IMPRESSION / MDM / ASSESSMENT AND PLAN / ED COURSE  I reviewed the triage vital signs and the nursing notes.  Differential diagnosis includes, but is not limited to, stroke, metabolic encephalopathy, ICH, skull fracture  {Patient presents with symptoms of an acute illness or injury that is potentially life-threatening.  Patient presents from home after a fall.  Multiple skin tears and a closed hematoma to his forehead but no larger deformity or laceration to require suture repair.  He has some right leg weakness that I suspect is more due to pain than anything, high suspicion for MSK pain.  Had a recent outpatient MRI L-spine without concerning features.  This blood work here is reassuring with a normal CBC, metabolic panel and troponin.  Urine without infectious features.  CT head and C-spine are reassuring as is x-ray of the pelvis and right hip.  Plain film of the chest questions a right hilar infiltrate so he is over in CT to further elucidate this.  Also pending MRI brain to assess for signs of stroke related to his  worsening falls and right leg weakness.  He is signed out to oncoming physician to follow-up on his radiographic studies and reassess the patient.  Patient may require admission pending clinical course  Clinical Course as of 12/23/23 1501  Thu Dec 23, 2023  1229 [facility] has been with him for 3 years [DS]  1449 Went to reassess, he is over in CT getting CT chest.  I discussed with his daughter who has now arrived.  We discussed workup so far, her concerns, plan of care.  She confirms that it has been greater than 1 month since he has had any antibiotics and this was for pneumonia 1-2 months ago. [DS]    Clinical Course User Index [DS] Claudene Rover, MD     FINAL CLINICAL IMPRESSION(S) / ED DIAGNOSES   Final diagnoses:  None     Rx / DC Orders   ED Discharge Orders     None        Note:  This document was prepared using Dragon voice recognition software and may include unintentional dictation errors.   Claudene Rover, MD 12/23/23 [phone]

## 2023-12-23 NOTE — ED Notes (Signed)
 Pt daughter is refusing collar for pt. Daughter sts that she feels like pt doesn't have long to live and she doesn't want him to be uncomfortable.

## 2023-12-24 DIAGNOSIS — I509 Heart failure, unspecified: Secondary | ICD-10-CM | POA: Diagnosis present

## 2023-12-24 DIAGNOSIS — Z85828 Personal history of other malignant neoplasm of skin: Secondary | ICD-10-CM | POA: Diagnosis not present

## 2023-12-24 DIAGNOSIS — Z7189 Other specified counseling: Secondary | ICD-10-CM

## 2023-12-24 DIAGNOSIS — Z66 Do not resuscitate: Secondary | ICD-10-CM | POA: Diagnosis present

## 2023-12-24 DIAGNOSIS — S12101A Unspecified nondisplaced fracture of second cervical vertebra, initial encounter for closed fracture: Secondary | ICD-10-CM | POA: Diagnosis not present

## 2023-12-24 DIAGNOSIS — I739 Peripheral vascular disease, unspecified: Secondary | ICD-10-CM | POA: Diagnosis present

## 2023-12-24 DIAGNOSIS — Z79890 Hormone replacement therapy (postmenopausal): Secondary | ICD-10-CM | POA: Diagnosis not present

## 2023-12-24 DIAGNOSIS — J9601 Acute respiratory failure with hypoxia: Secondary | ICD-10-CM | POA: Diagnosis present

## 2023-12-24 DIAGNOSIS — R531 Weakness: Secondary | ICD-10-CM | POA: Diagnosis present

## 2023-12-24 DIAGNOSIS — I161 Hypertensive emergency: Secondary | ICD-10-CM | POA: Diagnosis present

## 2023-12-24 DIAGNOSIS — E785 Hyperlipidemia, unspecified: Secondary | ICD-10-CM | POA: Diagnosis present

## 2023-12-24 DIAGNOSIS — S12100A Unspecified displaced fracture of second cervical vertebra, initial encounter for closed fracture: Secondary | ICD-10-CM | POA: Diagnosis present

## 2023-12-24 DIAGNOSIS — F0390 Unspecified dementia without behavioral disturbance: Secondary | ICD-10-CM | POA: Diagnosis present

## 2023-12-24 DIAGNOSIS — Z7901 Long term (current) use of anticoagulants: Secondary | ICD-10-CM | POA: Diagnosis not present

## 2023-12-24 DIAGNOSIS — Z7982 Long term (current) use of aspirin: Secondary | ICD-10-CM | POA: Diagnosis not present

## 2023-12-24 DIAGNOSIS — W010XXA Fall on same level from slipping, tripping and stumbling without subsequent striking against object, initial encounter: Secondary | ICD-10-CM | POA: Diagnosis present

## 2023-12-24 DIAGNOSIS — C3491 Malignant neoplasm of unspecified part of right bronchus or lung: Secondary | ICD-10-CM | POA: Diagnosis not present

## 2023-12-24 DIAGNOSIS — C781 Secondary malignant neoplasm of mediastinum: Secondary | ICD-10-CM | POA: Diagnosis present

## 2023-12-24 DIAGNOSIS — R296 Repeated falls: Secondary | ICD-10-CM | POA: Diagnosis present

## 2023-12-24 DIAGNOSIS — Y92009 Unspecified place in unspecified non-institutional (private) residence as the place of occurrence of the external cause: Secondary | ICD-10-CM | POA: Diagnosis not present

## 2023-12-24 DIAGNOSIS — C7951 Secondary malignant neoplasm of bone: Secondary | ICD-10-CM | POA: Diagnosis present

## 2023-12-24 DIAGNOSIS — Z515 Encounter for palliative care: Secondary | ICD-10-CM | POA: Diagnosis not present

## 2023-12-24 DIAGNOSIS — I130 Hypertensive heart and chronic kidney disease with heart failure and stage 1 through stage 4 chronic kidney disease, or unspecified chronic kidney disease: Secondary | ICD-10-CM | POA: Diagnosis present

## 2023-12-24 DIAGNOSIS — N1831 Chronic kidney disease, stage 3a: Secondary | ICD-10-CM | POA: Diagnosis present

## 2023-12-24 DIAGNOSIS — Z7984 Long term (current) use of oral hypoglycemic drugs: Secondary | ICD-10-CM | POA: Diagnosis not present

## 2023-12-24 DIAGNOSIS — I2510 Atherosclerotic heart disease of native coronary artery without angina pectoris: Secondary | ICD-10-CM | POA: Diagnosis present

## 2023-12-24 DIAGNOSIS — C3431 Malignant neoplasm of lower lobe, right bronchus or lung: Secondary | ICD-10-CM | POA: Diagnosis present

## 2023-12-24 DIAGNOSIS — S0083XA Contusion of other part of head, initial encounter: Secondary | ICD-10-CM

## 2023-12-24 DIAGNOSIS — J449 Chronic obstructive pulmonary disease, unspecified: Secondary | ICD-10-CM | POA: Diagnosis present

## 2023-12-24 DIAGNOSIS — Z7902 Long term (current) use of antithrombotics/antiplatelets: Secondary | ICD-10-CM | POA: Diagnosis not present

## 2023-12-24 MED ORDER — HYOSCYAMINE SULFATE 0.125 MG SL SUBL: 0.1250 mg | SUBLINGUAL_TABLET | SUBLINGUAL | 0 refills | Status: DC | PRN

## 2023-12-24 MED ORDER — LORAZEPAM 0.5 MG PO TABS: 0.5000 mg | ORAL_TABLET | Freq: Once | ORAL | 0 refills | Status: DC | PRN

## 2023-12-24 MED ORDER — ONDANSETRON HCL 4 MG PO TABS: 4.0000 mg | ORAL_TABLET | Freq: Four times a day (QID) | ORAL | 0 refills | Status: DC | PRN

## 2023-12-24 MED ORDER — DOXYCYCLINE HYCLATE 100 MG PO TABS: 100.0000 mg | ORAL_TABLET | Freq: Two times a day (BID) | ORAL | 0 refills | Status: DC

## 2023-12-24 MED ORDER — MORPHINE SULFATE (CONCENTRATE) 20 MG/ML PO SOLN: 5.0000 mg | ORAL | 0 refills | Status: DC | PRN

## 2023-12-24 MED ORDER — ACETAMINOPHEN 325 MG PO TABS: 650.0000 mg | ORAL_TABLET | Freq: Four times a day (QID) | ORAL | 1 refills | Status: DC | PRN

## 2023-12-24 MED ORDER — MORPHINE SULFATE (CONCENTRATE) 10 MG /0.5 ML PO SOLN: 5.0000 mg | ORAL | 0 refills | Status: DC | PRN

## 2023-12-24 MED ORDER — DEXAMETHASONE 1 MG PO TABS: 1.0000 mg | ORAL_TABLET | Freq: Every day | ORAL | 0 refills | Status: DC

## 2023-12-24 MED ORDER — BISACODYL 5 MG PO TBEC: 5.0000 mg | DELAYED_RELEASE_TABLET | Freq: Every day | ORAL | 0 refills | Status: DC | PRN

## 2023-12-24 MED ORDER — DEXAMETHASONE 0.5 MG PO TABS
1.0000 mg | ORAL_TABLET | Freq: Every day | ORAL | Status: DC
  Administered 2023-12-24: 1 mg via ORAL
  Filled 2023-12-24: qty 2

## 2023-12-24 MED ORDER — SENNOSIDES-DOCUSATE SODIUM 8.6-50 MG PO TABS: 1.0000 | ORAL_TABLET | Freq: Every evening | ORAL | 1 refills | Status: DC | PRN

## 2023-12-24 MED ORDER — MORPHINE SULFATE (PF) 2 MG/ML IV SOLN
2.0000 mg | INTRAVENOUS | Status: DC | PRN
  Administered 2023-12-24 (×3): 2 mg via INTRAVENOUS
  Filled 2023-12-24 (×3): qty 1

## 2023-12-24 MED ORDER — MORPHINE SULFATE (CONCENTRATE) 10 MG /0.5 ML PO SOLN
5.0000 mg | ORAL | Status: DC | PRN
  Administered 2023-12-24: 10 mg via ORAL
  Filled 2023-12-24: qty 0.5

## 2023-12-24 NOTE — Progress Notes (Signed)
 Patient's pain uncontrolled all night. Patient received 2mg  of morphine  which seemed to help him a little bit. Patient and caregiver requested 5mg  of oxy to add onto the pain regimen to see if it helped ease the patients pain even more. At 0603, patients caregiver called writers phone to inform her that the patient was restless, pulling the blankets off, rocking side to side and holding his head. Caregiver stated that he keeps stating that he has to pee but will not pee. Patient has been peeing with no problem this entire shift. Writer and nurse tech obtained a bladder scan on the patient which only showed in patients bladder. Patient's caregiver stated that she doesn't know if he is acting like this because of his head pain or if he is delirious from the medication. Caregiver and patient asked writer to pass on not to give the patient anymore oxy. They only want Morphine  and stated the dilaudid  did not touch his pain and the oxy makes him coocoo.

## 2023-12-24 NOTE — Consult Note (Signed)
 Palliative Medicine [facility] at [facility] Telephone:([phone]) [phone] Fax:(336) [phone]   Name: Steve Bryant Date: 12/24/2023 MRN: [id number]  DOB: August 19, 1925  Patient Care Team: Glover Lenis, MD as PCP - General (Family Medicine)    REASON FOR CONSULTATION: Steve Bryant is a 89 y.o. male with multiple medical problems including hypertension, hyperlipidemia, COPD, and history of CVA, AAA status postrepair, CKD stage IIIa, SSS, blindness, history of lumbar spine radiculopathy, who was admitted the hospital on 12/23/2023 after a fall.  Patient underwent CT of the chest with findings of a right lower lobe mass, hilar mediastinal adenopathy, and lytic lesions to the ribs, T5, and L1.  Palliative care was consulted to address goals and manage ongoing symptoms.  SOCIAL HISTORY:     reports that he quit smoking about 46 years ago. His smoking use included cigarettes. He started smoking about 71 years ago. He has a 25 pack-year smoking history. He has never used smokeless tobacco. He reports that he does not currently use alcohol. He reports that he does not use drugs.  Patient is a widower.  Lives at home with 24/7 caregivers.  Has a daughter and 2 sons who are involved.  Patient owned several car dealerships.  ADVANCE DIRECTIVES:  Daughter is reportedly his healthcare power of attorney.  CODE STATUS: DNR  PAST MEDICAL HISTORY: Past Medical History:  Diagnosis Date   AAA (abdominal aortic aneurysm)    a.) Abd US  12/22/16: measured 4.5 cm. b.) CTA 12/27/18: fusiform infrarenal AAA measured 4.7 cm. c.) Abd US  07/04/19: measured 5.0 cm. d.) CTA 11/03/19: measured 4.9 cm. e.) CTA 10/21/20: interval increase in size to 5.4 x 5.0 cm extending to the iliac bifurcation.   Acute ischemic stroke (HCC) 10/16/2016   a.) MRI --> 13 x 14 mm ischemic, non-hemorrhagic, RIGHT paramedian ventral pons territory   Aortic atherosclerosis    Arthritis    Basal cell  carcinoma 06/25/2022   R preauricular, EDC done   BCC (basal cell carcinoma) 04/16/2022   right cheek, tx with [facility] 06/25/22   BCC (basal cell carcinoma) 04/16/2022   left prox pretibia, tx'd with EDC   Bilateral renal cysts    Bradycardia    Carotid artery disease    a.) Doppler 07/02/09: RIGHT 50-75%, LEFT < 50%. b.) CTA 07/04/09: RIGHT 90%. LEFT 50-75%. c.) CTA 10/15/2016: RIGHT with no significant stenosis, LEFT 60%.   CHF (congestive heart failure) (HCC)    Chronic anticoagulation    a.) DAPT therapy (ASA + clopidogrel )   Coronary artery disease    DDD (degenerative disc disease), lumbar    Diastolic dysfunction 04/16/2020   a.) TTE 04/16/20: EF 60-65%; G1DD.   DOE (dyspnea on exertion)    Dyspnea    GERD (gastroesophageal reflux disease)    Glaucoma    Hyperlipidemia    Hypertension    Legally blind    Neck mass    a.) CT 07/04/09: 4.9 cm supraclavicular mass just posterior to clavicle; PET CT 07/05/09 showed mass to be FDG avid with max SUV of 6. b.) CT 10/15/16: measured 4 x 5 cm. c.) CT 04/15/20: measured 5.1 x 3.7 cm.   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 WD SCC. EDC   Squamous cell carcinoma of skin 06/15/2019   Right calf. KA-type. EDC   Squamous cell carcinoma of skin 11/13/2020   Right distal hand (in situ) - EDC   Squamous cell carcinoma of skin 11/13/2020   Right prox hand (in situ) - EDC   TIA (transient ischemic attack) 04/15/2020   Valvular regurgitation    a.) TTE 10/17/2016 : EF 55-60%; mild AR/MR. b.) TTE 04/16/2020: EF 60-65%; G1DD, mild AR, triv. MR. c.) TTE 12/09/20: EF >55%; mild AR/TR, triv. PR, mod. MR.    PAST SURGICAL HISTORY:  Past Surgical  History:  Procedure Laterality Date   CERVICAL LAMINECTOMY N/A 05/2006   Fusion C3-C7   EMBOLIZATION (CATH LAB) N/A 10/29/2022   Procedure: EMBOLIZATION;  Surgeon: Marea Selinda RAMAN, MD;  Location: [facility] INVASIVE CV LAB;  Service: Cardiovascular;  Laterality: N/A;   ENDOVASCULAR STENT GRAFT (AAA) N/A 12/25/2020   Procedure: ENDOVASCULAR REPAIR/STENT GRAFT;  Surgeon: Marea Selinda RAMAN, MD;  Location: [facility] INVASIVE CV LAB;  Service: Cardiovascular;  Laterality: N/A;   GLAUCOMA SURGERY  2003   INGUINAL HERNIA REPAIR Right 2007   TUMOR REMOVAL Right 1977   Thoracotomy with RUL pulmonary mass resection    HEMATOLOGY/ONCOLOGY HISTORY:  Oncology History   No history exists.    ALLERGIES:  is allergic to shellfish protein-containing drug products.  MEDICATIONS:  Current Facility-Administered Medications  Medication Dose Route Frequency Provider Last Rate Last Admin   acetaminophen  (TYLENOL ) tablet 650 mg  650 mg Oral Q6H PRN Laurita Manor T, MD   650 mg at 12/24/23 9381   Or   acetaminophen  (TYLENOL ) suppository 650 mg  650 mg Rectal Q6H PRN Laurita Manor DASEN, MD       aspirin  EC tablet 81 mg  81 mg Oral Daily Laurita Manor T, MD       benzonatate (TESSALON) capsule 100 mg  100 mg Oral TID PRN Laurita Manor DASEN, MD       bisacodyl (DULCOLAX) EC tablet 5 mg  5 mg Oral Daily PRN Laurita Manor T, MD       clopidogrel  (PLAVIX ) tablet 75 mg  75 mg Oral Daily Laurita Manor T, MD       dexamethasone (DECADRON) tablet 1 mg  1 mg Oral Daily Jud Fanguy R, NP       dorzolamide  (TRUSOPT ) 2 % ophthalmic solution 1 drop  1 drop Right Eye Q lunch Laurita Manor T, MD       doxycycline (VIBRA-TABS) tablet 100 mg  100 mg Oral Q12H Zhang, Ping T, MD   100 mg at 12/23/23 2128   enoxaparin  (LOVENOX ) injection 40 mg  40 mg Subcutaneous Q24H Laurita Manor T, MD   40 mg at 12/23/23 2129   furosemide (LASIX) tablet 20 mg  20 mg Oral Daily Laurita Manor T, MD       guaiFENesin  (MUCINEX ) 12 hr tablet 1,200 mg  1,200 mg Oral BID Laurita Manor T,  MD   1,200 mg at 12/23/23 2129   hydrALAZINE  (APRESOLINE ) injection 5 mg  5 mg Intravenous Q6H PRN Laurita Manor T, MD       latanoprost  (XALATAN ) 0.005 % ophthalmic solution 1 drop  1 drop Right Eye QHS Laurita Manor T, MD   1 drop at 12/23/23 2130   levothyroxine  (SYNTHROID ) tablet 50 mcg  50 mcg Oral Daily Laurita Manor DASEN, MD   50 mcg at 12/24/23 0529  LORazepam (ATIVAN) tablet 0.5 mg  0.5 mg Oral Once PRN Laurita Cort DASEN, MD       losartan  (COZAAR ) tablet 25 mg  25 mg Oral Daily Laurita Cort T, MD       morphine  (PF) 2 MG/ML injection 2 mg  2 mg Intravenous Q4H PRN Mansy, Jan A, MD   2 mg at 12/24/23 9172   morphine  CONCENTRATE 10 mg / 0.5 ml oral solution 5-10 mg  5-10 mg Oral Q2H PRN Raihan Kimmel R, NP       ondansetron  (ZOFRAN ) tablet 4 mg  4 mg Oral Q6H PRN Laurita Cort DASEN, MD       Or   ondansetron  (ZOFRAN ) injection 4 mg  4 mg Intravenous Q6H PRN Laurita Cort DASEN, MD       pantoprazole  (PROTONIX ) EC tablet 20 mg  20 mg Oral Daily Laurita Cort T, MD       QUEtiapine  (SEROQUEL ) tablet 25 mg  25 mg Oral BID Laurita Cort T, MD   25 mg at 12/23/23 2129   senna-docusate (Senokot-S) tablet 1 tablet  1 tablet Oral QHS PRN Laurita Cort DASEN, MD       spironolactone (ALDACTONE) tablet 12.5 mg  12.5 mg Oral Daily Laurita Cort T, MD        VITAL SIGNS: BP (!) 146/90   Pulse 78   Temp 98.3 F (36.8 C)   Resp 17   Ht 5' 8 (1.727 m)   Wt 160 lb 3.2 oz (72.7 kg)   SpO2 91%   BMI 24.36 kg/m  Filed Weights   12/23/23 1216  Weight: 160 lb 3.2 oz (72.7 kg)    Estimated body mass index is 24.36 kg/m as calculated from the following:   Height as of this encounter: 5' 8 (1.727 m).   Weight as of this encounter: 160 lb 3.2 oz (72.7 kg).  LABS: CBC:    Component Value Date/Time   WBC 9.3 12/23/2023 1224   HGB 14.8 12/23/2023 1224   HCT 46.0 12/23/2023 1224   PLT 223 12/23/2023 1224   MCV 85.0 12/23/2023 1224   NEUTROABS 5.9 12/23/2023 1224   LYMPHSABS 1.9 12/23/2023 1224   MONOABS 0.9 12/23/2023  1224   EOSABS 0.5 12/23/2023 1224   BASOSABS 0.1 12/23/2023 1224   Comprehensive Metabolic Panel:    Component Value Date/Time   NA 141 12/23/2023 1224   K 4.2 12/23/2023 1224   CL 104 12/23/2023 1224   CO2 24 12/23/2023 1224   BUN 22 12/23/2023 1224   CREATININE 1.08 12/23/2023 1224   GLUCOSE 104 (H) 12/23/2023 1224   CALCIUM  9.5 12/23/2023 1224   AST 30 12/23/2023 1224   ALT 10 12/23/2023 1224   ALKPHOS 86 12/23/2023 1224   BILITOT 1.5 (H) 12/23/2023 1224   PROT 7.1 12/23/2023 1224   ALBUMIN 3.5 12/23/2023 1224    RADIOGRAPHIC STUDIES: MR CERVICAL SPINE WO CONTRAST Result Date: 12/23/2023 EXAM: MRI CERVICAL SPINE WITHOUT CONTRAST 12/23/2023 06:42:59 PM TECHNIQUE: Multiplanar multisequence MRI of the cervical spine was performed. COMPARISON: Comparison with prior CT from earlier the same day. CLINICAL HISTORY: Neck trauma (Age >= 65y). Steve Bryant is a 89 y.o. male with medical history significant of HTN, HLD, COPD, stroke, GERD, AAA s/p repair, CKD stage IIIa, SSS, bilateral blindness, lumbar spine radiculopathy, presented with worsening of bilateral lower extremity weakness ; and fall. FINDINGS: BONES AND ALIGNMENT: Straightening of the normal cervical lordosis. Trace facet mediastinum and anterolisthesis of C7 on T1.  Vertebral body heights are maintained. No abnormal marrow edema seen about the previously noted lucency at the osteophyte arising from the inferior endplate of C2. This is felt to be consistent with a chronic finding. No other MRI evidence for acute traumatic injury within the cervical spine. A few subcentimeter T1 hypointense lesions seen within the T2 and T3 vertebral bodies (series 8, images 9-11) although nonspecific, findings could potentially reflect small osseous metastases given findings on prior chest CT. No other definite worrisome osseous lesions. Prior ACDF at C3 through C7. Solid arthrodesis at C4 through C7. SPINAL CORD: Normal spinal cord size. No  abnormal spinal cord signal. SOFT TISSUES: No paraspinal mass. C2-C3: Degenerative vertebral disc space narrowing with diffuse disc bulge and uncovertebral spurring. Moderate facet hypertrophy. Resultant mild spinal stenosis. Foramina appear patent. C3-C4: Prior fusion. No residual spinal stenosis. Uncovertebral spurring with moderate bilateral C4 foraminal narrowing. C4-C5: Prior fusion. No residual spinal stenosis. Right sided uncovertebral spurring with mild right C5 foraminal stenosis. Left neural foraminal patent. C5-C6: Prior fusion. No residual spinal stenosis. Left sided uncovertebral spurring with residual moderate left C6 foraminal narrowing. Right neural foraminal appears patent. C6-C7: Prior fusion. No significant residual spinal stenosis. Right sided uncovertebral spurring with residual moderate right C7 foraminal stenosis. Left neural foraminal appearance patent. C7-T1: Degenerative vertebral disc space narrowing with disc desiccation endplate spurring without significant disc bulge. No significant canal or foraminal stenosis. INTRACRANIAL FINDINGS: Changes of chronic microvascular ischemic disease with multiple remote lacunar infarcts noted about the pons. Chronic bilateral cerebellar infarcts partially visualized. MEDIASTINAL FINDINGS: Heterogeneous right upper mediastinum adenopathy measuring up to 5.2 cm concerning for nodal metastatic disease. IMPRESSION: 1. No evidence of acute traumatic injury within the cervical spine. Previously noted lucency extending through an osteophyte at C2 is chronic in appearance by MRI. 2. Subcentimeter T1 hypointense lesions involving the T2 and T3 vertebral bodies as above, nonspecific, but suspicious for possible small osseous metastases given findings on corresponding chest CT. 3. Heterogeneous right upper mediastinum adenopathy measuring up to 5.2 cm, concerning for nodal metastatic disease. 4. Prior fusion at C3 through c7 without significant spinal stenosis. 5.  Adjacent segment disease at C2-3 with resultant mild spinal stenosis. Mild to moderate multilevel foraminal narrowing as above. Electronically signed by: Morene Hoard MD 12/23/2023 07:30 PM EDT RP Workstation: [id number]   MR BRAIN WO CONTRAST Result Date: 12/23/2023 CLINICAL DATA:  Stroke.  Acute right leg weakness.  Fall. EXAM: MRI HEAD WITHOUT CONTRAST TECHNIQUE: Multiplanar, multiecho pulse sequences of the brain and surrounding structures were obtained without intravenous contrast. COMPARISON:  Head CT 12/23/2023 and MRI 04/15/2020 FINDINGS: Brain: There is no evidence of an acute infarct, mass, midline shift, or extra-axial fluid collection. Chronic microhemorrhages are again seen in the cerebellum and brainstem as well as a few supratentorial chronic microhemorrhages including in the right thalamus and basal ganglia, potentially secondary to chronic hypertension. There is moderately advanced cerebral atrophy. Patchy to confluent T2 hyperintensities in the cerebral white matter bilaterally and in the pons are stable to slightly increased from the prior MRI and are nonspecific but compatible with severe chronic small vessel ischemic disease. Chronic infarcts are again noted in the deep gray nuclei, pons, and cerebellum. Vascular: Major intracranial vascular flow voids are preserved. Skull and upper cervical spine: Unremarkable bone marrow signal. Partially visualized anterior cervical spine fusion. Sinuses/Orbits: Bilateral cataract extraction. Mild-to-moderate mucosal thickening in the paranasal sinuses. Clear mastoid air cells. Other: Left forehead scalp hematoma. IMPRESSION: 1. No acute intracranial abnormality. 2. Severe  chronic small vessel ischemic disease. Electronically Signed   By: Dasie Hamburg M.D.   On: 12/23/2023 16:44   CT Chest W Contrast Result Date: 12/23/2023 CLINICAL DATA:  Fall.  Abnormal hilar prominence. * Tracking Code: BO * EXAM: CT CHEST WITH CONTRAST TECHNIQUE:  Multidetector CT imaging of the chest was performed during intravenous contrast administration. RADIATION DOSE REDUCTION: This exam was performed according to the departmental dose-optimization program which includes automated exposure control, adjustment of the mA and/or kV according to patient size and/or use of iterative reconstruction technique. CONTRAST:  80mL OMNIPAQUE  IOHEXOL  300 MG/ML  SOLN COMPARISON:  Chest radiograph 12/23/2023 FINDINGS: Cardiovascular: Right hilar adenopathy exerts some extrinsic mass effect on right pulmonary vascular structures. Coronary, aortic arch, and branch vessel atherosclerotic vascular disease. Prominent coronary sinus 4.0 cm in diameter. Partially thrombosed descending thoracic aortic aneurysm 4.1 cm in diameter. Mediastinum/Nodes: Right upper mediastinal mass 5.5 by 3.9 by 4.3 cm, internal heterogeneous density, previously 5.4 by 3.9 cm on 04/15/2020. AP window lymph node 1.1 cm in short axis on image 59 series 2. Substantial right hilar and suprahilar adenopathy including a right hilar node measuring 2.0 cm in short axis on image 78 series 2. Right subcarinal node 1.5 cm in short axis, image 80 series 2. Lungs/Pleura: Superior segment right lower lobe mass 6.2 by 3.7 cm on image 82 series 2. Prior wedge resection of part of the right upper lobe. Postobstructive pneumonitis or pneumonia in the right lower lobe. Subsolid right lower lobe nodule 1.3 by 0.8 cm on image 98 series 4. Honeycombing at the lung bases. Clustered nodularity medially in the superior segment left lower lobe on images 62 through 85 of series 4, infectious etiology favored. Calcified pleural plaques at the right lung apex. 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 The partially thrombosed descending thoracic aortic  aneurysm extends into the suprarenal abdominal aorta. Notable atheromatous plaque in the abdominal aorta. Narrowed but not occluded celiac trunk likely due to median arcuate ligament. Narrowed proximal SMA due to atheromatous plaque. Punctate calcifications in the spleen favoring old granulomatous disease. Musculoskeletal: Right second, third, and fourth rib deformities which may be postoperative. Lytic lesion of the right sixth rib measuring 1.8 by 1.2 cm on image 84 series 4. Small lytic lesion of the left third rib anteriorly. 2.0 cm lytic lesion of the anterior T5 vertebral body on image 116 series 6. 1.1 cm lytic lesion of the anterior T6 vertebral body. Schmorl's node along the inferior endplate of T12. Schmorl's node versus lytic lesion along the right superior endplate of L1. Lower cervical plate and screw fixator. IMPRESSION: 1. Superior segment right lower lobe mass 6.2 by 3.7 cm, suspicious for malignancy. 2. Right hilar and mediastinal adenopathy, suspicious for malignancy. 3. Lytic lesions of the right sixth rib, left third rib, T5 vertebral body, and possibly the L1 vertebral body, suspicious for metastatic disease. 4. Subsolid right lower lobe nodule 1.3 by 0.8 cm. 5. Clustered nodularity medially in the superior segment left lower lobe, infectious etiology favored, but surveillance suggested. 6. Porcelain gallbladder versus very large gallstone filling the gallbladder. 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 7. Partially thrombosed descending thoracic aortic aneurysm 4.1 cm in diameter. This extends into a suprarenal abdominal aortic aneurysm measuring 4.1 cm. This has been followed with CT angiography,  consider annual CTA of the chest, abdomen, and pelvis. 8. Narrowed but not occluded celiac trunk likely due to median arcuate ligament. Narrowed proximal SMA due to atheromatous plaque. 9. Honeycombing at  the lung bases compatible with interstitial lung disease. 10.  Aortic Atherosclerosis (ICD10-I70.0). Electronically Signed   By: Ryan Salvage M.D.   On: 12/23/2023 15:10   DG Chest Portable 1 View Result Date: 12/23/2023 EXAM: 1 VIEW(S) XRAY OF THE CHEST 12/23/2023 01:28:00 PM COMPARISON: Comparison 07/10/2021. CLINICAL HISTORY: fall. Pt arrived via EMS from home due to a fall. EMS sts that pt was sitting on the edge of the bed and fell and hit his head and has a skin tear on the left arm. Pt has a hematoma to the left eyebrow. EMS sts that pt has a systolic BP of 240's which per ; home health is pt baseline. Pt is blind. FINDINGS: LUNGS AND PLEURA: Postsurgical changes are seen in right upper lobe. Right hilar prominence is noted concerning for possible mass or adenopathy. CT scan of the chest with intravenous contrast is recommended for further evaluation. Calcified pleural plaques are seen in left lung apex. No focal pulmonary opacity. No pulmonary edema. No pleural effusion. No pneumothorax. HEART AND MEDIASTINUM: Aortic atherosclerosis. No acute abnormality of the cardiac and mediastinal silhouettes. BONES AND SOFT TISSUES: No acute osseous abnormality. IMPRESSION: 1. Right hilar prominence, concerning for a mass or adenopathy; recommend contrast-enhanced chest CT for further evaluation. 2. Postsurgical changes in the right upper lobe. 3. Calcified pleural plaques in the left lung apex. 4. Aortic atherosclerosis. Electronically signed by: Lynwood Seip MD 12/23/2023 02:01 PM EDT RP Workstation: [id number]   DG Hip Unilat W or Wo Pelvis 2-3 Views Right Result Date: 12/23/2023 EXAM: 2 or 3 VIEW(S) XRAY OF THE RIGHT HIP 12/23/2023 01:28:00 PM COMPARISON: None available. CLINICAL HISTORY: fall. EMS sts that pt was sitting on the edge of the bed and fell and hit his head and has a skin tear on the left arm. FINDINGS: BONES AND JOINTS: No acute fracture or focal osseous lesion. The hip joint is  maintained. No significant degenerative changes. SOFT TISSUES: The soft tissues are unremarkable. IMPRESSION: 1. No significant abnormality in the right hip or visualized pelvis. Electronically signed by: Lynwood Seip MD 12/23/2023 01:59 PM EDT RP Workstation: [id number]   CT Cervical Spine Wo Contrast Result Date: 12/23/2023 EXAM: CT CERVICAL SPINE WITHOUT CONTRAST 12/23/2023 01:02:34 PM TECHNIQUE: CT of the cervical spine was performed without the administration of intravenous contrast. Multiplanar reformatted images are provided for review. Automated exposure control, iterative reconstruction, and/or weight based adjustment of the mA/kV was utilized to reduce the radiation dose to as low as reasonably achievable. COMPARISON: CT cervical spine 321 22. CLINICAL HISTORY: Fall. Pt arrived via EMS from home due to a fall. EMS states that pt was sitting on the edge of the bed and fell and hit his head and has a skin tear on the left arm. Pt has a hematoma to the left eyebrow. EMS states that pt has a systolic BP of 240's which per home health is pt baseline. Pt is blind. Pt does not take any blood thinners. FINDINGS: CERVICAL SPINE: BONES AND ALIGNMENT: Anterior cervical fusion hardware spanning C3-C7. Hardware is intact. There is solid osseous fusion from C4-C7. No definite solid osseous fusion across the C3-C4 level. No traumatic malalignment. Vertebral body heights are maintained. There is a vertically oriented lucency through the osteophytes along the C2 inferior endplate which is new from prior  concerning for fractured osteophyte. Chronic deformity of the right 2nd rib. DEGENERATIVE CHANGES: Facet arthrosis and uncovertebral hypertrophy at multiple levels throughout the cervical spine. Disc osteophyte complexes at multiple levels. Mild spinal canal stenosis at C3-C4. There is at least moderate foraminal stenosis at multiple levels. SOFT TISSUES: No prevertebral soft tissue swelling. Similar appearance of a  partially visualized hypoattenuating mass at the right lung apex/superior mediastinum which measures up to 5.3 cm. Bilateral pleural calcifications. IMPRESSION: 1. Vertically oriented lucency through osteophytes at the C2 inferior endplate, concerning for fractured osteophyte. Consider MRI for further evaluation. 2. Anterior cervical fusion hardware spanning C3C7. 3. Degenerative changes as above. 4. Similar appearance of right lung apex/superior mediastinal mass. Electronically signed by: Donnice Mania MD 12/23/2023 01:42 PM EDT RP Workstation: [id number]   CT HEAD WO CONTRAST ( ) Result Date: 12/23/2023 EXAM: CT HEAD WITHOUT CONTRAST 12/23/2023 01:02:34 PM TECHNIQUE: CT of the head was performed without the administration of intravenous contrast. Automated exposure control, iterative reconstruction, and/or weight based adjustment of the mA/kV was utilized to reduce the radiation dose to as low as reasonably achievable. COMPARISON: CT head 12/24/2021. CLINICAL HISTORY: Fall on DAPT, hypertensive, eval ICH. Pt arrived via EMS from home due to a fall. EMS states that pt was sitting on the edge of the bed and fell and hit his head and has a skin tear on the left arm. Pt has a hematoma to the left eyebrow. EMS states that pt has a systolic BP of 240s which per home health is pt baseline. Pt is blind. Pt does not take any blood thinners. FINDINGS: BRAIN AND VENTRICLES: No acute hemorrhage. No evidence of acute infarct. Patchy periventricular and subcortical white matter low-density changes compatible with chronic microvascular ischemic change. Chronic bilateral cerebellar infarcts. Moderate diffuse cerebral volume loss. No hydrocephalus. No extra-axial collection. No mass effect or midline shift. Atherosclerotic calcifications involving the large vessels of the skull base. ORBITS: Bilateral lens replacement. SINUSES: Complete opacification of the left frontal sinus, slightly increased from prior. Scattered mucosal  thickening in the ethmoid sinuses with near complete opacification of the left ethmoid sinus. Mucosal thickening in the visualized maxillary sinuses, slightly increased from prior. Complete opacification of the left sphenoid sinus with findings suggestive of chronic sinusitis. SOFT TISSUES AND SKULL: Left frontal scalp hematoma. Similar chronic postsurgical changes of the anterior frontal bones bilaterally. There is no evidence of acute calvarial fracture. IMPRESSION: 1. No acute intracranial abnormality. 2. Left frontal scalp hematoma. 3. Chronic bilateral cerebellar infarcts. 4. Chronic sinusitis as above. Overall, increased opacification of the sinuses compared to prior. Electronically signed by: Donnice Mania MD 12/23/2023 01:33 PM EDT RP Workstation: [id number]   VAS US  ABI WITH/WO TBI Result Date: 12/10/2023  LOWER EXTREMITY DOPPLER STUDY Patient Name:  Steve Bryant  Date of Exam:   12/07/2023 Medical Rec #: [id number]          Accession #:    [id number] Date of Birth: 07-25-1925         Patient Gender: M Patient Age:   24 years Exam Location:  [facility] Procedure:      VAS US  ABI WITH/WO TBI Referring Phys: --------------------------------------------------------------------------------  High Risk Factors: Hypertension, hyperlipidemia, past history of smoking,                    coronary artery disease, prior CVA.  Vascular Interventions: Coil embolization of right hypogastric artery for  endoleak 10/29/2022. Performing Technologist: Donnice Charnley RVT  Examination Guidelines: A complete evaluation includes at minimum, Doppler waveform signals and systolic blood pressure reading at the level of bilateral brachial, anterior tibial, and posterior tibial arteries, when vessel segments are accessible. Bilateral testing is considered an integral part of a complete examination. Photoelectric Plethysmograph (PPG) waveforms and toe systolic pressure readings are included as  required and additional duplex testing as needed. Limited examinations for reoccurring indications may be performed as noted.  ABI Findings: +---------+------------------+-----+---------+--------+ Right    Rt Pressure (mmHg)IndexWaveform Comment  +---------+------------------+-----+---------+--------+ Brachial 187                                      +---------+------------------+-----+---------+--------+ PTA                             biphasic          +---------+------------------+-----+---------+--------+ PERO     255               1.36 biphasic          +---------+------------------+-----+---------+--------+ DP       250               1.34 triphasic         +---------+------------------+-----+---------+--------+ Great Toe143               0.76                   +---------+------------------+-----+---------+--------+ +---------+------------------+-----+----------+-------+ Left     Lt Pressure (mmHg)IndexWaveform  Comment +---------+------------------+-----+----------+-------+ Brachial 182                                      +---------+------------------+-----+----------+-------+ PTA      186               0.99 biphasic          +---------+------------------+-----+----------+-------+ DP       250               1.34 monophasic        +---------+------------------+-----+----------+-------+ Great Toe93                0.50                   +---------+------------------+-----+----------+-------+ +-------+----------------+-----------+------------+------------+ ABI/TBIToday's ABI     Today's TBIPrevious ABIPrevious TBI +-------+----------------+-----------+------------+------------+ Right  Non compressible0.76                                +-------+----------------+-----------+------------+------------+ Left   Non compressible0.50                                +-------+----------------+-----------+------------+------------+    Summary: Right: Resting right ankle-brachial index indicates noncompressible right lower extremity arteries. The right toe-brachial index is normal.  Left: Resting left ankle-brachial index indicates noncompressible left lower extremity arteries. The left toe-brachial index is abnormal.  *See table(s) above for measurements and observations.  Electronically signed by Selinda Gu MD on 12/10/2023 at 9:39:00 AM.    Final     PERFORMANCE STATUS (ECOG) : 3 - Symptomatic, >50% confined to bed  Review of Systems Unless otherwise  noted, a complete review of systems is negative.  Physical Exam General: Thin, frail-appearing Pulmonary: Unlabored Extremities: no edema, no joint deformities Skin: Multiple bruises Neurological: Wakes with stimulation, some confusion  IMPRESSION: Patient admitted to the hospital after a fall and was found to have right lung mass with multiple lytic lesions.  Workup appears consistent with stage IV lung cancer.  Patient has some confusion and was unable to engage meaningfully in a conversation regarding goals.  I met with patient's caregiver and then spoke with his daughter by phone.  Daughter says that she is patient's healthcare power of attorney.  She spoke earlier with Dr. Melanee and says she is not interested in pursuing any further workup for presumed cancer.  She would not want to pursue cancer treatment.  Instead, she says family would like to take patient home and soon as possible and focus on his comfort and quality of life.  Daughter is interested in hospice and requests [facility] specifically. Will consult hospice liaison to coordinate home hospice.   Of note, patient lives at home but has 24/7 caregivers.  Family volunteered that they will hire more care if needed to keep patient at home.  Symptomatically, patient has significant bruising to the face from his recent fall.  He has history of spinal radiculopathy and now with multiple lytic lesions to the ribs, T5,  and possibly L1.  Overnight, patient has received hydromorphone , IV morphine , and oxycodone .  Family tell me the patient got very little sleep last night due to significant pain.  They feel that morphine  has been most effective.  Will rotate to oral morphine , which patient can take at home under hospice.  Will also start him on low-dose dexamethasone given probable inflammatory pain with bone lesions.  PLAN: - Best supportive care - Home with hospice (hospice liaison to coordinate) - Morphine  elixir 5 to 10 mg every 2 hours as needed - Start dexamethasone 1 mg daily - Daily bowel regimen - Follow-up as needed  Case and plan discussed with Dr. Melanee and Dr. Caleen  Time Total: 45 minutes  Visit consisted of counseling and education dealing with the complex and emotionally intense issues of symptom management and palliative care in the setting of serious and potentially life-threatening illness.Greater than 50%  of this time was spent counseling and coordinating care related to the above assessment and plan.  Signed by: Fonda Mower, PhD, NP-C

## 2023-12-24 NOTE — Progress Notes (Signed)
 Pt discharged to home via ambulance. Family/daughter at bedside. PIV removed.

## 2023-12-24 NOTE — Hospital Course (Addendum)
 Partly taken from H&P.  Steve Bryant is a 89 y.o. male with medical history significant of HTN, HLD, COPD, stroke, GERD, AAA s/p repair, CKD stage IIIa, SSS, bilateral blindness, lumbar spine radiculopathy, presented with worsening of bilateral lower extremity weakness and fall.  Patient was having worsening lower extremity weakness for the past few month, complaining of right-sided chest pain worsened with his cough.  Last month he was treated for LLL pneumonia.  Patient hit his head but denies any LOC.  Continued to have significant right-sided chest pain and right hip pain.  On presentation significantly elevated blood pressure at 213/100, brain MRI was negative for stroke.CT chest abdomen pelvis showed LLL mass likely lung cancer with metastatic hilar adenopathy and mediastinum metastasis, T5 and L1 metastatic lesions.  CT cervical spine showed suspected C2 endplate fracture.   Neurosurgery was consulted they recommend neck collar which patient declined and does not want it.  Cervical MRI was also ordered.  Palliative care was consulted due to poor prognosis based on age, significant underlying comorbidities and now with metastatic lung cancer and a cervical fracture.  Family would like to see an oncologist to explore options before making any other decision.  Oncology was consulted.  10/17: Vital stable, MRI of cervical spine with no evidence of acute traumatic injury, prior noted lucency seems chronic, multiple osseous abnormalities concerning for metastatic disease. Overnight agitation with pain medication.  Oncology recommended palliative care and hospice.  Family decided to transition him to comfort care and would like to take him back home with hospice help.  Hospice was consulted and patient is being discharged back home with hospice for end-of-life care.

## 2023-12-24 NOTE — TOC Transition Note (Signed)
 Transition of Care [facility]) - Discharge Note   Patient Details  Name: Steve Bryant MRN: [id number] Date of Birth: [birth year]  [facility] CM/SW Contact:  Lauraine JAYSON Carpen, LCSW Phone Number: 12/24/2023, 1:26 PM   Clinical Narrative:   Patient has orders to discharge home with hospice. Palliative NP provided choice and said [facility] is the preferred provider. Patient will need ambulance transport home to [address], [place], [place] [address]. [facility] has been arranged and they are here to transport. No further concerns. CSW signing off.  Final next level of care: Home w Hospice Care Barriers to Discharge: No Barriers Identified   Patient Goals and CMS Choice            Discharge Placement                Patient to be transferred to facility by: [facility] Name of family member notified: Darice Cheshire Patient and family notified of of transfer: 12/24/23  Discharge Plan and Services Additional resources added to the After Visit Summary for                                       Social Drivers of Health (SDOH) Interventions SDOH Screenings   Food Insecurity: No Food Insecurity (12/23/2023)  Housing: Patient Declined (12/23/2023)  Transportation Needs: Patient Declined (12/23/2023)  Utilities: Patient Declined (12/23/2023)  Financial Resource Strain: Low Risk  (06/03/2023)   Received from [facility]  Social Connections: Socially Isolated (12/23/2023)  Tobacco Use: Medium Risk (12/23/2023)     Readmission Risk Interventions     No data to display

## 2023-12-24 NOTE — Progress Notes (Addendum)
 Full note to follow.  Briefly patient admitted after a fall found to have lung mass and diffuse bone metastases concerning for metastatic lung cancer.  I have spoken to patient's daughter Steve Bryant who is his healthcare power of attorney.  She does not desire any further workup for his lung mass and wishes to proceed with home hospice.  I will have NP Fonda Mower from palliative care see him and help with transition of care.  Dr. Annah Skene, MD, MPH [facility] at [facility] [phone] 12/24/2023 8:29 AM

## 2023-12-24 NOTE — Care Management Obs Status (Signed)
 MEDICARE OBSERVATION STATUS NOTIFICATION   Patient Details  Name: Steve Bryant MRN: [id number] Date of Birth: [birth year]   Medicare Observation Status Notification Given:  Yes spoke with daughter, Darice Harm [phone]    [name] 12/24/2023, 10:15 AM

## 2023-12-24 NOTE — Progress Notes (Signed)
 PT Cancellation Note  Patient Details Name: Steve Bryant MRN: [id number] DOB: [birth year]   Cancelled Treatment:    Reason Eval/Treat Not Completed: Other (comment) Per chart, pt's POA electing to take pt home with hospice. Per medical team, no PT needs at this time. Orders completed.  [name], PT, DPT 12/24/23, 9:09 AM   [name] 12/24/2023, 9:08 AM

## 2023-12-24 NOTE — Plan of Care (Signed)

## 2023-12-24 NOTE — Discharge Summary (Addendum)
 Physician Discharge Summary   Patient: Steve Bryant MRN: [id number] DOB: 12/08/1925  Admit date:     12/23/2023  Discharge date: 12/24/23  Discharge Physician: Amaryllis Dare   PCP: Glover Lenis, MD   Recommendations at discharge:  Patient is being discharged home with hospice  Discharge Diagnoses: Principal Problem:   Closed cervical spine fracture [facility]) Active Problems:   C2 cervical fracture (HCC)   Metastatic lung cancer (metastasis from lung to other site), right [facility])   Palliative care encounter   Traumatic hematoma of forehead   Hospital Course: Partly taken from H&P.  Steve Bryant is a 89 y.o. male with medical history significant of HTN, HLD, COPD, stroke, GERD, AAA s/p repair, CKD stage IIIa, SSS, bilateral blindness, lumbar spine radiculopathy, presented with worsening of bilateral lower extremity weakness and fall.  Patient was having worsening lower extremity weakness for the past few month, complaining of right-sided chest pain worsened with his cough.  Last month he was treated for LLL pneumonia.  Patient hit his head but denies any LOC.  Continued to have significant right-sided chest pain and right hip pain.  On presentation significantly elevated blood pressure at 213/100, brain MRI was negative for stroke.CT chest abdomen pelvis showed LLL mass likely lung cancer with metastatic hilar adenopathy and mediastinum metastasis, T5 and L1 metastatic lesions.  CT cervical spine showed suspected C2 endplate fracture.   Neurosurgery was consulted they recommend neck collar which patient declined and does not want it.  Cervical MRI was also ordered.  Palliative care was consulted due to poor prognosis based on age, significant underlying comorbidities and now with metastatic lung cancer and a cervical fracture.  Family would like to see an oncologist to explore options before making any other decision.  Oncology was consulted.  10/17: Vital stable, MRI of  cervical spine with no evidence of acute traumatic injury, prior noted lucency seems chronic, multiple osseous abnormalities concerning for metastatic disease. Overnight agitation with pain medication.  Oncology recommended palliative care and hospice.  Family decided to transition him to comfort care and would like to take him back home with hospice help.  Hospice was consulted and patient is being discharged back home with hospice for end-of-life care.   Consultants: Oncology.  Palliative care Procedures performed: None Disposition: Hospice care Diet recommendation:  Discharge Diet Orders (From admission, onward)     Start     Ordered   12/24/23 0000  Diet - low sodium heart healthy        12/24/23 1226           Regular diet DISCHARGE MEDICATION: Allergies as of 12/24/2023       Reactions   Shellfish Protein-containing Drug Products Nausea Only   Scallops        Medication List     STOP taking these medications    clopidogrel  75 MG tablet Commonly known as: PLAVIX    mupirocin  ointment 2 % Commonly known as: BACTROBAN        TAKE these medications    acetaminophen  325 MG tablet Commonly known as: TYLENOL  Take 2 tablets (650 mg total) by mouth every 6 (six) hours as needed for mild pain (pain score 1-3) or fever (or Fever >/= 101).   aspirin  EC 81 MG tablet Take 81 mg by mouth daily.   bisacodyl 5 MG EC tablet Commonly known as: DULCOLAX Take 1 tablet (5 mg total) by mouth daily as needed for moderate constipation.   dexamethasone 1 MG tablet Commonly  known as: DECADRON Take 1 tablet (1 mg total) by mouth daily. Start taking on: December 25, 2023   dorzolamide  2 % ophthalmic solution Commonly known as: TRUSOPT  Place 1 drop into the right eye daily with lunch.   dorzolamide -timolol  2-0.5 % ophthalmic solution Commonly known as: COSOPT  Place 1 drop into both eyes 2 (two) times daily.   doxycycline 100 MG tablet Commonly known as:  VIBRA-TABS Take 1 tablet (100 mg total) by mouth every 12 (twelve) hours for 7 days.   furosemide 20 MG tablet Commonly known as: LASIX Take 20 mg by mouth daily.   gabapentin  100 MG capsule Commonly known as: NEURONTIN  Take 100 mg by mouth 2 (two) times daily with a meal. Pt stated he takes 200 mg in the morning and 100 mg in the evening   hyoscyamine 0.125 MG SL tablet Commonly known as: LEVSIN SL Place 1 tablet (0.125 mg total) under the tongue every 4 (four) hours as needed (excess oral secretions).   Jardiance 10 MG Tabs tablet Generic drug: empagliflozin Take 10 mg by mouth daily.   latanoprost  0.005 % ophthalmic solution Commonly known as: XALATAN  Place 1 drop into the right eye at bedtime.   levothyroxine  50 MCG tablet Commonly known as: SYNTHROID  Take 50 mcg by mouth daily.   LORazepam 0.5 MG tablet Commonly known as: Ativan Take 1 tablet (0.5 mg total) by mouth Once PRN for sedation or anxiety (Before MRI).   losartan  25 MG tablet Commonly known as: COZAAR  Take 1 tablet (25 mg total) by mouth daily.   morphine  20 MG/ML concentrated solution Commonly known as: ROXANOL Take 0.25-0.5 mLs (5-10 mg total) by mouth every 2 (two) hours as needed for severe pain (pain score 7-10).   multivitamin tablet Take 1 tablet by mouth daily.   Myrbetriq  25 MG Tb24 tablet Generic drug: mirabegron  ER Take 25 mg by mouth daily.   ondansetron  4 MG tablet Commonly known as: ZOFRAN  Take 1 tablet (4 mg total) by mouth every 6 (six) hours as needed for nausea.   pantoprazole  20 MG tablet Commonly known as: PROTONIX  Take 20 mg by mouth daily.   pregabalin  75 MG capsule Commonly known as: LYRICA  Take 75 mg by mouth 3 (three) times daily.   QUEtiapine  25 MG tablet Commonly known as: SEROQUEL  Take 25 mg by mouth 2 (two) times daily.   senna-docusate 8.6-50 MG tablet Commonly known as: Senokot-S Take 1 tablet by mouth at bedtime as needed for mild constipation.    spironolactone 25 MG tablet Commonly known as: ALDACTONE Take 12.5 mg by mouth.   traMADol  50 MG tablet Commonly known as: Ultram  Take 1 tablet (50 mg total) by mouth every 6 (six) hours as needed for up to 8 doses.        Follow-up Information     Glover Lenis, MD. Schedule an appointment as soon as possible for a visit.   Specialty: Family Medicine Contact information: [address] [place] [address] [phone]                Discharge Exam: Filed Weights   12/23/23 1216  Weight: 72.7 kg   General.  Frail elderly man, in no acute distress.  Bruising and edema surrounding left eye Pulmonary.  Lungs clear bilaterally, normal respiratory effort. CV.  Regular rate and rhythm, no JVD, rub or murmur. Abdomen.  Soft, nontender, nondistended, BS positive. CNS.  Alert and oriented .  No focal neurologic deficit. Extremities.  No edema, pulses intact  and symmetrical.   Condition at discharge: stable  The results of significant diagnostics from this hospitalization (including imaging, microbiology, ancillary and laboratory) are listed below for reference.   Imaging Studies: MR CERVICAL SPINE WO CONTRAST Result Date: 12/23/2023 EXAM: MRI CERVICAL SPINE WITHOUT CONTRAST 12/23/2023 06:42:59 PM TECHNIQUE: Multiplanar multisequence MRI of the cervical spine was performed. COMPARISON: Comparison with prior CT from earlier the same day. CLINICAL HISTORY: Neck trauma (Age >= 65y). Steve Bryant is a 89 y.o. male with medical history significant of HTN, HLD, COPD, stroke, GERD, AAA s/p repair, CKD stage IIIa, SSS, bilateral blindness, lumbar spine radiculopathy, presented with worsening of bilateral lower extremity weakness ; and fall. FINDINGS: BONES AND ALIGNMENT: Straightening of the normal cervical lordosis. Trace facet mediastinum and anterolisthesis of C7 on T1. Vertebral body heights are maintained. No abnormal marrow edema seen about the previously noted lucency  at the osteophyte arising from the inferior endplate of C2. This is felt to be consistent with a chronic finding. No other MRI evidence for acute traumatic injury within the cervical spine. A few subcentimeter T1 hypointense lesions seen within the T2 and T3 vertebral bodies (series 8, images 9-11) although nonspecific, findings could potentially reflect small osseous metastases given findings on prior chest CT. No other definite worrisome osseous lesions. Prior ACDF at C3 through C7. Solid arthrodesis at C4 through C7. SPINAL CORD: Normal spinal cord size. No abnormal spinal cord signal. SOFT TISSUES: No paraspinal mass. C2-C3: Degenerative vertebral disc space narrowing with diffuse disc bulge and uncovertebral spurring. Moderate facet hypertrophy. Resultant mild spinal stenosis. Foramina appear patent. C3-C4: Prior fusion. No residual spinal stenosis. Uncovertebral spurring with moderate bilateral C4 foraminal narrowing. C4-C5: Prior fusion. No residual spinal stenosis. Right sided uncovertebral spurring with mild right C5 foraminal stenosis. Left neural foraminal patent. C5-C6: Prior fusion. No residual spinal stenosis. Left sided uncovertebral spurring with residual moderate left C6 foraminal narrowing. Right neural foraminal appears patent. C6-C7: Prior fusion. No significant residual spinal stenosis. Right sided uncovertebral spurring with residual moderate right C7 foraminal stenosis. Left neural foraminal appearance patent. C7-T1: Degenerative vertebral disc space narrowing with disc desiccation endplate spurring without significant disc bulge. No significant canal or foraminal stenosis. INTRACRANIAL FINDINGS: Changes of chronic microvascular ischemic disease with multiple remote lacunar infarcts noted about the pons. Chronic bilateral cerebellar infarcts partially visualized. MEDIASTINAL FINDINGS: Heterogeneous right upper mediastinum adenopathy measuring up to 5.2 cm concerning for nodal metastatic  disease. IMPRESSION: 1. No evidence of acute traumatic injury within the cervical spine. Previously noted lucency extending through an osteophyte at C2 is chronic in appearance by MRI. 2. Subcentimeter T1 hypointense lesions involving the T2 and T3 vertebral bodies as above, nonspecific, but suspicious for possible small osseous metastases given findings on corresponding chest CT. 3. Heterogeneous right upper mediastinum adenopathy measuring up to 5.2 cm, concerning for nodal metastatic disease. 4. Prior fusion at C3 through c7 without significant spinal stenosis. 5. Adjacent segment disease at C2-3 with resultant mild spinal stenosis. Mild to moderate multilevel foraminal narrowing as above. Electronically signed by: Morene Hoard MD 12/23/2023 07:30 PM EDT RP Workstation: [id number]   MR BRAIN WO CONTRAST Result Date: 12/23/2023 CLINICAL DATA:  Stroke.  Acute right leg weakness.  Fall. EXAM: MRI HEAD WITHOUT CONTRAST TECHNIQUE: Multiplanar, multiecho pulse sequences of the brain and surrounding structures were obtained without intravenous contrast. COMPARISON:  Head CT 12/23/2023 and MRI 04/15/2020 FINDINGS: Brain: There is no evidence of an acute infarct, mass, midline shift, or extra-axial fluid collection. Chronic  microhemorrhages are again seen in the cerebellum and brainstem as well as a few supratentorial chronic microhemorrhages including in the right thalamus and basal ganglia, potentially secondary to chronic hypertension. There is moderately advanced cerebral atrophy. Patchy to confluent T2 hyperintensities in the cerebral white matter bilaterally and in the pons are stable to slightly increased from the prior MRI and are nonspecific but compatible with severe chronic small vessel ischemic disease. Chronic infarcts are again noted in the deep gray nuclei, pons, and cerebellum. Vascular: Major intracranial vascular flow voids are preserved. Skull and upper cervical spine: Unremarkable bone  marrow signal. Partially visualized anterior cervical spine fusion. Sinuses/Orbits: Bilateral cataract extraction. Mild-to-moderate mucosal thickening in the paranasal sinuses. Clear mastoid air cells. Other: Left forehead scalp hematoma. IMPRESSION: 1. No acute intracranial abnormality. 2. Severe chronic small vessel ischemic disease. Electronically Signed   By: Dasie Hamburg M.D.   On: 12/23/2023 16:44   CT Chest W Contrast Result Date: 12/23/2023 CLINICAL DATA:  Fall.  Abnormal hilar prominence. * Tracking Code: BO * EXAM: CT CHEST WITH CONTRAST TECHNIQUE: Multidetector CT imaging of the chest was performed during intravenous contrast administration. RADIATION DOSE REDUCTION: This exam was performed according to the departmental dose-optimization program which includes automated exposure control, adjustment of the mA and/or kV according to patient size and/or use of iterative reconstruction technique. CONTRAST:  80mL OMNIPAQUE  IOHEXOL  300 MG/ML  SOLN COMPARISON:  Chest radiograph 12/23/2023 FINDINGS: Cardiovascular: Right hilar adenopathy exerts some extrinsic mass effect on right pulmonary vascular structures. Coronary, aortic arch, and branch vessel atherosclerotic vascular disease. Prominent coronary sinus 4.0 cm in diameter. Partially thrombosed descending thoracic aortic aneurysm 4.1 cm in diameter. Mediastinum/Nodes: Right upper mediastinal mass 5.5 by 3.9 by 4.3 cm, internal heterogeneous density, previously 5.4 by 3.9 cm on 04/15/2020. AP window lymph node 1.1 cm in short axis on image 59 series 2. Substantial right hilar and suprahilar adenopathy including a right hilar node measuring 2.0 cm in short axis on image 78 series 2. Right subcarinal node 1.5 cm in short axis, image 80 series 2. Lungs/Pleura: Superior segment right lower lobe mass 6.2 by 3.7 cm on image 82 series 2. Prior wedge resection of part of the right upper lobe. Postobstructive pneumonitis or pneumonia in the right lower lobe. Subsolid  right lower lobe nodule 1.3 by 0.8 cm on image 98 series 4. Honeycombing at the lung bases. Clustered nodularity medially in the superior segment left lower lobe on images 62 through 85 of series 4, infectious etiology favored. Calcified pleural plaques at the right lung apex. 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 The partially thrombosed descending thoracic aortic aneurysm extends into the suprarenal abdominal aorta. Notable atheromatous plaque in the abdominal aorta. Narrowed but not occluded celiac trunk likely due to median arcuate ligament. Narrowed proximal SMA due to atheromatous plaque. Punctate calcifications in the spleen favoring old granulomatous disease. Musculoskeletal: Right second, third, and fourth rib deformities which may be postoperative. Lytic lesion of the right sixth rib measuring 1.8 by 1.2 cm on image 84 series 4. Small lytic lesion of the left third rib anteriorly. 2.0 cm lytic lesion of the anterior T5 vertebral body on image 116 series 6. 1.1 cm lytic lesion of the anterior T6 vertebral body. Schmorl's node along the inferior endplate of T12. Schmorl's node versus lytic lesion along the right superior endplate of L1. Lower  cervical plate and screw fixator. IMPRESSION: 1. Superior segment right lower lobe mass 6.2 by 3.7 cm, suspicious for malignancy. 2. Right hilar and mediastinal adenopathy, suspicious for malignancy. 3. Lytic lesions of the right sixth rib, left third rib, T5 vertebral body, and possibly the L1 vertebral body, suspicious for metastatic disease. 4. Subsolid right lower lobe nodule 1.3 by 0.8 cm. 5. Clustered nodularity medially in the superior segment left lower lobe, infectious etiology favored, but surveillance suggested. 6. 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 7. Partially thrombosed descending thoracic aortic aneurysm 4.1 cm in diameter. This extends into a suprarenal abdominal aortic aneurysm measuring 4.1 cm. This has been followed with CT angiography, consider annual CTA of the chest, abdomen, and pelvis. 8. Narrowed but not occluded celiac trunk likely due to median arcuate ligament. Narrowed proximal SMA due to atheromatous plaque. 9. Honeycombing at the lung bases compatible with interstitial lung disease. 10.  Aortic Atherosclerosis (ICD10-I70.0). Electronically Signed   By: Ryan Salvage M.D.   On: 12/23/2023 15:10   DG Chest Portable 1 View Result Date: 12/23/2023 EXAM: 1 VIEW(S) XRAY OF THE CHEST 12/23/2023 01:28:00 PM COMPARISON: Comparison 07/10/2021. CLINICAL HISTORY: fall. Pt arrived via EMS from home due to a fall. EMS sts that pt was sitting on the edge of the bed and fell and hit his head and has a skin tear on the left arm. Pt has a hematoma to the left eyebrow. EMS sts that pt has a systolic BP of 240's which per ; home health is pt baseline. Pt is blind. FINDINGS: LUNGS AND PLEURA: Postsurgical changes are seen in right upper lobe. Right hilar prominence is noted concerning for possible mass or adenopathy. CT scan of the chest with intravenous contrast is recommended for further evaluation. Calcified pleural plaques are seen in left lung apex. No focal pulmonary opacity. No pulmonary edema. No pleural effusion. No pneumothorax. HEART AND MEDIASTINUM: Aortic atherosclerosis. No acute abnormality of the cardiac and mediastinal silhouettes. BONES AND SOFT TISSUES: No acute osseous abnormality. IMPRESSION: 1. Right hilar prominence, concerning for a mass or adenopathy; recommend contrast-enhanced chest CT for further evaluation. 2. Postsurgical changes in the right upper lobe. 3. Calcified  pleural plaques in the left lung apex. 4. Aortic atherosclerosis. Electronically signed by: Lynwood Seip MD 12/23/2023 02:01 PM EDT RP Workstation: [id number]   DG Hip Unilat W or Wo Pelvis 2-3 Views Right Result Date: 12/23/2023 EXAM: 2 or 3 VIEW(S) XRAY OF THE RIGHT HIP 12/23/2023 01:28:00 PM COMPARISON: None available. CLINICAL HISTORY: fall. EMS sts that pt was sitting on the edge of the bed and fell and hit his head and has a skin tear on the left arm. FINDINGS: BONES AND JOINTS: No acute fracture or focal osseous lesion. The hip joint is maintained. No significant degenerative changes. SOFT TISSUES: The soft tissues are unremarkable. IMPRESSION: 1. No significant abnormality in the right hip or visualized pelvis. Electronically signed by: Lynwood Seip MD 12/23/2023 01:59 PM EDT RP Workstation: [id number]   CT Cervical Spine Wo Contrast Result Date: 12/23/2023 EXAM: CT CERVICAL SPINE WITHOUT CONTRAST 12/23/2023 01:02:34 PM TECHNIQUE: CT of the cervical spine was performed without the administration of intravenous contrast. Multiplanar reformatted images are provided for review. Automated exposure control, iterative reconstruction, and/or weight based adjustment of the mA/kV was utilized to reduce the radiation dose to as low as reasonably achievable. COMPARISON: CT cervical  spine 321 22. CLINICAL HISTORY: Fall. Pt arrived via EMS from home due to a fall. EMS states that pt was sitting on the edge of the bed and fell and hit his head and has a skin tear on the left arm. Pt has a hematoma to the left eyebrow. EMS states that pt has a systolic BP of 240's which per home health is pt baseline. Pt is blind. Pt does not take any blood thinners. FINDINGS: CERVICAL SPINE: BONES AND ALIGNMENT: Anterior cervical fusion hardware spanning C3-C7. Hardware is intact. There is solid osseous fusion from C4-C7. No definite solid osseous fusion across the C3-C4 level. No traumatic malalignment. Vertebral body heights are  maintained. There is a vertically oriented lucency through the osteophytes along the C2 inferior endplate which is new from prior concerning for fractured osteophyte. Chronic deformity of the right 2nd rib. DEGENERATIVE CHANGES: Facet arthrosis and uncovertebral hypertrophy at multiple levels throughout the cervical spine. Disc osteophyte complexes at multiple levels. Mild spinal canal stenosis at C3-C4. There is at least moderate foraminal stenosis at multiple levels. SOFT TISSUES: No prevertebral soft tissue swelling. Similar appearance of a partially visualized hypoattenuating mass at the right lung apex/superior mediastinum which measures up to 5.3 cm. Bilateral pleural calcifications. IMPRESSION: 1. Vertically oriented lucency through osteophytes at the C2 inferior endplate, concerning for fractured osteophyte. Consider MRI for further evaluation. 2. Anterior cervical fusion hardware spanning C3C7. 3. Degenerative changes as above. 4. Similar appearance of right lung apex/superior mediastinal mass. Electronically signed by: Donnice Mania MD 12/23/2023 01:42 PM EDT RP Workstation: [id number]   CT HEAD WO CONTRAST ( ) Result Date: 12/23/2023 EXAM: CT HEAD WITHOUT CONTRAST 12/23/2023 01:02:34 PM TECHNIQUE: CT of the head was performed without the administration of intravenous contrast. Automated exposure control, iterative reconstruction, and/or weight based adjustment of the mA/kV was utilized to reduce the radiation dose to as low as reasonably achievable. COMPARISON: CT head 12/24/2021. CLINICAL HISTORY: Fall on DAPT, hypertensive, eval ICH. Pt arrived via EMS from home due to a fall. EMS states that pt was sitting on the edge of the bed and fell and hit his head and has a skin tear on the left arm. Pt has a hematoma to the left eyebrow. EMS states that pt has a systolic BP of 240s which per home health is pt baseline. Pt is blind. Pt does not take any blood thinners. FINDINGS: BRAIN AND VENTRICLES: No  acute hemorrhage. No evidence of acute infarct. Patchy periventricular and subcortical white matter low-density changes compatible with chronic microvascular ischemic change. Chronic bilateral cerebellar infarcts. Moderate diffuse cerebral volume loss. No hydrocephalus. No extra-axial collection. No mass effect or midline shift. Atherosclerotic calcifications involving the large vessels of the skull base. ORBITS: Bilateral lens replacement. SINUSES: Complete opacification of the left frontal sinus, slightly increased from prior. Scattered mucosal thickening in the ethmoid sinuses with near complete opacification of the left ethmoid sinus. Mucosal thickening in the visualized maxillary sinuses, slightly increased from prior. Complete opacification of the left sphenoid sinus with findings suggestive of chronic sinusitis. SOFT TISSUES AND SKULL: Left frontal scalp hematoma. Similar chronic postsurgical changes of the anterior frontal bones bilaterally. There is no evidence of acute calvarial fracture. IMPRESSION: 1. No acute intracranial abnormality. 2. Left frontal scalp hematoma. 3. Chronic bilateral cerebellar infarcts. 4. Chronic sinusitis as above. Overall, increased opacification of the sinuses compared to prior. Electronically signed by: Donnice Mania MD 12/23/2023 01:33 PM EDT RP Workstation: [id number]   VAS US  ABI WITH/WO TBI Result Date:  12/10/2023  LOWER EXTREMITY DOPPLER STUDY Patient Name:  Steve Bryant  Date of Exam:   12/07/2023 Medical Rec #: [id number]          Accession #:    [id number] Date of Birth: [birth year]         Patient Gender: M Patient Age:   48 years Exam Location:  [facility] Procedure:      VAS US  ABI WITH/WO TBI Referring Phys: --------------------------------------------------------------------------------  High Risk Factors: Hypertension, hyperlipidemia, past history of smoking,                    coronary artery disease, prior CVA.  Vascular Interventions: Coil  embolization of right hypogastric artery for                         endoleak 10/29/2022. Performing Technologist: Donnice Charnley RVT  Examination Guidelines: A complete evaluation includes at minimum, Doppler waveform signals and systolic blood pressure reading at the level of bilateral brachial, anterior tibial, and posterior tibial arteries, when vessel segments are accessible. Bilateral testing is considered an integral part of a complete examination. Photoelectric Plethysmograph (PPG) waveforms and toe systolic pressure readings are included as required and additional duplex testing as needed. Limited examinations for reoccurring indications may be performed as noted.  ABI Findings: +---------+------------------+-----+---------+--------+ Right    Rt Pressure (mmHg)IndexWaveform Comment  +---------+------------------+-----+---------+--------+ Brachial 187                                      +---------+------------------+-----+---------+--------+ PTA                             biphasic          +---------+------------------+-----+---------+--------+ PERO     255               1.36 biphasic          +---------+------------------+-----+---------+--------+ DP       250               1.34 triphasic         +---------+------------------+-----+---------+--------+ Great Toe143               0.76                   +---------+------------------+-----+---------+--------+ +---------+------------------+-----+----------+-------+ Left     Lt Pressure (mmHg)IndexWaveform  Comment +---------+------------------+-----+----------+-------+ Brachial 182                                      +---------+------------------+-----+----------+-------+ PTA      186               0.99 biphasic          +---------+------------------+-----+----------+-------+ DP       250               1.34 monophasic        +---------+------------------+-----+----------+-------+ Great Toe93                 0.50                   +---------+------------------+-----+----------+-------+ +-------+----------------+-----------+------------+------------+ ABI/TBIToday's ABI     Today's TBIPrevious ABIPrevious TBI +-------+----------------+-----------+------------+------------+ Right  Non compressible0.76                                +-------+----------------+-----------+------------+------------+  Left   Non compressible0.50                                +-------+----------------+-----------+------------+------------+   Summary: Right: Resting right ankle-brachial index indicates noncompressible right lower extremity arteries. The right toe-brachial index is normal.  Left: Resting left ankle-brachial index indicates noncompressible left lower extremity arteries. The left toe-brachial index is abnormal.  *See table(s) above for measurements and observations.  Electronically signed by Selinda Gu MD on 12/10/2023 at 9:39:00 AM.    Final     Microbiology: Results for orders placed or performed during the hospital encounter of 07/10/21  Culture, blood (Routine x 2)     Status: None   Collection Time: 07/10/21 10:52 AM   Specimen: BLOOD  Result Value Ref Range Status   Specimen Description BLOOD BLOOD RIGHT FOREARM  Final   Special Requests   Final    BOTTLES DRAWN AEROBIC AND ANAEROBIC Blood Culture adequate volume   Culture   Final    NO GROWTH 5 DAYS Performed at [facility], [address] [place], [place] [address]    Report Status 07/15/2021 FINAL  Final  Culture, blood (Routine x 2)     Status: None   Collection Time: 07/10/21 11:32 AM   Specimen: BLOOD  Result Value Ref Range Status   Specimen Description BLOOD RIGHT ANTECUBITAL  Final   Special Requests   Final    BOTTLES DRAWN AEROBIC AND ANAEROBIC Blood Culture results may not be optimal due to an excessive volume of blood received in culture bottles   Culture   Final    NO GROWTH 5 DAYS Performed at  [facility], [address] [place], [place] [address]    Report Status 07/15/2021 FINAL  Final  Resp Panel by RT-PCR (Flu A&B, Covid) Nasopharyngeal Swab     Status: None   Collection Time: 07/10/21  1:01 PM   Specimen: Nasopharyngeal Swab; Nasopharyngeal(NP) swabs in vial transport medium  Result Value Ref Range Status   SARS Coronavirus 2 by RT PCR NEGATIVE NEGATIVE Final    Comment: (NOTE) SARS-CoV-2 target nucleic acids are NOT DETECTED.  The SARS-CoV-2 RNA is generally detectable in upper respiratory specimens during the acute phase of infection. The lowest concentration of SARS-CoV-2 viral copies this assay can detect is 138 copies/mL. A negative result does not preclude SARS-Cov-2 infection and should not be used as the sole basis for treatment or other patient management decisions. A negative result may occur with  improper specimen collection/handling, submission of specimen other than nasopharyngeal swab, presence of viral mutation(s) within the areas targeted by this assay, and inadequate number of viral copies(<138 copies/mL). A negative result must be combined with clinical observations, patient history, and epidemiological information. The expected result is Negative.  Fact Sheet for Patients:  BloggerCourse.com  Fact Sheet for Healthcare Providers:  SeriousBroker.it  This test is no t yet approved or cleared by the [place]  FDA and  has been authorized for detection and/or diagnosis of SARS-CoV-2 by FDA under an Emergency Use Authorization (EUA). This EUA will remain  in effect (meaning this test can be used) for the duration of the COVID-19 declaration under Section 564(b)(1) of the Act, 21 U.S.C.section 360bbb-3(b)(1), unless the authorization is terminated  or revoked sooner.       Influenza A by PCR NEGATIVE NEGATIVE Final   Influenza B by PCR NEGATIVE NEGATIVE Final    Comment: (  NOTE) The  Xpert Xpress SARS-CoV-2/FLU/RSV plus assay is intended as an aid in the diagnosis of influenza from Nasopharyngeal swab specimens and should not be used as a sole basis for treatment. Nasal washings and aspirates are unacceptable for Xpert Xpress SARS-CoV-2/FLU/RSV testing.  Fact Sheet for Patients: BloggerCourse.com  Fact Sheet for Healthcare Providers: SeriousBroker.it  This test is not yet approved or cleared by the [place]  FDA and has been authorized for detection and/or diagnosis of SARS-CoV-2 by FDA under an Emergency Use Authorization (EUA). This EUA will remain in effect (meaning this test can be used) for the duration of the COVID-19 declaration under Section 564(b)(1) of the Act, 21 U.S.C. section 360bbb-3(b)(1), unless the authorization is terminated or revoked.  Performed at [facility], [address] [place], [place] [address]     Labs: CBC: Recent Labs  Lab 12/23/23 1224  WBC 9.3  NEUTROABS 5.9  HGB 14.8  HCT 46.0  MCV 85.0  PLT 223   Basic Metabolic Panel: Recent Labs  Lab 12/23/23 1224  NA 141  K 4.2  CL 104  CO2 24  GLUCOSE 104*  BUN 22  CREATININE 1.08  CALCIUM  9.5  MG 2.3   Liver Function Tests: Recent Labs  Lab 12/23/23 1224  AST 30  ALT 10  ALKPHOS 86  BILITOT 1.5*  PROT 7.1  ALBUMIN 3.5   CBG: No results for input(s): GLUCAP in the last 168 hours.  Discharge time spent: greater than 30 minutes.  This record has been created using Conservation officer, historic buildings. Errors have been sought and corrected,but may not always be located. Such creation errors do not reflect on the standard of care.   Signed: Amaryllis Dare, MD [facility] 12/24/2023

## 2023-12-24 NOTE — Consult Note (Signed)
 Hematology/Oncology Consult note [facility] [phone] [phone]  Patient Care Team: Glover Lenis, MD as PCP - General (Family Medicine)   Name of the patient: Steve Bryant  [id number]  13-Jul-1925    Reason for referral- ***   Referring physician- ***  Date of visit: @TODAY @   History of presenting illness- ***  ECOG PS- ***  Pain scale- ***   Review of systems- ROS  Allergies  Allergen Reactions   Shellfish Protein-Containing Drug Products Nausea Only    Scallops    Patient Active Problem List   Diagnosis Date Noted   Palliative care encounter 12/24/2023   Traumatic hematoma of forehead 12/24/2023   C2 cervical fracture (HCC) 12/23/2023   Metastatic lung cancer (metastasis from lung to other site), right (HCC) 12/23/2023   Closed cervical spine fracture (HCC) 12/23/2023   PAD (peripheral artery disease) 12/07/2023   CAP (community acquired pneumonia) 07/10/2021   Sepsis (HCC) 07/10/2021   COPD (chronic obstructive pulmonary disease) (HCC) 07/10/2021   Chronic diastolic CHF (congestive heart failure) (HCC) 07/10/2021   HTN (hypertension) 07/10/2021   Hypothyroidism 07/10/2021   Depression 07/10/2021   CAD (coronary artery disease) 07/10/2021   Acute renal failure superimposed on stage 3a chronic kidney disease (HCC) 07/10/2021   SOBOE (shortness of breath on exertion) 11/13/2020   Pneumonia 05/28/2020   Frequent falls 05/28/2020   Acute respiratory failure (HCC) 05/28/2020   Aphasia    Stenosis of left carotid artery    Subclavian vein stenosis, right    TIA (transient ischemic attack) 04/15/2020   Osteoarthritis of both hips 09/28/2019   Hallucinations, visual 02/09/2019   Hyperlipidemia 11/04/2016   CVA (cerebral vascular accident) (HCC) 10/15/2016   Essential hypertension, benign 09/18/2016   Stroke (HCC) 09/18/2016   AAA (abdominal aortic aneurysm) without rupture 09/18/2016   Gait instability  08/26/2016   History of COPD 11/22/2013   SSS (sick sinus syndrome) (HCC) 11/22/2013   DDD (degenerative disc disease), lumbar 10/02/2013   Lumbar radiculitis 10/02/2013   Arthritis 05/18/2013   Asthma 05/18/2013   Degenerative joint disease of cervical and lumbar spine 05/18/2013   Peripheral neuropathy 05/18/2013   Renal insufficiency 05/18/2013     Past Medical History:  Diagnosis Date   AAA (abdominal aortic aneurysm)    a.) Abd US  12/22/16: measured 4.5 cm. b.) CTA 12/27/18: fusiform infrarenal AAA measured 4.7 cm. c.) Abd US  07/04/19: measured 5.0 cm. d.) CTA 11/03/19: measured 4.9 cm. e.) CTA 10/21/20: interval increase in size to 5.4 x 5.0 cm extending to the iliac bifurcation.   Acute ischemic stroke (HCC) 10/16/2016   a.) MRI --> 13 x 14 mm ischemic, non-hemorrhagic, RIGHT paramedian ventral pons territory   Aortic atherosclerosis    Arthritis    Basal cell carcinoma 06/25/2022   R preauricular, EDC done   BCC (basal cell carcinoma) 04/16/2022   right cheek, tx with [facility] 06/25/22   BCC (basal cell carcinoma) 04/16/2022   left prox pretibia, tx'd with EDC   Bilateral renal cysts    Bradycardia    Carotid artery disease    a.) Doppler 07/02/09: RIGHT 50-75%, LEFT < 50%. b.) CTA 07/04/09: RIGHT 90%. LEFT 50-75%. c.) CTA 10/15/2016: RIGHT with no significant stenosis, LEFT 60%.   CHF (congestive heart failure) (HCC)    Chronic anticoagulation    a.) DAPT therapy (ASA + clopidogrel )   Coronary artery disease    DDD (degenerative disc disease), lumbar    Diastolic dysfunction 04/16/2020   a.)  TTE 04/16/20: EF 60-65%; G1DD.   DOE (dyspnea on exertion)    Dyspnea    GERD (gastroesophageal reflux disease)    Glaucoma    Hyperlipidemia    Hypertension    Legally blind    Neck mass    a.) CT 07/04/09: 4.9 cm supraclavicular mass just posterior to clavicle; PET CT 07/05/09 showed mass to be FDG avid with max SUV of 6. b.) CT 10/15/16: measured 4 x 5 cm. c.) CT 04/15/20:  measured 5.1 x 3.7 cm.   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 WD SCC. EDC   Squamous cell carcinoma of skin 06/15/2019   Right calf. KA-type. EDC   Squamous cell carcinoma of skin 11/13/2020   Right distal hand (in situ) - EDC   Squamous cell carcinoma of skin 11/13/2020   Right prox hand (in situ) - EDC   TIA (transient ischemic attack) 04/15/2020   Valvular regurgitation    a.) TTE 10/17/2016 : EF 55-60%; mild AR/MR. b.) TTE 04/16/2020: EF 60-65%; G1DD, mild AR, triv. MR. c.) TTE 12/09/20: EF >55%; mild AR/TR, triv. PR, mod. MR.     Past Surgical History:  Procedure Laterality Date   CERVICAL LAMINECTOMY N/A 05/2006   Fusion C3-C7   EMBOLIZATION (CATH LAB) N/A 10/29/2022   Procedure: EMBOLIZATION;  Surgeon: Marea Selinda RAMAN, MD;  Location: [facility] INVASIVE CV LAB;  Service: Cardiovascular;  Laterality: N/A;   ENDOVASCULAR STENT GRAFT (AAA) N/A 12/25/2020   Procedure: ENDOVASCULAR REPAIR/STENT GRAFT;  Surgeon: Marea Selinda RAMAN, MD;  Location: [facility] INVASIVE CV LAB;  Service: Cardiovascular;  Laterality: N/A;   GLAUCOMA SURGERY  2003   INGUINAL HERNIA REPAIR Right 2007   TUMOR REMOVAL Right 1977   Thoracotomy with RUL pulmonary mass resection    Social History   Socioeconomic History   Marital status: Widowed    Spouse name: Not on file   Number of children: Not on file   Years of education: Not on file   Highest education level: Not on file  Occupational History   Not on file  Tobacco Use   Smoking status: Former    Current packs/day: 0.00    Average packs/day: 1 pack/day for 25.0 years (25.0 ttl pk-yrs)    Types: Cigarettes    Start date: 70    Quit date: 19    Years since  quitting: 46.8   Smokeless tobacco: Never  Vaping Use   Vaping status: Never Used  Substance and Sexual Activity   Alcohol use: Not Currently    Comment: occasionally   Drug use: No   Sexual activity: Not on file  Other Topics Concern   Not on file  Social History Narrative   Lives alone, has 24 hour care takers. No indoor pets.    Social Drivers of Corporate investment banker Strain: Low Risk  (06/03/2023)   Received from [facility]   Overall Financial Resource Strain (CARDIA)    Difficulty of Paying Living Expenses: Not hard at all  Food Insecurity: No Food Insecurity (12/23/2023)   Hunger Vital Sign    Worried About Running Out of Food in the Last Year: Never true  Ran Out of Food in the Last Year: Never true  Transportation Needs: Patient Declined (12/23/2023)   PRAPARE - Administrator, Civil Service (Medical): Patient declined    Lack of Transportation (Non-Medical): Patient declined  Physical Activity: Not on file  Stress: Not on file  Social Connections: Socially Isolated (12/23/2023)   Social Connection and Isolation Panel    Frequency of Communication with Friends and Family: More than three times a week    Frequency of Social Gatherings with Friends and Family: More than three times a week    Attends Religious Services: Never    Database administrator or Organizations: No    Attends Banker Meetings: Never    Marital Status: Widowed  Intimate Partner Violence: Patient Declined (12/23/2023)   Humiliation, Afraid, Rape, and Kick questionnaire    Fear of Current or Ex-Partner: Patient declined    Emotionally Abused: Patient declined    Physically Abused: Patient declined    Sexually Abused: Patient declined     Family History  Problem Relation Age of Onset   Stroke Mother    Stroke Father    Cancer Brother      Current Facility-Administered Medications:    acetaminophen  (TYLENOL ) tablet 650 mg, 650 mg, Oral,  Q6H PRN, 650 mg at 12/24/23 1240 **OR** acetaminophen  (TYLENOL ) suppository 650 mg, 650 mg, Rectal, Q6H PRN, Laurita Cort DASEN, MD   aspirin  EC tablet 81 mg, 81 mg, Oral, Daily, Laurita Cort T, MD, 81 mg at 12/24/23 1027   benzonatate (TESSALON) capsule 100 mg, 100 mg, Oral, TID PRN, Zhang, Ping T, MD   bisacodyl (DULCOLAX) EC tablet 5 mg, 5 mg, Oral, Daily PRN, Laurita Cort T, MD   clopidogrel  (PLAVIX ) tablet 75 mg, 75 mg, Oral, Daily, Laurita, Ping T, MD, 75 mg at 12/24/23 1025   dexamethasone (DECADRON) tablet 1 mg, 1 mg, Oral, Daily, Borders, Joshua R, NP, 1 mg at 12/24/23 1027   dorzolamide  (TRUSOPT ) 2 % ophthalmic solution 1 drop, 1 drop, Right Eye, Q lunch, Laurita Cort T, MD   doxycycline (VIBRA-TABS) tablet 100 mg, 100 mg, Oral, Q12H, Zhang, Ping T, MD, 100 mg at 12/24/23 1027   enoxaparin  (LOVENOX ) injection 40 mg, 40 mg, Subcutaneous, Q24H, Zhang, Ping T, MD, 40 mg at 12/23/23 2129   furosemide (LASIX) tablet 20 mg, 20 mg, Oral, Daily, Zhang, Ping T, MD   guaiFENesin  (MUCINEX ) 12 hr tablet 1,200 mg, 1,200 mg, Oral, BID, Zhang, Ping T, MD, 1,200 mg at 12/23/23 2129   hydrALAZINE  (APRESOLINE ) injection 5 mg, 5 mg, Intravenous, Q6H PRN, Laurita Cort T, MD   latanoprost  (XALATAN ) 0.005 % ophthalmic solution 1 drop, 1 drop, Right Eye, QHS, Laurita Cort T, MD, 1 drop at 12/23/23 2130   levothyroxine  (SYNTHROID ) tablet 50 mcg, 50 mcg, Oral, Daily, Laurita Cort T, MD, 50 mcg at 12/24/23 0529   LORazepam (ATIVAN) tablet 0.5 mg, 0.5 mg, Oral, Once PRN, Laurita Cort T, MD   losartan  (COZAAR ) tablet 25 mg, 25 mg, Oral, Daily, Laurita, Ping T, MD, 25 mg at 12/24/23 1026   morphine  (PF) 2 MG/ML injection 2 mg, 2 mg, Intravenous, Q4H PRN, Mansy, Jan A, MD, 2 mg at 12/24/23 1238   morphine  CONCENTRATE 10 mg / 0.5 ml oral solution 5-10 mg, 5-10 mg, Oral, Q2H PRN, Borders, Joshua R, NP, 10 mg at 12/24/23 1134   ondansetron  (ZOFRAN ) tablet 4 mg, 4 mg, Oral, Q6H PRN **OR** ondansetron  (ZOFRAN ) injection 4 mg, 4 mg,  Intravenous,  Q6H PRN, Laurita Manor T, MD   pantoprazole  (PROTONIX ) EC tablet 20 mg, 20 mg, Oral, Daily, Zhang, Ping T, MD, 20 mg at 12/24/23 1026   QUEtiapine  (SEROQUEL ) tablet 25 mg, 25 mg, Oral, BID, Zhang, Ping T, MD, 25 mg at 12/23/23 2129   senna-docusate (Senokot-S) tablet 1 tablet, 1 tablet, Oral, QHS PRN, Zhang, Ping T, MD   spironolactone (ALDACTONE) tablet 12.5 mg, 12.5 mg, Oral, Daily, Zhang, Ping T, MD, 12.5 mg at 12/24/23 1026  Current Outpatient Medications:    aspirin  EC 81 MG tablet, Take 81 mg by mouth daily., Disp: , Rfl:    dorzolamide  (TRUSOPT ) 2 % ophthalmic solution, Place 1 drop into the right eye daily with lunch., Disp: , Rfl:    dorzolamide -timolol  (COSOPT ) 22.3-6.8 MG/ML ophthalmic solution, Place 1 drop into both eyes 2 (two) times daily., Disp: , Rfl: 2   furosemide (LASIX) 20 MG tablet, Take 20 mg by mouth daily., Disp: , Rfl:    gabapentin  (NEURONTIN ) 100 MG capsule, Take 100 mg by mouth 2 (two) times daily with a meal. Pt stated he takes 200 mg in the morning and 100 mg in the evening, Disp: , Rfl:    hyoscyamine (LEVSIN SL) 0.125 MG SL tablet, Place 1 tablet (0.125 mg total) under the tongue every 4 (four) hours as needed (excess oral secretions)., Disp: 42 tablet, Rfl: 0   JARDIANCE 10 MG TABS tablet, Take 10 mg by mouth daily., Disp: , Rfl:    latanoprost  (XALATAN ) 0.005 % ophthalmic solution, Place 1 drop into the right eye at bedtime., Disp: , Rfl:    levothyroxine  (SYNTHROID ) 50 MCG tablet, Take 50 mcg by mouth daily., Disp: , Rfl:    losartan  (COZAAR ) 25 MG tablet, Take 1 tablet (25 mg total) by mouth daily., Disp: 30 tablet, Rfl: 2   morphine  (ROXANOL) 20 MG/ML concentrated solution, Take 0.25-0.5 mLs (5-10 mg total) by mouth every 2 (two) hours as needed for severe pain (pain score 7-10)., Disp: 30 mL, Rfl: 0   Multiple Vitamin (MULTIVITAMIN) tablet, Take 1 tablet by mouth daily., Disp: , Rfl:    MYRBETRIQ  25 MG TB24 tablet, Take 25 mg by mouth daily., Disp:  , Rfl:    pantoprazole  (PROTONIX ) 20 MG tablet, Take 20 mg by mouth daily., Disp: , Rfl:    pregabalin  (LYRICA ) 75 MG capsule, Take 75 mg by mouth 3 (three) times daily., Disp: , Rfl:    QUEtiapine  (SEROQUEL ) 25 MG tablet, Take 25 mg by mouth 2 (two) times daily., Disp: , Rfl:    spironolactone (ALDACTONE) 25 MG tablet, Take 12.5 mg by mouth., Disp: , Rfl:    traMADol  (ULTRAM ) 50 MG tablet, Take 1 tablet (50 mg total) by mouth every 6 (six) hours as needed for up to 8 doses., Disp: 8 tablet, Rfl: 0   acetaminophen  (TYLENOL ) 325 MG tablet, Take 2 tablets (650 mg total) by mouth every 6 (six) hours as needed for mild pain (pain score 1-3) or fever (or Fever >/= 101)., Disp: 100 tablet, Rfl: 1   bisacodyl (DULCOLAX) 5 MG EC tablet, Take 1 tablet (5 mg total) by mouth daily as needed for moderate constipation., Disp: 30 tablet, Rfl: 0   [START ON 12/25/2023] dexamethasone (DECADRON) 1 MG tablet, Take 1 tablet (1 mg total) by mouth daily., Disp: 10 tablet, Rfl: 0   doxycycline (VIBRA-TABS) 100 MG tablet, Take 1 tablet (100 mg total) by mouth every 12 (twelve) hours for 7 days., Disp: 14 tablet, Rfl: 0  LORazepam (ATIVAN) 0.5 MG tablet, Take 1 tablet (0.5 mg total) by mouth Once PRN for sedation or anxiety (Before MRI)., Disp: 30 tablet, Rfl: 0   ondansetron  (ZOFRAN ) 4 MG tablet, Take 1 tablet (4 mg total) by mouth every 6 (six) hours as needed for nausea., Disp: 20 tablet, Rfl: 0   senna-docusate (SENOKOT-S) 8.6-50 MG tablet, Take 1 tablet by mouth at bedtime as needed for mild constipation., Disp: 30 tablet, Rfl: 1   Physical exam:  Vitals:   12/23/23 1721 12/23/23 2111 12/24/23 0239 12/24/23 0726  BP: (!) 213/112 (!) 142/67 (!) 148/58 (!) 146/90  Pulse: 79 89 78   Resp: 20 16 18 17   Temp: 98.1 F (36.7 C) 98.2 F (36.8 C) 98.3 F (36.8 C)   TempSrc: Oral     SpO2: 99% 94% 91%   Weight:      Height:       Physical Exam        Latest Ref Rng & Units 12/23/2023   12:24 PM  CMP   Glucose 70 - 99 mg/dL 895   BUN 8 - 23 mg/dL 22   Creatinine 9.38 - 1.24 mg/dL 8.91   Sodium 864 - 854 mmol/L 141   Potassium 3.5 - 5.1 mmol/L 4.2   Chloride 98 - 111 mmol/L 104   CO2 22 - 32 mmol/L 24   Calcium  8.9 - 10.3 mg/dL 9.5   Total Protein 6.5 - 8.1 g/dL 7.1   Total Bilirubin 0.0 - 1.2 mg/dL 1.5   Alkaline Phos 38 - 126 U/L 86   AST 15 - 41 U/L 30   ALT 0 - 44 U/L 10       Latest Ref Rng & Units 12/23/2023   12:24 PM  CBC  WBC 4.0 - 10.5 K/uL 9.3   Hemoglobin 13.0 - 17.0 g/dL 85.1   Hematocrit 60.9 - 52.0 % 46.0   Platelets 150 - 400 K/uL 223     @IMAGES @  MR CERVICAL SPINE WO CONTRAST Result Date: 12/23/2023 EXAM: MRI CERVICAL SPINE WITHOUT CONTRAST 12/23/2023 06:42:59 PM TECHNIQUE: Multiplanar multisequence MRI of the cervical spine was performed. COMPARISON: Comparison with prior CT from earlier the same day. CLINICAL HISTORY: Neck trauma (Age >= 65y). MARCHELLO ROTHGEB is a 89 y.o. male with medical history significant of HTN, HLD, COPD, stroke, GERD, AAA s/p repair, CKD stage IIIa, SSS, bilateral blindness, lumbar spine radiculopathy, presented with worsening of bilateral lower extremity weakness ; and fall. FINDINGS: BONES AND ALIGNMENT: Straightening of the normal cervical lordosis. Trace facet mediastinum and anterolisthesis of C7 on T1. Vertebral body heights are maintained. No abnormal marrow edema seen about the previously noted lucency at the osteophyte arising from the inferior endplate of C2. This is felt to be consistent with a chronic finding. No other MRI evidence for acute traumatic injury within the cervical spine. A few subcentimeter T1 hypointense lesions seen within the T2 and T3 vertebral bodies (series 8, images 9-11) although nonspecific, findings could potentially reflect small osseous metastases given findings on prior chest CT. No other definite worrisome osseous lesions. Prior ACDF at C3 through C7. Solid arthrodesis at C4 through C7. SPINAL CORD:  Normal spinal cord size. No abnormal spinal cord signal. SOFT TISSUES: No paraspinal mass. C2-C3: Degenerative vertebral disc space narrowing with diffuse disc bulge and uncovertebral spurring. Moderate facet hypertrophy. Resultant mild spinal stenosis. Foramina appear patent. C3-C4: Prior fusion. No residual spinal stenosis. Uncovertebral spurring with moderate bilateral C4 foraminal narrowing. C4-C5: Prior fusion.  No residual spinal stenosis. Right sided uncovertebral spurring with mild right C5 foraminal stenosis. Left neural foraminal patent. C5-C6: Prior fusion. No residual spinal stenosis. Left sided uncovertebral spurring with residual moderate left C6 foraminal narrowing. Right neural foraminal appears patent. C6-C7: Prior fusion. No significant residual spinal stenosis. Right sided uncovertebral spurring with residual moderate right C7 foraminal stenosis. Left neural foraminal appearance patent. C7-T1: Degenerative vertebral disc space narrowing with disc desiccation endplate spurring without significant disc bulge. No significant canal or foraminal stenosis. INTRACRANIAL FINDINGS: Changes of chronic microvascular ischemic disease with multiple remote lacunar infarcts noted about the pons. Chronic bilateral cerebellar infarcts partially visualized. MEDIASTINAL FINDINGS: Heterogeneous right upper mediastinum adenopathy measuring up to 5.2 cm concerning for nodal metastatic disease. IMPRESSION: 1. No evidence of acute traumatic injury within the cervical spine. Previously noted lucency extending through an osteophyte at C2 is chronic in appearance by MRI. 2. Subcentimeter T1 hypointense lesions involving the T2 and T3 vertebral bodies as above, nonspecific, but suspicious for possible small osseous metastases given findings on corresponding chest CT. 3. Heterogeneous right upper mediastinum adenopathy measuring up to 5.2 cm, concerning for nodal metastatic disease. 4. Prior fusion at C3 through c7 without  significant spinal stenosis. 5. Adjacent segment disease at C2-3 with resultant mild spinal stenosis. Mild to moderate multilevel foraminal narrowing as above. Electronically signed by: Morene Hoard MD 12/23/2023 07:30 PM EDT RP Workstation: [id number]   MR BRAIN WO CONTRAST Result Date: 12/23/2023 CLINICAL DATA:  Stroke.  Acute right leg weakness.  Fall. EXAM: MRI HEAD WITHOUT CONTRAST TECHNIQUE: Multiplanar, multiecho pulse sequences of the brain and surrounding structures were obtained without intravenous contrast. COMPARISON:  Head CT 12/23/2023 and MRI 04/15/2020 FINDINGS: Brain: There is no evidence of an acute infarct, mass, midline shift, or extra-axial fluid collection. Chronic microhemorrhages are again seen in the cerebellum and brainstem as well as a few supratentorial chronic microhemorrhages including in the right thalamus and basal ganglia, potentially secondary to chronic hypertension. There is moderately advanced cerebral atrophy. Patchy to confluent T2 hyperintensities in the cerebral white matter bilaterally and in the pons are stable to slightly increased from the prior MRI and are nonspecific but compatible with severe chronic small vessel ischemic disease. Chronic infarcts are again noted in the deep gray nuclei, pons, and cerebellum. Vascular: Major intracranial vascular flow voids are preserved. Skull and upper cervical spine: Unremarkable bone marrow signal. Partially visualized anterior cervical spine fusion. Sinuses/Orbits: Bilateral cataract extraction. Mild-to-moderate mucosal thickening in the paranasal sinuses. Clear mastoid air cells. Other: Left forehead scalp hematoma. IMPRESSION: 1. No acute intracranial abnormality. 2. Severe chronic small vessel ischemic disease. Electronically Signed   By: Dasie Hamburg M.D.   On: 12/23/2023 16:44   CT Chest W Contrast Result Date: 12/23/2023 CLINICAL DATA:  Fall.  Abnormal hilar prominence. * Tracking Code: BO * EXAM: CT CHEST  WITH CONTRAST TECHNIQUE: Multidetector CT imaging of the chest was performed during intravenous contrast administration. RADIATION DOSE REDUCTION: This exam was performed according to the departmental dose-optimization program which includes automated exposure control, adjustment of the mA and/or kV according to patient size and/or use of iterative reconstruction technique. CONTRAST:  80mL OMNIPAQUE  IOHEXOL  300 MG/ML  SOLN COMPARISON:  Chest radiograph 12/23/2023 FINDINGS: Cardiovascular: Right hilar adenopathy exerts some extrinsic mass effect on right pulmonary vascular structures. Coronary, aortic arch, and branch vessel atherosclerotic vascular disease. Prominent coronary sinus 4.0 cm in diameter. Partially thrombosed descending thoracic aortic aneurysm 4.1 cm in diameter. Mediastinum/Nodes: Right upper mediastinal mass 5.5 by  3.9 by 4.3 cm, internal heterogeneous density, previously 5.4 by 3.9 cm on 04/15/2020. AP window lymph node 1.1 cm in short axis on image 59 series 2. Substantial right hilar and suprahilar adenopathy including a right hilar node measuring 2.0 cm in short axis on image 78 series 2. Right subcarinal node 1.5 cm in short axis, image 80 series 2. Lungs/Pleura: Superior segment right lower lobe mass 6.2 by 3.7 cm on image 82 series 2. Prior wedge resection of part of the right upper lobe. Postobstructive pneumonitis or pneumonia in the right lower lobe. Subsolid right lower lobe nodule 1.3 by 0.8 cm on image 98 series 4. Honeycombing at the lung bases. Clustered nodularity medially in the superior segment left lower lobe on images 62 through 85 of series 4, infectious etiology favored. Calcified pleural plaques at the right lung apex. 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 The partially thrombosed  descending thoracic aortic aneurysm extends into the suprarenal abdominal aorta. Notable atheromatous plaque in the abdominal aorta. Narrowed but not occluded celiac trunk likely due to median arcuate ligament. Narrowed proximal SMA due to atheromatous plaque. Punctate calcifications in the spleen favoring old granulomatous disease. Musculoskeletal: Right second, third, and fourth rib deformities which may be postoperative. Lytic lesion of the right sixth rib measuring 1.8 by 1.2 cm on image 84 series 4. Small lytic lesion of the left third rib anteriorly. 2.0 cm lytic lesion of the anterior T5 vertebral body on image 116 series 6. 1.1 cm lytic lesion of the anterior T6 vertebral body. Schmorl's node along the inferior endplate of T12. Schmorl's node versus lytic lesion along the right superior endplate of L1. Lower cervical plate and screw fixator. IMPRESSION: 1. Superior segment right lower lobe mass 6.2 by 3.7 cm, suspicious for malignancy. 2. Right hilar and mediastinal adenopathy, suspicious for malignancy. 3. Lytic lesions of the right sixth rib, left third rib, T5 vertebral body, and possibly the L1 vertebral body, suspicious for metastatic disease. 4. Subsolid right lower lobe nodule 1.3 by 0.8 cm. 5. Clustered nodularity medially in the superior segment left lower lobe, infectious etiology favored, but surveillance suggested. 6. Porcelain gallbladder versus very large gallstone filling the gallbladder. 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 7. Partially thrombosed descending thoracic aortic aneurysm 4.1 cm in diameter. This extends into a suprarenal abdominal aortic aneurysm measuring 4.1 cm. This has been followed with CT angiography, consider annual CTA of the chest, abdomen, and pelvis. 8. Narrowed but not occluded celiac trunk likely due to median arcuate ligament. Narrowed proximal SMA due to atheromatous  plaque. 9. Honeycombing at the lung bases compatible with interstitial lung disease. 10.  Aortic Atherosclerosis (ICD10-I70.0). Electronically Signed   By: Ryan Salvage M.D.   On: 12/23/2023 15:10   DG Chest Portable 1 View Result Date: 12/23/2023 EXAM: 1 VIEW(S) XRAY OF THE CHEST 12/23/2023 01:28:00 PM COMPARISON: Comparison 07/10/2021. CLINICAL HISTORY: fall. Pt arrived via EMS from home due to a fall. EMS sts that pt was sitting on the edge of the bed and fell and hit his head and has a skin tear on the left arm. Pt has a hematoma to the left eyebrow. EMS sts that pt has a systolic BP of 240's which per ; home health is pt baseline. Pt is blind. FINDINGS: LUNGS AND PLEURA:  Postsurgical changes are seen in right upper lobe. Right hilar prominence is noted concerning for possible mass or adenopathy. CT scan of the chest with intravenous contrast is recommended for further evaluation. Calcified pleural plaques are seen in left lung apex. No focal pulmonary opacity. No pulmonary edema. No pleural effusion. No pneumothorax. HEART AND MEDIASTINUM: Aortic atherosclerosis. No acute abnormality of the cardiac and mediastinal silhouettes. BONES AND SOFT TISSUES: No acute osseous abnormality. IMPRESSION: 1. Right hilar prominence, concerning for a mass or adenopathy; recommend contrast-enhanced chest CT for further evaluation. 2. Postsurgical changes in the right upper lobe. 3. Calcified pleural plaques in the left lung apex. 4. Aortic atherosclerosis. Electronically signed by: Lynwood Seip MD 12/23/2023 02:01 PM EDT RP Workstation: [id number]   DG Hip Unilat W or Wo Pelvis 2-3 Views Right Result Date: 12/23/2023 EXAM: 2 or 3 VIEW(S) XRAY OF THE RIGHT HIP 12/23/2023 01:28:00 PM COMPARISON: None available. CLINICAL HISTORY: fall. EMS sts that pt was sitting on the edge of the bed and fell and hit his head and has a skin tear on the left arm. FINDINGS: BONES AND JOINTS: No acute fracture or focal osseous lesion.  The hip joint is maintained. No significant degenerative changes. SOFT TISSUES: The soft tissues are unremarkable. IMPRESSION: 1. No significant abnormality in the right hip or visualized pelvis. Electronically signed by: Lynwood Seip MD 12/23/2023 01:59 PM EDT RP Workstation: [id number]   CT Cervical Spine Wo Contrast Result Date: 12/23/2023 EXAM: CT CERVICAL SPINE WITHOUT CONTRAST 12/23/2023 01:02:34 PM TECHNIQUE: CT of the cervical spine was performed without the administration of intravenous contrast. Multiplanar reformatted images are provided for review. Automated exposure control, iterative reconstruction, and/or weight based adjustment of the mA/kV was utilized to reduce the radiation dose to as low as reasonably achievable. COMPARISON: CT cervical spine 321 22. CLINICAL HISTORY: Fall. Pt arrived via EMS from home due to a fall. EMS states that pt was sitting on the edge of the bed and fell and hit his head and has a skin tear on the left arm. Pt has a hematoma to the left eyebrow. EMS states that pt has a systolic BP of 240's which per home health is pt baseline. Pt is blind. Pt does not take any blood thinners. FINDINGS: CERVICAL SPINE: BONES AND ALIGNMENT: Anterior cervical fusion hardware spanning C3-C7. Hardware is intact. There is solid osseous fusion from C4-C7. No definite solid osseous fusion across the C3-C4 level. No traumatic malalignment. Vertebral body heights are maintained. There is a vertically oriented lucency through the osteophytes along the C2 inferior endplate which is new from prior concerning for fractured osteophyte. Chronic deformity of the right 2nd rib. DEGENERATIVE CHANGES: Facet arthrosis and uncovertebral hypertrophy at multiple levels throughout the cervical spine. Disc osteophyte complexes at multiple levels. Mild spinal canal stenosis at C3-C4. There is at least moderate foraminal stenosis at multiple levels. SOFT TISSUES: No prevertebral soft tissue swelling. Similar  appearance of a partially visualized hypoattenuating mass at the right lung apex/superior mediastinum which measures up to 5.3 cm. Bilateral pleural calcifications. IMPRESSION: 1. Vertically oriented lucency through osteophytes at the C2 inferior endplate, concerning for fractured osteophyte. Consider MRI for further evaluation. 2. Anterior cervical fusion hardware spanning C3C7. 3. Degenerative changes as above. 4. Similar appearance of right lung apex/superior mediastinal mass. Electronically signed by: Donnice Mania MD 12/23/2023 01:42 PM EDT RP Workstation: [id number]   CT HEAD WO CONTRAST ( ) Result Date: 12/23/2023 EXAM: CT HEAD WITHOUT CONTRAST 12/23/2023 01:02:34 PM TECHNIQUE: CT of the head  was performed without the administration of intravenous contrast. Automated exposure control, iterative reconstruction, and/or weight based adjustment of the mA/kV was utilized to reduce the radiation dose to as low as reasonably achievable. COMPARISON: CT head 12/24/2021. CLINICAL HISTORY: Fall on DAPT, hypertensive, eval ICH. Pt arrived via EMS from home due to a fall. EMS states that pt was sitting on the edge of the bed and fell and hit his head and has a skin tear on the left arm. Pt has a hematoma to the left eyebrow. EMS states that pt has a systolic BP of 240s which per home health is pt baseline. Pt is blind. Pt does not take any blood thinners. FINDINGS: BRAIN AND VENTRICLES: No acute hemorrhage. No evidence of acute infarct. Patchy periventricular and subcortical white matter low-density changes compatible with chronic microvascular ischemic change. Chronic bilateral cerebellar infarcts. Moderate diffuse cerebral volume loss. No hydrocephalus. No extra-axial collection. No mass effect or midline shift. Atherosclerotic calcifications involving the large vessels of the skull base. ORBITS: Bilateral lens replacement. SINUSES: Complete opacification of the left frontal sinus, slightly increased from prior.  Scattered mucosal thickening in the ethmoid sinuses with near complete opacification of the left ethmoid sinus. Mucosal thickening in the visualized maxillary sinuses, slightly increased from prior. Complete opacification of the left sphenoid sinus with findings suggestive of chronic sinusitis. SOFT TISSUES AND SKULL: Left frontal scalp hematoma. Similar chronic postsurgical changes of the anterior frontal bones bilaterally. There is no evidence of acute calvarial fracture. IMPRESSION: 1. No acute intracranial abnormality. 2. Left frontal scalp hematoma. 3. Chronic bilateral cerebellar infarcts. 4. Chronic sinusitis as above. Overall, increased opacification of the sinuses compared to prior. Electronically signed by: Donnice Mania MD 12/23/2023 01:33 PM EDT RP Workstation: [id number]   VAS US  ABI WITH/WO TBI Result Date: 12/10/2023  LOWER EXTREMITY DOPPLER STUDY Patient Name:  DOREN KASPAR  Date of Exam:   12/07/2023 Medical Rec #: [id number]          Accession #:    [id number] Date of Birth: [birth year]         Patient Gender: M Patient Age:   89 years Exam Location:  [facility] Procedure:      VAS US  ABI WITH/WO TBI Referring Phys: --------------------------------------------------------------------------------  High Risk Factors: Hypertension, hyperlipidemia, past history of smoking,                    coronary artery disease, prior CVA.  Vascular Interventions: Coil embolization of right hypogastric artery for                         endoleak 10/29/2022. Performing Technologist: Donnice Charnley RVT  Examination Guidelines: A complete evaluation includes at minimum, Doppler waveform signals and systolic blood pressure reading at the level of bilateral brachial, anterior tibial, and posterior tibial arteries, when vessel segments are accessible. Bilateral testing is considered an integral part of a complete examination. Photoelectric Plethysmograph (PPG) waveforms and toe systolic pressure  readings are included as required and additional duplex testing as needed. Limited examinations for reoccurring indications may be performed as noted.  ABI Findings: +---------+------------------+-----+---------+--------+ Right    Rt Pressure (mmHg)IndexWaveform Comment  +---------+------------------+-----+---------+--------+ Brachial 187                                      +---------+------------------+-----+---------+--------+ PTA  biphasic          +---------+------------------+-----+---------+--------+ PERO     255               1.36 biphasic          +---------+------------------+-----+---------+--------+ DP       250               1.34 triphasic         +---------+------------------+-----+---------+--------+ Great Toe143               0.76                   +---------+------------------+-----+---------+--------+ +---------+------------------+-----+----------+-------+ Left     Lt Pressure (mmHg)IndexWaveform  Comment +---------+------------------+-----+----------+-------+ Brachial 182                                      +---------+------------------+-----+----------+-------+ PTA      186               0.99 biphasic          +---------+------------------+-----+----------+-------+ DP       250               1.34 monophasic        +---------+------------------+-----+----------+-------+ Great Toe93                0.50                   +---------+------------------+-----+----------+-------+ +-------+----------------+-----------+------------+------------+ ABI/TBIToday's ABI     Today's TBIPrevious ABIPrevious TBI +-------+----------------+-----------+------------+------------+ Right  Non compressible0.76                                +-------+----------------+-----------+------------+------------+ Left   Non compressible0.50                                 +-------+----------------+-----------+------------+------------+   Summary: Right: Resting right ankle-brachial index indicates noncompressible right lower extremity arteries. The right toe-brachial index is normal.  Left: Resting left ankle-brachial index indicates noncompressible left lower extremity arteries. The left toe-brachial index is abnormal.  *See table(s) above for measurements and observations.  Electronically signed by Selinda Gu MD on 12/10/2023 at 9:39:00 AM.    Final     Assessment and plan- Patient is a 89 y.o. male ***   Thank you for this kind referral and the opportunity to participate in the care of this  Patient   Visit Diagnosis 1. Fall, initial encounter   2. Traumatic hematoma of forehead, initial encounter   3. Acute respiratory failure with hypoxia (HCC)     Dr. Annah Skene, MD, MPH [facility] [phone] 12/24/2023

## 2023-12-26 DIAGNOSIS — Z7189 Other specified counseling: Secondary | ICD-10-CM

## 2023-12-26 DIAGNOSIS — C3491 Malignant neoplasm of unspecified part of right bronchus or lung: Secondary | ICD-10-CM

## 2024-01-08 DEATH — deceased

## 2024-03-21 ENCOUNTER — Other Ambulatory Visit (INDEPENDENT_AMBULATORY_CARE_PROVIDER_SITE_OTHER)

## 2024-03-21 ENCOUNTER — Ambulatory Visit (INDEPENDENT_AMBULATORY_CARE_PROVIDER_SITE_OTHER): Admitting: Vascular Surgery
